# Patient Record
Sex: Male | Born: 1935
Health system: Southern US, Community
[De-identification: ages and names within clinical notes are randomized; demographics above are authoritative.]

## PROBLEM LIST (undated history)

## (undated) DIAGNOSIS — M545 Low back pain, unspecified: Secondary | ICD-10-CM

## (undated) DIAGNOSIS — C61 Malignant neoplasm of prostate: Secondary | ICD-10-CM

## (undated) DIAGNOSIS — C801 Malignant (primary) neoplasm, unspecified: Secondary | ICD-10-CM

## (undated) DIAGNOSIS — J8410 Pulmonary fibrosis, unspecified: Secondary | ICD-10-CM

## (undated) DIAGNOSIS — K219 Gastro-esophageal reflux disease without esophagitis: Secondary | ICD-10-CM

## (undated) DIAGNOSIS — K635 Polyp of colon: Secondary | ICD-10-CM

## (undated) DIAGNOSIS — N183 Chronic kidney disease, stage 3 unspecified: Secondary | ICD-10-CM

## (undated) DIAGNOSIS — J449 Chronic obstructive pulmonary disease, unspecified: Secondary | ICD-10-CM

## (undated) DIAGNOSIS — G8929 Other chronic pain: Secondary | ICD-10-CM

## (undated) DIAGNOSIS — Z8719 Personal history of other diseases of the digestive system: Secondary | ICD-10-CM

## (undated) DIAGNOSIS — I10 Essential (primary) hypertension: Secondary | ICD-10-CM

## (undated) DIAGNOSIS — E785 Hyperlipidemia, unspecified: Secondary | ICD-10-CM

## (undated) DIAGNOSIS — I2510 Atherosclerotic heart disease of native coronary artery without angina pectoris: Secondary | ICD-10-CM

## (undated) DIAGNOSIS — M1990 Unspecified osteoarthritis, unspecified site: Secondary | ICD-10-CM

## (undated) HISTORY — PX: CARDIAC CATHETERIZATION: SHX172

## (undated) HISTORY — PX: JOINT REPLACEMENT: SHX530

## (undated) HISTORY — PX: TONSILLECTOMY: SUR1361

## (undated) HISTORY — PX: MOHS SURGERY: SUR867

---

## 2000-02-11 ENCOUNTER — Encounter: Payer: Self-pay | Admitting: Orthopaedic Surgery

## 2000-02-11 ENCOUNTER — Encounter: Admission: RE | Admit: 2000-02-11 | Discharge: 2000-02-11 | Payer: Self-pay | Admitting: Orthopaedic Surgery

## 2000-05-28 ENCOUNTER — Ambulatory Visit (HOSPITAL_COMMUNITY): Admission: RE | Admit: 2000-05-28 | Discharge: 2000-05-28 | Payer: Self-pay | Admitting: Gastroenterology

## 2005-10-29 ENCOUNTER — Encounter: Admission: RE | Admit: 2005-10-29 | Discharge: 2005-10-29 | Payer: Self-pay | Admitting: Orthopaedic Surgery

## 2005-10-29 ENCOUNTER — Ambulatory Visit (HOSPITAL_COMMUNITY): Admission: RE | Admit: 2005-10-29 | Discharge: 2005-10-29 | Payer: Self-pay | Admitting: Gastroenterology

## 2005-12-21 ENCOUNTER — Encounter: Admission: RE | Admit: 2005-12-21 | Discharge: 2005-12-21 | Payer: Self-pay | Admitting: Dermatology

## 2011-02-19 DIAGNOSIS — M169 Osteoarthritis of hip, unspecified: Secondary | ICD-10-CM | POA: Diagnosis not present

## 2011-02-19 DIAGNOSIS — M25559 Pain in unspecified hip: Secondary | ICD-10-CM | POA: Diagnosis not present

## 2011-03-17 DIAGNOSIS — Z6827 Body mass index (BMI) 27.0-27.9, adult: Secondary | ICD-10-CM | POA: Diagnosis not present

## 2011-03-17 DIAGNOSIS — M1909 Primary osteoarthritis, other specified site: Secondary | ICD-10-CM | POA: Diagnosis not present

## 2011-03-31 DIAGNOSIS — K5732 Diverticulitis of large intestine without perforation or abscess without bleeding: Secondary | ICD-10-CM | POA: Diagnosis not present

## 2011-03-31 DIAGNOSIS — E875 Hyperkalemia: Secondary | ICD-10-CM | POA: Diagnosis not present

## 2011-03-31 DIAGNOSIS — I10 Essential (primary) hypertension: Secondary | ICD-10-CM | POA: Diagnosis not present

## 2011-03-31 DIAGNOSIS — Z7901 Long term (current) use of anticoagulants: Secondary | ICD-10-CM | POA: Diagnosis not present

## 2011-03-31 DIAGNOSIS — J449 Chronic obstructive pulmonary disease, unspecified: Secondary | ICD-10-CM | POA: Diagnosis not present

## 2011-03-31 DIAGNOSIS — E785 Hyperlipidemia, unspecified: Secondary | ICD-10-CM | POA: Diagnosis not present

## 2011-03-31 DIAGNOSIS — N402 Nodular prostate without lower urinary tract symptoms: Secondary | ICD-10-CM | POA: Diagnosis not present

## 2011-04-07 DIAGNOSIS — R319 Hematuria, unspecified: Secondary | ICD-10-CM | POA: Diagnosis not present

## 2011-04-07 DIAGNOSIS — J449 Chronic obstructive pulmonary disease, unspecified: Secondary | ICD-10-CM | POA: Diagnosis not present

## 2011-04-07 DIAGNOSIS — Z6826 Body mass index (BMI) 26.0-26.9, adult: Secondary | ICD-10-CM | POA: Diagnosis not present

## 2011-04-07 DIAGNOSIS — E785 Hyperlipidemia, unspecified: Secondary | ICD-10-CM | POA: Diagnosis not present

## 2011-04-07 DIAGNOSIS — I10 Essential (primary) hypertension: Secondary | ICD-10-CM | POA: Diagnosis not present

## 2011-04-07 DIAGNOSIS — M1909 Primary osteoarthritis, other specified site: Secondary | ICD-10-CM | POA: Diagnosis not present

## 2011-04-30 DIAGNOSIS — L259 Unspecified contact dermatitis, unspecified cause: Secondary | ICD-10-CM | POA: Diagnosis not present

## 2011-04-30 DIAGNOSIS — L821 Other seborrheic keratosis: Secondary | ICD-10-CM | POA: Diagnosis not present

## 2011-04-30 DIAGNOSIS — Z85828 Personal history of other malignant neoplasm of skin: Secondary | ICD-10-CM | POA: Diagnosis not present

## 2011-04-30 DIAGNOSIS — D485 Neoplasm of uncertain behavior of skin: Secondary | ICD-10-CM | POA: Diagnosis not present

## 2011-04-30 DIAGNOSIS — D044 Carcinoma in situ of skin of scalp and neck: Secondary | ICD-10-CM | POA: Diagnosis not present

## 2011-04-30 DIAGNOSIS — L570 Actinic keratosis: Secondary | ICD-10-CM | POA: Diagnosis not present

## 2011-04-30 DIAGNOSIS — D0470 Carcinoma in situ of skin of unspecified lower limb, including hip: Secondary | ICD-10-CM | POA: Diagnosis not present

## 2011-05-01 DIAGNOSIS — M1610 Unilateral primary osteoarthritis, unspecified hip: Secondary | ICD-10-CM | POA: Diagnosis not present

## 2011-05-01 DIAGNOSIS — I10 Essential (primary) hypertension: Secondary | ICD-10-CM | POA: Diagnosis present

## 2011-05-01 DIAGNOSIS — J449 Chronic obstructive pulmonary disease, unspecified: Secondary | ICD-10-CM | POA: Insufficient documentation

## 2011-05-01 DIAGNOSIS — M169 Osteoarthritis of hip, unspecified: Secondary | ICD-10-CM | POA: Diagnosis present

## 2011-05-01 DIAGNOSIS — N189 Chronic kidney disease, unspecified: Secondary | ICD-10-CM | POA: Diagnosis present

## 2011-05-01 DIAGNOSIS — C44300 Unspecified malignant neoplasm of skin of unspecified part of face: Secondary | ICD-10-CM | POA: Diagnosis not present

## 2011-05-12 ENCOUNTER — Encounter (HOSPITAL_COMMUNITY): Payer: Self-pay | Admitting: Pharmacy Technician

## 2011-05-13 ENCOUNTER — Other Ambulatory Visit (HOSPITAL_COMMUNITY): Payer: Self-pay

## 2011-05-22 NOTE — Pre-Procedure Instructions (Addendum)
20 Marc Gilbert  05/22/2011   Your procedure is scheduled on:  April 9 @ 0730  Report to [facility] at 0530 AM.  Call this number if you have problems the morning of surgery: [phone]   Remember:   Do not eat food:After Midnight.  May have clear liquids: up to 4 Hours before arrival.130 am  Clear liquids include soda, tea, black coffee, apple or grape juice, broth.  Take these medicines the morning of surgery with A SIP OF WATER tamulosin, tramadol  STOP aspirin, fish oil, multi vit, naproxen now   Do not wear jewelry, make-up or nail polish.  Do not wear lotions, powders, or perfumes. You may wear deodorant.  Do not shave 48 hours prior to surgery.  Do not bring valuables to the hospital.  Contacts, dentures or bridgework may not be worn into surgery.  Leave suitcase in the car. After surgery it may be brought to your room.  For patients admitted to the hospital, checkout time is 11:00 AM the day of discharge.   Patients discharged the day of surgery will not be allowed to drive home.  Name and phone number of your driver: [name] [phone]  Special Instructions: CHG Shower Use Special Wash: 1/2 bottle night before surgery and 1/2 bottle morning of surgery.   Please read over the following fact sheets that you were given: Pain Booklet, Coughing and Deep Breathing, Blood Transfusion Information, Total Joint Packet, MRSA Information and Surgical Site Infection Prevention

## 2011-05-25 ENCOUNTER — Encounter (HOSPITAL_COMMUNITY): Payer: Self-pay

## 2011-05-25 ENCOUNTER — Encounter (HOSPITAL_COMMUNITY)
Admission: RE | Admit: 2011-05-25 | Discharge: 2011-05-25 | Disposition: A | Discharge status: Home / Self Care | Payer: Medicare Other | Source: Ambulatory Visit | Attending: Orthopaedic Surgery | Admitting: Orthopaedic Surgery

## 2011-05-25 HISTORY — DX: Malignant (primary) neoplasm, unspecified: C80.1

## 2011-05-25 HISTORY — DX: Gastro-esophageal reflux disease without esophagitis: K21.9

## 2011-05-25 HISTORY — DX: Personal history of other diseases of the digestive system: Z87.19

## 2011-05-25 HISTORY — DX: Chronic obstructive pulmonary disease, unspecified: J44.9

## 2011-05-25 HISTORY — DX: Unspecified osteoarthritis, unspecified site: M19.90

## 2011-05-25 LAB — TYPE AND SCREEN
ABO/RH(D): A POS
Antibody Screen: NEGATIVE

## 2011-05-25 LAB — DIFFERENTIAL
Basophils Absolute: 0.1 10*3/uL (ref 0.0–0.1)
Basophils Relative: 1 % (ref 0–1)
Eosinophils Absolute: 0.1 10*3/uL (ref 0.0–0.7)
Eosinophils Relative: 2 % (ref 0–5)
Lymphocytes Relative: 29 % (ref 12–46)
Lymphs Abs: 1.4 10*3/uL (ref 0.7–4.0)
Monocytes Absolute: 0.7 10*3/uL (ref 0.1–1.0)
Monocytes Relative: 14 % — ABNORMAL HIGH (ref 3–12)
Neutro Abs: 2.6 10*3/uL (ref 1.7–7.7)
Neutrophils Relative %: 53 % (ref 43–77)

## 2011-05-25 LAB — URINALYSIS, ROUTINE W REFLEX MICROSCOPIC
Bilirubin Urine: NEGATIVE
Glucose, UA: NEGATIVE mg/dL
Hgb urine dipstick: NEGATIVE
Ketones, ur: NEGATIVE mg/dL
Leukocytes, UA: NEGATIVE
Nitrite: NEGATIVE
Protein, ur: NEGATIVE mg/dL
Specific Gravity, Urine: 1.021 (ref 1.005–1.030)
Urobilinogen, UA: 0.2 mg/dL (ref 0.0–1.0)
pH: 5.5 (ref 5.0–8.0)

## 2011-05-25 LAB — COMPREHENSIVE METABOLIC PANEL
ALT: 20 U/L (ref 0–53)
AST: 20 U/L (ref 0–37)
Albumin: 3.8 g/dL (ref 3.5–5.2)
Alkaline Phosphatase: 58 U/L (ref 39–117)
BUN: 23 mg/dL (ref 6–23)
CO2: 28 mEq/L (ref 19–32)
Calcium: 9.7 mg/dL (ref 8.4–10.5)
Chloride: 104 mEq/L (ref 96–112)
Creatinine, Ser: 1.26 mg/dL (ref 0.50–1.35)
GFR calc Af Amer: 61 mL/min — ABNORMAL LOW (ref >90)
GFR calc non Af Amer: 53 mL/min — ABNORMAL LOW (ref >90)
Glucose, Bld: 99 mg/dL (ref 70–99)
Potassium: 4.7 mEq/L (ref 3.5–5.1)
Sodium: 140 mEq/L (ref 135–145)
Total Bilirubin: 0.3 mg/dL (ref 0.3–1.2)
Total Protein: 7.4 g/dL (ref 6.0–8.3)

## 2011-05-25 LAB — CBC
HCT: 44.3 % (ref 39.0–52.0)
Hemoglobin: 15 g/dL (ref 13.0–17.0)
MCH: 30 pg (ref 26.0–34.0)
MCHC: 33.9 g/dL (ref 30.0–36.0)
MCV: 88.6 fL (ref 78.0–100.0)
Platelets: 147 10*3/uL — ABNORMAL LOW (ref 150–400)
RBC: 5 MIL/uL (ref 4.22–5.81)
RDW: 13.1 % (ref 11.5–15.5)
WBC: 4.9 10*3/uL (ref 4.0–10.5)

## 2011-05-25 LAB — APTT: aPTT: 29 seconds (ref 24–37)

## 2011-05-25 LAB — SURGICAL PCR SCREEN
MRSA, PCR: NEGATIVE
Staphylococcus aureus: NEGATIVE

## 2011-05-25 LAB — ABO/RH: ABO/RH(D): A POS

## 2011-05-25 LAB — PROTIME-INR
INR: 1.06 (ref 0.00–1.49)
Prothrombin Time: 14 seconds (ref 11.6–15.2)

## 2011-05-25 MED ORDER — CHLORHEXIDINE GLUCONATE 4 % EX LIQD: 60.0000 mL | Freq: Once | CUTANEOUS | Status: DC

## 2011-05-25 MED ORDER — ACETAMINOPHEN 10 MG/ML IV SOLN
1000.0000 mg | Freq: Once | INTRAVENOUS | Status: DC
  Filled 2011-05-25: qty 100

## 2011-05-25 MED ORDER — CHLORHEXIDINE GLUCONATE 4 % EX LIQD: 60.0000 mL | Freq: Every day | CUTANEOUS | Status: DC

## 2011-05-25 NOTE — H&P (Signed)
COMPLAINT:  Painful right hip.  HPI:  Marc Gilbert is a very pleasant 78 year old white male who is seen today for evaluation of his right hip.  He has been noted to have a deformed femoral head and considerable degenerative arthritis of the right hip in the past.  It has been progressive to the point now where he needs to use a cane for ambulation.  He is now starting to develop even some back pain secondary to his limp.  He is able to sit and is somewhat comfortable.  He does have difficulty with ambulation as well as sleeping and certainly difficulty with riding in a car.  He states his pain is constant, severe, and sharp as well as with a dull ache.  He has noticed weakness secondary to the pain.  He does have occasional problems with sleep.  Rest has been helpful.  He has been using tramadol 50 mg t.i.d. for his pain.  He is seen today for reevaluation.  MEDICATIONS:  Pravachol 40 mg daily, losartan 12.5 mg daily, tamsulosin 0.4 mg daily, and tramadol 50 mg t.i.d.  He does take an 81 mg aspirin daily as well as a multivitamin and fish oil 1,200 t.i.d.  ALLERGIES:  NKDA.  PAST MEDICAL HISTORY:   Reveals three Mohs procedures for carcinoma of the skin.  He had squamous as well as basal cell.  He had the face done in 2009 and the left ankle done in 2011 and 2012.    ROS:  Fourteen point review of systems is positive for the use of glasses as well as bilateral decreased hearing.  He has been given the diagnosis of COPD.  He states, though, that he does not have that much of a shortness of breath.  He has also had a history of hypertension for three to four years and is presently on medication, that of 12.5 mg losartan.  He does have history of hemorrhoids without hematochezia.  He does have easy bruising probably secondary to his aspirin.  Again he has had basal and squamous cell skin cancer in the past.  He has seen a nephrologist because of increase in his creatinine.  It is now borderline increased at 1.5.   This had been over the last 2-1/2 to 3 years.  He does have frequent urination during the day and also nocturia two to three times per evening.  FAMILY HISTORY:  Mother died age 58 from incarcerated hernia.  Father died age 35 from a  myriad of problems including that of diabetes mellitus, COPD, and renal failure.  He has no siblings.  SOCIAL HISTORY:  He is a 27 year old white married male who is retired from [employer].  He quit smoking cigarettes in 1990 and prior to that he was smoking less than a pack a day for 36 years.  He states that he does drink daily, one to two drinks a day of beer, wine, or whiskey.  EXAM:  Examination today reveals a 78 year old white male who is well developed, well nourished, alert, pleasant, and cooperative.  He is in moderate distress secondary to right hip and groin pain.  HT:  6'0"     WT:  190 pounds  BMI:  25.8   TEMP:  98.2 degrees     BP:  135/90     Heart rate:  78     R:  16  Head normocephalic.   Eyes PERRLA.  EOMs intact.   Ears, nose and throat were benign. Neck supple,  no carotid bruits are noted.   Chest had good expansion.   Lungs had good breath sounds bilaterally. Cardiac had regular rhythm and rate, normal S1-S2, no murmurs noted. Abdomen scaphoid soft, nontender, no masses palpable, normal bowel sounds present. Genital, rectal, and breast exam not indicated for the orthopedic procedure. CNS oriented x 2, cranial nerves 2-12 grossly intact. Musculoskeletal reveals very limited range of motion of the right hip, especially with that of internal and external rotation.  I am able to get him to about 100 degrees of flexion, but he externally rotates to get to that position.  He is neurovascularly intact distally.  Calf is supple and nontender.  He does have good dorsalis pedis pulse at 1+.  IMPRESSION:   1. Osteoarthritis end-stage, right hip. 2. History of hypertension. 3. History of COPD. 4. History of hemorrhoids. 5. History of increased  creatinine levels. 6. Nocturia. 7. History of basal and squamous cell skin carcinoma.  RECOMMENDATIONS:      1. At this time, I have reviewed chart and paperwork from his physicians in [place] and it is their consensus that he is able to proceed with total hip replacement on the right. 2. Therefore, our plan is to schedule for a right total hip arthroplasty.  The procedure risks and benefits have been fully explained to him and all questions answered.  I discussed the hospital course with him.  We also know that he will need to consider skilled nursing facility postop.  He is attuned to all these.  Oris Drone Santiago Bumpers, PA-C 05/25/2011 8:24 PM

## 2011-05-25 NOTE — Progress Notes (Addendum)
Dr dalstedt urologist here in [place], dr Ezzard Standing in [place], dr Meriel Flavors in [place]  [phone] notes on chart from dr Sandi Carne ekg cxr on chart

## 2011-05-26 ENCOUNTER — Encounter (HOSPITAL_COMMUNITY): Payer: Self-pay | Admitting: *Deleted

## 2011-05-26 ENCOUNTER — Inpatient Hospital Stay (HOSPITAL_COMMUNITY)
Admission: RE | Admit: 2011-05-26 | Discharge: 2011-05-29 | DRG: 470 | Disposition: A | Discharge status: Skilled Nursing Facility | Payer: Medicare Other | Source: Ambulatory Visit | Attending: Orthopaedic Surgery | Admitting: Orthopaedic Surgery

## 2011-05-26 ENCOUNTER — Ambulatory Visit (HOSPITAL_COMMUNITY): Payer: Medicare Other

## 2011-05-26 ENCOUNTER — Ambulatory Visit (HOSPITAL_COMMUNITY): Payer: Medicare Other | Admitting: *Deleted

## 2011-05-26 ENCOUNTER — Encounter (HOSPITAL_COMMUNITY)
Admission: RE | Disposition: A | Discharge status: Skilled Nursing Facility | Payer: Self-pay | Source: Ambulatory Visit | Attending: Orthopaedic Surgery

## 2011-05-26 DIAGNOSIS — D62 Acute posthemorrhagic anemia: Secondary | ICD-10-CM | POA: Diagnosis not present

## 2011-05-26 DIAGNOSIS — I10 Essential (primary) hypertension: Secondary | ICD-10-CM | POA: Diagnosis present

## 2011-05-26 DIAGNOSIS — K219 Gastro-esophageal reflux disease without esophagitis: Secondary | ICD-10-CM | POA: Diagnosis not present

## 2011-05-26 DIAGNOSIS — Z01812 Encounter for preprocedural laboratory examination: Secondary | ICD-10-CM | POA: Diagnosis not present

## 2011-05-26 DIAGNOSIS — M1610 Unilateral primary osteoarthritis, unspecified hip: Secondary | ICD-10-CM | POA: Diagnosis not present

## 2011-05-26 DIAGNOSIS — C44300 Unspecified malignant neoplasm of skin of unspecified part of face: Secondary | ICD-10-CM | POA: Diagnosis not present

## 2011-05-26 DIAGNOSIS — Z87891 Personal history of nicotine dependence: Secondary | ICD-10-CM

## 2011-05-26 DIAGNOSIS — K5903 Drug induced constipation: Secondary | ICD-10-CM | POA: Diagnosis not present

## 2011-05-26 DIAGNOSIS — Z79899 Other long term (current) drug therapy: Secondary | ICD-10-CM | POA: Diagnosis not present

## 2011-05-26 DIAGNOSIS — Z85828 Personal history of other malignant neoplasm of skin: Secondary | ICD-10-CM | POA: Diagnosis not present

## 2011-05-26 DIAGNOSIS — M169 Osteoarthritis of hip, unspecified: Secondary | ICD-10-CM | POA: Diagnosis present

## 2011-05-26 DIAGNOSIS — Z5189 Encounter for other specified aftercare: Secondary | ICD-10-CM | POA: Diagnosis not present

## 2011-05-26 DIAGNOSIS — J449 Chronic obstructive pulmonary disease, unspecified: Secondary | ICD-10-CM | POA: Diagnosis not present

## 2011-05-26 DIAGNOSIS — T40605A Adverse effect of unspecified narcotics, initial encounter: Secondary | ICD-10-CM | POA: Diagnosis not present

## 2011-05-26 DIAGNOSIS — K5909 Other constipation: Secondary | ICD-10-CM | POA: Diagnosis not present

## 2011-05-26 DIAGNOSIS — Z96649 Presence of unspecified artificial hip joint: Secondary | ICD-10-CM | POA: Diagnosis not present

## 2011-05-26 DIAGNOSIS — I129 Hypertensive chronic kidney disease with stage 1 through stage 4 chronic kidney disease, or unspecified chronic kidney disease: Secondary | ICD-10-CM | POA: Diagnosis present

## 2011-05-26 DIAGNOSIS — M1990 Unspecified osteoarthritis, unspecified site: Secondary | ICD-10-CM | POA: Diagnosis not present

## 2011-05-26 DIAGNOSIS — Z7982 Long term (current) use of aspirin: Secondary | ICD-10-CM

## 2011-05-26 DIAGNOSIS — Z471 Aftercare following joint replacement surgery: Secondary | ICD-10-CM | POA: Diagnosis not present

## 2011-05-26 DIAGNOSIS — E785 Hyperlipidemia, unspecified: Secondary | ICD-10-CM | POA: Diagnosis not present

## 2011-05-26 DIAGNOSIS — M25559 Pain in unspecified hip: Secondary | ICD-10-CM | POA: Diagnosis not present

## 2011-05-26 DIAGNOSIS — J4489 Other specified chronic obstructive pulmonary disease: Secondary | ICD-10-CM | POA: Diagnosis not present

## 2011-05-26 DIAGNOSIS — N189 Chronic kidney disease, unspecified: Secondary | ICD-10-CM | POA: Diagnosis present

## 2011-05-26 DIAGNOSIS — M129 Arthropathy, unspecified: Secondary | ICD-10-CM | POA: Diagnosis not present

## 2011-05-26 HISTORY — PX: TOTAL HIP ARTHROPLASTY: SHX124

## 2011-05-26 LAB — BASIC METABOLIC PANEL
BUN: 22 mg/dL (ref 6–23)
CO2: 23 mEq/L (ref 19–32)
Calcium: 8 mg/dL — ABNORMAL LOW (ref 8.4–10.5)
Chloride: 108 mEq/L (ref 96–112)
Creatinine, Ser: 1.1 mg/dL (ref 0.50–1.35)
GFR calc Af Amer: 72 mL/min — ABNORMAL LOW (ref >90)
GFR calc non Af Amer: 62 mL/min — ABNORMAL LOW (ref >90)
Glucose, Bld: 165 mg/dL — ABNORMAL HIGH (ref 70–99)
Potassium: 5 mEq/L (ref 3.5–5.1)
Sodium: 138 mEq/L (ref 135–145)

## 2011-05-26 LAB — CBC
HCT: 32.3 % — ABNORMAL LOW (ref 39.0–52.0)
Hemoglobin: 10.9 g/dL — ABNORMAL LOW (ref 13.0–17.0)
MCH: 29.8 pg (ref 26.0–34.0)
MCHC: 33.7 g/dL (ref 30.0–36.0)
MCV: 88.3 fL (ref 78.0–100.0)
Platelets: 131 10*3/uL — ABNORMAL LOW (ref 150–400)
RBC: 3.66 MIL/uL — ABNORMAL LOW (ref 4.22–5.81)
RDW: 12.9 % (ref 11.5–15.5)
WBC: 6.4 10*3/uL (ref 4.0–10.5)

## 2011-05-26 SURGERY — ARTHROPLASTY, HIP, TOTAL,POSTERIOR APPROACH
Anesthesia: General | Site: Hip | Laterality: Right | Wound class: Clean

## 2011-05-26 MED ORDER — CEFAZOLIN SODIUM 1-5 GM-% IV SOLN
INTRAVENOUS | Status: AC
  Filled 2011-05-26: qty 100

## 2011-05-26 MED ORDER — METHOCARBAMOL 500 MG PO TABS
500.0000 mg | ORAL_TABLET | Freq: Four times a day (QID) | ORAL | Status: DC | PRN
  Administered 2011-05-27: 500 mg via ORAL
  Filled 2011-05-26: qty 1

## 2011-05-26 MED ORDER — METHOCARBAMOL 100 MG/ML IJ SOLN
500.0000 mg | INTRAVENOUS | Status: AC
  Administered 2011-05-26: 500 mg via INTRAVENOUS
  Filled 2011-05-26: qty 5

## 2011-05-26 MED ORDER — SODIUM CHLORIDE 0.9 % IR SOLN
Status: DC | PRN
  Administered 2011-05-26: 1000 mL

## 2011-05-26 MED ORDER — CEFAZOLIN SODIUM 1-5 GM-% IV SOLN
INTRAVENOUS | Status: DC | PRN
  Administered 2011-05-26: 2 g via INTRAVENOUS

## 2011-05-26 MED ORDER — ONDANSETRON HCL 4 MG/2ML IJ SOLN: 4.0000 mg | Freq: Four times a day (QID) | INTRAMUSCULAR | Status: DC | PRN

## 2011-05-26 MED ORDER — KETOROLAC TROMETHAMINE 15 MG/ML IJ SOLN: 15.0000 mg | Freq: Four times a day (QID) | INTRAMUSCULAR | Status: DC

## 2011-05-26 MED ORDER — LOSARTAN POTASSIUM 50 MG PO TABS
50.0000 mg | ORAL_TABLET | Freq: Every day | ORAL | Status: DC
  Administered 2011-05-28: 50 mg via ORAL
  Filled 2011-05-26 (×4): qty 1

## 2011-05-26 MED ORDER — MENTHOL 3 MG MT LOZG: 1.0000 | LOZENGE | OROMUCOSAL | Status: DC | PRN

## 2011-05-26 MED ORDER — DOCUSATE SODIUM 100 MG PO CAPS
100.0000 mg | ORAL_CAPSULE | Freq: Two times a day (BID) | ORAL | Status: DC
  Administered 2011-05-26 – 2011-05-29 (×7): 100 mg via ORAL
  Filled 2011-05-26 (×7): qty 1

## 2011-05-26 MED ORDER — ONDANSETRON HCL 4 MG/2ML IJ SOLN
INTRAMUSCULAR | Status: DC | PRN
  Administered 2011-05-26 (×2): 2 mg via INTRAVENOUS

## 2011-05-26 MED ORDER — CEFAZOLIN SODIUM 1-5 GM-% IV SOLN
1.0000 g | Freq: Four times a day (QID) | INTRAVENOUS | Status: AC
  Administered 2011-05-26 – 2011-05-27 (×3): 1 g via INTRAVENOUS
  Filled 2011-05-26 (×3): qty 50

## 2011-05-26 MED ORDER — RIVAROXABAN 10 MG PO TABS
10.0000 mg | ORAL_TABLET | ORAL | Status: DC
  Administered 2011-05-26 – 2011-05-28 (×3): 10 mg via ORAL
  Filled 2011-05-26 (×4): qty 1

## 2011-05-26 MED ORDER — ACETAMINOPHEN 10 MG/ML IV SOLN
1000.0000 mg | Freq: Four times a day (QID) | INTRAVENOUS | Status: AC
  Administered 2011-05-26 – 2011-05-27 (×4): 1000 mg via INTRAVENOUS
  Filled 2011-05-26 (×4): qty 100

## 2011-05-26 MED ORDER — PROPOFOL 10 MG/ML IV EMUL
INTRAVENOUS | Status: DC | PRN
  Administered 2011-05-26: 150 mg via INTRAVENOUS
  Administered 2011-05-26: 25 mg via INTRAVENOUS

## 2011-05-26 MED ORDER — PROMETHAZINE HCL 25 MG/ML IJ SOLN: 6.2500 mg | INTRAMUSCULAR | Status: DC | PRN

## 2011-05-26 MED ORDER — MEPERIDINE HCL 25 MG/ML IJ SOLN: 6.2500 mg | INTRAMUSCULAR | Status: DC | PRN

## 2011-05-26 MED ORDER — TAMSULOSIN HCL 0.4 MG PO CAPS
0.4000 mg | ORAL_CAPSULE | Freq: Every day | ORAL | Status: DC
  Administered 2011-05-26: 0.4 mg via ORAL
  Filled 2011-05-26 (×2): qty 1

## 2011-05-26 MED ORDER — DEXAMETHASONE SODIUM PHOSPHATE 4 MG/ML IJ SOLN
INTRAMUSCULAR | Status: DC | PRN
  Administered 2011-05-26: 4 mg via INTRAVENOUS

## 2011-05-26 MED ORDER — FENTANYL CITRATE 0.05 MG/ML IJ SOLN
INTRAMUSCULAR | Status: DC | PRN
  Administered 2011-05-26 (×3): 50 ug via INTRAVENOUS
  Administered 2011-05-26 (×2): 100 ug via INTRAVENOUS

## 2011-05-26 MED ORDER — ONDANSETRON HCL 4 MG/2ML IJ SOLN
INTRAMUSCULAR | Status: DC | PRN
  Administered 2011-05-26: 4 mg via INTRAVENOUS

## 2011-05-26 MED ORDER — HETASTARCH-ELECTROLYTES 6 % IV SOLN
INTRAVENOUS | Status: DC | PRN
  Administered 2011-05-26: 09:00:00 via INTRAVENOUS

## 2011-05-26 MED ORDER — MIDAZOLAM HCL 5 MG/5ML IJ SOLN
INTRAMUSCULAR | Status: DC | PRN
  Administered 2011-05-26: 1 mg via INTRAVENOUS

## 2011-05-26 MED ORDER — PHENOL 1.4 % MT LIQD: 1.0000 | OROMUCOSAL | Status: DC | PRN

## 2011-05-26 MED ORDER — HYDROMORPHONE HCL PF 1 MG/ML IJ SOLN
0.2500 mg | INTRAMUSCULAR | Status: DC | PRN
  Administered 2011-05-26 (×3): 0.5 mg via INTRAVENOUS

## 2011-05-26 MED ORDER — LACTATED RINGERS IV SOLN
INTRAVENOUS | Status: DC | PRN
  Administered 2011-05-26 (×4): via INTRAVENOUS

## 2011-05-26 MED ORDER — ONDANSETRON HCL 4 MG PO TABS: 4.0000 mg | ORAL_TABLET | Freq: Four times a day (QID) | ORAL | Status: DC | PRN

## 2011-05-26 MED ORDER — OXYCODONE HCL 5 MG PO TABS
5.0000 mg | ORAL_TABLET | ORAL | Status: DC | PRN
  Administered 2011-05-26 – 2011-05-29 (×8): 10 mg via ORAL
  Filled 2011-05-26 (×8): qty 2

## 2011-05-26 MED ORDER — EPHEDRINE SULFATE 50 MG/ML IJ SOLN
INTRAMUSCULAR | Status: DC | PRN
  Administered 2011-05-26 (×2): 5 mg via INTRAVENOUS

## 2011-05-26 MED ORDER — GLYCOPYRROLATE 0.2 MG/ML IJ SOLN
INTRAMUSCULAR | Status: DC | PRN
  Administered 2011-05-26: 0.4 mg via INTRAVENOUS

## 2011-05-26 MED ORDER — BUPIVACAINE-EPINEPHRINE PF 0.25-1:200000 % IJ SOLN
INTRAMUSCULAR | Status: DC | PRN
  Administered 2011-05-26: 20 mL

## 2011-05-26 MED ORDER — NEOSTIGMINE METHYLSULFATE 1 MG/ML IJ SOLN
INTRAMUSCULAR | Status: DC | PRN
  Administered 2011-05-26: 3 mg via INTRAVENOUS

## 2011-05-26 MED ORDER — ROCURONIUM BROMIDE 100 MG/10ML IV SOLN
INTRAVENOUS | Status: DC | PRN
  Administered 2011-05-26: 10 mg via INTRAVENOUS
  Administered 2011-05-26: 50 mg via INTRAVENOUS
  Administered 2011-05-26: 10 mg via INTRAVENOUS

## 2011-05-26 MED ORDER — ACETAMINOPHEN 10 MG/ML IV SOLN
INTRAVENOUS | Status: DC | PRN
  Administered 2011-05-26: 1000 mg via INTRAVENOUS

## 2011-05-26 MED ORDER — HYDROCHLOROTHIAZIDE 12.5 MG PO CAPS
12.5000 mg | ORAL_CAPSULE | Freq: Every day | ORAL | Status: DC
  Administered 2011-05-28: 12.5 mg via ORAL
  Filled 2011-05-26 (×4): qty 1

## 2011-05-26 MED ORDER — DEXTROSE 5 % IV SOLN
500.0000 mg | Freq: Four times a day (QID) | INTRAVENOUS | Status: DC | PRN
  Filled 2011-05-26: qty 5

## 2011-05-26 MED ORDER — LOSARTAN POTASSIUM-HCTZ 100-25 MG PO TABS: 0.5000 | ORAL_TABLET | Freq: Every day | ORAL | Status: DC

## 2011-05-26 MED ORDER — HYDROMORPHONE HCL PF 1 MG/ML IJ SOLN
0.5000 mg | INTRAMUSCULAR | Status: DC | PRN
  Administered 2011-05-26: 1 mg via INTRAVENOUS
  Filled 2011-05-26: qty 1

## 2011-05-26 MED ORDER — SODIUM CHLORIDE 0.9 % IV SOLN: INTRAVENOUS | Status: DC

## 2011-05-26 MED ORDER — METOCLOPRAMIDE HCL 5 MG/ML IJ SOLN: 5.0000 mg | Freq: Three times a day (TID) | INTRAMUSCULAR | Status: DC | PRN

## 2011-05-26 MED ORDER — PHENYLEPHRINE HCL 10 MG/ML IJ SOLN
INTRAMUSCULAR | Status: DC | PRN
  Administered 2011-05-26: 20 ug via INTRAVENOUS
  Administered 2011-05-26: 40 ug via INTRAVENOUS

## 2011-05-26 MED ORDER — SODIUM CHLORIDE 0.9 % IV SOLN
75.0000 mL/h | INTRAVENOUS | Status: DC
  Administered 2011-05-26: 75 mL/h via INTRAVENOUS

## 2011-05-26 MED ORDER — ACETAMINOPHEN 10 MG/ML IV SOLN
INTRAVENOUS | Status: AC
  Filled 2011-05-26: qty 100

## 2011-05-26 MED ORDER — METOCLOPRAMIDE HCL 10 MG PO TABS: 5.0000 mg | ORAL_TABLET | Freq: Three times a day (TID) | ORAL | Status: DC | PRN

## 2011-05-26 SURGICAL SUPPLY — 56 items
BLADE SAW SAG 73X25 THK (BLADE) ×1
BLADE SAW SGTL 73X25 THK (BLADE) ×1 IMPLANT
BRUSH FEMORAL CANAL (MISCELLANEOUS) IMPLANT
CLOTH BEACON ORANGE TIMEOUT ST (SAFETY) ×2 IMPLANT
COVER BACK TABLE 24X17X13 BIG (DRAPES) ×2 IMPLANT
COVER SURGICAL LIGHT HANDLE (MISCELLANEOUS) ×2 IMPLANT
DRAPE INCISE IOBAN 66X45 STRL (DRAPES) ×4 IMPLANT
DRAPE ORTHO SPLIT 77X108 STRL (DRAPES) ×2
DRAPE SURG ORHT 6 SPLT 77X108 (DRAPES) ×2 IMPLANT
DRSG MEPILEX BORDER 4X12 (GAUZE/BANDAGES/DRESSINGS) ×2 IMPLANT
DRSG MEPILEX BORDER 4X8 (GAUZE/BANDAGES/DRESSINGS) ×2 IMPLANT
DURAPREP 26ML APPLICATOR (WOUND CARE) ×2 IMPLANT
ELECT BLADE 6.5 EXT (BLADE) IMPLANT
ELECT REM PT RETURN 9FT ADLT (ELECTROSURGICAL) ×2
ELECTRODE REM PT RTRN 9FT ADLT (ELECTROSURGICAL) ×1 IMPLANT
EVACUATOR 1/8 PVC DRAIN (DRAIN) ×2 IMPLANT
FACESHIELD LNG OPTICON STERILE (SAFETY) ×8 IMPLANT
GLOVE BIOGEL PI IND STRL 8 (GLOVE) ×1 IMPLANT
GLOVE BIOGEL PI IND STRL 8.5 (GLOVE) ×1 IMPLANT
GLOVE BIOGEL PI INDICATOR 8 (GLOVE) ×1
GLOVE BIOGEL PI INDICATOR 8.5 (GLOVE) ×1
GLOVE ECLIPSE 8.0 STRL XLNG CF (GLOVE) ×6 IMPLANT
GLOVE SURG ORTHO 8.5 STRL (GLOVE) ×6 IMPLANT
GOWN PREVENTION PLUS XLARGE (GOWN DISPOSABLE) ×4 IMPLANT
GOWN STRL NON-REIN LRG LVL3 (GOWN DISPOSABLE) ×4 IMPLANT
HANDPIECE INTERPULSE COAX TIP (DISPOSABLE)
IMMOBILIZER KNEE 20 (SOFTGOODS)
IMMOBILIZER KNEE 20 THIGH 36 (SOFTGOODS) IMPLANT
IMMOBILIZER KNEE 22 UNIV (SOFTGOODS) ×2 IMPLANT
IMMOBILIZER KNEE 24 THIGH 36 (MISCELLANEOUS) IMPLANT
IMMOBILIZER KNEE 24 UNIV (MISCELLANEOUS)
KIT BASIN OR (CUSTOM PROCEDURE TRAY) ×2 IMPLANT
KIT ROOM TURNOVER OR (KITS) ×2 IMPLANT
MANIFOLD NEPTUNE II (INSTRUMENTS) ×2 IMPLANT
NEEDLE 22X1 1/2 (OR ONLY) (NEEDLE) ×2 IMPLANT
NS IRRIG 1000ML POUR BTL (IV SOLUTION) ×2 IMPLANT
PACK TOTAL JOINT (CUSTOM PROCEDURE TRAY) ×2 IMPLANT
PAD ARMBOARD 7.5X6 YLW CONV (MISCELLANEOUS) ×4 IMPLANT
PRESSURIZER FEMORAL UNIV (MISCELLANEOUS) IMPLANT
SET HNDPC FAN SPRY TIP SCT (DISPOSABLE) IMPLANT
SPONGE LAP 18X18 X RAY DECT (DISPOSABLE) ×2 IMPLANT
STAPLER VISISTAT 35W (STAPLE) ×2 IMPLANT
SUCTION FRAZIER TIP 10 FR DISP (SUCTIONS) ×2 IMPLANT
SUT BONE WAX W31G (SUTURE) ×2 IMPLANT
SUT ETHIBOND NAB CT1 #1 30IN (SUTURE) ×6 IMPLANT
SUT MNCRL AB 3-0 PS2 18 (SUTURE) IMPLANT
SUT VIC AB 0 CT1 27 (SUTURE) ×3
SUT VIC AB 0 CT1 27XBRD ANBCTR (SUTURE) ×3 IMPLANT
SUT VIC AB 2-0 CT1 27 (SUTURE) ×1
SUT VIC AB 2-0 CT1 TAPERPNT 27 (SUTURE) ×1 IMPLANT
SYR CONTROL 10ML LL (SYRINGE) ×2 IMPLANT
TOWEL OR 17X24 6PK STRL BLUE (TOWEL DISPOSABLE) ×2 IMPLANT
TOWEL OR 17X26 10 PK STRL BLUE (TOWEL DISPOSABLE) ×2 IMPLANT
TOWER CARTRIDGE SMART MIX (DISPOSABLE) IMPLANT
TRAY FOLEY CATH 14FR (SET/KITS/TRAYS/PACK) ×2 IMPLANT
WATER STERILE IRR 1000ML POUR (IV SOLUTION) ×8 IMPLANT

## 2011-05-26 NOTE — Transfer of Care (Signed)
Immediate Anesthesia Transfer of Care Note  Patient: Marc Gilbert  Procedure(s) Performed: Procedure(s) (LRB): TOTAL HIP ARTHROPLASTY (Right)  Patient Location: PACU  Anesthesia Type: General  Level of Consciousness: awake, alert , oriented and patient cooperative  Airway & Oxygen Therapy: Patient Spontanous Breathing and Patient connected to face mask oxygen  Post-op Assessment: Report given to PACU RN, Post -op Vital signs reviewed and stable and Patient moving all extremities  Post vital signs: Reviewed and stable  Complications: No apparent anesthesia complications

## 2011-05-26 NOTE — Cardiovascular Report (Signed)
[name], [name] NO.:  [id number]  MEDICAL RECORD NO.:  [id number]  LOCATION:  [facility]                         FACILITY:  [facility]  PHYSICIAN:  Claude Manges. Emmerson Taddei, M.D.DATE OF BIRTH:  [birth year]  DATE OF PROCEDURE: DATE OF DISCHARGE:                           CARDIAC CATHETERIZATION   PREOPERATIVE DIAGNOSIS:  End-stage osteoarthritis, right hip.  POSTOPERATIVE DIAGNOSIS:  End-stage osteoarthritis, right hip.  PROCEDURE:  Right total hip replacement.  SURGEON:  Claude Manges. Cleophas Dunker, M.D.  ASSISTANT:  Arlys John D. Petrarca, P.A.-C.  ANESTHESIA:  General.  COMPLICATIONS:  None.  COMPONENTS:  DePuy AML 13.5 mm large stature femoral component, a 36 mm outer diameter hip ball with a +5 mm neck length, 58 mm outer diameter, 3 holes Gription acetabular component with marathon polyethylene liner +4 with 10-degree posterior lip.  Components were press-fit.  PROCEDURE:  [name] was met in the Holding Area, identified the right hip as the appropriate operative site.  The patient was then transported to room #1 and placed under general anesthesia without difficulty.  He was then placed in the lateral decubitus position with the right side up and secured to the operating room table with the Innomed hip system. Prior to positioning, the catheter was inserted.  The urine was clear.  The right hip was then prepped from the iliac crest to below the knee with Betadine scrub and then DuraPrep.  Sterile draping was performed.  A routine southern incision was utilized via sharp dissection carried down to subcutaneous tissue.  Gross bleeders were Bovie coagulated.  The superficial fascia, adipose tissue was incised to the level of the iliotibial band.  Self-retaining retractors were inserted.  The IT band was then incised.  Retractors were placed more deeply.  Any bleeding was controlled nicely with the Bovie.  With the hip internally rotated, the short external rotators  were identified.  Tendinous structures were tagged with 0 Ethibond sutures and then incised, the capsule was identified and incised on the femoral neck and head, there was part of 7 mL serosanguineous effusion.  The head was easily dislocated posteriorly, and then amputated about a fingerbreadth below the femoral head with an oscillating saw.  The head was then removed from the wound, it was misshapen, there was little if any articular cartilage remaining on the head. Retractors were then placed around the femoral neck.  A center hole was then made in the piriformis fossa.  A canal finder was then inserted, reaming was performed sequentially to 13 mm with 13.5 prosthesis. Rasping was performed sequentially to 13.5.  The osteotomy was about a fingerbreadth proximal to the lesser trochanter.  I had a very nice flush of osteotomy and a calcar reamer was used to obtain the final angle.  Retractors were then placed about the acetabulum.  I sharply excised the labrum.  I also removed circumferential synovitis.  Reaming was then performed sequentially to 58 mm.  I had nice bleeding bone circumferentially and no bony rim.  I trialed a 56 and trial 58 was still tight, but would not completely seat.  I reamed 58 as I was using the Gription acetabular cup.  Three-hole Gription cup was then impacted  what we thought was anatomic position, was nice and tight without any toggling.  The trial polyethylene bearing was then inserted followed by the 13.5 mm femoral component with a +5 neck length, 36 mm diameter hip ball.  The entire construct was then reduced and through a full range of motion, there was absolutely no toggling.  There was no instability despite even with the hip and over 90 degrees of flexion with abduction and internal rotation.  The hip was perfectly stable in extension.  The wound was irrigated with saline solution throughout the procedure. The trial components were removed.  I  again irrigated the acetabulum. The center of the apex hole eliminator was inserted followed by the +4, 10-degree posterior lip marathon polyethylene liner.  The 13.5 mm femoral component was then nicely impacted onto the calcar without problems.  We trialed the +5 neck length to 36 ball and again was perfectly stable.  The trial hip ball was removed.  The final 36 mm outer diameter ball was then impacted after cleaning the [facility] taper neck.  We irrigated the acetabulum, then reduced the head to a full range of motion.  We had perfect stability.  The wound was again irrigated with saline solution.  Capsule was closed anatomically with #1 Ethibond.  Short external rotators were closed with similar material.  The wound was again irrigated.  The iliotibial band closed with running 0 Vicryl subcu in several layers 0, 2-0 Vicryl, and then Monocryl in the subcuticular region.  Skin clips were applied.  Carefully placed the patient in the supine position, he was extubated, placed on the operating room stretcher and returned to the postanesthesia recovery room in satisfactory condition.     Claude Manges. Cleophas Dunker, M.D.     PWW/MEDQ  D:  05/26/2011  T:  05/26/2011  Job:  [id number]

## 2011-05-26 NOTE — Anesthesia Preprocedure Evaluation (Addendum)
Anesthesia Evaluation  Patient identified by MRN, date of birth, ID band Patient awake    Reviewed: Allergy & Precautions, H&P   Airway Mallampati: I  Neck ROM: Full    Dental  (+) Teeth Intact and Dental Advisory Given   Pulmonary COPD breath sounds clear to auscultation        Cardiovascular hypertension, Rhythm:Regular Rate:Normal     Neuro/Psych    GI/Hepatic   Endo/Other    Renal/GU      Musculoskeletal   Abdominal   Peds  Hematology   Anesthesia Other Findings   Reproductive/Obstetrics                          Anesthesia Physical Anesthesia Plan  ASA: II  Anesthesia Plan: General   Post-op Pain Management:    Induction: Intravenous  Airway Management Planned: Oral ETT  Additional Equipment:   Intra-op Plan:   Post-operative Plan: Extubation in OR  Informed Consent:   Dental advisory given  Plan Discussed with: CRNA and Surgeon  Anesthesia Plan Comments:        Anesthesia Quick Evaluation

## 2011-05-26 NOTE — Anesthesia Postprocedure Evaluation (Signed)
  Anesthesia Post-op Note  Patient: Marc Gilbert  Procedure(s) Performed: Procedure(s) (LRB): TOTAL HIP ARTHROPLASTY (Right)  Patient Location: PACU  Anesthesia Type: General  Level of Consciousness: awake  Airway and Oxygen Therapy: Patient Spontanous Breathing  Post-op Pain: mild  Post-op Assessment: Post-op Vital signs reviewed  Post-op Vital Signs: stable  Complications: No apparent anesthesia complications

## 2011-05-26 NOTE — Plan of Care (Signed)
Problem: Consults Goal: Diagnosis- Total Joint Replacement Primary Total Hip     

## 2011-05-26 NOTE — H&P (Signed)
There has been no change in health status since  the current H&P.I have examined the patient and discussed the surgery. No contraindications to the planned procedure exist.    There has been no change in health status since  the current H&P.I have examined the patient and discussed the surgery. No contraindications to the planned procedure exist.

## 2011-05-26 NOTE — Brief Op Note (Signed)
05/26/2011  9:50 AM  PATIENT:  Marc Gilbert  78 y.o. male  PRE-OPERATIVE DIAGNOSIS:  osteoarthritis right hip  POST-OPERATIVE DIAGNOSIS:  osteoarthritis right hip  PROCEDURE:  Procedure(s) (LRB): TOTAL HIP ARTHROPLASTY (Right)  SURGEON:  Surgeon(s) and Role:    * Valeria Batman, MD - Primary  PHYSICIAN ASSISTANT: [name], PA-   ANESTHESIA:   general  EBL:  Total I/O In: 3000 [I.V.:3000] Out: 1775 [Urine:275; Blood:1500]  BLOOD ADMINISTERED:none  DRAINS: none   LOCAL MEDICATIONS USED:  MARCAINE     SPECIMEN:  No Specimen  DISPOSITION OF SPECIMEN:  N/A  COUNTS:  YES  TOURNIQUET:  * No tourniquets in log * TION: .Other Dictation: Dictation Number [id number]  DICTA PLAN OF CARE: Admit to inpatient   PATIENT DISPOSITION:  PACU - hemodynamically stable.   Delay start of Pharmacological VTE agent (>24hrs) due to surgical blood loss or risk of bleeding: not applicable

## 2011-05-26 NOTE — Preoperative (Addendum)
Beta Blockers   Reason not to administer Beta Blockers:Not Applicable 

## 2011-05-27 LAB — BASIC METABOLIC PANEL
BUN: 17 mg/dL (ref 6–23)
CO2: 25 mEq/L (ref 19–32)
Calcium: 7.6 mg/dL — ABNORMAL LOW (ref 8.4–10.5)
Chloride: 107 mEq/L (ref 96–112)
Creatinine, Ser: 1.2 mg/dL (ref 0.50–1.35)
GFR calc Af Amer: 65 mL/min — ABNORMAL LOW (ref >90)
GFR calc non Af Amer: 56 mL/min — ABNORMAL LOW (ref >90)
Glucose, Bld: 102 mg/dL — ABNORMAL HIGH (ref 70–99)
Potassium: 3.9 mEq/L (ref 3.5–5.1)
Sodium: 139 mEq/L (ref 135–145)

## 2011-05-27 LAB — CBC
HCT: 27.5 % — ABNORMAL LOW (ref 39.0–52.0)
Hemoglobin: 9.3 g/dL — ABNORMAL LOW (ref 13.0–17.0)
MCH: 29.9 pg (ref 26.0–34.0)
MCHC: 33.8 g/dL (ref 30.0–36.0)
MCV: 88.4 fL (ref 78.0–100.0)
Platelets: 140 10*3/uL — ABNORMAL LOW (ref 150–400)
RBC: 3.11 MIL/uL — ABNORMAL LOW (ref 4.22–5.81)
RDW: 13.2 % (ref 11.5–15.5)
WBC: 6 10*3/uL (ref 4.0–10.5)

## 2011-05-27 LAB — URINE CULTURE
Colony Count: NO GROWTH
Culture  Setup Time: 201304090812
Culture: NO GROWTH

## 2011-05-27 MED ORDER — POLYETHYLENE GLYCOL 3350 17 G PO PACK: 17.0000 g | PACK | Freq: Every day | ORAL | Status: DC

## 2011-05-27 MED ORDER — ACETAMINOPHEN 10 MG/ML IV SOLN
1000.0000 mg | Freq: Four times a day (QID) | INTRAVENOUS | Status: DC
  Administered 2011-05-27: 1000 mg via INTRAVENOUS
  Filled 2011-05-27 (×4): qty 100

## 2011-05-27 MED ORDER — BISACODYL 10 MG RE SUPP: 10.0000 mg | Freq: Every day | RECTAL | Status: DC | PRN

## 2011-05-27 MED ORDER — POLYETHYLENE GLYCOL 3350 17 G PO PACK
17.0000 g | PACK | Freq: Every day | ORAL | Status: DC
  Administered 2011-05-27 – 2011-05-29 (×3): 17 g via ORAL
  Filled 2011-05-27 (×3): qty 1

## 2011-05-27 MED ORDER — TAMSULOSIN HCL 0.4 MG PO CAPS
0.4000 mg | ORAL_CAPSULE | Freq: Every day | ORAL | Status: DC
  Administered 2011-05-27 – 2011-05-28 (×2): 0.4 mg via ORAL
  Filled 2011-05-27 (×3): qty 1

## 2011-05-27 MED ORDER — FLEET ENEMA 7-19 GM/118ML RE ENEM: 1.0000 | ENEMA | Freq: Every day | RECTAL | Status: DC | PRN

## 2011-05-27 MED ORDER — ACETAMINOPHEN 500 MG PO TABS
1000.0000 mg | ORAL_TABLET | Freq: Four times a day (QID) | ORAL | Status: AC
  Administered 2011-05-28 (×2): 1000 mg via ORAL
  Filled 2011-05-27 (×2): qty 2

## 2011-05-27 NOTE — Progress Notes (Signed)
Patient ID: Marc Gilbert, male   DOB: 08-14-1930, 78 y.o.   MRN: [id number] PATIENT ID: Marc Gilbert        MRN:  [id number]          DOB/AGE: January 31, 1931 / 78 y.o.  [name], MD   [name], PA-C [address] [place], [place], [place]  [address]                             [phone]   PROGRESS NOTE  Subjective:  negative for Chest Pain  negative for Shortness of Breath  negative for Nausea/Vomiting   negative for Calf Pain  negative for Bowel Movement   Tolerating Diet: yes         Patient reports pain as mild.    Objective: Vital signs in last 24 hours:   Patient Vitals for the past 24 hrs:  BP Temp Pulse Resp SpO2  05/27/11 0601 119/60 mmHg 99.5 F (37.5 C) 92  18  99 %  05/26/11 2201 109/56 mmHg 98.1 F (36.7 C) 91  18  96 %  05/26/11 1510 105/62 mmHg 98.2 F (36.8 C) 98  16  98 %  05/26/11 1204 110/64 mmHg 98.3 F (36.8 C) 83  16  100 %  05/26/11 1135 118/58 mmHg 98 F (36.7 C) 91  15  100 %  05/26/11 1127 - - - 13  -  05/26/11 1122 106/51 mmHg - - - -  05/26/11 1121 - - 86  - 100 %  05/26/11 1115 - - 91  16  100 %  05/26/11 1107 113/57 mmHg - - - -  05/26/11 1100 - - 87  17  100 %  05/26/11 1052 132/60 mmHg - - - -  05/26/11 1041 - - 82  - 100 %  05/26/11 1037 131/56 mmHg - - - -  05/26/11 1036 - - - 16  -  05/26/11 1028 - - 76  17  99 %  05/26/11 1022 128/59 mmHg - - - -  05/26/11 1015 - - 83  22  100 %  05/26/11 1007 117/59 mmHg - - - -  05/26/11 1005 - 97.2 F (36.2 C) - - -      Intake/Output from previous day:   04/09 0701 - 04/10 0700 In: 4655 [P.O.:720; I.V.:3685] Out: 4575 [Urine:3075]   Intake/Output this shift:       Intake/Output      04/09 0701 - 04/10 0700 04/10 0701 - 04/11 0700   P.O. 720    I.V. 3685    IV Piggyback 250    Total Intake 4655    Urine 3075    Blood 1500    Total Output 4575    Net +80            LABORATORY DATA:  Basename 05/27/11 0500 05/26/11 1030 05/25/11 0935  WBC 6.0 6.4 4.9  HGB 9.3*  10.9* 15.0  HCT 27.5* 32.3* 44.3  PLT 140* 131* 147*    Basename 05/27/11 0500 05/26/11 1030 05/25/11 0935  NA 139 138 140  K 3.9 5.0 4.7  CL 107 108 104  CO2 25 23 28   BUN 17 22 23   CREATININE 1.20 1.10 1.26  GLUCOSE 102* 165* 99  CALCIUM 7.6* 8.0* 9.7   Lab Results  Component Value Date   INR 1.06 05/25/2011    Examination:  General appearance: alert, cooperative and no distress  Wound  Exam: clean, dry, intact   Drainage:  None: wound tissue dry  Motor Exam: EHL, FHL, Anterior Tibial and Posterior Tibial Intact  Sensory Exam: Superficial Peroneal, Deep Peroneal and Tibial normal  Vascular Exam: Normal  Assessment:    1 Day Post-Op  Procedure(s) (LRB): TOTAL HIP ARTHROPLASTY (Right)  ADDITIONAL DIAGNOSIS:  Principal Problem:  *COPD (chronic obstructive pulmonary disease) Active Problems:  Osteoarthritis of hip  Hypertension  CRF (chronic renal failure)  Cancer of skin, face  Acute Blood Loss Anemia   Plan: Physical Therapy as ordered Weight Bearing as Tolerated (WBAT)  DVT Prophylaxis:  Xarelto  DISCHARGE PLAN: Skilled Nursing Facility/Rehab  DISCHARGE NEEDS: Walker and 3-in-1 comode seat  Good night with minimal pain. Tolerating pain with po analgesics. Foley out.Using incentive respirometry. PT today.[facility] or IP rehab       [name] W 05/27/2011, 7:18 AM

## 2011-05-27 NOTE — Progress Notes (Signed)
Physical Therapy Evaluation Note  Past Medical History  Diagnosis Date  . COPD (chronic obstructive pulmonary disease)     told  had copd  . Chronic kidney disease     insuffiency  no change last 3 yrs  . GERD (gastroesophageal reflux disease)     occ  . H/O hiatal hernia   . Cancer     skin  squamous and basal  . Arthritis    . Past Surgical History  Procedure Date  . Tonsillectomy       05/27/11 0913  PT Visit Information  Last PT Received On 05/27/11  Precautions  Precautions Posterior Hip  Precaution Booklet Issued Yes (comment)  Precaution Comments pt and daughter with verbal understanding  Required Braces or Orthoses Yes  Knee Immobilizer (R LE when in bed)  Restrictions  RLE Weight Bearing WBAT  Home Living  Lives With Spouse  Type of Home House  Home Layout One level  Home Access Stairs to enter  Entrance Stairs-Rails Right (can reach both in front entrance)  Entrance Stairs-Number of Steps 3  Bathroom Shower/Tub Walk-in Pension scheme manager Yes  How Accessible Accessible via walker  Home Adaptive Equipment Built-in shower seat  Prior Function  Level of Independence Independent with basic ADLs;Independent with gait;Independent with transfers  Able to Take Stairs? Yes  Driving Yes  Vocation Retired  Comments Pt plans to go to rehab upon to d/c  Cognition  Arousal/Alertness Awake/alert  Overall Cognitive Status Appears within functional limits for tasks assessed  Orientation Level Oriented X4  Sensation  Light Touch Appears Intact  Bed Mobility  Bed Mobility Yes  Supine to Sit 3: Mod assist;HOB elevated (Comment degrees) (HOB 30 deg)  Supine to Sit Details (indicate cue type and reason) max directional verbal cues, modA at trunk and R LE management  Transfers  Transfers Yes  Sit to Stand 3: Mod assist;With upper extremity assist;From bed  Sit to Stand Details (indicate cue type and reason) max directional  verbal cues for hand placement, mod underarm assist on R to achieve full upright posture  Ambulation/Gait  Ambulation/Gait Yes  Ambulation/Gait Assistance 4: Min assist  Ambulation/Gait Assistance Details (indicate cue type and reason) v/c's for appropriate sequencing with RW  Ambulation Distance (Feet) 10 Feet  Assistive device Rolling walker  Gait Pattern Step-to pattern;Decreased step length - right;Decreased stance time - right;Antalgic  Gait velocity slow  Stairs No  Wheelchair Mobility  Wheelchair Mobility No  Posture/Postural Control  Posture/Postural Control No significant limitations  Balance  Balance Assessed No  RUE Assessment  RUE Assessment WFL  LUE Assessment  LUE Assessment WFL  RLE Assessment  RLE Assessment (pt tolerated approx 75 deg R hip AA flex)  LLE Assessment  LLE Assessment WFL  Cervical Assessment  Cervical Assessment [facility]  Thoracic Assessment  Thoracic Assessment WFL  Lumbar Assessment  Lumbar Assessment WFL  Exercises  Exercises Total Joint (provided hand out for HEP)  Total Joint Exercises  Ankle Circles/Pumps AROM;Both;10 reps;Supine  Quad Sets AROM;Right;10 reps;Supine  Gluteal Sets AROM;Both;10 reps;Supine  Heel Slides Right;AAROM;20 reps;Supine  PT - End of Session  Equipment Utilized During Treatment Gait belt  Activity Tolerance Patient tolerated treatment well  Patient left in chair;with call bell in reach;with family/visitor present  Nurse Communication Mobility status for transfers;Mobility status for ambulation  General  Behavior During Session [facility] for tasks performed  Cognition [facility] for tasks performed  PT Assessment  Clinical Impression Statement Pt s/p  R THA presenting with decreased R LE strength and R A/P ROM. Patient very motivated and doing very well. patient demonstrates tightness in R hip internal rotators. Pt aware and was provided with ther ex to assist in achieving R LE in neutral position.  PT Recommendation/Assessment  Patient will need skilled PT in the acute care venue  PT Problem List Decreased strength;Decreased range of motion;Decreased mobility;Decreased balance;Decreased activity tolerance  PT Therapy Diagnosis  Difficulty walking;Abnormality of gait;Generalized weakness;Acute pain  PT Plan  PT Frequency 7X/week  PT Treatment/Interventions DME instruction;Gait training;Stair training;Functional mobility training;Therapeutic activities;Therapeutic exercise  PT Recommendation  Follow Up Recommendations Skilled nursing facility;Supervision/Assistance - 24 hour. Per pt and daughter pt plans to go to [employer] Defer to next venue  Individuals Consulted  Consulted and Agree with Results and Recommendations Patient;Family member/caregiver  Family Member Consulted daughter  Acute Rehab PT Goals  PT Goal Formulation With patient/family  Time For Goal Achievement 7 days  Pt will go Supine/Side to Sit with modified independence;with HOB 0 degrees (while adhereing to post hip prec on R LE.)  PT Goal: Supine/Side to Sit - Progress Goal set today  Pt will go Sit to Supine/Side with modified independence (up to RW)  PT Goal: Sit to Supine/Side - Progress Goal set today  Pt will go Sit to Stand with modified independence  PT Goal: Sit to Stand - Progress Goal set today  Pt will Ambulate >150 feet;with modified independence;with rolling walker  PT Goal: Ambulate - Progress Goal set today  Pt will Go Up / Down Stairs 3-5 stairs;with min assist;with rail(s)  PT Goal: Up/Down Stairs - Progress Goal set today  Pt will Perform Home Exercise Program Independently  PT Goal: Perform Home Exercise Program - Progress Goal set today  Additional Goals  Additional Goal #1 Pt 100% compliant with R posterior hip precautions.  PT Goal: Additional Goal #1 - Progress Goal set today    Pain: 1/10 R hip at rest and 7-8/10 with R LE ROM/ther ex  Lewis Shock, PT, DPT Pager #: [phone] Office #:  [phone]

## 2011-05-27 NOTE — Progress Notes (Signed)
CARE MANAGEMENT NOTE 05/27/2011   Patient:  Marc Gilbert, Marc Gilbert   Account Number:  [id number]  Date Initiated:  05/27/2011  Documentation initiated by:  Vance Peper  Subjective/Objective Assessment:   78 yr old male s/p right total hip arthroplasty.     Action/Plan:   Patient preoperatively setup with [facility], but plans to go to SNF for shortterm rehab. Social worker aware.   Anticipated DC Date:  05/28/2011   Anticipated DC Plan:  SKILLED NURSING FACILITY      DC Planning Services  CM consult      PAC Choice  NA   Choice offered to / List presented to:     DME arranged  NA      DME agency  NA     HH arranged  NA      HH agency  NA   Status of service:  Completed, signed off  Discharge Disposition:  SKILLED NURSING FACILITY

## 2011-05-27 NOTE — Progress Notes (Signed)
Clinical Social Work Department BRIEF PSYCHOSOCIAL ASSESSMENT 05/27/2011  Patient:  Marc Gilbert, Marc Gilbert     Account Number:  [id number]     Admit date:  05/26/2011  Clinical Social Worker:  Peggyann Shoals  Date/Time:  05/27/2011 11:45 AM  Referred by:  Care Management  Date Referred:  05/27/2011 Referred for  SNF Placement   Other Referral:   Interview type:  Patient Other interview type:    PSYCHOSOCIAL DATA Living Status:  WIFE Admitted from facility:   Level of care:   Primary support name:  Marc Gilbert [phone] Primary support relationship to patient:  SPOUSE Degree of support available:   strong    CURRENT CONCERNS Current Concerns  Post-Acute Placement   Other Concerns:    SOCIAL WORK ASSESSMENT / PLAN Clinical Social Worker met with patient at the bedside to discuss plans and offer support.  Discussed PT recommendation for SNF placement for therapy.  Patient would like to receive therapy at [facility] upon discharge.  CSW left message with facility to find out about a bed offer for the patient.  CSW to facilitate patient discharge needs once patient medically ready for discharge.   Assessment/plan status:  Psychosocial Support/Ongoing Assessment of Needs Other assessment/ plan:   Discharge planning   Information/referral to community resources:   None at this time    PATIENT'S/FAMILY'S RESPONSE TO PLAN OF CARE: Patient alert and oriented x3.  Patient was pleasant and appreciative of Clinical Social Worker assistance.  Patient stated he had contacted [facility] and would like to receive therapy at their facility upon discharge.     Pt was assessed by MSW Intern, Marc Gilbert.   Dede Query, MSW, Theresia Majors [phone]

## 2011-05-27 NOTE — Progress Notes (Addendum)
Clinical Social Work Department CLINICAL SOCIAL WORK PLACEMENT NOTE 05/27/2011  Patient:  Marc Gilbert, Marc Gilbert  Account Number:  [id number] Admit date:  05/26/2011  Clinical Social Worker:  Peggyann Shoals  Date/time:  05/27/2011 11:45 AM  Clinical Social Work is seeking post-discharge placement for this patient at the following level of care:   SKILLED NURSING   (*CSW will update this form in Epic as items are completed)   05/27/2011  Patient/family provided with [facility] Department of Clinical Social Work's list of facilities offering this level of care within the geographic area requested by the patient (or if unable, by the patient's family).  05/27/2011  Patient/family informed of their freedom to choose among providers that offer the needed level of care, that participate in Medicare, Medicaid or managed care program needed by the patient, have an available bed and are willing to accept the patient.  05/27/2011  Patient/family informed of [facility]' ownership interest in [facility], as well as of the fact that they are under no obligation to receive care at this facility.  PASARR submitted to EDS on 05/27/2011 PASARR number received from EDS on 05/27/2011  FL2 transmitted to all facilities in geographic area requested by pt/family on  05/27/2011 FL2 transmitted to all facilities within larger geographic area on   Patient informed that his/her managed care company has contracts with or will negotiate with  certain facilities, including the following:     Patient/family informed of bed offers received:  05/27/2011- pt had arrangements with [facility] Patient chooses bed at [facility] Physician recommends and patient chooses bed at    Patient to be transferred to  on  [facility] 05/29/2011 Patient to be transferred to facility by ambulance  The following physician request were entered in Epic:   Additional Comments: Pt has made arrangements with [facility].  Dede Query, MSW, Theresia Majors [phone]  05/29/2011 Jacklynn Lewis, MSW, LCSWA  Clinical Social Work [phone]

## 2011-05-28 ENCOUNTER — Encounter (HOSPITAL_COMMUNITY): Payer: Self-pay | Admitting: Orthopaedic Surgery

## 2011-05-28 DIAGNOSIS — D62 Acute posthemorrhagic anemia: Secondary | ICD-10-CM | POA: Diagnosis not present

## 2011-05-28 DIAGNOSIS — K5903 Drug induced constipation: Secondary | ICD-10-CM | POA: Diagnosis not present

## 2011-05-28 LAB — BASIC METABOLIC PANEL
BUN: 16 mg/dL (ref 6–23)
CO2: 25 mEq/L (ref 19–32)
Calcium: 7.9 mg/dL — ABNORMAL LOW (ref 8.4–10.5)
Chloride: 103 mEq/L (ref 96–112)
Creatinine, Ser: 1.12 mg/dL (ref 0.50–1.35)
GFR calc Af Amer: 71 mL/min — ABNORMAL LOW (ref >90)
GFR calc non Af Amer: 61 mL/min — ABNORMAL LOW (ref >90)
Glucose, Bld: 118 mg/dL — ABNORMAL HIGH (ref 70–99)
Potassium: 3.8 mEq/L (ref 3.5–5.1)
Sodium: 136 mEq/L (ref 135–145)

## 2011-05-28 LAB — CBC
HCT: 26.5 % — ABNORMAL LOW (ref 39.0–52.0)
Hemoglobin: 8.9 g/dL — ABNORMAL LOW (ref 13.0–17.0)
MCH: 29.5 pg (ref 26.0–34.0)
MCHC: 33.6 g/dL (ref 30.0–36.0)
MCV: 87.7 fL (ref 78.0–100.0)
Platelets: 155 10*3/uL (ref 150–400)
RBC: 3.02 MIL/uL — ABNORMAL LOW (ref 4.22–5.81)
RDW: 13.3 % (ref 11.5–15.5)
WBC: 8 10*3/uL (ref 4.0–10.5)

## 2011-05-28 MED ORDER — METHOCARBAMOL 500 MG PO TABS: 500.0000 mg | ORAL_TABLET | Freq: Three times a day (TID) | ORAL | Status: AC | PRN

## 2011-05-28 MED ORDER — RIVAROXABAN 10 MG PO TABS: 10.0000 mg | ORAL_TABLET | ORAL | Status: DC

## 2011-05-28 MED ORDER — OXYCODONE HCL 5 MG PO TABS: 5.0000 mg | ORAL_TABLET | ORAL | Status: DC | PRN

## 2011-05-28 MED ORDER — METHOCARBAMOL 500 MG PO TABS: 500.0000 mg | ORAL_TABLET | Freq: Three times a day (TID) | ORAL | Status: DC | PRN

## 2011-05-28 MED ORDER — OXYCODONE HCL 5 MG PO TABS: 5.0000 mg | ORAL_TABLET | ORAL | Status: AC | PRN

## 2011-05-28 MED ORDER — POLYETHYLENE GLYCOL 3350 17 G PO PACK
17.0000 g | PACK | Freq: Once | ORAL | Status: AC
  Administered 2011-05-28: 17 g via ORAL

## 2011-05-28 MED ORDER — ACETAMINOPHEN 500 MG PO TABS
1000.0000 mg | ORAL_TABLET | Freq: Four times a day (QID) | ORAL | Status: DC | PRN
  Administered 2011-05-28 – 2011-05-29 (×2): 1000 mg via ORAL
  Filled 2011-05-28 (×3): qty 2

## 2011-05-28 MED ORDER — PSYLLIUM 95 % PO PACK
1.0000 | PACK | Freq: Every day | ORAL | Status: DC
  Administered 2011-05-28 – 2011-05-29 (×2): 1 via ORAL
  Filled 2011-05-28 (×2): qty 1

## 2011-05-28 NOTE — Discharge Summary (Signed)
PATIENT ID:      Marc Gilbert  MRN:     [id number] DOB/AGE:    Jul 05, 1930 / 78 y.o.     DISCHARGE SUMMARY  ADMISSION DATE:    05/26/2011 DISCHARGE DATE:   05/29/2011   ADMISSION DIAGNOSIS: osteoarthritis right hip    DISCHARGE DIAGNOSIS:  osteoarthritis right hip    ADDITIONAL DIAGNOSIS: Principal Problem:  *Osteoarthritis of hip Active Problems:  Hypertension  CRF (chronic renal failure)  Cancer of skin, face  Constipation due to pain medication  Postoperative anemia due to acute blood loss  Past Medical History  Diagnosis Date  . COPD (chronic obstructive pulmonary disease)     told  had copd  . Chronic kidney disease     insuffiency  no change last 3 yrs  . GERD (gastroesophageal reflux disease)     occ  . H/O hiatal hernia   . Cancer     skin  squamous and basal  . Arthritis     PROCEDURE: Procedure(s): RIGHT TOTAL HIP ARTHROPLASTY on 05/26/2011  CONSULTS:   NONE  HISTORY: Marc Gilbert is a very pleasant 78 year old white male who is seen today for evaluation of his right hip. He has been noted to have a deformed femoral head and considerable degenerative arthritis of the right hip in the past. It has been progressive to the point now where he needs to use a cane for ambulation. He is now starting to develop even some back pain secondary to his limp. He is able to sit and is somewhat comfortable. He does have difficulty with ambulation as well as sleeping and certainly difficulty with riding in a car. He states his pain is constant, severe, and sharp as well as with a dull ache. He has noticed weakness secondary to the pain. He does have occasional problems with sleep. Rest has been helpful. He has been using tramadol 50 mg t.i.d. for his pain.    HOSPITAL COURSE:  RAESHAUN SIMSON is a 78 y.o. admitted on 05/26/2011 and found to have a diagnosis of osteoarthritis right hip.  After appropriate laboratory studies were obtained  they were taken to the operating room on 05/26/2011 and  underwent Procedure(s): RIGHT TOTAL HIP ARTHROPLASTY.   They were given perioperative antibiotics:  Anti-infectives     Start     Dose/Rate Route Frequency Ordered Stop   05/26/11 1315   ceFAZolin (ANCEF) IVPB 1 g/50 mL premix        1 g 100 mL/hr over 30 Minutes Intravenous Every 6 hours 05/26/11 1209 05/27/11 0123        .  Tolerated the procedure well.  Placed with a foley intraoperatively.  Given Ofirmev at induction and for 48 hours.    POD #1, allowed out of bed to a chair.  PT for ambulation and exercise program.  Foley D/C'd in morning.  IV saline locked.  O2 discontionued.  POD #2, continued PT and ambulation walking about 250 feet.  Pain well controlled.  No BM since Tuesday 6am. Mild temp elevation during the night became afebrile.  POD #3, had BM last night.  Wants to go to SNF.  The remainder of the hospital course was dedicated to ambulation and strengthening.   The patient was discharged on 3 Days Post-Op in  Stable condition.  Blood products given:none  DIAGNOSTIC STUDIES: Recent vital signs:  Patient Vitals for the past 24 hrs:  BP Temp Pulse Resp SpO2  05/29/11 0620 117/62 mmHg 99.9 F (37.7 C)  92  18  95 %  08-Jun-2011 2158 127/59 mmHg 99.3 F (37.4 C) 92  18  96 %  06-08-2011 1414 123/56 mmHg 98.8 F (37.1 C) 97  16  98 %       Recent laboratory studies:  Basename 05/29/11 0605 June 08, 2011 0510 05/27/11 0500 05/26/11 1030 05/25/11 0935  WBC 8.3 8.0 6.0 6.4 4.9  HGB 9.1* 8.9* 9.3* 10.9* 15.0  HCT 26.9* 26.5* 27.5* 32.3* 44.3  PLT 190 155 140* 131* 147*    Basename 05/29/11 0605 06/08/11 0510 05/27/11 0500 05/26/11 1030 05/25/11 0935  NA 136 136 139 138 140  K 4.2 3.8 3.9 5.0 4.7  CL 102 103 107 108 104  CO2 27 25 25 23 28   BUN 16 16 17 22 23   CREATININE 1.20 1.12 1.20 1.10 1.26  GLUCOSE 112* 118* 102* 165* 99  CALCIUM 8.3* 7.9* 7.6* 8.0* 9.7   Lab Results  Component Value Date   INR 1.06 05/25/2011     Recent Radiographic Studies :  Dg Pelvis  Portable  05/26/2011  *RADIOLOGY REPORT*  Clinical Data: Post right hip arthroplasty.  PORTABLE PELVIS  Comparison: Previous examination of same date.  Findings: Right hip arthroplasty has been performed.  No dislocation or disruption of hardware is seen.  There is expected relationship between acetabular and femoral components.  Skin staples are seen.  There is some air seen at the operative site.  IMPRESSION: Post right hip arthroplasty.  Original Report Authenticated By: Crawford Givens, M.D.   Dg Hip Portable 1 View Right  05/26/2011  *RADIOLOGY REPORT*  Clinical Data: Postoperative.  Post right hip arthroplasty.  PORTABLE RIGHT HIP - 1 VIEW  Comparison: None.  Findings: Right hip arthroplasty has been performed.  There is expected relationship between acetabular and femoral components. No dislocation is seen.  No disruption of hardware is seen.  There is edema and some soft tissue air at the operative sites.  Skin staples are seen.  IMPRESSION: Postoperative appearance.  Post right hip arthroplasty.  Original Report Authenticated By: Crawford Givens, M.D.    DISCHARGE INSTRUCTIONS: Discharge Orders    Future Orders Please Complete By Expires   Diet general      Call MD / Call 911      Comments:   If you experience chest pain or shortness of breath, CALL 911 and be transported to the hospital emergency room.  If you develope a fever above 101 F, pus (white drainage) or increased drainage or redness at the wound, or calf pain, call your surgeon's office.   Constipation Prevention      Comments:   Drink plenty of fluids.  Prune juice may be helpful.  You may use a stool softener, such as Colace (over the counter) 100 mg twice a day.  Use MiraLax (over the counter) for constipation as needed.  Metamucil as per patient   Increase activity slowly as tolerated      Weight Bearing as taught in Physical Therapy      Comments:   Use a walker or crutches as instructed in Physical Therapy (PT) weight bearing as  tolerated.   Patient may shower      Comments:   You may shower without a dressing once there is no drainage.  Do not wash over the wound.  If drainage remains, cover wound with plastic wrap and then shower.   Driving restrictions      Comments:   No driving for 6 weeks   Lifting  restrictions      Comments:   No lifting for 6 weeks   Follow the hip precautions as taught in Physical Therapy      Change dressing      Comments:   You may change your dressing on Saturday, then change the dressing daily with sterile 4 x 4 inch gauze dressing and paper tape.  You may clean the incision with alcohol prior to redressing   TED hose      Comments:   Use stockings (TED hose) for 3 weeks on right leg(s).  You may remove them at night for sleeping.      DISCHARGE MEDICATIONS:   Medication List  As of 05/29/2011  8:18 AM   STOP taking these medications         aspirin EC 81 MG tablet      fish oil-omega-3 fatty acids 1000 MG capsule      mulitivitamin with minerals Tabs      naproxen sodium 220 MG tablet      traMADol 50 MG tablet         TAKE these medications         losartan-hydrochlorothiazide 100-25 MG per tablet   Commonly known as: HYZAAR   Take 0.5 tablets by mouth daily.      methocarbamol 500 MG tablet   Commonly known as: ROBAXIN   Take 1 tablet (500 mg total) by mouth every 8 (eight) hours as needed (spasms).      oxyCODONE 5 MG immediate release tablet   Commonly known as: Oxy IR/ROXICODONE   Take 1-2 tablets (5-10 mg total) by mouth every 4 (four) hours as needed.      pravastatin 40 MG tablet   Commonly known as: PRAVACHOL   Take 40 mg by mouth daily.      rivaroxaban 10 MG Tabs tablet   Commonly known as: XARELTO   Take 1 tablet (10 mg total) by mouth daily.      Tamsulosin HCl 0.4 MG Caps   Commonly known as: FLOMAX   Take 0.4 mg by mouth daily.            FOLLOW UP VISIT:   Follow-up Information    Follow up with [facility]. Schedule an  appointment as soon as possible for a visit on 06/10/2011.   Contact information:   [address] [place] [address] [phone]          DISPOSITION:  Skilled nursing facility    CONDITION:  Stable   Shaylin Blatt 05/29/2011, 8:18 AM

## 2011-05-28 NOTE — Progress Notes (Signed)
Physical Therapy Treatment Note   05/28/11 0958  PT Visit Information  Last PT Received On 05/28/11  Precautions  Precautions Posterior Hip  Precaution Comments pt able to recall precautions with use of sheet  Restrictions  RLE Weight Bearing WBAT  Bed Mobility  Bed Mobility (pt received sitting up in chair)  Transfers  Sit to Stand 4: Min assist (contact guard)  Sit to Stand Details (indicate cue type and reason) increased time  Ambulation/Gait  Ambulation/Gait Assistance 4: Min assist (contact guard)  Ambulation Distance (Feet) 150 Feet  Assistive device Rolling walker  Gait Pattern Step-to pattern;Decreased step length - left;Decreased stance time - left  Gait velocity improved from yesterday  Stairs No  Wheelchair Mobility  Wheelchair Mobility No  Total Joint Exercises  Ankle Circles/Pumps AROM;Both;10 reps;Seated  Gluteal Sets AROM;Both;10 reps;Seated  Long Arc Quad AROM;Right;10 reps;Seated (with 3 sec hold)  Marching in Standing AROM;Right;10 reps;Standing  Standing Hip Extension AROM;Right;10 reps;Standing  Hip ABduction/ADduction AROM;Right;10 reps;Standing  PT - End of Session  Equipment Utilized During Treatment Gait belt  Activity Tolerance Patient tolerated treatment well  Patient left in chair;with call bell in reach  Nurse Communication Mobility status for transfers;Mobility status for ambulation  General  Behavior During Session [facility] for tasks performed  Cognition [facility] for tasks performed  PT - Assessment/Plan  Comments on Treatment Session Patient progressing very well. Pt I PTA and is unable to return home with spouse due to inaccessible home and inability for spouse to provide assistance. Pt to con't to benefit from skilled PT upon d/c to [facility] for safe transition home.  PT Plan Discharge plan remains appropriate;Frequency remains appropriate  PT Frequency 7X/week  Follow Up Recommendations Skilled nursing  facility;Supervision/Assistance - 24 hour  Equipment Recommended Defer to next venue  Acute Rehab PT Goals  PT Goal: Sit to Stand - Progress Progressing toward goal  PT Goal: Ambulate - Progress Progressing toward goal  PT Goal: Perform Home Exercise Program - Progress Progressing toward goal  Additional Goals  PT Goal: Additional Goal #1 - Progress Progressing toward goal    Pain: pt did not rate R hip pain but reports pain to be "not to bad."  Lewis Shock, PT, DPT Pager #: [phone] Office #: [phone]

## 2011-05-28 NOTE — Progress Notes (Signed)
OT Note OT consult received and appreciated. Noted pt. With plans to D/C to [facility] at D/C and will defer further therapy to SNF. Signing off acutely. Thanks!  Cassandria Anger, OTR/L Pager: [phone] 05/28/2011 .

## 2011-05-28 NOTE — Progress Notes (Signed)
CSW met with pt and pt's daughter to address discharge plan. CSW informed pt that he does not qualify for CIR, therefore will need SNF. Pt is understanding and would like to pursue [facility] for rehab. CSW contacted [facility] and they will be able to accept pt tomorrow, 05/29/11. CSW will continue to follow to facilitate discharge to [facility].   Dede Query, MSW, Theresia Majors [phone]

## 2011-05-28 NOTE — Progress Notes (Signed)
Patient ID: Marc Gilbert, male   DOB: 04/16/1930, 78 y.o.   MRN: [id number] PATIENT ID: Marc Gilbert        MRN:  [id number]          DOB/AGE: Sep 19, 1930 / 78 y.o.  Marc Campbell, MD   Marc Code, PA-C [address] [place], [place], [place]  [address]                             [phone]   PROGRESS NOTE  Subjective:  negative for Chest Pain  negative for Shortness of Breath  negative for Nausea/Vomiting   negative for Calf Pain  negative for Bowel Movement   Tolerating Diet: yes         Patient reports pain as mild.    Doing well.  No major complaints except has not had a bowel movement.  Ambulating without difficulty.  Last pain med at 5:30am.  Objective: Vital signs in last 24 hours:   Patient Vitals for the past 24 hrs:  BP Temp Pulse Resp SpO2  05/28/11 1414 123/56 mmHg 98.8 F (37.1 C) 97  16  98 %  05/28/11 0534 141/63 mmHg 99.1 F (37.3 C) 95  18  95 %  05/28/11 0345 - 101.6 F (38.7 C) - - -  05/27/11 2334 - 100.4 F (38 C) - - -  05/27/11 2133 135/58 mmHg 101.6 F (38.7 C) 106  18  98 %  05/27/11 1432 109/66 mmHg 98.9 F (37.2 C) 91  18  97 %      Intake/Output from previous day:   04/10 0701 - 04/11 0700 In: 840 [P.O.:840] Out: 1600 [Urine:1600]   Intake/Output this shift:   04/11 0701 - 04/11 1900 In: 240 [P.O.:240] Out: 500 [Urine:500]   Intake/Output      04/10 0701 - 04/11 0700 04/11 0701 - 04/12 0700   P.O. 840 240   I.V.     IV Piggyback     Total Intake 840 240   Urine 1600 500   Blood     Total Output 1600 500   Net -760 -260        Urine Occurrence  1 x      LABORATORY DATA:  Basename 05/28/11 0510 05/27/11 0500 05/26/11 1030 05/25/11 0935  WBC 8.0 6.0 6.4 4.9  HGB 8.9* 9.3* 10.9* 15.0  HCT 26.5* 27.5* 32.3* 44.3  PLT 155 140* 131* 147*    Basename 05/28/11 0510 05/27/11 0500 05/26/11 1030 05/25/11 0935  NA 136 139 138 140  K 3.8 3.9 5.0 4.7  CL 103 107 108 104  CO2 25 25 23 28   BUN 16 17 22 23   CREATININE 1.12  1.20 1.10 1.26  GLUCOSE 118* 102* 165* 99  CALCIUM 7.9* 7.6* 8.0* 9.7   Lab Results  Component Value Date   INR 1.06 05/25/2011    Examination:  General appearance: alert, cooperative and mild distress Resp: clear to auscultation bilaterally Cardio: regular rate and rhythm GI: normal findings: bowel sounds normal  Wound Exam: clean, dry, intact   Drainage:  None: wound tissue dry  Motor Exam: EHL, FHL, Anterior Tibial and Posterior Tibial Intact  Sensory Exam: Superficial Peroneal, Deep Peroneal and Tibial normal  Vascular Exam: Right posterior tibial artery has 1+ (weak) pulse and Right dorsalis pedis artery has 1+ (weak) pulse  Assessment:    2 Days Post-Op  Procedure(s) (LRB): TOTAL HIP ARTHROPLASTY (Right)  ADDITIONAL DIAGNOSIS:  Principal Problem:  *Osteoarthritis of hip Active Problems:  Hypertension  CRF (chronic renal failure)  Cancer of skin, face  Constipation due to pain medication  Postoperative anemia due to acute blood loss  Acute Blood Loss Anemia   Plan: Physical Therapy as ordered Weight Bearing as Tolerated (WBAT)  DVT Prophylaxis:  Xarelto, Foot Pumps and TED hose  DISCHARGE PLAN: Skilled Nursing Facility/Rehab tomorrow ([facility])  DISCHARGE NEEDS: HHPT, Walker and 3-in-1 comode seat       Marc Gilbert 05/28/2011, 2:22 PM

## 2011-05-29 DIAGNOSIS — D62 Acute posthemorrhagic anemia: Secondary | ICD-10-CM | POA: Diagnosis not present

## 2011-05-29 DIAGNOSIS — M1990 Unspecified osteoarthritis, unspecified site: Secondary | ICD-10-CM | POA: Diagnosis not present

## 2011-05-29 DIAGNOSIS — Z96649 Presence of unspecified artificial hip joint: Secondary | ICD-10-CM | POA: Diagnosis not present

## 2011-05-29 DIAGNOSIS — E785 Hyperlipidemia, unspecified: Secondary | ICD-10-CM | POA: Diagnosis not present

## 2011-05-29 DIAGNOSIS — M129 Arthropathy, unspecified: Secondary | ICD-10-CM | POA: Diagnosis not present

## 2011-05-29 DIAGNOSIS — I150 Renovascular hypertension: Secondary | ICD-10-CM | POA: Diagnosis not present

## 2011-05-29 DIAGNOSIS — C44300 Unspecified malignant neoplasm of skin of unspecified part of face: Secondary | ICD-10-CM | POA: Diagnosis not present

## 2011-05-29 DIAGNOSIS — Z5189 Encounter for other specified aftercare: Secondary | ICD-10-CM | POA: Diagnosis not present

## 2011-05-29 DIAGNOSIS — J449 Chronic obstructive pulmonary disease, unspecified: Secondary | ICD-10-CM | POA: Diagnosis not present

## 2011-05-29 DIAGNOSIS — I10 Essential (primary) hypertension: Secondary | ICD-10-CM | POA: Diagnosis not present

## 2011-05-29 DIAGNOSIS — M25559 Pain in unspecified hip: Secondary | ICD-10-CM | POA: Diagnosis not present

## 2011-05-29 DIAGNOSIS — M1610 Unilateral primary osteoarthritis, unspecified hip: Secondary | ICD-10-CM | POA: Diagnosis not present

## 2011-05-29 DIAGNOSIS — N189 Chronic kidney disease, unspecified: Secondary | ICD-10-CM | POA: Diagnosis not present

## 2011-05-29 LAB — CBC
HCT: 26.9 % — ABNORMAL LOW (ref 39.0–52.0)
Hemoglobin: 9.1 g/dL — ABNORMAL LOW (ref 13.0–17.0)
MCH: 29.8 pg (ref 26.0–34.0)
MCHC: 33.8 g/dL (ref 30.0–36.0)
MCV: 88.2 fL (ref 78.0–100.0)
Platelets: 190 10*3/uL (ref 150–400)
RBC: 3.05 MIL/uL — ABNORMAL LOW (ref 4.22–5.81)
RDW: 13.3 % (ref 11.5–15.5)
WBC: 8.3 10*3/uL (ref 4.0–10.5)

## 2011-05-29 LAB — BASIC METABOLIC PANEL
BUN: 16 mg/dL (ref 6–23)
CO2: 27 mEq/L (ref 19–32)
Calcium: 8.3 mg/dL — ABNORMAL LOW (ref 8.4–10.5)
Chloride: 102 mEq/L (ref 96–112)
Creatinine, Ser: 1.2 mg/dL (ref 0.50–1.35)
GFR calc Af Amer: 65 mL/min — ABNORMAL LOW (ref >90)
GFR calc non Af Amer: 56 mL/min — ABNORMAL LOW (ref >90)
Glucose, Bld: 112 mg/dL — ABNORMAL HIGH (ref 70–99)
Potassium: 4.2 mEq/L (ref 3.5–5.1)
Sodium: 136 mEq/L (ref 135–145)

## 2011-05-29 NOTE — Progress Notes (Signed)
Physical Therapy Treatment Note   05/29/11 1128  PT Visit Information  Last PT Received On 05/29/11  Precautions  Precautions Posterior Hip  Precaution Comments able to recall 2/3 of precautions  Restrictions  RLE Weight Bearing WBAT  Bed Mobility  Bed Mobility (pt received sitting up in chair)  Transfers  Sit to Stand 5: Supervision  Ambulation/Gait  Ambulation/Gait Assistance 5: Supervision  Ambulation/Gait Assistance Details (indicate cue type and reason) pt able to tolerate reciprocal gait pattern this date with mild antalgia  Ambulation Distance (Feet) 200 Feet  Assistive device Rolling walker  Gait Pattern Step-through pattern (mild antalgia)  Gait velocity improved from yesterday, more fluid gait pattern  Stairs No  Total Joint Exercises  Heel Slides AROM;Right;10 reps;Seated (with resistance)  Long Arc Quad AROM;Left;10 reps;Seated  Marching in Standing AROM;Left;Standing (20)  Standing Hip Extension AROM;Right;Seated (20)  Hip ABduction/ADduction AROM;Right;20 reps;Standing  PT - End of Session  Activity Tolerance Patient tolerated treatment well  Patient left in bed;with call bell in reach;with family/visitor present  Nurse Communication Mobility status for transfers;Mobility status for ambulation (Rn made aware of pt desire for RN to check surgical incision)  General  Behavior During Session [facility] for tasks performed  Cognition [facility] for tasks performed  PT - Assessment/Plan  Comments on Treatment Session Patient con't to progress towards all goals. Patient with improved R LE strength and active R hip ROM.  PT Plan Frequency remains appropriate;Discharge plan remains appropriate  PT Frequency 7X/week  Follow Up Recommendations Skilled nursing facility;Supervision/Assistance - 24 hour  Equipment Recommended Defer to next venue  Acute Rehab PT Goals  PT Goal: Sit to Stand - Progress Progressing toward goal  PT Goal: Ambulate - Progress Progressing toward goal  PT  Goal: Perform Home Exercise Program - Progress Progressing toward goal  Additional Goals  Additional Goal #1 Pt 100% compliant with R post hip prec.  PT Goal: Additional Goal #1 - Progress Progressing toward goal    Pain: pt did not rate  Lewis Shock, PT, DPT Pager #: [phone] Office #: [phone]

## 2011-05-29 NOTE — Progress Notes (Signed)
Clinical Social Worker facilitated pt discharge needs including contacting facility, notifying family at bedside, and arranging ambulance transportation for pt to [facility]. No further social work needs identified at this time. Clinical Social Worker signing off.   Jacklynn Lewis, MSW, LCSWA  Clinical Social Work [phone]

## 2011-05-29 NOTE — Progress Notes (Signed)
Patient ID: Marc Gilbert, male   DOB: 03/26/1930, 78 y.o.   MRN: [id number] PATIENT ID: Marc Gilbert        MRN:  [id number]          DOB/AGE: 22-May-1930 / 78 y.o.  Marc Campbell, MD   [name], PA-C [address] [place], [place], [place]  [address]                             [phone]   PROGRESS NOTE  Subjective:  negative for Chest Pain  negative for Shortness of Breath  negative for Nausea/Vomiting   negative for Calf Pain  positive for Bowel Movement   Tolerating Diet: yes         Patient reports pain as mild.  Well Controlled.  Up in chair this AM bathing.   Objective: Vital signs in last 24 hours:   Patient Vitals for the past 24 hrs:  BP Temp Pulse Resp SpO2  05/29/11 0620 117/62 mmHg 99.9 F (37.7 C) 92  18  95 %  05/28/11 2158 127/59 mmHg 99.3 F (37.4 C) 92  18  96 %  05/28/11 1414 123/56 mmHg 98.8 F (37.1 C) 97  16  98 %      Intake/Output from previous day:   04/11 0701 - 04/12 0700 In: 240 [P.O.:240] Out: 1150 [Urine:1150]   Intake/Output this shift:       Intake/Output      04/11 0701 - 04/12 0700 04/12 0701 - 04/13 0700   P.O. 240    Total Intake 240    Urine 1150    Total Output 1150    Net -910         Urine Occurrence 1 x       LABORATORY DATA:  Basename 05/29/11 0605 05/28/11 0510 05/27/11 0500 05/26/11 1030 05/25/11 0935  WBC 8.3 8.0 6.0 6.4 4.9  HGB 9.1* 8.9* 9.3* 10.9* 15.0  HCT 26.9* 26.5* 27.5* 32.3* 44.3  PLT 190 155 140* 131* 147*    Basename 05/29/11 0605 05/28/11 0510 05/27/11 0500 05/26/11 1030 05/25/11 0935  NA 136 136 139 138 140  K 4.2 3.8 3.9 5.0 4.7  CL 102 103 107 108 104  CO2 27 25 25 23 28   BUN 16 16 17 22 23   CREATININE 1.20 1.12 1.20 1.10 1.26  GLUCOSE 112* 118* 102* 165* 99  CALCIUM 8.3* 7.9* 7.6* 8.0* 9.7   Lab Results  Component Value Date   INR 1.06 05/25/2011    Examination:  General appearance: alert, cooperative and mild distress Resp: clear to auscultation bilaterally Cardio:  regular rate and rhythm GI: normal findings: bowel sounds normal  Wound Exam: clean, dry, intact   Drainage:  None: wound tissue dry  Motor Exam: EHL, FHL, Anterior Tibial and Posterior Tibial Intact  Sensory Exam: Superficial Peroneal, Deep Peroneal and Tibial normal  Vascular Exam: Right dorsalis pedis artery has trace pulse  Assessment:    3 Days Post-Op  Procedure(s) (LRB): TOTAL HIP ARTHROPLASTY (Right)  ADDITIONAL DIAGNOSIS:  Principal Problem:  *Osteoarthritis of hip Active Problems:  Hypertension  CRF (chronic renal failure)  Cancer of skin, face  Constipation due to pain medication  Postoperative anemia due to acute blood loss  Acute Blood Loss Anemia   Plan: Physical Therapy as ordered Weight Bearing as Tolerated (WBAT)  DVT Prophylaxis:  Xarelto, Foot Pumps and TED hose  DISCHARGE PLAN: Skilled Nursing Facility/Rehab today  DISCHARGE NEEDS: HHPT, Walker and 3-in-1 comode seat  Plan for d/c to [facility] today. States pain is well controlled. F/U office in 2 weeks    Encourage incentive spirometry  [facility] 05/29/2011, 8:09 AM

## 2011-06-03 DIAGNOSIS — J449 Chronic obstructive pulmonary disease, unspecified: Secondary | ICD-10-CM | POA: Diagnosis not present

## 2011-06-03 DIAGNOSIS — M1610 Unilateral primary osteoarthritis, unspecified hip: Secondary | ICD-10-CM | POA: Diagnosis not present

## 2011-06-03 DIAGNOSIS — D62 Acute posthemorrhagic anemia: Secondary | ICD-10-CM | POA: Diagnosis not present

## 2011-06-03 DIAGNOSIS — I150 Renovascular hypertension: Secondary | ICD-10-CM | POA: Diagnosis not present

## 2011-06-10 DIAGNOSIS — M4120 Other idiopathic scoliosis, site unspecified: Secondary | ICD-10-CM | POA: Diagnosis not present

## 2011-06-10 DIAGNOSIS — L259 Unspecified contact dermatitis, unspecified cause: Secondary | ICD-10-CM | POA: Diagnosis not present

## 2011-06-10 DIAGNOSIS — D044 Carcinoma in situ of skin of scalp and neck: Secondary | ICD-10-CM | POA: Diagnosis not present

## 2011-06-10 DIAGNOSIS — D0470 Carcinoma in situ of skin of unspecified lower limb, including hip: Secondary | ICD-10-CM | POA: Diagnosis not present

## 2011-06-10 DIAGNOSIS — L570 Actinic keratosis: Secondary | ICD-10-CM | POA: Diagnosis not present

## 2011-06-10 DIAGNOSIS — M25559 Pain in unspecified hip: Secondary | ICD-10-CM | POA: Diagnosis not present

## 2011-06-15 DIAGNOSIS — I10 Essential (primary) hypertension: Secondary | ICD-10-CM | POA: Diagnosis not present

## 2011-06-15 DIAGNOSIS — E7800 Pure hypercholesterolemia, unspecified: Secondary | ICD-10-CM | POA: Diagnosis not present

## 2011-06-15 DIAGNOSIS — N182 Chronic kidney disease, stage 2 (mild): Secondary | ICD-10-CM | POA: Diagnosis not present

## 2011-06-15 DIAGNOSIS — N183 Chronic kidney disease, stage 3 unspecified: Secondary | ICD-10-CM | POA: Diagnosis not present

## 2011-06-16 DIAGNOSIS — M1610 Unilateral primary osteoarthritis, unspecified hip: Secondary | ICD-10-CM | POA: Diagnosis not present

## 2011-06-16 DIAGNOSIS — M6281 Muscle weakness (generalized): Secondary | ICD-10-CM | POA: Diagnosis not present

## 2011-06-16 DIAGNOSIS — M25559 Pain in unspecified hip: Secondary | ICD-10-CM | POA: Diagnosis not present

## 2011-06-18 DIAGNOSIS — M6281 Muscle weakness (generalized): Secondary | ICD-10-CM | POA: Diagnosis not present

## 2011-06-18 DIAGNOSIS — M1610 Unilateral primary osteoarthritis, unspecified hip: Secondary | ICD-10-CM | POA: Diagnosis not present

## 2011-06-18 DIAGNOSIS — M25559 Pain in unspecified hip: Secondary | ICD-10-CM | POA: Diagnosis not present

## 2011-06-23 DIAGNOSIS — M6281 Muscle weakness (generalized): Secondary | ICD-10-CM | POA: Diagnosis not present

## 2011-06-23 DIAGNOSIS — R269 Unspecified abnormalities of gait and mobility: Secondary | ICD-10-CM | POA: Diagnosis not present

## 2011-06-23 DIAGNOSIS — M1610 Unilateral primary osteoarthritis, unspecified hip: Secondary | ICD-10-CM | POA: Diagnosis not present

## 2011-06-23 DIAGNOSIS — M25559 Pain in unspecified hip: Secondary | ICD-10-CM | POA: Diagnosis not present

## 2011-06-24 DIAGNOSIS — E875 Hyperkalemia: Secondary | ICD-10-CM | POA: Diagnosis not present

## 2011-06-24 DIAGNOSIS — Z96649 Presence of unspecified artificial hip joint: Secondary | ICD-10-CM | POA: Diagnosis not present

## 2011-06-24 DIAGNOSIS — I120 Hypertensive chronic kidney disease with stage 5 chronic kidney disease or end stage renal disease: Secondary | ICD-10-CM | POA: Diagnosis not present

## 2011-06-24 DIAGNOSIS — N181 Chronic kidney disease, stage 1: Secondary | ICD-10-CM | POA: Diagnosis not present

## 2011-06-25 DIAGNOSIS — R269 Unspecified abnormalities of gait and mobility: Secondary | ICD-10-CM | POA: Diagnosis not present

## 2011-06-25 DIAGNOSIS — M6281 Muscle weakness (generalized): Secondary | ICD-10-CM | POA: Diagnosis not present

## 2011-06-25 DIAGNOSIS — M1610 Unilateral primary osteoarthritis, unspecified hip: Secondary | ICD-10-CM | POA: Diagnosis not present

## 2011-06-25 DIAGNOSIS — M25559 Pain in unspecified hip: Secondary | ICD-10-CM | POA: Diagnosis not present

## 2011-06-30 DIAGNOSIS — R269 Unspecified abnormalities of gait and mobility: Secondary | ICD-10-CM | POA: Diagnosis not present

## 2011-06-30 DIAGNOSIS — M25559 Pain in unspecified hip: Secondary | ICD-10-CM | POA: Diagnosis not present

## 2011-06-30 DIAGNOSIS — M6281 Muscle weakness (generalized): Secondary | ICD-10-CM | POA: Diagnosis not present

## 2011-06-30 DIAGNOSIS — M1610 Unilateral primary osteoarthritis, unspecified hip: Secondary | ICD-10-CM | POA: Diagnosis not present

## 2011-07-02 DIAGNOSIS — M1610 Unilateral primary osteoarthritis, unspecified hip: Secondary | ICD-10-CM | POA: Diagnosis not present

## 2011-07-02 DIAGNOSIS — M25559 Pain in unspecified hip: Secondary | ICD-10-CM | POA: Diagnosis not present

## 2011-07-02 DIAGNOSIS — M6281 Muscle weakness (generalized): Secondary | ICD-10-CM | POA: Diagnosis not present

## 2011-07-06 DIAGNOSIS — M779 Enthesopathy, unspecified: Secondary | ICD-10-CM | POA: Diagnosis not present

## 2011-07-06 DIAGNOSIS — M2170 Unequal limb length (acquired), unspecified site: Secondary | ICD-10-CM | POA: Diagnosis not present

## 2011-07-07 DIAGNOSIS — M1610 Unilateral primary osteoarthritis, unspecified hip: Secondary | ICD-10-CM | POA: Diagnosis not present

## 2011-07-07 DIAGNOSIS — M6281 Muscle weakness (generalized): Secondary | ICD-10-CM | POA: Diagnosis not present

## 2011-07-07 DIAGNOSIS — M25559 Pain in unspecified hip: Secondary | ICD-10-CM | POA: Diagnosis not present

## 2011-07-09 DIAGNOSIS — M545 Low back pain, unspecified: Secondary | ICD-10-CM | POA: Diagnosis not present

## 2011-07-09 DIAGNOSIS — R269 Unspecified abnormalities of gait and mobility: Secondary | ICD-10-CM | POA: Diagnosis not present

## 2011-07-09 DIAGNOSIS — M6281 Muscle weakness (generalized): Secondary | ICD-10-CM | POA: Diagnosis not present

## 2011-07-09 DIAGNOSIS — M67919 Unspecified disorder of synovium and tendon, unspecified shoulder: Secondary | ICD-10-CM | POA: Diagnosis not present

## 2011-07-14 DIAGNOSIS — R269 Unspecified abnormalities of gait and mobility: Secondary | ICD-10-CM | POA: Diagnosis not present

## 2011-07-14 DIAGNOSIS — M6281 Muscle weakness (generalized): Secondary | ICD-10-CM | POA: Diagnosis not present

## 2011-07-14 DIAGNOSIS — M1610 Unilateral primary osteoarthritis, unspecified hip: Secondary | ICD-10-CM | POA: Diagnosis not present

## 2011-07-14 DIAGNOSIS — M25559 Pain in unspecified hip: Secondary | ICD-10-CM | POA: Diagnosis not present

## 2011-07-16 DIAGNOSIS — M1610 Unilateral primary osteoarthritis, unspecified hip: Secondary | ICD-10-CM | POA: Diagnosis not present

## 2011-07-16 DIAGNOSIS — M6281 Muscle weakness (generalized): Secondary | ICD-10-CM | POA: Diagnosis not present

## 2011-07-16 DIAGNOSIS — M25559 Pain in unspecified hip: Secondary | ICD-10-CM | POA: Diagnosis not present

## 2011-07-16 DIAGNOSIS — R269 Unspecified abnormalities of gait and mobility: Secondary | ICD-10-CM | POA: Diagnosis not present

## 2011-07-21 DIAGNOSIS — M6281 Muscle weakness (generalized): Secondary | ICD-10-CM | POA: Diagnosis not present

## 2011-07-21 DIAGNOSIS — M1610 Unilateral primary osteoarthritis, unspecified hip: Secondary | ICD-10-CM | POA: Diagnosis not present

## 2011-07-21 DIAGNOSIS — R269 Unspecified abnormalities of gait and mobility: Secondary | ICD-10-CM | POA: Diagnosis not present

## 2011-07-21 DIAGNOSIS — M25559 Pain in unspecified hip: Secondary | ICD-10-CM | POA: Diagnosis not present

## 2011-07-23 DIAGNOSIS — M25559 Pain in unspecified hip: Secondary | ICD-10-CM | POA: Diagnosis not present

## 2011-07-23 DIAGNOSIS — M1610 Unilateral primary osteoarthritis, unspecified hip: Secondary | ICD-10-CM | POA: Diagnosis not present

## 2011-07-23 DIAGNOSIS — M6281 Muscle weakness (generalized): Secondary | ICD-10-CM | POA: Diagnosis not present

## 2011-07-23 DIAGNOSIS — R269 Unspecified abnormalities of gait and mobility: Secondary | ICD-10-CM | POA: Diagnosis not present

## 2011-07-30 DIAGNOSIS — M6281 Muscle weakness (generalized): Secondary | ICD-10-CM | POA: Diagnosis not present

## 2011-07-30 DIAGNOSIS — M25559 Pain in unspecified hip: Secondary | ICD-10-CM | POA: Diagnosis not present

## 2011-07-30 DIAGNOSIS — M1610 Unilateral primary osteoarthritis, unspecified hip: Secondary | ICD-10-CM | POA: Diagnosis not present

## 2011-07-30 DIAGNOSIS — R269 Unspecified abnormalities of gait and mobility: Secondary | ICD-10-CM | POA: Diagnosis not present

## 2011-08-06 DIAGNOSIS — M1610 Unilateral primary osteoarthritis, unspecified hip: Secondary | ICD-10-CM | POA: Diagnosis not present

## 2011-08-12 DIAGNOSIS — M2170 Unequal limb length (acquired), unspecified site: Secondary | ICD-10-CM | POA: Diagnosis not present

## 2011-08-12 DIAGNOSIS — M779 Enthesopathy, unspecified: Secondary | ICD-10-CM | POA: Diagnosis not present

## 2011-08-24 DIAGNOSIS — L570 Actinic keratosis: Secondary | ICD-10-CM | POA: Diagnosis not present

## 2011-08-24 DIAGNOSIS — L259 Unspecified contact dermatitis, unspecified cause: Secondary | ICD-10-CM | POA: Diagnosis not present

## 2011-08-24 DIAGNOSIS — Z85828 Personal history of other malignant neoplasm of skin: Secondary | ICD-10-CM | POA: Diagnosis not present

## 2011-08-24 DIAGNOSIS — D0460 Carcinoma in situ of skin of unspecified upper limb, including shoulder: Secondary | ICD-10-CM | POA: Diagnosis not present

## 2011-10-27 DIAGNOSIS — L259 Unspecified contact dermatitis, unspecified cause: Secondary | ICD-10-CM | POA: Diagnosis not present

## 2011-10-27 DIAGNOSIS — L821 Other seborrheic keratosis: Secondary | ICD-10-CM | POA: Diagnosis not present

## 2011-10-27 DIAGNOSIS — L570 Actinic keratosis: Secondary | ICD-10-CM | POA: Diagnosis not present

## 2011-10-27 DIAGNOSIS — Z85828 Personal history of other malignant neoplasm of skin: Secondary | ICD-10-CM | POA: Diagnosis not present

## 2011-10-30 DIAGNOSIS — Z23 Encounter for immunization: Secondary | ICD-10-CM | POA: Diagnosis not present

## 2011-12-17 DIAGNOSIS — E7800 Pure hypercholesterolemia, unspecified: Secondary | ICD-10-CM | POA: Diagnosis not present

## 2011-12-17 DIAGNOSIS — I10 Essential (primary) hypertension: Secondary | ICD-10-CM | POA: Diagnosis not present

## 2012-01-28 DIAGNOSIS — H43819 Vitreous degeneration, unspecified eye: Secondary | ICD-10-CM | POA: Diagnosis not present

## 2012-01-28 DIAGNOSIS — M2170 Unequal limb length (acquired), unspecified site: Secondary | ICD-10-CM | POA: Diagnosis not present

## 2012-01-28 DIAGNOSIS — H04129 Dry eye syndrome of unspecified lacrimal gland: Secondary | ICD-10-CM | POA: Diagnosis not present

## 2012-01-28 DIAGNOSIS — M779 Enthesopathy, unspecified: Secondary | ICD-10-CM | POA: Diagnosis not present

## 2012-01-28 DIAGNOSIS — H5210 Myopia, unspecified eye: Secondary | ICD-10-CM | POA: Diagnosis not present

## 2012-01-28 DIAGNOSIS — H2510 Age-related nuclear cataract, unspecified eye: Secondary | ICD-10-CM | POA: Diagnosis not present

## 2012-02-11 DIAGNOSIS — L570 Actinic keratosis: Secondary | ICD-10-CM | POA: Diagnosis not present

## 2012-02-11 DIAGNOSIS — Z85828 Personal history of other malignant neoplasm of skin: Secondary | ICD-10-CM | POA: Diagnosis not present

## 2012-04-15 DIAGNOSIS — N189 Chronic kidney disease, unspecified: Secondary | ICD-10-CM | POA: Diagnosis not present

## 2012-04-15 DIAGNOSIS — R3129 Other microscopic hematuria: Secondary | ICD-10-CM | POA: Diagnosis not present

## 2012-04-15 DIAGNOSIS — N400 Enlarged prostate without lower urinary tract symptoms: Secondary | ICD-10-CM | POA: Diagnosis not present

## 2012-04-15 DIAGNOSIS — N529 Male erectile dysfunction, unspecified: Secondary | ICD-10-CM | POA: Diagnosis not present

## 2012-04-18 DIAGNOSIS — L570 Actinic keratosis: Secondary | ICD-10-CM | POA: Diagnosis not present

## 2012-04-18 DIAGNOSIS — D485 Neoplasm of uncertain behavior of skin: Secondary | ICD-10-CM | POA: Diagnosis not present

## 2012-04-18 DIAGNOSIS — L820 Inflamed seborrheic keratosis: Secondary | ICD-10-CM | POA: Diagnosis not present

## 2012-04-18 DIAGNOSIS — Z85828 Personal history of other malignant neoplasm of skin: Secondary | ICD-10-CM | POA: Diagnosis not present

## 2012-05-09 DIAGNOSIS — Z111 Encounter for screening for respiratory tuberculosis: Secondary | ICD-10-CM | POA: Diagnosis not present

## 2012-05-11 DIAGNOSIS — I10 Essential (primary) hypertension: Secondary | ICD-10-CM | POA: Diagnosis not present

## 2012-06-20 DIAGNOSIS — G4700 Insomnia, unspecified: Secondary | ICD-10-CM | POA: Diagnosis not present

## 2012-06-20 DIAGNOSIS — E7800 Pure hypercholesterolemia, unspecified: Secondary | ICD-10-CM | POA: Diagnosis not present

## 2012-06-20 DIAGNOSIS — N183 Chronic kidney disease, stage 3 unspecified: Secondary | ICD-10-CM | POA: Diagnosis not present

## 2012-06-20 DIAGNOSIS — I10 Essential (primary) hypertension: Secondary | ICD-10-CM | POA: Diagnosis not present

## 2012-06-21 DIAGNOSIS — N183 Chronic kidney disease, stage 3 unspecified: Secondary | ICD-10-CM | POA: Diagnosis not present

## 2012-06-28 DIAGNOSIS — N400 Enlarged prostate without lower urinary tract symptoms: Secondary | ICD-10-CM | POA: Diagnosis not present

## 2012-06-28 DIAGNOSIS — M1990 Unspecified osteoarthritis, unspecified site: Secondary | ICD-10-CM | POA: Diagnosis not present

## 2012-06-28 DIAGNOSIS — N182 Chronic kidney disease, stage 2 (mild): Secondary | ICD-10-CM | POA: Diagnosis not present

## 2012-06-28 DIAGNOSIS — I10 Essential (primary) hypertension: Secondary | ICD-10-CM | POA: Diagnosis not present

## 2012-07-25 DIAGNOSIS — R946 Abnormal results of thyroid function studies: Secondary | ICD-10-CM | POA: Diagnosis not present

## 2012-09-13 DIAGNOSIS — E7800 Pure hypercholesterolemia, unspecified: Secondary | ICD-10-CM | POA: Diagnosis not present

## 2012-09-13 DIAGNOSIS — I129 Hypertensive chronic kidney disease with stage 1 through stage 4 chronic kidney disease, or unspecified chronic kidney disease: Secondary | ICD-10-CM | POA: Diagnosis not present

## 2012-09-13 DIAGNOSIS — N183 Chronic kidney disease, stage 3 unspecified: Secondary | ICD-10-CM | POA: Diagnosis not present

## 2012-09-13 DIAGNOSIS — M549 Dorsalgia, unspecified: Secondary | ICD-10-CM | POA: Diagnosis not present

## 2012-10-31 DIAGNOSIS — C44721 Squamous cell carcinoma of skin of unspecified lower limb, including hip: Secondary | ICD-10-CM | POA: Diagnosis not present

## 2012-10-31 DIAGNOSIS — L570 Actinic keratosis: Secondary | ICD-10-CM | POA: Diagnosis not present

## 2012-10-31 DIAGNOSIS — Z85828 Personal history of other malignant neoplasm of skin: Secondary | ICD-10-CM | POA: Diagnosis not present

## 2012-10-31 DIAGNOSIS — D485 Neoplasm of uncertain behavior of skin: Secondary | ICD-10-CM | POA: Diagnosis not present

## 2012-10-31 DIAGNOSIS — L821 Other seborrheic keratosis: Secondary | ICD-10-CM | POA: Diagnosis not present

## 2012-11-01 DIAGNOSIS — Z86010 Personal history of colonic polyps: Secondary | ICD-10-CM | POA: Diagnosis not present

## 2012-11-01 DIAGNOSIS — D126 Benign neoplasm of colon, unspecified: Secondary | ICD-10-CM | POA: Diagnosis not present

## 2012-11-25 DIAGNOSIS — R3129 Other microscopic hematuria: Secondary | ICD-10-CM | POA: Diagnosis not present

## 2012-11-25 DIAGNOSIS — R109 Unspecified abdominal pain: Secondary | ICD-10-CM | POA: Diagnosis not present

## 2012-11-28 DIAGNOSIS — Z23 Encounter for immunization: Secondary | ICD-10-CM | POA: Diagnosis not present

## 2012-12-08 DIAGNOSIS — L905 Scar conditions and fibrosis of skin: Secondary | ICD-10-CM | POA: Diagnosis not present

## 2012-12-08 DIAGNOSIS — C44721 Squamous cell carcinoma of skin of unspecified lower limb, including hip: Secondary | ICD-10-CM | POA: Diagnosis not present

## 2013-01-03 DIAGNOSIS — I129 Hypertensive chronic kidney disease with stage 1 through stage 4 chronic kidney disease, or unspecified chronic kidney disease: Secondary | ICD-10-CM | POA: Diagnosis not present

## 2013-01-03 DIAGNOSIS — N183 Chronic kidney disease, stage 3 unspecified: Secondary | ICD-10-CM | POA: Diagnosis not present

## 2013-01-03 DIAGNOSIS — E7800 Pure hypercholesterolemia, unspecified: Secondary | ICD-10-CM | POA: Diagnosis not present

## 2013-01-03 DIAGNOSIS — Z79899 Other long term (current) drug therapy: Secondary | ICD-10-CM | POA: Diagnosis not present

## 2013-01-05 DIAGNOSIS — Z79899 Other long term (current) drug therapy: Secondary | ICD-10-CM | POA: Diagnosis not present

## 2013-01-05 DIAGNOSIS — E7800 Pure hypercholesterolemia, unspecified: Secondary | ICD-10-CM | POA: Diagnosis not present

## 2013-03-07 DIAGNOSIS — Z23 Encounter for immunization: Secondary | ICD-10-CM | POA: Diagnosis not present

## 2013-03-07 DIAGNOSIS — J449 Chronic obstructive pulmonary disease, unspecified: Secondary | ICD-10-CM | POA: Diagnosis not present

## 2013-03-07 DIAGNOSIS — Z1331 Encounter for screening for depression: Secondary | ICD-10-CM | POA: Diagnosis not present

## 2013-03-07 DIAGNOSIS — Z0000 Encounter for general adult medical examination without abnormal findings: Secondary | ICD-10-CM | POA: Diagnosis not present

## 2013-05-29 DIAGNOSIS — Z7182 Exercise counseling: Secondary | ICD-10-CM | POA: Diagnosis not present

## 2013-05-29 DIAGNOSIS — Z713 Dietary counseling and surveillance: Secondary | ICD-10-CM | POA: Diagnosis not present

## 2013-05-29 DIAGNOSIS — Z6827 Body mass index (BMI) 27.0-27.9, adult: Secondary | ICD-10-CM | POA: Diagnosis not present

## 2013-05-29 DIAGNOSIS — H6120 Impacted cerumen, unspecified ear: Secondary | ICD-10-CM | POA: Diagnosis not present

## 2013-06-05 DIAGNOSIS — N400 Enlarged prostate without lower urinary tract symptoms: Secondary | ICD-10-CM | POA: Diagnosis not present

## 2013-06-05 DIAGNOSIS — N529 Male erectile dysfunction, unspecified: Secondary | ICD-10-CM | POA: Diagnosis not present

## 2013-06-12 DIAGNOSIS — D485 Neoplasm of uncertain behavior of skin: Secondary | ICD-10-CM | POA: Diagnosis not present

## 2013-06-12 DIAGNOSIS — L570 Actinic keratosis: Secondary | ICD-10-CM | POA: Diagnosis not present

## 2013-06-16 ENCOUNTER — Other Ambulatory Visit: Payer: Self-pay | Admitting: Geriatric Medicine

## 2013-06-16 ENCOUNTER — Ambulatory Visit
Admission: RE | Admit: 2013-06-16 | Discharge: 2013-06-16 | Disposition: A | Discharge status: Home / Self Care | Payer: Medicare Other | Source: Ambulatory Visit | Attending: Geriatric Medicine | Admitting: Geriatric Medicine

## 2013-06-16 DIAGNOSIS — R0989 Other specified symptoms and signs involving the circulatory and respiratory systems: Secondary | ICD-10-CM

## 2013-06-16 DIAGNOSIS — J069 Acute upper respiratory infection, unspecified: Secondary | ICD-10-CM | POA: Diagnosis not present

## 2013-06-16 DIAGNOSIS — R059 Cough, unspecified: Secondary | ICD-10-CM | POA: Diagnosis not present

## 2013-06-16 DIAGNOSIS — R05 Cough: Secondary | ICD-10-CM | POA: Diagnosis not present

## 2013-07-05 DIAGNOSIS — H905 Unspecified sensorineural hearing loss: Secondary | ICD-10-CM | POA: Diagnosis not present

## 2013-07-07 DIAGNOSIS — H2510 Age-related nuclear cataract, unspecified eye: Secondary | ICD-10-CM | POA: Diagnosis not present

## 2013-08-15 DIAGNOSIS — J209 Acute bronchitis, unspecified: Secondary | ICD-10-CM | POA: Diagnosis not present

## 2013-09-27 DIAGNOSIS — I129 Hypertensive chronic kidney disease with stage 1 through stage 4 chronic kidney disease, or unspecified chronic kidney disease: Secondary | ICD-10-CM | POA: Diagnosis not present

## 2013-09-27 DIAGNOSIS — E7800 Pure hypercholesterolemia, unspecified: Secondary | ICD-10-CM | POA: Diagnosis not present

## 2013-09-27 DIAGNOSIS — H6120 Impacted cerumen, unspecified ear: Secondary | ICD-10-CM | POA: Diagnosis not present

## 2013-09-27 DIAGNOSIS — G479 Sleep disorder, unspecified: Secondary | ICD-10-CM | POA: Diagnosis not present

## 2013-09-27 DIAGNOSIS — N183 Chronic kidney disease, stage 3 unspecified: Secondary | ICD-10-CM | POA: Diagnosis not present

## 2013-10-05 DIAGNOSIS — Z79899 Other long term (current) drug therapy: Secondary | ICD-10-CM | POA: Diagnosis not present

## 2013-10-05 DIAGNOSIS — E7800 Pure hypercholesterolemia, unspecified: Secondary | ICD-10-CM | POA: Diagnosis not present

## 2013-11-07 DIAGNOSIS — K0381 Cracked tooth: Secondary | ICD-10-CM | POA: Diagnosis not present

## 2013-11-07 DIAGNOSIS — Z0389 Encounter for observation for other suspected diseases and conditions ruled out: Secondary | ICD-10-CM | POA: Diagnosis not present

## 2013-11-07 DIAGNOSIS — S025XXA Fracture of tooth (traumatic), initial encounter for closed fracture: Secondary | ICD-10-CM | POA: Diagnosis not present

## 2013-11-09 DIAGNOSIS — L570 Actinic keratosis: Secondary | ICD-10-CM | POA: Diagnosis not present

## 2013-11-09 DIAGNOSIS — D485 Neoplasm of uncertain behavior of skin: Secondary | ICD-10-CM | POA: Diagnosis not present

## 2013-11-09 DIAGNOSIS — L821 Other seborrheic keratosis: Secondary | ICD-10-CM | POA: Diagnosis not present

## 2013-11-09 DIAGNOSIS — Z85828 Personal history of other malignant neoplasm of skin: Secondary | ICD-10-CM | POA: Diagnosis not present

## 2013-12-15 DIAGNOSIS — M79671 Pain in right foot: Secondary | ICD-10-CM | POA: Diagnosis not present

## 2013-12-15 DIAGNOSIS — B351 Tinea unguium: Secondary | ICD-10-CM | POA: Diagnosis not present

## 2013-12-15 DIAGNOSIS — M79674 Pain in right toe(s): Secondary | ICD-10-CM | POA: Diagnosis not present

## 2014-02-23 DIAGNOSIS — B351 Tinea unguium: Secondary | ICD-10-CM | POA: Diagnosis not present

## 2014-02-23 DIAGNOSIS — M79675 Pain in left toe(s): Secondary | ICD-10-CM | POA: Diagnosis not present

## 2014-04-18 DIAGNOSIS — Z77090 Contact with and (suspected) exposure to asbestos: Secondary | ICD-10-CM | POA: Diagnosis not present

## 2014-04-18 DIAGNOSIS — Z79899 Other long term (current) drug therapy: Secondary | ICD-10-CM | POA: Diagnosis not present

## 2014-04-18 DIAGNOSIS — N183 Chronic kidney disease, stage 3 (moderate): Secondary | ICD-10-CM | POA: Diagnosis not present

## 2014-04-18 DIAGNOSIS — L570 Actinic keratosis: Secondary | ICD-10-CM | POA: Diagnosis not present

## 2014-04-18 DIAGNOSIS — E780 Pure hypercholesterolemia: Secondary | ICD-10-CM | POA: Diagnosis not present

## 2014-04-18 DIAGNOSIS — Z0000 Encounter for general adult medical examination without abnormal findings: Secondary | ICD-10-CM | POA: Diagnosis not present

## 2014-04-18 DIAGNOSIS — Z1389 Encounter for screening for other disorder: Secondary | ICD-10-CM | POA: Diagnosis not present

## 2014-04-18 DIAGNOSIS — I129 Hypertensive chronic kidney disease with stage 1 through stage 4 chronic kidney disease, or unspecified chronic kidney disease: Secondary | ICD-10-CM | POA: Diagnosis not present

## 2014-04-18 DIAGNOSIS — L821 Other seborrheic keratosis: Secondary | ICD-10-CM | POA: Diagnosis not present

## 2014-04-18 DIAGNOSIS — L219 Seborrheic dermatitis, unspecified: Secondary | ICD-10-CM | POA: Diagnosis not present

## 2014-04-18 DIAGNOSIS — M545 Low back pain: Secondary | ICD-10-CM | POA: Diagnosis not present

## 2014-04-18 DIAGNOSIS — J449 Chronic obstructive pulmonary disease, unspecified: Secondary | ICD-10-CM | POA: Diagnosis not present

## 2014-04-25 DIAGNOSIS — M9904 Segmental and somatic dysfunction of sacral region: Secondary | ICD-10-CM | POA: Diagnosis not present

## 2014-04-25 DIAGNOSIS — M545 Low back pain: Secondary | ICD-10-CM | POA: Diagnosis not present

## 2014-04-25 DIAGNOSIS — M9903 Segmental and somatic dysfunction of lumbar region: Secondary | ICD-10-CM | POA: Diagnosis not present

## 2014-04-25 DIAGNOSIS — M9901 Segmental and somatic dysfunction of cervical region: Secondary | ICD-10-CM | POA: Diagnosis not present

## 2014-04-26 DIAGNOSIS — Z79899 Other long term (current) drug therapy: Secondary | ICD-10-CM | POA: Diagnosis not present

## 2014-04-27 DIAGNOSIS — B351 Tinea unguium: Secondary | ICD-10-CM | POA: Diagnosis not present

## 2014-04-27 DIAGNOSIS — M79674 Pain in right toe(s): Secondary | ICD-10-CM | POA: Diagnosis not present

## 2014-04-27 DIAGNOSIS — M79671 Pain in right foot: Secondary | ICD-10-CM | POA: Diagnosis not present

## 2014-04-30 DIAGNOSIS — M9903 Segmental and somatic dysfunction of lumbar region: Secondary | ICD-10-CM | POA: Diagnosis not present

## 2014-04-30 DIAGNOSIS — M9904 Segmental and somatic dysfunction of sacral region: Secondary | ICD-10-CM | POA: Diagnosis not present

## 2014-04-30 DIAGNOSIS — M545 Low back pain: Secondary | ICD-10-CM | POA: Diagnosis not present

## 2014-04-30 DIAGNOSIS — M9901 Segmental and somatic dysfunction of cervical region: Secondary | ICD-10-CM | POA: Diagnosis not present

## 2014-05-04 DIAGNOSIS — M9903 Segmental and somatic dysfunction of lumbar region: Secondary | ICD-10-CM | POA: Diagnosis not present

## 2014-05-04 DIAGNOSIS — M545 Low back pain: Secondary | ICD-10-CM | POA: Diagnosis not present

## 2014-05-04 DIAGNOSIS — M9904 Segmental and somatic dysfunction of sacral region: Secondary | ICD-10-CM | POA: Diagnosis not present

## 2014-05-04 DIAGNOSIS — M9901 Segmental and somatic dysfunction of cervical region: Secondary | ICD-10-CM | POA: Diagnosis not present

## 2014-05-07 DIAGNOSIS — M9903 Segmental and somatic dysfunction of lumbar region: Secondary | ICD-10-CM | POA: Diagnosis not present

## 2014-05-07 DIAGNOSIS — M545 Low back pain: Secondary | ICD-10-CM | POA: Diagnosis not present

## 2014-05-07 DIAGNOSIS — M9904 Segmental and somatic dysfunction of sacral region: Secondary | ICD-10-CM | POA: Diagnosis not present

## 2014-05-07 DIAGNOSIS — M9901 Segmental and somatic dysfunction of cervical region: Secondary | ICD-10-CM | POA: Diagnosis not present

## 2014-05-10 DIAGNOSIS — M9904 Segmental and somatic dysfunction of sacral region: Secondary | ICD-10-CM | POA: Diagnosis not present

## 2014-05-10 DIAGNOSIS — M9903 Segmental and somatic dysfunction of lumbar region: Secondary | ICD-10-CM | POA: Diagnosis not present

## 2014-05-10 DIAGNOSIS — M545 Low back pain: Secondary | ICD-10-CM | POA: Diagnosis not present

## 2014-05-10 DIAGNOSIS — M9901 Segmental and somatic dysfunction of cervical region: Secondary | ICD-10-CM | POA: Diagnosis not present

## 2014-05-14 DIAGNOSIS — M9904 Segmental and somatic dysfunction of sacral region: Secondary | ICD-10-CM | POA: Diagnosis not present

## 2014-05-14 DIAGNOSIS — M9901 Segmental and somatic dysfunction of cervical region: Secondary | ICD-10-CM | POA: Diagnosis not present

## 2014-05-14 DIAGNOSIS — M9903 Segmental and somatic dysfunction of lumbar region: Secondary | ICD-10-CM | POA: Diagnosis not present

## 2014-05-14 DIAGNOSIS — M545 Low back pain: Secondary | ICD-10-CM | POA: Diagnosis not present

## 2014-05-16 DIAGNOSIS — M545 Low back pain: Secondary | ICD-10-CM | POA: Diagnosis not present

## 2014-05-16 DIAGNOSIS — M9903 Segmental and somatic dysfunction of lumbar region: Secondary | ICD-10-CM | POA: Diagnosis not present

## 2014-05-16 DIAGNOSIS — M9904 Segmental and somatic dysfunction of sacral region: Secondary | ICD-10-CM | POA: Diagnosis not present

## 2014-05-16 DIAGNOSIS — M9901 Segmental and somatic dysfunction of cervical region: Secondary | ICD-10-CM | POA: Diagnosis not present

## 2014-05-21 DIAGNOSIS — M9903 Segmental and somatic dysfunction of lumbar region: Secondary | ICD-10-CM | POA: Diagnosis not present

## 2014-05-21 DIAGNOSIS — M9901 Segmental and somatic dysfunction of cervical region: Secondary | ICD-10-CM | POA: Diagnosis not present

## 2014-05-21 DIAGNOSIS — M545 Low back pain: Secondary | ICD-10-CM | POA: Diagnosis not present

## 2014-05-21 DIAGNOSIS — M9904 Segmental and somatic dysfunction of sacral region: Secondary | ICD-10-CM | POA: Diagnosis not present

## 2014-05-25 DIAGNOSIS — M9903 Segmental and somatic dysfunction of lumbar region: Secondary | ICD-10-CM | POA: Diagnosis not present

## 2014-05-25 DIAGNOSIS — M545 Low back pain: Secondary | ICD-10-CM | POA: Diagnosis not present

## 2014-05-25 DIAGNOSIS — M9901 Segmental and somatic dysfunction of cervical region: Secondary | ICD-10-CM | POA: Diagnosis not present

## 2014-05-25 DIAGNOSIS — M9904 Segmental and somatic dysfunction of sacral region: Secondary | ICD-10-CM | POA: Diagnosis not present

## 2014-05-29 DIAGNOSIS — M9903 Segmental and somatic dysfunction of lumbar region: Secondary | ICD-10-CM | POA: Diagnosis not present

## 2014-05-29 DIAGNOSIS — M545 Low back pain: Secondary | ICD-10-CM | POA: Diagnosis not present

## 2014-05-29 DIAGNOSIS — M9904 Segmental and somatic dysfunction of sacral region: Secondary | ICD-10-CM | POA: Diagnosis not present

## 2014-05-29 DIAGNOSIS — M9901 Segmental and somatic dysfunction of cervical region: Secondary | ICD-10-CM | POA: Diagnosis not present

## 2014-06-01 DIAGNOSIS — M9901 Segmental and somatic dysfunction of cervical region: Secondary | ICD-10-CM | POA: Diagnosis not present

## 2014-06-01 DIAGNOSIS — M9904 Segmental and somatic dysfunction of sacral region: Secondary | ICD-10-CM | POA: Diagnosis not present

## 2014-06-01 DIAGNOSIS — M545 Low back pain: Secondary | ICD-10-CM | POA: Diagnosis not present

## 2014-06-01 DIAGNOSIS — M9903 Segmental and somatic dysfunction of lumbar region: Secondary | ICD-10-CM | POA: Diagnosis not present

## 2014-06-04 DIAGNOSIS — M9901 Segmental and somatic dysfunction of cervical region: Secondary | ICD-10-CM | POA: Diagnosis not present

## 2014-06-04 DIAGNOSIS — M545 Low back pain: Secondary | ICD-10-CM | POA: Diagnosis not present

## 2014-06-04 DIAGNOSIS — M9903 Segmental and somatic dysfunction of lumbar region: Secondary | ICD-10-CM | POA: Diagnosis not present

## 2014-06-04 DIAGNOSIS — M9904 Segmental and somatic dysfunction of sacral region: Secondary | ICD-10-CM | POA: Diagnosis not present

## 2014-06-08 DIAGNOSIS — M9901 Segmental and somatic dysfunction of cervical region: Secondary | ICD-10-CM | POA: Diagnosis not present

## 2014-06-08 DIAGNOSIS — M9904 Segmental and somatic dysfunction of sacral region: Secondary | ICD-10-CM | POA: Diagnosis not present

## 2014-06-08 DIAGNOSIS — M545 Low back pain: Secondary | ICD-10-CM | POA: Diagnosis not present

## 2014-06-08 DIAGNOSIS — M9903 Segmental and somatic dysfunction of lumbar region: Secondary | ICD-10-CM | POA: Diagnosis not present

## 2014-06-12 DIAGNOSIS — M545 Low back pain: Secondary | ICD-10-CM | POA: Diagnosis not present

## 2014-06-12 DIAGNOSIS — M9901 Segmental and somatic dysfunction of cervical region: Secondary | ICD-10-CM | POA: Diagnosis not present

## 2014-06-12 DIAGNOSIS — M9904 Segmental and somatic dysfunction of sacral region: Secondary | ICD-10-CM | POA: Diagnosis not present

## 2014-06-12 DIAGNOSIS — M9903 Segmental and somatic dysfunction of lumbar region: Secondary | ICD-10-CM | POA: Diagnosis not present

## 2014-06-13 DIAGNOSIS — N400 Enlarged prostate without lower urinary tract symptoms: Secondary | ICD-10-CM | POA: Diagnosis not present

## 2014-06-13 DIAGNOSIS — N5201 Erectile dysfunction due to arterial insufficiency: Secondary | ICD-10-CM | POA: Diagnosis not present

## 2014-06-19 DIAGNOSIS — M545 Low back pain: Secondary | ICD-10-CM | POA: Diagnosis not present

## 2014-06-19 DIAGNOSIS — M9903 Segmental and somatic dysfunction of lumbar region: Secondary | ICD-10-CM | POA: Diagnosis not present

## 2014-06-19 DIAGNOSIS — M9901 Segmental and somatic dysfunction of cervical region: Secondary | ICD-10-CM | POA: Diagnosis not present

## 2014-06-19 DIAGNOSIS — M9904 Segmental and somatic dysfunction of sacral region: Secondary | ICD-10-CM | POA: Diagnosis not present

## 2014-06-26 DIAGNOSIS — M9904 Segmental and somatic dysfunction of sacral region: Secondary | ICD-10-CM | POA: Diagnosis not present

## 2014-06-26 DIAGNOSIS — M9901 Segmental and somatic dysfunction of cervical region: Secondary | ICD-10-CM | POA: Diagnosis not present

## 2014-06-26 DIAGNOSIS — M545 Low back pain: Secondary | ICD-10-CM | POA: Diagnosis not present

## 2014-06-26 DIAGNOSIS — M9903 Segmental and somatic dysfunction of lumbar region: Secondary | ICD-10-CM | POA: Diagnosis not present

## 2014-07-03 DIAGNOSIS — M9904 Segmental and somatic dysfunction of sacral region: Secondary | ICD-10-CM | POA: Diagnosis not present

## 2014-07-03 DIAGNOSIS — M9901 Segmental and somatic dysfunction of cervical region: Secondary | ICD-10-CM | POA: Diagnosis not present

## 2014-07-03 DIAGNOSIS — M545 Low back pain: Secondary | ICD-10-CM | POA: Diagnosis not present

## 2014-07-03 DIAGNOSIS — M9903 Segmental and somatic dysfunction of lumbar region: Secondary | ICD-10-CM | POA: Diagnosis not present

## 2014-07-17 DIAGNOSIS — H2513 Age-related nuclear cataract, bilateral: Secondary | ICD-10-CM | POA: Diagnosis not present

## 2014-07-17 DIAGNOSIS — H524 Presbyopia: Secondary | ICD-10-CM | POA: Diagnosis not present

## 2014-07-18 DIAGNOSIS — C61 Malignant neoplasm of prostate: Secondary | ICD-10-CM

## 2014-07-18 HISTORY — PX: PROSTATE BIOPSY: SHX241

## 2014-07-18 HISTORY — DX: Malignant neoplasm of prostate: C61

## 2014-07-20 DIAGNOSIS — B351 Tinea unguium: Secondary | ICD-10-CM | POA: Diagnosis not present

## 2014-07-20 DIAGNOSIS — M79674 Pain in right toe(s): Secondary | ICD-10-CM | POA: Diagnosis not present

## 2014-07-20 DIAGNOSIS — M79675 Pain in left toe(s): Secondary | ICD-10-CM | POA: Diagnosis not present

## 2014-07-23 DIAGNOSIS — R972 Elevated prostate specific antigen [PSA]: Secondary | ICD-10-CM | POA: Diagnosis not present

## 2014-07-23 DIAGNOSIS — C61 Malignant neoplasm of prostate: Secondary | ICD-10-CM | POA: Diagnosis not present

## 2014-07-31 DIAGNOSIS — M545 Low back pain: Secondary | ICD-10-CM | POA: Diagnosis not present

## 2014-07-31 DIAGNOSIS — M9903 Segmental and somatic dysfunction of lumbar region: Secondary | ICD-10-CM | POA: Diagnosis not present

## 2014-07-31 DIAGNOSIS — M9904 Segmental and somatic dysfunction of sacral region: Secondary | ICD-10-CM | POA: Diagnosis not present

## 2014-07-31 DIAGNOSIS — M9901 Segmental and somatic dysfunction of cervical region: Secondary | ICD-10-CM | POA: Diagnosis not present

## 2014-08-30 DIAGNOSIS — M9901 Segmental and somatic dysfunction of cervical region: Secondary | ICD-10-CM | POA: Diagnosis not present

## 2014-08-30 DIAGNOSIS — M9903 Segmental and somatic dysfunction of lumbar region: Secondary | ICD-10-CM | POA: Diagnosis not present

## 2014-08-30 DIAGNOSIS — M545 Low back pain: Secondary | ICD-10-CM | POA: Diagnosis not present

## 2014-08-30 DIAGNOSIS — M9904 Segmental and somatic dysfunction of sacral region: Secondary | ICD-10-CM | POA: Diagnosis not present

## 2014-09-03 DIAGNOSIS — M545 Low back pain: Secondary | ICD-10-CM | POA: Diagnosis not present

## 2014-09-03 DIAGNOSIS — M25551 Pain in right hip: Secondary | ICD-10-CM | POA: Diagnosis not present

## 2014-09-05 DIAGNOSIS — M25552 Pain in left hip: Secondary | ICD-10-CM | POA: Diagnosis not present

## 2014-09-05 DIAGNOSIS — M6281 Muscle weakness (generalized): Secondary | ICD-10-CM | POA: Diagnosis not present

## 2014-09-05 DIAGNOSIS — M545 Low back pain: Secondary | ICD-10-CM | POA: Diagnosis not present

## 2014-09-05 DIAGNOSIS — R2689 Other abnormalities of gait and mobility: Secondary | ICD-10-CM | POA: Diagnosis not present

## 2014-09-07 DIAGNOSIS — R2689 Other abnormalities of gait and mobility: Secondary | ICD-10-CM | POA: Diagnosis not present

## 2014-09-07 DIAGNOSIS — M25552 Pain in left hip: Secondary | ICD-10-CM | POA: Diagnosis not present

## 2014-09-07 DIAGNOSIS — M6281 Muscle weakness (generalized): Secondary | ICD-10-CM | POA: Diagnosis not present

## 2014-09-07 DIAGNOSIS — M545 Low back pain: Secondary | ICD-10-CM | POA: Diagnosis not present

## 2014-09-10 DIAGNOSIS — C61 Malignant neoplasm of prostate: Secondary | ICD-10-CM | POA: Diagnosis not present

## 2014-09-12 DIAGNOSIS — M6281 Muscle weakness (generalized): Secondary | ICD-10-CM | POA: Diagnosis not present

## 2014-09-12 DIAGNOSIS — M25552 Pain in left hip: Secondary | ICD-10-CM | POA: Diagnosis not present

## 2014-09-12 DIAGNOSIS — M545 Low back pain: Secondary | ICD-10-CM | POA: Diagnosis not present

## 2014-09-12 DIAGNOSIS — R2689 Other abnormalities of gait and mobility: Secondary | ICD-10-CM | POA: Diagnosis not present

## 2014-09-14 DIAGNOSIS — R2689 Other abnormalities of gait and mobility: Secondary | ICD-10-CM | POA: Diagnosis not present

## 2014-09-14 DIAGNOSIS — M25552 Pain in left hip: Secondary | ICD-10-CM | POA: Diagnosis not present

## 2014-09-14 DIAGNOSIS — M545 Low back pain: Secondary | ICD-10-CM | POA: Diagnosis not present

## 2014-09-14 DIAGNOSIS — M6281 Muscle weakness (generalized): Secondary | ICD-10-CM | POA: Diagnosis not present

## 2014-09-19 DIAGNOSIS — M6281 Muscle weakness (generalized): Secondary | ICD-10-CM | POA: Diagnosis not present

## 2014-09-20 DIAGNOSIS — C61 Malignant neoplasm of prostate: Secondary | ICD-10-CM | POA: Diagnosis not present

## 2014-09-21 DIAGNOSIS — M79675 Pain in left toe(s): Secondary | ICD-10-CM | POA: Diagnosis not present

## 2014-09-21 DIAGNOSIS — M79672 Pain in left foot: Secondary | ICD-10-CM | POA: Diagnosis not present

## 2014-09-21 DIAGNOSIS — M6281 Muscle weakness (generalized): Secondary | ICD-10-CM | POA: Diagnosis not present

## 2014-09-21 DIAGNOSIS — B351 Tinea unguium: Secondary | ICD-10-CM | POA: Diagnosis not present

## 2014-09-26 DIAGNOSIS — M9901 Segmental and somatic dysfunction of cervical region: Secondary | ICD-10-CM | POA: Diagnosis not present

## 2014-09-26 DIAGNOSIS — M9903 Segmental and somatic dysfunction of lumbar region: Secondary | ICD-10-CM | POA: Diagnosis not present

## 2014-09-26 DIAGNOSIS — M545 Low back pain: Secondary | ICD-10-CM | POA: Diagnosis not present

## 2014-09-26 DIAGNOSIS — M9904 Segmental and somatic dysfunction of sacral region: Secondary | ICD-10-CM | POA: Diagnosis not present

## 2014-09-26 DIAGNOSIS — M6281 Muscle weakness (generalized): Secondary | ICD-10-CM | POA: Diagnosis not present

## 2014-10-03 DIAGNOSIS — C61 Malignant neoplasm of prostate: Secondary | ICD-10-CM | POA: Diagnosis not present

## 2014-10-05 DIAGNOSIS — M6281 Muscle weakness (generalized): Secondary | ICD-10-CM | POA: Diagnosis not present

## 2014-10-19 DIAGNOSIS — M545 Low back pain: Secondary | ICD-10-CM | POA: Diagnosis not present

## 2014-10-19 DIAGNOSIS — M9904 Segmental and somatic dysfunction of sacral region: Secondary | ICD-10-CM | POA: Diagnosis not present

## 2014-10-19 DIAGNOSIS — M9901 Segmental and somatic dysfunction of cervical region: Secondary | ICD-10-CM | POA: Diagnosis not present

## 2014-10-19 DIAGNOSIS — M9903 Segmental and somatic dysfunction of lumbar region: Secondary | ICD-10-CM | POA: Diagnosis not present

## 2014-10-23 DIAGNOSIS — M9903 Segmental and somatic dysfunction of lumbar region: Secondary | ICD-10-CM | POA: Diagnosis not present

## 2014-10-23 DIAGNOSIS — M9901 Segmental and somatic dysfunction of cervical region: Secondary | ICD-10-CM | POA: Diagnosis not present

## 2014-10-23 DIAGNOSIS — M9904 Segmental and somatic dysfunction of sacral region: Secondary | ICD-10-CM | POA: Diagnosis not present

## 2014-10-23 DIAGNOSIS — M545 Low back pain: Secondary | ICD-10-CM | POA: Diagnosis not present

## 2014-10-24 DIAGNOSIS — N183 Chronic kidney disease, stage 3 (moderate): Secondary | ICD-10-CM | POA: Diagnosis not present

## 2014-10-24 DIAGNOSIS — C61 Malignant neoplasm of prostate: Secondary | ICD-10-CM | POA: Diagnosis not present

## 2014-10-24 DIAGNOSIS — Z79899 Other long term (current) drug therapy: Secondary | ICD-10-CM | POA: Diagnosis not present

## 2014-10-24 DIAGNOSIS — I129 Hypertensive chronic kidney disease with stage 1 through stage 4 chronic kidney disease, or unspecified chronic kidney disease: Secondary | ICD-10-CM | POA: Diagnosis not present

## 2014-10-24 DIAGNOSIS — M25552 Pain in left hip: Secondary | ICD-10-CM | POA: Diagnosis not present

## 2014-11-01 DIAGNOSIS — M545 Low back pain: Secondary | ICD-10-CM | POA: Diagnosis not present

## 2014-11-01 DIAGNOSIS — M9903 Segmental and somatic dysfunction of lumbar region: Secondary | ICD-10-CM | POA: Diagnosis not present

## 2014-11-01 DIAGNOSIS — M9904 Segmental and somatic dysfunction of sacral region: Secondary | ICD-10-CM | POA: Diagnosis not present

## 2014-11-01 DIAGNOSIS — M9901 Segmental and somatic dysfunction of cervical region: Secondary | ICD-10-CM | POA: Diagnosis not present

## 2014-11-20 DIAGNOSIS — M545 Low back pain: Secondary | ICD-10-CM | POA: Diagnosis not present

## 2014-11-20 DIAGNOSIS — M9904 Segmental and somatic dysfunction of sacral region: Secondary | ICD-10-CM | POA: Diagnosis not present

## 2014-11-20 DIAGNOSIS — M9903 Segmental and somatic dysfunction of lumbar region: Secondary | ICD-10-CM | POA: Diagnosis not present

## 2014-11-20 DIAGNOSIS — M9901 Segmental and somatic dysfunction of cervical region: Secondary | ICD-10-CM | POA: Diagnosis not present

## 2014-11-23 DIAGNOSIS — B351 Tinea unguium: Secondary | ICD-10-CM | POA: Diagnosis not present

## 2014-11-23 DIAGNOSIS — M79671 Pain in right foot: Secondary | ICD-10-CM | POA: Diagnosis not present

## 2014-11-23 DIAGNOSIS — M79674 Pain in right toe(s): Secondary | ICD-10-CM | POA: Diagnosis not present

## 2014-12-03 DIAGNOSIS — L821 Other seborrheic keratosis: Secondary | ICD-10-CM | POA: Diagnosis not present

## 2014-12-03 DIAGNOSIS — L570 Actinic keratosis: Secondary | ICD-10-CM | POA: Diagnosis not present

## 2014-12-03 DIAGNOSIS — D239 Other benign neoplasm of skin, unspecified: Secondary | ICD-10-CM | POA: Diagnosis not present

## 2014-12-03 DIAGNOSIS — D1801 Hemangioma of skin and subcutaneous tissue: Secondary | ICD-10-CM | POA: Diagnosis not present

## 2014-12-03 DIAGNOSIS — Z85828 Personal history of other malignant neoplasm of skin: Secondary | ICD-10-CM | POA: Diagnosis not present

## 2014-12-19 DIAGNOSIS — M9901 Segmental and somatic dysfunction of cervical region: Secondary | ICD-10-CM | POA: Diagnosis not present

## 2014-12-19 DIAGNOSIS — M9903 Segmental and somatic dysfunction of lumbar region: Secondary | ICD-10-CM | POA: Diagnosis not present

## 2014-12-19 DIAGNOSIS — M9904 Segmental and somatic dysfunction of sacral region: Secondary | ICD-10-CM | POA: Diagnosis not present

## 2014-12-19 DIAGNOSIS — M545 Low back pain: Secondary | ICD-10-CM | POA: Diagnosis not present

## 2014-12-21 DIAGNOSIS — K5730 Diverticulosis of large intestine without perforation or abscess without bleeding: Secondary | ICD-10-CM | POA: Diagnosis not present

## 2014-12-21 DIAGNOSIS — Z86010 Personal history of colonic polyps: Secondary | ICD-10-CM | POA: Diagnosis not present

## 2014-12-21 DIAGNOSIS — D122 Benign neoplasm of ascending colon: Secondary | ICD-10-CM | POA: Diagnosis not present

## 2014-12-21 DIAGNOSIS — D123 Benign neoplasm of transverse colon: Secondary | ICD-10-CM | POA: Diagnosis not present

## 2014-12-21 DIAGNOSIS — D126 Benign neoplasm of colon, unspecified: Secondary | ICD-10-CM | POA: Diagnosis not present

## 2014-12-21 DIAGNOSIS — Z09 Encounter for follow-up examination after completed treatment for conditions other than malignant neoplasm: Secondary | ICD-10-CM | POA: Diagnosis not present

## 2015-01-16 DIAGNOSIS — M9904 Segmental and somatic dysfunction of sacral region: Secondary | ICD-10-CM | POA: Diagnosis not present

## 2015-01-16 DIAGNOSIS — M9903 Segmental and somatic dysfunction of lumbar region: Secondary | ICD-10-CM | POA: Diagnosis not present

## 2015-01-16 DIAGNOSIS — M9901 Segmental and somatic dysfunction of cervical region: Secondary | ICD-10-CM | POA: Diagnosis not present

## 2015-01-16 DIAGNOSIS — M545 Low back pain: Secondary | ICD-10-CM | POA: Diagnosis not present

## 2015-01-25 DIAGNOSIS — M79675 Pain in left toe(s): Secondary | ICD-10-CM | POA: Diagnosis not present

## 2015-01-25 DIAGNOSIS — B351 Tinea unguium: Secondary | ICD-10-CM | POA: Diagnosis not present

## 2015-02-07 DIAGNOSIS — C61 Malignant neoplasm of prostate: Secondary | ICD-10-CM | POA: Diagnosis not present

## 2015-02-13 DIAGNOSIS — N139 Obstructive and reflux uropathy, unspecified: Secondary | ICD-10-CM | POA: Diagnosis not present

## 2015-02-13 DIAGNOSIS — C61 Malignant neoplasm of prostate: Secondary | ICD-10-CM | POA: Diagnosis not present

## 2015-02-13 DIAGNOSIS — N401 Enlarged prostate with lower urinary tract symptoms: Secondary | ICD-10-CM | POA: Diagnosis not present

## 2015-02-13 DIAGNOSIS — N5201 Erectile dysfunction due to arterial insufficiency: Secondary | ICD-10-CM | POA: Diagnosis not present

## 2015-02-20 DIAGNOSIS — M9904 Segmental and somatic dysfunction of sacral region: Secondary | ICD-10-CM | POA: Diagnosis not present

## 2015-02-20 DIAGNOSIS — M9901 Segmental and somatic dysfunction of cervical region: Secondary | ICD-10-CM | POA: Diagnosis not present

## 2015-02-20 DIAGNOSIS — M545 Low back pain: Secondary | ICD-10-CM | POA: Diagnosis not present

## 2015-02-20 DIAGNOSIS — M9903 Segmental and somatic dysfunction of lumbar region: Secondary | ICD-10-CM | POA: Diagnosis not present

## 2015-03-19 DIAGNOSIS — M9901 Segmental and somatic dysfunction of cervical region: Secondary | ICD-10-CM | POA: Diagnosis not present

## 2015-03-19 DIAGNOSIS — M9904 Segmental and somatic dysfunction of sacral region: Secondary | ICD-10-CM | POA: Diagnosis not present

## 2015-03-19 DIAGNOSIS — M9903 Segmental and somatic dysfunction of lumbar region: Secondary | ICD-10-CM | POA: Diagnosis not present

## 2015-03-19 DIAGNOSIS — M545 Low back pain: Secondary | ICD-10-CM | POA: Diagnosis not present

## 2015-03-29 DIAGNOSIS — B351 Tinea unguium: Secondary | ICD-10-CM | POA: Diagnosis not present

## 2015-03-29 DIAGNOSIS — M79674 Pain in right toe(s): Secondary | ICD-10-CM | POA: Diagnosis not present

## 2015-03-29 DIAGNOSIS — M79675 Pain in left toe(s): Secondary | ICD-10-CM | POA: Diagnosis not present

## 2015-04-10 DIAGNOSIS — M545 Low back pain: Secondary | ICD-10-CM | POA: Diagnosis not present

## 2015-04-10 DIAGNOSIS — M9903 Segmental and somatic dysfunction of lumbar region: Secondary | ICD-10-CM | POA: Diagnosis not present

## 2015-04-10 DIAGNOSIS — M9901 Segmental and somatic dysfunction of cervical region: Secondary | ICD-10-CM | POA: Diagnosis not present

## 2015-04-10 DIAGNOSIS — M9904 Segmental and somatic dysfunction of sacral region: Secondary | ICD-10-CM | POA: Diagnosis not present

## 2015-05-01 DIAGNOSIS — M9901 Segmental and somatic dysfunction of cervical region: Secondary | ICD-10-CM | POA: Diagnosis not present

## 2015-05-01 DIAGNOSIS — M545 Low back pain: Secondary | ICD-10-CM | POA: Diagnosis not present

## 2015-05-01 DIAGNOSIS — M9904 Segmental and somatic dysfunction of sacral region: Secondary | ICD-10-CM | POA: Diagnosis not present

## 2015-05-01 DIAGNOSIS — M9903 Segmental and somatic dysfunction of lumbar region: Secondary | ICD-10-CM | POA: Diagnosis not present

## 2015-05-29 DIAGNOSIS — M9901 Segmental and somatic dysfunction of cervical region: Secondary | ICD-10-CM | POA: Diagnosis not present

## 2015-05-29 DIAGNOSIS — M545 Low back pain: Secondary | ICD-10-CM | POA: Diagnosis not present

## 2015-05-29 DIAGNOSIS — M9903 Segmental and somatic dysfunction of lumbar region: Secondary | ICD-10-CM | POA: Diagnosis not present

## 2015-05-29 DIAGNOSIS — M9904 Segmental and somatic dysfunction of sacral region: Secondary | ICD-10-CM | POA: Diagnosis not present

## 2015-05-30 DIAGNOSIS — C61 Malignant neoplasm of prostate: Secondary | ICD-10-CM | POA: Diagnosis not present

## 2015-06-05 DIAGNOSIS — Z1389 Encounter for screening for other disorder: Secondary | ICD-10-CM | POA: Diagnosis not present

## 2015-06-05 DIAGNOSIS — N183 Chronic kidney disease, stage 3 (moderate): Secondary | ICD-10-CM | POA: Diagnosis not present

## 2015-06-05 DIAGNOSIS — J449 Chronic obstructive pulmonary disease, unspecified: Secondary | ICD-10-CM | POA: Diagnosis not present

## 2015-06-05 DIAGNOSIS — Z789 Other specified health status: Secondary | ICD-10-CM | POA: Diagnosis not present

## 2015-06-05 DIAGNOSIS — I129 Hypertensive chronic kidney disease with stage 1 through stage 4 chronic kidney disease, or unspecified chronic kidney disease: Secondary | ICD-10-CM | POA: Diagnosis not present

## 2015-06-05 DIAGNOSIS — Z79899 Other long term (current) drug therapy: Secondary | ICD-10-CM | POA: Diagnosis not present

## 2015-06-05 DIAGNOSIS — Z0000 Encounter for general adult medical examination without abnormal findings: Secondary | ICD-10-CM | POA: Diagnosis not present

## 2015-06-05 DIAGNOSIS — H2589 Other age-related cataract: Secondary | ICD-10-CM | POA: Diagnosis not present

## 2015-06-05 DIAGNOSIS — Z77090 Contact with and (suspected) exposure to asbestos: Secondary | ICD-10-CM | POA: Diagnosis not present

## 2015-06-05 DIAGNOSIS — E7800 Pure hypercholesterolemia, unspecified: Secondary | ICD-10-CM | POA: Diagnosis not present

## 2015-06-07 DIAGNOSIS — B351 Tinea unguium: Secondary | ICD-10-CM | POA: Diagnosis not present

## 2015-06-07 DIAGNOSIS — M79674 Pain in right toe(s): Secondary | ICD-10-CM | POA: Diagnosis not present

## 2015-06-07 DIAGNOSIS — M79675 Pain in left toe(s): Secondary | ICD-10-CM | POA: Diagnosis not present

## 2015-06-12 DIAGNOSIS — N139 Obstructive and reflux uropathy, unspecified: Secondary | ICD-10-CM | POA: Diagnosis not present

## 2015-06-12 DIAGNOSIS — C61 Malignant neoplasm of prostate: Secondary | ICD-10-CM | POA: Diagnosis not present

## 2015-06-12 DIAGNOSIS — N401 Enlarged prostate with lower urinary tract symptoms: Secondary | ICD-10-CM | POA: Diagnosis not present

## 2015-06-26 DIAGNOSIS — M9904 Segmental and somatic dysfunction of sacral region: Secondary | ICD-10-CM | POA: Diagnosis not present

## 2015-06-26 DIAGNOSIS — M9903 Segmental and somatic dysfunction of lumbar region: Secondary | ICD-10-CM | POA: Diagnosis not present

## 2015-06-26 DIAGNOSIS — M545 Low back pain: Secondary | ICD-10-CM | POA: Diagnosis not present

## 2015-06-26 DIAGNOSIS — M9901 Segmental and somatic dysfunction of cervical region: Secondary | ICD-10-CM | POA: Diagnosis not present

## 2015-07-03 DIAGNOSIS — Z79899 Other long term (current) drug therapy: Secondary | ICD-10-CM | POA: Diagnosis not present

## 2015-07-31 DIAGNOSIS — M545 Low back pain: Secondary | ICD-10-CM | POA: Diagnosis not present

## 2015-07-31 DIAGNOSIS — M9901 Segmental and somatic dysfunction of cervical region: Secondary | ICD-10-CM | POA: Diagnosis not present

## 2015-07-31 DIAGNOSIS — M9903 Segmental and somatic dysfunction of lumbar region: Secondary | ICD-10-CM | POA: Diagnosis not present

## 2015-07-31 DIAGNOSIS — M9904 Segmental and somatic dysfunction of sacral region: Secondary | ICD-10-CM | POA: Diagnosis not present

## 2015-08-02 ENCOUNTER — Other Ambulatory Visit: Payer: Self-pay | Admitting: Urology

## 2015-08-02 DIAGNOSIS — C61 Malignant neoplasm of prostate: Secondary | ICD-10-CM

## 2015-08-09 DIAGNOSIS — H25013 Cortical age-related cataract, bilateral: Secondary | ICD-10-CM | POA: Diagnosis not present

## 2015-08-09 DIAGNOSIS — H2513 Age-related nuclear cataract, bilateral: Secondary | ICD-10-CM | POA: Diagnosis not present

## 2015-08-09 DIAGNOSIS — Z0100 Encounter for examination of eyes and vision without abnormal findings: Secondary | ICD-10-CM | POA: Diagnosis not present

## 2015-08-09 DIAGNOSIS — H1859 Other hereditary corneal dystrophies: Secondary | ICD-10-CM | POA: Diagnosis not present

## 2015-08-16 DIAGNOSIS — B351 Tinea unguium: Secondary | ICD-10-CM | POA: Diagnosis not present

## 2015-08-16 DIAGNOSIS — M79674 Pain in right toe(s): Secondary | ICD-10-CM | POA: Diagnosis not present

## 2015-08-16 DIAGNOSIS — M79675 Pain in left toe(s): Secondary | ICD-10-CM | POA: Diagnosis not present

## 2015-08-26 DIAGNOSIS — M9901 Segmental and somatic dysfunction of cervical region: Secondary | ICD-10-CM | POA: Diagnosis not present

## 2015-08-26 DIAGNOSIS — M545 Low back pain: Secondary | ICD-10-CM | POA: Diagnosis not present

## 2015-08-26 DIAGNOSIS — M9904 Segmental and somatic dysfunction of sacral region: Secondary | ICD-10-CM | POA: Diagnosis not present

## 2015-08-26 DIAGNOSIS — M9903 Segmental and somatic dysfunction of lumbar region: Secondary | ICD-10-CM | POA: Diagnosis not present

## 2015-08-27 DIAGNOSIS — H21562 Pupillary abnormality, left eye: Secondary | ICD-10-CM | POA: Diagnosis not present

## 2015-08-27 DIAGNOSIS — H2512 Age-related nuclear cataract, left eye: Secondary | ICD-10-CM | POA: Diagnosis not present

## 2015-08-27 DIAGNOSIS — H25812 Combined forms of age-related cataract, left eye: Secondary | ICD-10-CM | POA: Diagnosis not present

## 2015-08-27 DIAGNOSIS — H25012 Cortical age-related cataract, left eye: Secondary | ICD-10-CM | POA: Diagnosis not present

## 2015-08-27 HISTORY — PX: CATARACT EXTRACTION W/ INTRAOCULAR LENS IMPLANT: SHX1309

## 2015-08-30 ENCOUNTER — Encounter (HOSPITAL_BASED_OUTPATIENT_CLINIC_OR_DEPARTMENT_OTHER): Payer: Self-pay | Admitting: *Deleted

## 2015-08-30 ENCOUNTER — Emergency Department (HOSPITAL_BASED_OUTPATIENT_CLINIC_OR_DEPARTMENT_OTHER): Payer: Medicare Other

## 2015-08-30 ENCOUNTER — Observation Stay (HOSPITAL_BASED_OUTPATIENT_CLINIC_OR_DEPARTMENT_OTHER)
Admission: EM | Admit: 2015-08-30 | Discharge: 2015-08-31 | Disposition: A | Discharge status: Home / Self Care | Payer: Medicare Other | Attending: Internal Medicine | Admitting: Internal Medicine

## 2015-08-30 DIAGNOSIS — Z7982 Long term (current) use of aspirin: Secondary | ICD-10-CM | POA: Insufficient documentation

## 2015-08-30 DIAGNOSIS — N183 Chronic kidney disease, stage 3 unspecified: Secondary | ICD-10-CM | POA: Diagnosis present

## 2015-08-30 DIAGNOSIS — R079 Chest pain, unspecified: Secondary | ICD-10-CM | POA: Diagnosis not present

## 2015-08-30 DIAGNOSIS — E785 Hyperlipidemia, unspecified: Secondary | ICD-10-CM | POA: Diagnosis not present

## 2015-08-30 DIAGNOSIS — J449 Chronic obstructive pulmonary disease, unspecified: Secondary | ICD-10-CM | POA: Insufficient documentation

## 2015-08-30 DIAGNOSIS — Z87891 Personal history of nicotine dependence: Secondary | ICD-10-CM | POA: Diagnosis not present

## 2015-08-30 DIAGNOSIS — I10 Essential (primary) hypertension: Secondary | ICD-10-CM | POA: Diagnosis present

## 2015-08-30 DIAGNOSIS — I129 Hypertensive chronic kidney disease with stage 1 through stage 4 chronic kidney disease, or unspecified chronic kidney disease: Secondary | ICD-10-CM | POA: Diagnosis not present

## 2015-08-30 DIAGNOSIS — R0602 Shortness of breath: Secondary | ICD-10-CM | POA: Insufficient documentation

## 2015-08-30 DIAGNOSIS — Z79899 Other long term (current) drug therapy: Secondary | ICD-10-CM | POA: Diagnosis not present

## 2015-08-30 DIAGNOSIS — R0789 Other chest pain: Principal | ICD-10-CM | POA: Insufficient documentation

## 2015-08-30 DIAGNOSIS — N1831 Chronic kidney disease, stage 3a: Secondary | ICD-10-CM | POA: Diagnosis present

## 2015-08-30 HISTORY — DX: Chronic kidney disease, stage 3 unspecified: N18.30

## 2015-08-30 HISTORY — DX: Other chronic pain: G89.29

## 2015-08-30 HISTORY — DX: Polyp of colon: K63.5

## 2015-08-30 HISTORY — DX: Malignant neoplasm of prostate: C61

## 2015-08-30 HISTORY — DX: Low back pain, unspecified: M54.50

## 2015-08-30 HISTORY — DX: Hyperlipidemia, unspecified: E78.5

## 2015-08-30 HISTORY — DX: Essential (primary) hypertension: I10

## 2015-08-30 HISTORY — DX: Low back pain: M54.5

## 2015-08-30 HISTORY — DX: Chronic kidney disease, stage 3 (moderate): N18.3

## 2015-08-30 LAB — COMPREHENSIVE METABOLIC PANEL
ALT: 20 U/L (ref 17–63)
AST: 20 U/L (ref 15–41)
Albumin: 4.3 g/dL (ref 3.5–5.0)
Alkaline Phosphatase: 44 U/L (ref 38–126)
Anion gap: 6 (ref 5–15)
BUN: 24 mg/dL — ABNORMAL HIGH (ref 6–20)
CO2: 27 mmol/L (ref 22–32)
Calcium: 9.3 mg/dL (ref 8.9–10.3)
Chloride: 105 mmol/L (ref 101–111)
Creatinine, Ser: 1.27 mg/dL — ABNORMAL HIGH (ref 0.61–1.24)
GFR calc Af Amer: 59 mL/min — ABNORMAL LOW (ref >60)
GFR calc non Af Amer: 50 mL/min — ABNORMAL LOW (ref >60)
Glucose, Bld: 113 mg/dL — ABNORMAL HIGH (ref 65–99)
Potassium: 4.4 mmol/L (ref 3.5–5.1)
Sodium: 138 mmol/L (ref 135–145)
Total Bilirubin: 0.8 mg/dL (ref 0.3–1.2)
Total Protein: 7.6 g/dL (ref 6.5–8.1)

## 2015-08-30 LAB — TROPONIN I
Troponin I: <0.03 ng/mL (ref <0.03)
Troponin I: <0.03 ng/mL (ref <0.03)

## 2015-08-30 LAB — D-DIMER, QUANTITATIVE: D-Dimer, Quant: 1.39 ug/mL-FEU — ABNORMAL HIGH (ref 0.00–0.50)

## 2015-08-30 LAB — CBC WITH DIFFERENTIAL/PLATELET
Basophils Absolute: 0 10*3/uL (ref 0.0–0.1)
Basophils Relative: 0 %
Eosinophils Absolute: 0.5 10*3/uL (ref 0.0–0.7)
Eosinophils Relative: 3 %
HCT: 45.4 % (ref 39.0–52.0)
Hemoglobin: 15.3 g/dL (ref 13.0–17.0)
Lymphocytes Relative: 11 %
Lymphs Abs: 1.4 10*3/uL (ref 0.7–4.0)
MCH: 31.2 pg (ref 26.0–34.0)
MCHC: 33.7 g/dL (ref 30.0–36.0)
MCV: 92.7 fL (ref 78.0–100.0)
Monocytes Absolute: 0.9 10*3/uL (ref 0.1–1.0)
Monocytes Relative: 7 %
Neutro Abs: 10.3 10*3/uL — ABNORMAL HIGH (ref 1.7–7.7)
Neutrophils Relative %: 79 %
Platelets: 209 10*3/uL (ref 150–400)
RBC: 4.9 MIL/uL (ref 4.22–5.81)
RDW: 12.8 % (ref 11.5–15.5)
WBC: 13.1 10*3/uL — ABNORMAL HIGH (ref 4.0–10.5)

## 2015-08-30 MED ORDER — ACETAMINOPHEN 500 MG PO TABS: 500.0000 mg | ORAL_TABLET | Freq: Four times a day (QID) | ORAL | Status: DC | PRN

## 2015-08-30 MED ORDER — LOSARTAN POTASSIUM 25 MG PO TABS
25.0000 mg | ORAL_TABLET | Freq: Every morning | ORAL | Status: DC
  Administered 2015-08-31: 25 mg via ORAL
  Filled 2015-08-30: qty 1

## 2015-08-30 MED ORDER — ONDANSETRON HCL 4 MG/2ML IJ SOLN: 4.0000 mg | Freq: Four times a day (QID) | INTRAMUSCULAR | Status: DC | PRN

## 2015-08-30 MED ORDER — PSYLLIUM 95 % PO PACK
1.0000 | PACK | Freq: Two times a day (BID) | ORAL | Status: DC
Start: 2015-08-30 — End: 2015-08-31
  Administered 2015-08-30 – 2015-08-31 (×2): 1 via ORAL
  Filled 2015-08-30 (×2): qty 1

## 2015-08-30 MED ORDER — ENOXAPARIN SODIUM 30 MG/0.3ML ~~LOC~~ SOLN
30.0000 mg | SUBCUTANEOUS | Status: DC
  Administered 2015-08-30: 30 mg via SUBCUTANEOUS
  Filled 2015-08-30: qty 0.3

## 2015-08-30 MED ORDER — IOPAMIDOL (ISOVUE-370) INJECTION 76%
100.0000 mL | Freq: Once | INTRAVENOUS | Status: AC | PRN
  Administered 2015-08-30: 100 mL via INTRAVENOUS

## 2015-08-30 MED ORDER — GATIFLOXACIN 0.5 % OP SOLN
1.0000 [drp] | Freq: Four times a day (QID) | OPHTHALMIC | Status: DC
  Administered 2015-08-30 – 2015-08-31 (×4): 1 [drp] via OPHTHALMIC

## 2015-08-30 MED ORDER — ASPIRIN EC 325 MG PO TBEC
325.0000 mg | DELAYED_RELEASE_TABLET | Freq: Every day | ORAL | Status: DC
  Administered 2015-08-31: 325 mg via ORAL
  Filled 2015-08-30: qty 1

## 2015-08-30 MED ORDER — PRAVASTATIN SODIUM 40 MG PO TABS
40.0000 mg | ORAL_TABLET | Freq: Every evening | ORAL | Status: DC
  Administered 2015-08-30: 40 mg via ORAL
  Filled 2015-08-30: qty 1

## 2015-08-30 MED ORDER — ACETAMINOPHEN 325 MG PO TABS: 650.0000 mg | ORAL_TABLET | ORAL | Status: DC | PRN

## 2015-08-30 MED ORDER — LOSARTAN POTASSIUM-HCTZ 100-25 MG PO TABS: 0.5000 | ORAL_TABLET | Freq: Every day | ORAL | Status: DC

## 2015-08-30 MED ORDER — TAMSULOSIN HCL 0.4 MG PO CAPS
0.4000 mg | ORAL_CAPSULE | Freq: Every day | ORAL | Status: DC
Start: 2015-08-30 — End: 2015-08-31
  Administered 2015-08-30 – 2015-08-31 (×2): 0.4 mg via ORAL
  Filled 2015-08-30 (×2): qty 1

## 2015-08-30 MED ORDER — PREDNISOLONE ACETATE 1 % OP SUSP
1.0000 [drp] | Freq: Four times a day (QID) | OPHTHALMIC | Status: DC
  Administered 2015-08-30 – 2015-08-31 (×4): 1 [drp] via OPHTHALMIC

## 2015-08-30 MED ORDER — MORPHINE SULFATE (PF) 2 MG/ML IV SOLN: 2.0000 mg | INTRAVENOUS | Status: DC | PRN

## 2015-08-30 MED ORDER — ASPIRIN 81 MG PO CHEW
162.0000 mg | CHEWABLE_TABLET | Freq: Once | ORAL | Status: AC
  Administered 2015-08-30: 162 mg via ORAL
  Filled 2015-08-30: qty 2

## 2015-08-30 NOTE — ED Notes (Signed)
Report given to RN for [phone].

## 2015-08-30 NOTE — ED Notes (Signed)
Attempt to call report x 2 to 3W. Staff unable to take report. Left name and number with Network engineer.

## 2015-08-30 NOTE — ED Notes (Signed)
Care turned over to [facility] transport in stable condition.

## 2015-08-30 NOTE — H&P (Signed)
History and Physical    Marc Gilbert [id number] DOB: 1930-06-24 DOA: 08/30/2015  PCP: Mathews Argyle, MD Consultants: Mar Daring - urology; Tonia Brooms - dermatology; Meta - GI; Prudencio Burly - ophthalmology Patient coming from: home, lives with wife  Chief Complaint: chest pain  HPI: Marc Gilbert is a 82 y.o. male with medical history significant of COPD, CKD, HTN, DM presenting with CP.  He reports that he awoke this Am about 0530 on left side, noticed substernal chest pain.  Rolled over with no relief.  Got up about 0630 and took medications, went to breakfast at 0730 with ongoing mild pain.  At 0830, pain was more severe, went to nurse at [facility] at 0900 and she suggested he come to ER.  Came to [facility].  Pain improved by 1200, had 2 additional ASA (plus 81 mg ASA this AM).  Pain 2-3/10 on arrival at ER, 1 on transfer.  Now with slight tightness in center of chest, heaviness.  ?SOB - he denies but has COPD and he thinks it is baseline but daughter and ER doctor were concerned about this.  Discomfort increases with deep inspiration.  No NTG, resolved with ASA and time.  CP does seem to get worse with exertion.    Last stress was treadmill in 1995, no h/o nuclear stress.  No h/o heart catheterization.   ED Course:  CP, positive D-dimer, negative CTPA, admitted for chest pain r/o  Review of Systems: As per HPI; otherwise 10 point review of systems reviewed and negative.   Ambulatory Status:  Ambulatory without assistance  Past Medical History  Diagnosis Date  . COPD (chronic obstructive pulmonary disease) (HCC)     no home O2  . GERD (gastroesophageal reflux disease)     occ  . H/O hiatal hernia   . Hypertension   . Dysplastic colon polyp age 6    carcinoma in situ  . Hyperlipidemia   . Arthritis     "right hand; back" (08/30/2015)  . Chronic lower back pain   . CKD (chronic kidney disease), stage III   . Cancer ([place])     skin  squamous and basal  . Prostate cancer ([place])  07/2014    active surveillance, [place] 6    Past Surgical History  Procedure Laterality Date  . Tonsillectomy    . Total hip arthroplasty  05/26/2011    Procedure: TOTAL HIP ARTHROPLASTY;  Surgeon: Garald Balding, MD;  Location: [facility];  Service: Orthopedics;  Laterality: Right;  . Cataract extraction w/ intraocular lens implant Left 08/27/15  . Mohs surgery      multiple SCC  . Joint replacement    . Prostate biopsy  07/2014    Social History   Social History  . Marital Status: Married    Spouse Name: N/A  . Number of Children: N/A  . Years of Education: N/A   Occupational History  . retired 1996    Social History Main Topics  . Smoking status: Former Smoker -- 0.50 packs/day for 25 years    Types: Cigarettes    Quit date: 05/24/1988  . Smokeless tobacco: Never Used  . Alcohol Use: 6.0 oz/week    5 Glasses of wine, 5 Cans of beer per week  . Drug Use: No  . Sexual Activity: Not on file   Other Topics Concern  . Not on file   Social History Narrative    No Known Allergies  Family History  Problem Relation Age of Onset  .  Diabetes Father   . Cancer Mother     breast    Prior to Admission medications   Medication Sig Start Date End Date Taking? Authorizing Provider  acetaminophen (TYLENOL) 500 MG tablet Take 500-1,000 mg by mouth every 6 (six) hours as needed for mild pain or moderate pain.   Yes Historical Provider, MD  aspirin 81 MG chewable tablet Chew 81 mg by mouth daily.    Yes Historical Provider, MD  gatifloxacin (ZYMAXID) 0.5 % SOLN Place 1 drop into the left eye See admin instructions. FOUR TIMES DAILY AS DIRECTED   Yes Historical Provider, MD  losartan (COZAAR) 25 MG tablet Take 25 mg by mouth every morning. 07/30/15  Yes Historical Provider, MD  Multiple Vitamins-Minerals (ONE-A-DAY MENS 50+ ADVANTAGE) TABS Take 1 tablet by mouth daily with breakfast.   Yes Historical Provider, MD  Omega-3 Fatty Acids (FISH OIL) 600 MG CAPS Take 1,800 mg by mouth  every morning.    Yes Historical Provider, MD  pravastatin (PRAVACHOL) 40 MG tablet Take 40 mg by mouth every evening.    Yes Historical Provider, MD  prednisoLONE acetate (PRED FORTE) 1 % ophthalmic suspension Place 1 drop into the left eye 4 (four) times daily. AFTER SURGERY AS DIRECTED   Yes Historical Provider, MD  psyllium (METAMUCIL) 58.6 % packet Take 1 packet by mouth 2 (two) times daily.   Yes Historical Provider, MD  Tamsulosin HCl (FLOMAX) 0.4 MG CAPS Take 0.4 mg by mouth daily.   Yes Historical Provider, MD  losartan-hydrochlorothiazide (HYZAAR) 100-25 MG per tablet Take 0.5 tablets by mouth daily. Reported on 08/30/2015    Historical Provider, MD    Physical Exam: Filed Vitals:   08/30/15 1130 08/30/15 1230 08/30/15 1330 08/30/15 1523  BP: 136/78 142/83 133/82 151/83  Pulse: 76 82 76 87  Temp:    97.5 F (36.4 C)  TempSrc:    Oral  Resp: 18 25 19    Height:    5' 11.5" (1.816 m)  Weight:    90.992 kg (200 lb 9.6 oz)  SpO2: 98% 98% 97% 99%     General: Appears calm and comfortable and is NAD Eyes:  EOMI, normal lids, iris ENT:  grossly normal hearing, lips & tongue, mmm Neck:  no LAD, masses or thyromegaly Cardiovascular:  RRR, no m/r/g. No LE edema.  Respiratory: CTA bilaterally, no w/r/r. Normal respiratory effort.  Mild LLL crackles which are likely atelectasis Abdomen:  soft, ntnd, NABS Skin:  no rash or induration seen on limited exam Musculoskeletal:  grossly normal tone BUE/BLE, good ROM, no bony abnormality Psychiatric:  grossly normal mood and affect, speech fluent and appropriate, AOx3 Neurologic:  CN 2-12 grossly intact, moves all extremities in coordinated fashion, sensation intact  Labs on Admission: I have personally reviewed following labs and imaging studies  CBC:  Recent Labs Lab 08/30/15 0930  WBC 13.1*  NEUTROABS 10.3*  HGB 15.3  HCT 45.4  MCV 92.7  PLT [id number]   Basic Metabolic Panel:  Recent Labs Lab 08/30/15 0930  NA 138  K 4.4  CL  105  CO2 27  GLUCOSE 113*  BUN 24*  CREATININE 1.27*  CALCIUM 9.3   GFR: Estimated Creatinine Clearance: 47.7 mL/min (by C-G formula based on Cr of 1.27). Liver Function Tests:  Recent Labs Lab 08/30/15 0930  AST 20  ALT 20  ALKPHOS 44  BILITOT 0.8  PROT 7.6  ALBUMIN 4.3   No results for input(s): LIPASE, AMYLASE in the last 168 hours.  No results for input(s): AMMONIA in the last 168 hours. Coagulation Profile: No results for input(s): INR, PROTIME in the last 168 hours. Cardiac Enzymes:  Recent Labs Lab 08/30/15 0930  TROPONINI <0.03   BNP (last 3 results) No results for input(s): PROBNP in the last 8760 hours. HbA1C: No results for input(s): HGBA1C in the last 72 hours. CBG: No results for input(s): GLUCAP in the last 168 hours. Lipid Profile: No results for input(s): CHOL, HDL, LDLCALC, TRIG, CHOLHDL, LDLDIRECT in the last 72 hours. Thyroid Function Tests: No results for input(s): TSH, T4TOTAL, FREET4, T3FREE, THYROIDAB in the last 72 hours. Anemia Panel: No results for input(s): VITAMINB12, FOLATE, FERRITIN, TIBC, IRON, RETICCTPCT in the last 72 hours. Urine analysis:    Component Value Date/Time   COLORURINE YELLOW 05/25/2011 0935   APPEARANCEUR CLEAR 05/25/2011 0935   LABSPEC 1.021 05/25/2011 0935   PHURINE 5.5 05/25/2011 0935   GLUCOSEU NEGATIVE 05/25/2011 0935   HGBUR NEGATIVE 05/25/2011 0935   BILIRUBINUR NEGATIVE 05/25/2011 0935   KETONESUR NEGATIVE 05/25/2011 0935   PROTEINUR NEGATIVE 05/25/2011 0935   UROBILINOGEN 0.2 05/25/2011 0935   NITRITE NEGATIVE 05/25/2011 0935   LEUKOCYTESUR NEGATIVE 05/25/2011 0935    Creatinine Clearance: Estimated Creatinine Clearance: 47.7 mL/min (by C-G formula based on Cr of 1.27).  Sepsis Labs: @LABRCNTIP (procalcitonin:4,lacticidven:4) )No results found for this or any previous visit (from the past 240 hour(s)).   Radiological Exams on Admission: Dg Chest 2 View  08/30/2015  CLINICAL DATA:  Chest pain  since this morning. EXAM: CHEST  2 VIEW COMPARISON:  06/16/2013 FINDINGS: Linear scarring in the left base. Right lung is clear. Heart is normal size. No effusions or acute bony abnormality. IMPRESSION: Left basilar scarring.  No active disease. Electronically Signed   By: Rolm Baptise M.D.   On: 08/30/2015 10:10   Ct Angio Chest Pe W/cm &/or Wo Cm  08/30/2015  CLINICAL DATA:  Chest pain, elevated D-dimer EXAM: CT ANGIOGRAPHY CHEST WITH CONTRAST TECHNIQUE: Multidetector CT imaging of the chest was performed using the standard protocol during bolus administration of intravenous contrast. Multiplanar CT image reconstructions and MIPs were obtained to evaluate the vascular anatomy. CONTRAST:  100 cc Isovue COMPARISON:  None. FINDINGS: Cardiovascular: No aortic aneurysm. Mild atherosclerotic calcifications of thoracic aorta. The study is of excellent technical quality. No pulmonary embolus is noted. Atherosclerotic calcifications of coronary arteries. Mediastinum/Nodes: No mediastinal hematoma or adenopathy. No hilar adenopathy. Lungs/Pleura: Images of the lung parenchyma shows no acute infiltrate or pleural effusion. No pulmonary edema. Mild atelectasis noted bilateral lower lobe posteriorly. Upper Abdomen: The visualized upper abdomen shows no adrenal gland mass. Visualized liver, distal pancreas and spleen is unremarkable. Musculoskeletal: No destructive bony lesions are noted. Sagittal images of the spine shows degenerative changes thoracic spine. Sagittal view of the sternum is unremarkable. Review of the MIP images confirms the above findings. IMPRESSION: 1. No pulmonary embolus is noted. 2. No infiltrate or pulmonary edema. 3. No mediastinal hematoma or adenopathy. 4. Degenerative changes thoracic spine. 5. Atherosclerotic calcifications of thoracic aorta and coronary arteries. Electronically Signed   By: Lahoma Crocker M.D.   On: 08/30/2015 11:53    EKG: Independently reviewed.  NSR, nonspecific ST changes  with no evidence of acute ischemia  Assessment/Plan Principal Problem:   Chest pain Active Problems:   Hypertension   COPD (chronic obstructive pulmonary disease) (HCC)   CKD (chronic kidney disease) stage 3, GFR 30-59 ml/min   Hyperlipidemia   Chest pain -Patient with nonspecific symptoms/1-2/3 typical symptoms  suggestive of atypical chest pain. CXR unremarkable.  Initial cardiac enzymes negative.  EKG negative or with nonspecific ST changes not indicative of acute ischemia.   -Heart score is 5 -Will admit to telemetry on observation status -Initial CE at 0930, repeat was recently drawn and is pending; if negative, he is essentially already ruled out but will obtain 3rd set as well -Risk factor stratification with FLP and HgbA1c -MPS in AM -Plan for discharge to home tomorrow after stress testing if tests are negative for ischemia -Cardiology consultation in AM (attempted to call but there really is no reason why the patient needs to be seen tonight with negative EKG and CE so will not re-page)  HTN -Slightly above usual goal but with patient's age, SBP goal is < 160.   -If CAD, will need tighter control -For now, continue home medications without further intervention  CKD -Creatinine actually better than usual 1.3 -Will follow  HLD -Check FLP -Continue Pravachol  COPD -Mild, but likely associated with SOB today -No evidence of exacerbation -Will follow   DVT prophylaxis: Lovenox  Code Status: Full - confirmed with patient/family Family Communication: Wife and daughter Margaretha Glassing, MD, GYN in this community) present at the bedside and in agreement with plan Disposition Plan: Home once clinically improved Consults called: Cardiology (in AM) Admission status: Observation   Karmen Bongo MD [facility]  If 7PM-7AM, please contact night-coverage [email or web address] Password [facility]  08/30/2015, 5:32 PM

## 2015-08-30 NOTE — ED Notes (Signed)
States he awoke this am with chest pain at 0530. Mid chest pain, described as tight. Hurts worse with deep breaths.

## 2015-08-30 NOTE — ED Notes (Signed)
[facility] here to transfer pt to [facility].

## 2015-08-30 NOTE — ED Notes (Signed)
Report given to [facility]  EMT-P with [facility].

## 2015-08-30 NOTE — ED Provider Notes (Signed)
CSN: [id number]     Arrival date & time 08/30/15  [id number] History   First MD Initiated Contact with Patient 08/30/15 0933     No chief complaint on file.    (Consider location/radiation/quality/duration/timing/severity/associated sxs/prior Treatment) HPI   The very pleasant 82 year old male with history of COPD and chronic kidney disease presenting today with chest pain. Patient had chest pain that awoke him from sleep at 5:30 this morning. He felt was a heaviness in the center of his chest. Did not radiate anywhere. Patient took a baby aspirin and his morning meds. He then began to feel worse. He had a occasional shortness of breath. He said of the chest heaviness got worse as he was walking to the nurse's office.  Patient has no history of cardiac issues. Does have hypertension hyperlipidemia.  Past Medical History  Diagnosis Date  . COPD (chronic obstructive pulmonary disease) ([place])     told  had copd  . Chronic kidney disease     insuffiency  no change last 3 yrs  . GERD (gastroesophageal reflux disease)     occ  . H/O hiatal hernia   . Cancer ([place])     skin  squamous and basal  . Arthritis    Past Surgical History  Procedure Laterality Date  . Tonsillectomy    . Total hip arthroplasty  05/26/2011    Procedure: TOTAL HIP ARTHROPLASTY;  Surgeon: Garald Balding, MD;  Location: [place];  Service: Orthopedics;  Laterality: Right;   No family history on file. Social History  Substance Use Topics  . Smoking status: Former Smoker -- 0.50 packs/day for 25 years    Quit date: 05/24/1988  . Smokeless tobacco: None  . Alcohol Use: 6.0 oz/week    5 Glasses of wine, 5 Cans of beer per week    Review of Systems  Constitutional: Negative for fever and activity change.  HENT: Negative for congestion.   Eyes: Negative for pain and visual disturbance.  Respiratory: Positive for shortness of breath. Negative for cough.   Cardiovascular: Positive for chest pain.  Gastrointestinal:  Negative for nausea, vomiting, abdominal pain and diarrhea.  Genitourinary: Negative for dysuria and frequency.  Musculoskeletal: Negative for back pain and gait problem.  Skin: Negative for rash.  Allergic/Immunologic: Negative for immunocompromised state.  Neurological: Negative for dizziness and light-headedness.  Psychiatric/Behavioral: Negative for behavioral problems and agitation.  All other systems reviewed and are negative.     Allergies  Review of patient's allergies indicates no known allergies.  Home Medications   Prior to Admission medications   Medication Sig Start Date End Date Taking? Authorizing Provider  aspirin 81 MG chewable tablet Chew by mouth daily.   Yes Historical Provider, MD  losartan-hydrochlorothiazide (HYZAAR) 100-25 MG per tablet Take 0.5 tablets by mouth daily.   Yes Historical Provider, MD  Pediatric Multiple Vitamins (MULTIVITAMIN) LIQD Take 5 mLs by mouth daily.   Yes Historical Provider, MD  pravastatin (PRAVACHOL) 40 MG tablet Take 40 mg by mouth daily.   Yes Historical Provider, MD  Tamsulosin HCl (FLOMAX) 0.4 MG CAPS Take 0.4 mg by mouth daily.   Yes Historical Provider, MD   There were no vitals taken for this visit. Physical Exam  Constitutional: He is oriented to person, place, and time. He appears well-nourished.  HENT:  Head: Normocephalic.  Mouth/Throat: Oropharynx is clear and moist.  Eyes: Conjunctivae are normal.  Neck: No tracheal deviation present.  Cardiovascular: Normal rate.   Pulmonary/Chest: Effort normal. No stridor.  No respiratory distress.  Abdominal: Soft. There is no tenderness. There is no guarding.  Musculoskeletal: Normal range of motion. He exhibits no edema.  Neurological: He is oriented to person, place, and time. No cranial nerve deficit.  Skin: Skin is warm and dry. No rash noted. He is not diaphoretic.  Psychiatric: He has a normal mood and affect. His behavior is normal.  Nursing note and vitals  reviewed.   ED Course  Procedures (including critical care time) Labs Review Labs Reviewed  COMPREHENSIVE METABOLIC PANEL  CBC WITH DIFFERENTIAL/PLATELET  TROPONIN I  D-DIMER, QUANTITATIVE (NOT AT [facility])    Imaging Review No results found. I have personally reviewed and evaluated these images and lab results as part of my medical decision-making.   EKG Interpretation   Date/Time:  Friday August 30 2015 09:30:55 EDT Ventricular Rate:  84 PR Interval:    QRS Duration: 134 QT Interval:  389 QTC Calculation: 460 R Axis:   -42 Text Interpretation:  Sinus rhythm Nonspecific IVCD with LAD Baseline  wander in lead(s) I II aVR aVL Non-specific intra-ventricular conduction  delay Confirmed by Gerald Leitz ([id number]) on 08/30/2015 9:47:03 AM      MDM   Final diagnoses:  None  Very pleasant elderly 82 year old male presenting with chest pain. Patient hasbeen cardiac history. He has had hypertension hyperlipidemia. Story very concerning for cardiac etiology. Patient also said shortness of breath and pain with deep breaths so could consider pulmonary embolism. We'll get d-dimer and troponin x-ray labs.  12:40 PM PE study negative. Given patient's intermittent chest pain, and mostly his age, HTN and HLD, he will need admission for chest pain rule out.  Disuccsed with his daughter, an Materials engineer.     Latarra Eagleton Julio Alm, MD 08/30/15 1241

## 2015-08-31 ENCOUNTER — Observation Stay (HOSPITAL_BASED_OUTPATIENT_CLINIC_OR_DEPARTMENT_OTHER): Payer: Medicare Other

## 2015-08-31 ENCOUNTER — Observation Stay (HOSPITAL_COMMUNITY): Payer: Medicare Other

## 2015-08-31 DIAGNOSIS — R079 Chest pain, unspecified: Secondary | ICD-10-CM | POA: Insufficient documentation

## 2015-08-31 DIAGNOSIS — Z7982 Long term (current) use of aspirin: Secondary | ICD-10-CM | POA: Diagnosis not present

## 2015-08-31 DIAGNOSIS — R0789 Other chest pain: Secondary | ICD-10-CM | POA: Diagnosis not present

## 2015-08-31 DIAGNOSIS — J449 Chronic obstructive pulmonary disease, unspecified: Secondary | ICD-10-CM | POA: Diagnosis not present

## 2015-08-31 DIAGNOSIS — N183 Chronic kidney disease, stage 3 (moderate): Secondary | ICD-10-CM | POA: Diagnosis not present

## 2015-08-31 DIAGNOSIS — E785 Hyperlipidemia, unspecified: Secondary | ICD-10-CM | POA: Diagnosis not present

## 2015-08-31 DIAGNOSIS — I129 Hypertensive chronic kidney disease with stage 1 through stage 4 chronic kidney disease, or unspecified chronic kidney disease: Secondary | ICD-10-CM | POA: Diagnosis not present

## 2015-08-31 DIAGNOSIS — Z87891 Personal history of nicotine dependence: Secondary | ICD-10-CM | POA: Diagnosis not present

## 2015-08-31 DIAGNOSIS — Z79899 Other long term (current) drug therapy: Secondary | ICD-10-CM | POA: Diagnosis not present

## 2015-08-31 DIAGNOSIS — R0602 Shortness of breath: Secondary | ICD-10-CM | POA: Diagnosis not present

## 2015-08-31 LAB — ECHOCARDIOGRAM COMPLETE
Height: 71.5 in
Weight: 3201.6 oz

## 2015-08-31 LAB — NM MYOCAR MULTI W/SPECT W/WALL MOTION / EF
Estimated workload: 1 METS
Exercise duration (min): 7 min
Exercise duration (sec): 16 s
Peak HR: 99 {beats}/min
Rest HR: 74 {beats}/min

## 2015-08-31 LAB — TROPONIN I
Troponin I: <0.03 ng/mL (ref <0.03)
Troponin I: <0.03 ng/mL (ref <0.03)

## 2015-08-31 MED ORDER — TECHNETIUM TC 99M TETROFOSMIN IV KIT
30.0000 | PACK | Freq: Once | INTRAVENOUS | Status: AC | PRN
  Administered 2015-08-31: 30 via INTRAVENOUS

## 2015-08-31 MED ORDER — REGADENOSON 0.4 MG/5ML IV SOLN
INTRAVENOUS | Status: AC
  Filled 2015-08-31: qty 5

## 2015-08-31 MED ORDER — REGADENOSON 0.4 MG/5ML IV SOLN
0.4000 mg | Freq: Once | INTRAVENOUS | Status: AC
  Administered 2015-08-31: 0.4 mg via INTRAVENOUS

## 2015-08-31 MED ORDER — TECHNETIUM TC 99M TETROFOSMIN IV KIT
10.0000 | PACK | Freq: Once | INTRAVENOUS | Status: AC | PRN
  Administered 2015-08-31: 10 via INTRAVENOUS

## 2015-08-31 NOTE — Discharge Instructions (Signed)
Heart-Healthy Eating Plan °Many factors influence your heart health, including eating and exercise habits. Heart (coronary) risk increases with abnormal blood fat (lipid) levels. Heart-healthy meal planning includes limiting unhealthy fats, increasing healthy fats, and making other small dietary changes. This includes maintaining a healthy body weight to help keep lipid levels within a normal range. °WHAT IS MY PLAN?  °Your health care provider recommends that you: °· Get no more than _________% of the total calories in your daily diet from fat. °· Limit your intake of saturated fat to less than _________% of your total calories each day. °· Limit the amount of cholesterol in your diet to less than _________ mg per day. °WHAT TYPES OF FAT SHOULD I CHOOSE? °· Choose healthy fats more often. Choose monounsaturated and polyunsaturated fats, such as olive oil and canola oil, flaxseeds, walnuts, almonds, and seeds. °· Eat more omega-3 fats. Good choices include salmon, mackerel, sardines, tuna, flaxseed oil, and ground flaxseeds. Aim to eat fish at least two times each week. °· Limit saturated fats. Saturated fats are primarily found in animal products, such as meats, butter, and cream. Plant sources of saturated fats include palm oil, palm kernel oil, and coconut oil. °· Avoid foods with partially hydrogenated oils in them. These contain trans fats. Examples of foods that contain trans fats are stick margarine, some tub margarines, cookies, crackers, and other baked goods. °WHAT GENERAL GUIDELINES DO I NEED TO FOLLOW? °· Check food labels carefully to identify foods with trans fats or high amounts of saturated fat. °· Fill one half of your plate with vegetables and green salads. Eat 4-5 servings of vegetables per day. A serving of vegetables equals 1 cup of raw leafy vegetables, ½ cup of raw or cooked cut-up vegetables, or ½ cup of vegetable juice. °· Fill one fourth of your plate with whole grains. Look for the word  "whole" as the first word in the ingredient list. °· Fill one fourth of your plate with lean protein foods. °· Eat 4-5 servings of fruit per day. A serving of fruit equals one medium whole fruit, ¼ cup of dried fruit, ½ cup of fresh, frozen, or canned fruit, or ½ cup of 100% fruit juice. °· Eat more foods that contain soluble fiber. Examples of foods that contain this type of fiber are apples, broccoli, carrots, beans, peas, and barley. Aim to get 20-30 g of fiber per day. °· Eat more home-cooked food and less restaurant, buffet, and fast food. °· Limit or avoid alcohol. °· Limit foods that are high in starch and sugar. °· Avoid fried foods. °· Cook foods by using methods other than frying. Baking, boiling, grilling, and broiling are all great options. Other fat-reducing suggestions include: °¨ Removing the skin from poultry. °¨ Removing all visible fats from meats. °¨ Skimming the fat off of stews, soups, and gravies before serving them. °¨ Steaming vegetables in water or broth. °· Lose weight if you are overweight. Losing just 5-10% of your initial body weight can help your overall health and prevent diseases such as diabetes and heart disease. °· Increase your consumption of nuts, legumes, and seeds to 4-5 servings per week. One serving of dried beans or legumes equals ½ cup after being cooked, one serving of nuts equals 1½ ounces, and one serving of seeds equals ½ ounce or 1 tablespoon. °· You may need to monitor your salt (sodium) intake, especially if you have high blood pressure. Talk with your health care provider or dietitian to get   more information about reducing sodium. °WHAT FOODS CAN I EAT? °Grains °Breads, including French, white, pita, wheat, raisin, rye, oatmeal, and Italian. Tortillas that are neither fried nor made with lard or trans fat. Low-fat rolls, including hotdog and hamburger buns and English muffins. Biscuits. Muffins. Waffles. Pancakes. Light popcorn. Whole-grain cereals. Flatbread. Melba  toast. Pretzels. Breadsticks. Rusks. Low-fat snacks and crackers, including oyster, saltine, matzo, graham, animal, and rye. Rice and pasta, including brown rice and those that are made with whole wheat. °Vegetables °All vegetables. °Fruits °All fruits, but limit coconut. °Meats and Other Protein Sources °Lean, well-trimmed beef, veal, pork, and lamb. Chicken and turkey without skin. All fish and shellfish. Wild duck, rabbit, pheasant, and venison. Egg whites or low-cholesterol egg substitutes. Dried beans, peas, lentils, and tofu. Seeds and most nuts. °Dairy °Low-fat or nonfat cheeses, including ricotta, string, and mozzarella. Skim or 1% milk that is liquid, powdered, or evaporated. Buttermilk that is made with low-fat milk. Nonfat or low-fat yogurt. °Beverages °Mineral water. Diet carbonated beverages. °Sweets and Desserts °Sherbets and fruit ices. Honey, jam, marmalade, jelly, and syrups. Meringues and gelatins. Pure sugar candy, such as hard candy, jelly beans, gumdrops, mints, marshmallows, and small amounts of dark chocolate. Angel food cake. °Eat all sweets and desserts in moderation. °Fats and Oils °Nonhydrogenated (trans-free) margarines. Vegetable oils, including soybean, sesame, sunflower, olive, peanut, safflower, corn, canola, and cottonseed. Salad dressings or mayonnaise that are made with a vegetable oil. Limit added fats and oils that you use for cooking, baking, salads, and as spreads. °Other °Cocoa powder. Coffee and tea. All seasonings and condiments. °The items listed above may not be a complete list of recommended foods or beverages. Contact your dietitian for more options. °WHAT FOODS ARE NOT RECOMMENDED? °Grains °Breads that are made with saturated or trans fats, oils, or whole milk. Croissants. Butter rolls. Cheese breads. Sweet rolls. Donuts. Buttered popcorn. Chow mein noodles. High-fat crackers, such as cheese or butter crackers. °Meats and Other Protein Sources °Fatty meats, such as  hotdogs, short ribs, sausage, spareribs, bacon, ribeye roast or steak, and mutton. High-fat deli meats, such as salami and bologna. Caviar. Domestic duck and goose. Organ meats, such as kidney, liver, sweetbreads, brains, gizzard, chitterlings, and heart. °Dairy °Cream, sour cream, cream cheese, and creamed cottage cheese. Whole milk cheeses, including blue (bleu), Monterey Jack, Brie, Colby, American, Havarti, Swiss, cheddar, Camembert, and [place].  Whole or 2% milk that is liquid, evaporated, or condensed. Whole buttermilk. Cream sauce or high-fat cheese sauce. Yogurt that is made from whole milk. °Beverages °Regular sodas and drinks with added sugar. °Sweets and Desserts °Frosting. Pudding. Cookies. Cakes other than angel food cake. Candy that has milk chocolate or white chocolate, hydrogenated fat, butter, coconut, or unknown ingredients. Buttered syrups. Full-fat ice cream or ice cream drinks. °Fats and Oils °Gravy that has suet, meat fat, or shortening. Cocoa butter, hydrogenated oils, palm oil, coconut oil, palm kernel oil. These can often be found in baked products, candy, fried foods, nondairy creamers, and whipped toppings. Solid fats and shortenings, including bacon fat, salt pork, lard, and butter. Nondairy cream substitutes, such as coffee creamers and sour cream substitutes. Salad dressings that are made of unknown oils, cheese, or sour cream. °The items listed above may not be a complete list of foods and beverages to avoid. Contact your dietitian for more information. °  °This information is not intended to replace advice given to you by your health care provider. Make sure you discuss any questions you have with your health   care provider. °  °Document Released: 11/12/2007 Document Revised: 02/23/2014 Document Reviewed: 07/27/2013 °Elsevier Interactive Patient Education ©2016 [employer] Inc. ° °

## 2015-08-31 NOTE — Progress Notes (Signed)
Patient presented for Lexiscan. Tolerated procedure well. Result to follow.  

## 2015-08-31 NOTE — Discharge Summary (Signed)
Physician Discharge Summary  Marc Gilbert [id number] DOB: 22-Jul-1930 DOA: 08/30/2015  PCP: Mathews Argyle, MD  Admit date: 08/30/2015 Discharge date: 08/31/2015  Time spent: 45 minutes  Recommendations for Outpatient Follow-up:  1. PCP Dr.Stoneking in 1week   Discharge Diagnoses:  Principal Problem:   Chest pain Active Problems:   Hypertension   COPD (chronic obstructive pulmonary disease) ([facility])   CKD (chronic kidney disease) stage 3, GFR 30-59 ml/min   Hyperlipidemia   Pain in the chest   Discharge Condition: stable  Diet recommendation: heart healthy  Filed Weights   08/30/15 1523 08/31/15 0410  Weight: 90.992 kg (200 lb 9.6 oz) 90.765 kg (200 lb 1.6 oz)    History of present illness:  Marc Gilbert is a 82 y.o. male with medical history significant of COPD, CKD, HTN, DM presenting with CP. He reports that he awoke this Am about 0530 on left side, noticed substernal chest pain. Rolled over with no relief. Got up about 0630 and took medications, went to breakfast at 0730 with ongoing mild pain. At 0830, pain was more severe, went to nurse at [facility] at 0900 and she suggested he come to ER. Came to [facility] Pain improved by 1200, had 2 additional ASA (plus 81 mg ASA this AM). Pain 2-3/10 on arrival at ER, 1 on transfer. Now with slight tightness in center of chest, heaviness. ?SOB - he denies but has COPD and he thinks it is baseline but daughter and ER doctor were concerned about this. Discomfort increases with deep inspiration. No NTG, resolved with ASA   Hospital Course:  Chest pain -Patient admitted with chest pain with some typical and some atypical symptoms -CXR unremarkable. EKG negative or with nonspecific ST changes not indicative of acute ischemia.  -ruled out for ACS, troponin was negative, CTA chest was negative for PE. -Discussed with Dr.Tilley cardiology and Stress test was completed today which showed No evidence of reversible ischemia, but  there was question of mild Infero-lateral hypokinesis. I called and discussed this with Dr.TIlley who recommended an ECHO to better evaluate wall motion. This echo was just completed, it showed EF around 45-50% and normal wall motion without any regional wall motion abnormalities and hence he was discharged home in a stable condition  HTN -stable, continued on home regimen  CKD -Creatinine actually better than usual 1.3  HLD -Continue Pravachol  COPD -Mild, but likely associated with SOB today  Procedures:  Myoview: no reversible ischemia  ECHO: Study Conclusions - Left ventricle: The cavity size was normal. There was mild  concentric hypertrophy. Systolic function was mildly reduced. The  estimated ejection fraction was in the range of 45% to 50%. Wall  motion was normal; there were no regional wall motion  abnormalities. Doppler parameters are consistent with abnormal  left ventricular relaxation (grade 1 diastolic dysfunction). - Aortic root: The aortic root was trivially dilated. - Right ventricle: The cavity size was mildly dilated. Wall  thickness was normal.  - Right atrium: The atrium was mildly dilated.  Consultations:  D/w [facility] multiple times  Discharge Exam: Filed Vitals:   08/31/15 0924 08/31/15 1136  BP: 141/63 135/79  Pulse: 94 78  Temp:  98.4 F (36.9 C)  Resp:  18    General: AAOx3 Cardiovascular: S1S2/RRR Respiratory: CTAB  Discharge Instructions   Discharge Instructions    Diet - low sodium heart healthy    Complete by:  As directed      Diet - low sodium heart healthy  Complete by:  As directed      Increase activity slowly    Complete by:  As directed      Increase activity slowly    Complete by:  As directed           Current Discharge Medication List    CONTINUE these medications which have NOT CHANGED   Details  acetaminophen (TYLENOL) 500 MG tablet Take 500-1,000 mg by mouth every 6 (six) hours as  needed for mild pain or moderate pain.    aspirin 81 MG chewable tablet Chew 81 mg by mouth daily.     gatifloxacin (ZYMAXID) 0.5 % SOLN Place 1 drop into the left eye See admin instructions. FOUR TIMES DAILY AS DIRECTED    losartan (COZAAR) 25 MG tablet Take 25 mg by mouth every morning.    Multiple Vitamins-Minerals (ONE-A-DAY MENS 50+ ADVANTAGE) TABS Take 1 tablet by mouth daily with breakfast.    Omega-3 Fatty Acids (FISH OIL) 600 MG CAPS Take 1,800 mg by mouth every morning.     pravastatin (PRAVACHOL) 40 MG tablet Take 40 mg by mouth every evening.     prednisoLONE acetate (PRED FORTE) 1 % ophthalmic suspension Place 1 drop into the left eye 4 (four) times daily. AFTER SURGERY AS DIRECTED    psyllium (METAMUCIL) 58.6 % packet Take 1 packet by mouth 2 (two) times daily.    Tamsulosin HCl (FLOMAX) 0.4 MG CAPS Take 0.4 mg by mouth daily.    losartan-hydrochlorothiazide (HYZAAR) 100-25 MG per tablet Take 0.5 tablets by mouth daily. Reported on 08/30/2015       No Known Allergies Follow-up Information    Follow up with Mathews Argyle, MD. Schedule an appointment as soon as possible for a visit in 1 week.   Specialty:  Internal Medicine   Contact information:   [address] [place] [address] [phone]        The results of significant diagnostics from this hospitalization (including imaging, microbiology, ancillary and laboratory) are listed below for reference.    Significant Diagnostic Studies: Dg Chest 2 View  08/30/2015  CLINICAL DATA:  Chest pain since this morning. EXAM: CHEST  2 VIEW COMPARISON:  06/16/2013 FINDINGS: Linear scarring in the left base. Right lung is clear. Heart is normal size. No effusions or acute bony abnormality. IMPRESSION: Left basilar scarring.  No active disease. Electronically Signed   By: Rolm Baptise M.D.   On: 08/30/2015 10:10   Ct Angio Chest Pe W/cm &/or Wo Cm  08/30/2015  CLINICAL DATA:  Chest pain, elevated  D-dimer EXAM: CT ANGIOGRAPHY CHEST WITH CONTRAST TECHNIQUE: Multidetector CT imaging of the chest was performed using the standard protocol during bolus administration of intravenous contrast. Multiplanar CT image reconstructions and MIPs were obtained to evaluate the vascular anatomy. CONTRAST:  100 cc Isovue COMPARISON:  None. FINDINGS: Cardiovascular: No aortic aneurysm. Mild atherosclerotic calcifications of thoracic aorta. The study is of excellent technical quality. No pulmonary embolus is noted. Atherosclerotic calcifications of coronary arteries. Mediastinum/Nodes: No mediastinal hematoma or adenopathy. No hilar adenopathy. Lungs/Pleura: Images of the lung parenchyma shows no acute infiltrate or pleural effusion. No pulmonary edema. Mild atelectasis noted bilateral lower lobe posteriorly. Upper Abdomen: The visualized upper abdomen shows no adrenal gland mass. Visualized liver, distal pancreas and spleen is unremarkable. Musculoskeletal: No destructive bony lesions are noted. Sagittal images of the spine shows degenerative changes thoracic spine. Sagittal view of the sternum is unremarkable. Review of the MIP images confirms the  above findings. IMPRESSION: 1. No pulmonary embolus is noted. 2. No infiltrate or pulmonary edema. 3. No mediastinal hematoma or adenopathy. 4. Degenerative changes thoracic spine. 5. Atherosclerotic calcifications of thoracic aorta and coronary arteries. Electronically Signed   By: Lahoma Crocker M.D.   On: 08/30/2015 11:53   Nm Myocar Multi W/spect W/wall Motion / Ef  08/31/2015  CLINICAL DATA:  Chest pain. EXAM: MYOCARDIAL IMAGING WITH SPECT (REST AND PHARMACOLOGIC-STRESS) GATED LEFT VENTRICULAR WALL MOTION STUDY LEFT VENTRICULAR EJECTION FRACTION TECHNIQUE: Standard myocardial SPECT imaging was performed after resting intravenous injection of 10 mCi Tc-73m tetrofosmin. Subsequently, intravenous infusion of Lexiscan was performed under the supervision of the Cardiology staff. At  peak effect of the drug, 30 mCi Tc-85m tetrofosmin was injected intravenously and standard myocardial SPECT imaging was performed. Quantitative gated imaging was also performed to evaluate left ventricular wall motion, and estimate left ventricular ejection fraction. COMPARISON:  None. FINDINGS: Perfusion: There is a medium to large size area of mild decreased activity defect involving the inferior apex and inferior wall. No reversibility identified. Wall Motion: Decreased wall motion involving the mid and distal segments of the inferior wall. There is moderate hypokinesis involving the proximal mid and distal segments of the lateral wall. Left Ventricular Ejection Fraction: 47 % End diastolic volume 79 ml End systolic volume 42 ml IMPRESSION: 1. No pharmacologically induced. Myocardial reversibility identified. 2. Moderate lateral wall hypokinesis and mild inferior wall hypokinesis. 3. Left ventricular ejection fraction 47% 4. Non invasive risk stratification*: Low *2012 Appropriate Use Criteria for Coronary Revascularization Focused Update: J Am Coll Cardiol. [id number]. http://content.airportbarriers.com.aspx?articleid=[email or web address] Electronically Signed   By: Kerby Moors M.D.   On: 08/31/2015 11:46    Microbiology: No results found for this or any previous visit (from the past 240 hour(s)).   Labs: Basic Metabolic Panel:  Recent Labs Lab 08/30/15 0930  NA 138  K 4.4  CL 105  CO2 27  GLUCOSE 113*  BUN 24*  CREATININE 1.27*  CALCIUM 9.3   Liver Function Tests:  Recent Labs Lab 08/30/15 0930  AST 20  ALT 20  ALKPHOS 44  BILITOT 0.8  PROT 7.6  ALBUMIN 4.3   No results for input(s): LIPASE, AMYLASE in the last 168 hours. No results for input(s): AMMONIA in the last 168 hours. CBC:  Recent Labs Lab 08/30/15 0930  WBC 13.1*  NEUTROABS 10.3*  HGB 15.3  HCT 45.4  MCV 92.7  PLT 209   Cardiac Enzymes:  Recent Labs Lab 08/30/15 0930 08/30/15 1621 08/30/15 2241  08/31/15 0425  TROPONINI <0.03 <0.03 <0.03 <0.03   BNP: BNP (last 3 results) No results for input(s): BNP in the last 8760 hours.  ProBNP (last 3 results) No results for input(s): PROBNP in the last 8760 hours.  CBG: No results for input(s): GLUCAP in the last 168 hours.     SignedDomenic Polite MD.  [facility] 08/31/2015, 4:30 PM

## 2015-08-31 NOTE — Progress Notes (Signed)
Echocardiogram 2D Echocardiogram has been performed.  Marc Gilbert 08/31/2015, 3:41 PM

## 2015-08-31 NOTE — Progress Notes (Signed)
Myoview showed   1. No pharmacologically induced. Myocardial reversibility identified.  2. Moderate lateral wall hypokinesis and mild inferior wall hypokinesis.  3. Left ventricular ejection fraction 47%  4. Non invasive risk stratification*: Low   Low risk test with moderate hypokinesis. Pending echo to assess further.

## 2015-09-04 DIAGNOSIS — R071 Chest pain on breathing: Secondary | ICD-10-CM | POA: Diagnosis not present

## 2015-09-04 DIAGNOSIS — I700 Atherosclerosis of aorta: Secondary | ICD-10-CM | POA: Diagnosis not present

## 2015-09-04 DIAGNOSIS — I129 Hypertensive chronic kidney disease with stage 1 through stage 4 chronic kidney disease, or unspecified chronic kidney disease: Secondary | ICD-10-CM | POA: Diagnosis not present

## 2015-09-04 DIAGNOSIS — N183 Chronic kidney disease, stage 3 (moderate): Secondary | ICD-10-CM | POA: Diagnosis not present

## 2015-09-17 DIAGNOSIS — H25811 Combined forms of age-related cataract, right eye: Secondary | ICD-10-CM | POA: Diagnosis not present

## 2015-09-17 DIAGNOSIS — H2511 Age-related nuclear cataract, right eye: Secondary | ICD-10-CM | POA: Diagnosis not present

## 2015-09-17 DIAGNOSIS — H25011 Cortical age-related cataract, right eye: Secondary | ICD-10-CM | POA: Diagnosis not present

## 2015-09-17 DIAGNOSIS — H21561 Pupillary abnormality, right eye: Secondary | ICD-10-CM | POA: Diagnosis not present

## 2015-09-26 DIAGNOSIS — C61 Malignant neoplasm of prostate: Secondary | ICD-10-CM | POA: Diagnosis not present

## 2015-10-02 DIAGNOSIS — M545 Low back pain: Secondary | ICD-10-CM | POA: Diagnosis not present

## 2015-10-02 DIAGNOSIS — M9904 Segmental and somatic dysfunction of sacral region: Secondary | ICD-10-CM | POA: Diagnosis not present

## 2015-10-02 DIAGNOSIS — M9903 Segmental and somatic dysfunction of lumbar region: Secondary | ICD-10-CM | POA: Diagnosis not present

## 2015-10-02 DIAGNOSIS — M9901 Segmental and somatic dysfunction of cervical region: Secondary | ICD-10-CM | POA: Diagnosis not present

## 2015-10-03 ENCOUNTER — Ambulatory Visit (HOSPITAL_COMMUNITY)
Admission: RE | Admit: 2015-10-03 | Discharge: 2015-10-03 | Disposition: A | Discharge status: Home / Self Care | Payer: Medicare Other | Source: Ambulatory Visit | Attending: Urology | Admitting: Urology

## 2015-10-03 DIAGNOSIS — R938 Abnormal findings on diagnostic imaging of other specified body structures: Secondary | ICD-10-CM | POA: Insufficient documentation

## 2015-10-03 DIAGNOSIS — C61 Malignant neoplasm of prostate: Secondary | ICD-10-CM | POA: Insufficient documentation

## 2015-10-03 MED ORDER — GADOBENATE DIMEGLUMINE 529 MG/ML IV SOLN
20.0000 mL | Freq: Once | INTRAVENOUS | Status: AC | PRN
  Administered 2015-10-03: 18 mL via INTRAVENOUS

## 2015-10-09 DIAGNOSIS — D075 Carcinoma in situ of prostate: Secondary | ICD-10-CM | POA: Diagnosis not present

## 2015-10-09 DIAGNOSIS — R972 Elevated prostate specific antigen [PSA]: Secondary | ICD-10-CM | POA: Diagnosis not present

## 2015-10-09 DIAGNOSIS — C61 Malignant neoplasm of prostate: Secondary | ICD-10-CM | POA: Diagnosis not present

## 2015-10-30 DIAGNOSIS — M9904 Segmental and somatic dysfunction of sacral region: Secondary | ICD-10-CM | POA: Diagnosis not present

## 2015-10-30 DIAGNOSIS — M545 Low back pain: Secondary | ICD-10-CM | POA: Diagnosis not present

## 2015-10-30 DIAGNOSIS — M9901 Segmental and somatic dysfunction of cervical region: Secondary | ICD-10-CM | POA: Diagnosis not present

## 2015-10-30 DIAGNOSIS — M9903 Segmental and somatic dysfunction of lumbar region: Secondary | ICD-10-CM | POA: Diagnosis not present

## 2015-11-22 DIAGNOSIS — M79674 Pain in right toe(s): Secondary | ICD-10-CM | POA: Diagnosis not present

## 2015-11-22 DIAGNOSIS — B351 Tinea unguium: Secondary | ICD-10-CM | POA: Diagnosis not present

## 2015-11-22 DIAGNOSIS — M79675 Pain in left toe(s): Secondary | ICD-10-CM | POA: Diagnosis not present

## 2015-11-29 ENCOUNTER — Encounter (HOSPITAL_COMMUNITY): Payer: Self-pay

## 2015-11-29 ENCOUNTER — Observation Stay (HOSPITAL_COMMUNITY)
Admission: EM | Admit: 2015-11-29 | Discharge: 2015-11-30 | Disposition: A | Discharge status: Home / Self Care | Payer: Medicare Other | Attending: Internal Medicine | Admitting: Internal Medicine

## 2015-11-29 ENCOUNTER — Emergency Department (HOSPITAL_COMMUNITY): Payer: Medicare Other

## 2015-11-29 DIAGNOSIS — Z7982 Long term (current) use of aspirin: Secondary | ICD-10-CM | POA: Insufficient documentation

## 2015-11-29 DIAGNOSIS — Z96641 Presence of right artificial hip joint: Secondary | ICD-10-CM | POA: Diagnosis not present

## 2015-11-29 DIAGNOSIS — Z79899 Other long term (current) drug therapy: Secondary | ICD-10-CM | POA: Diagnosis not present

## 2015-11-29 DIAGNOSIS — E785 Hyperlipidemia, unspecified: Secondary | ICD-10-CM | POA: Insufficient documentation

## 2015-11-29 DIAGNOSIS — R079 Chest pain, unspecified: Secondary | ICD-10-CM | POA: Diagnosis present

## 2015-11-29 DIAGNOSIS — N183 Chronic kidney disease, stage 3 unspecified: Secondary | ICD-10-CM | POA: Diagnosis present

## 2015-11-29 DIAGNOSIS — Z8546 Personal history of malignant neoplasm of prostate: Secondary | ICD-10-CM | POA: Insufficient documentation

## 2015-11-29 DIAGNOSIS — J449 Chronic obstructive pulmonary disease, unspecified: Secondary | ICD-10-CM | POA: Insufficient documentation

## 2015-11-29 DIAGNOSIS — I129 Hypertensive chronic kidney disease with stage 1 through stage 4 chronic kidney disease, or unspecified chronic kidney disease: Secondary | ICD-10-CM | POA: Diagnosis not present

## 2015-11-29 DIAGNOSIS — I10 Essential (primary) hypertension: Secondary | ICD-10-CM | POA: Diagnosis present

## 2015-11-29 DIAGNOSIS — Z87891 Personal history of nicotine dependence: Secondary | ICD-10-CM | POA: Diagnosis not present

## 2015-11-29 DIAGNOSIS — N1831 Chronic kidney disease, stage 3a: Secondary | ICD-10-CM | POA: Diagnosis present

## 2015-11-29 DIAGNOSIS — R0789 Other chest pain: Principal | ICD-10-CM | POA: Insufficient documentation

## 2015-11-29 DIAGNOSIS — R0602 Shortness of breath: Secondary | ICD-10-CM | POA: Diagnosis not present

## 2015-11-29 LAB — CBC
HCT: 43.6 % (ref 39.0–52.0)
Hemoglobin: 14.8 g/dL (ref 13.0–17.0)
MCH: 31.3 pg (ref 26.0–34.0)
MCHC: 33.9 g/dL (ref 30.0–36.0)
MCV: 92.2 fL (ref 78.0–100.0)
Platelets: 214 10*3/uL (ref 150–400)
RBC: 4.73 MIL/uL (ref 4.22–5.81)
RDW: 13.1 % (ref 11.5–15.5)
WBC: 10.6 10*3/uL — ABNORMAL HIGH (ref 4.0–10.5)

## 2015-11-29 LAB — BASIC METABOLIC PANEL
Anion gap: 11 (ref 5–15)
BUN: 21 mg/dL — ABNORMAL HIGH (ref 6–20)
CO2: 21 mmol/L — ABNORMAL LOW (ref 22–32)
Calcium: 9 mg/dL (ref 8.9–10.3)
Chloride: 104 mmol/L (ref 101–111)
Creatinine, Ser: 1.36 mg/dL — ABNORMAL HIGH (ref 0.61–1.24)
GFR calc Af Amer: 54 mL/min — ABNORMAL LOW (ref >60)
GFR calc non Af Amer: 46 mL/min — ABNORMAL LOW (ref >60)
Glucose, Bld: 151 mg/dL — ABNORMAL HIGH (ref 65–99)
Potassium: 5 mmol/L (ref 3.5–5.1)
Sodium: 136 mmol/L (ref 135–145)

## 2015-11-29 LAB — CREATININE, SERUM
Creatinine, Ser: 1.34 mg/dL — ABNORMAL HIGH (ref 0.61–1.24)
GFR calc Af Amer: 55 mL/min — ABNORMAL LOW (ref >60)
GFR calc non Af Amer: 47 mL/min — ABNORMAL LOW (ref >60)

## 2015-11-29 LAB — TROPONIN I
Troponin I: <0.03 ng/mL (ref <0.03)
Troponin I: <0.03 ng/mL (ref <0.03)
Troponin I: <0.03 ng/mL (ref <0.03)

## 2015-11-29 LAB — I-STAT TROPONIN, ED
Troponin i, poc: 0 ng/mL (ref 0.00–0.08)
Troponin i, poc: 0 ng/mL (ref 0.00–0.08)

## 2015-11-29 LAB — SEDIMENTATION RATE: Sed Rate: 10 mm/hr (ref 0–16)

## 2015-11-29 MED ORDER — LOSARTAN POTASSIUM 25 MG PO TABS: 25.0000 mg | ORAL_TABLET | Freq: Every morning | ORAL | Status: DC

## 2015-11-29 MED ORDER — ZOLPIDEM TARTRATE 5 MG PO TABS
5.0000 mg | ORAL_TABLET | Freq: Every evening | ORAL | Status: DC | PRN
  Administered 2015-11-29: 5 mg via ORAL
  Filled 2015-11-29: qty 1

## 2015-11-29 MED ORDER — ONE-A-DAY MENS 50+ ADVANTAGE PO TABS: 1.0000 | ORAL_TABLET | Freq: Every day | ORAL | Status: DC

## 2015-11-29 MED ORDER — ACETAMINOPHEN 325 MG PO TABS: 650.0000 mg | ORAL_TABLET | ORAL | Status: DC | PRN

## 2015-11-29 MED ORDER — HEPARIN SODIUM (PORCINE) 5000 UNIT/ML IJ SOLN
5000.0000 [IU] | Freq: Three times a day (TID) | INTRAMUSCULAR | Status: DC
  Administered 2015-11-29 – 2015-11-30 (×3): 5000 [IU] via SUBCUTANEOUS
  Filled 2015-11-29 (×3): qty 1

## 2015-11-29 MED ORDER — OMEGA-3-ACID ETHYL ESTERS 1 G PO CAPS
1.0000 g | ORAL_CAPSULE | Freq: Two times a day (BID) | ORAL | Status: DC
  Administered 2015-11-29 – 2015-11-30 (×2): 1 g via ORAL
  Filled 2015-11-29 (×2): qty 1

## 2015-11-29 MED ORDER — LOSARTAN POTASSIUM 25 MG PO TABS
25.0000 mg | ORAL_TABLET | Freq: Every morning | ORAL | Status: DC
  Administered 2015-11-30: 25 mg via ORAL
  Filled 2015-11-29: qty 1

## 2015-11-29 MED ORDER — ASPIRIN 81 MG PO CHEW
81.0000 mg | CHEWABLE_TABLET | Freq: Every day | ORAL | Status: DC
  Administered 2015-11-30: 81 mg via ORAL
  Filled 2015-11-29: qty 1

## 2015-11-29 MED ORDER — PRAVASTATIN SODIUM 40 MG PO TABS
40.0000 mg | ORAL_TABLET | Freq: Every evening | ORAL | Status: DC
  Administered 2015-11-29: 40 mg via ORAL
  Filled 2015-11-29: qty 1

## 2015-11-29 MED ORDER — ONDANSETRON HCL 4 MG/2ML IJ SOLN: 4.0000 mg | Freq: Four times a day (QID) | INTRAMUSCULAR | Status: DC | PRN

## 2015-11-29 MED ORDER — TAMSULOSIN HCL 0.4 MG PO CAPS
0.4000 mg | ORAL_CAPSULE | Freq: Every day | ORAL | Status: DC
  Administered 2015-11-29 – 2015-11-30 (×2): 0.4 mg via ORAL
  Filled 2015-11-29 (×2): qty 1

## 2015-11-29 MED ORDER — GI COCKTAIL ~~LOC~~
30.0000 mL | Freq: Four times a day (QID) | ORAL | Status: DC | PRN
  Administered 2015-11-29: 30 mL via ORAL
  Filled 2015-11-29: qty 30

## 2015-11-29 MED ORDER — PREDNISONE 10 MG PO TABS
50.0000 mg | ORAL_TABLET | Freq: Every day | ORAL | Status: DC
  Administered 2015-11-29: 50 mg via ORAL
  Filled 2015-11-29: qty 2

## 2015-11-29 MED ORDER — PSYLLIUM 95 % PO PACK
1.0000 | PACK | Freq: Three times a day (TID) | ORAL | Status: DC
  Administered 2015-11-29 – 2015-11-30 (×3): 1 via ORAL
  Filled 2015-11-29 (×3): qty 1

## 2015-11-29 MED ORDER — ADULT MULTIVITAMIN W/MINERALS CH
1.0000 | ORAL_TABLET | Freq: Every day | ORAL | Status: DC
  Administered 2015-11-30: 1 via ORAL
  Filled 2015-11-29: qty 1

## 2015-11-29 MED ORDER — ACETAMINOPHEN 500 MG PO TABS: 500.0000 mg | ORAL_TABLET | Freq: Four times a day (QID) | ORAL | Status: DC | PRN

## 2015-11-29 MED ORDER — FISH OIL 600 MG PO CAPS: 1800.0000 mg | ORAL_CAPSULE | Freq: Every morning | ORAL | Status: DC

## 2015-11-29 NOTE — ED Notes (Signed)
Pt returned to room from xray.

## 2015-11-29 NOTE — H&P (Signed)
History and Physical    DONATELLO KLEVE PJA:[id number] DOB: 1930/05/09 DOA: 11/29/2015  PCP: Mathews Argyle, MD Patient coming from: home [facility])  Chief Complaint: chest pain  HPI: DARREON LUTES is a very pleasant 82 y.o. male with medical history significant  for hypertension, hyperlipidemia, GERD, COPD, chronic kidney disease stage III, chronic back pain since to the emergency department with chest pain. Initial evaluation negative for cardiology etiology but patient being admitted for observation to rule out  Information is obtained from the patient he reports yesterday evening around 8 PM he developed some chest pain located mid sternum nonradiating. Described the pain as sharp waxed and waned from a 8 out of 10 to a 2 out of 10. He states he took Tylenol with no relief. After that he took aspirin with some relief. Lying on either side and movement makes it worse pain improved when lying on the back. Taken a deep breath made worse. He denies headache dizziness syncope or near-syncope. He denies lower extremity edema orthopnea diaphoresis nausea vomiting. He has COPD but is not on oxygen at home and has a mild cough chronically. He reports 2 months ago he had similar chest pain had a stress test at that time that was low risk. In addition CTA chest was negative for PE. He exercises frequently on the treadmill and can walk up 2 flights of stairs. While in the emergency department he ambulated to the bathroom oxygen saturation level dropped from 97-92% and his chest pain worsened.   He took aspirin around 7:30 this morning.  EMS was called he had a blood pressure 160/110 was given a nitroglycerin with some relief of pain and a blood pressure down to 97/68 which point a given 100 mL of normal saline.   ED Course: Emergency department she's afebrile hemodynamically stable and not hypoxic  Review of Systems: As per HPI otherwise 10 point review of systems negative.   Ambulatory Status:  Ambulates independently with no recent falls  Past Medical History:  Diagnosis Date  . Arthritis    "right hand; back" (08/30/2015)  . Cancer (HCC)    skin  squamous and basal  . Chronic lower back pain   . CKD (chronic kidney disease), stage III   . COPD (chronic obstructive pulmonary disease) (HCC)    no home O2  . Dysplastic colon polyp age 5   carcinoma in situ  . GERD (gastroesophageal reflux disease)    occ  . H/O hiatal hernia   . Hyperlipidemia   . Hypertension   . Prostate cancer ([facility]) 07/2014   active surveillance, Glisson 6    Past Surgical History:  Procedure Laterality Date  . CATARACT EXTRACTION W/ INTRAOCULAR LENS IMPLANT Left 08/27/15  . JOINT REPLACEMENT    . MOHS SURGERY     multiple SCC  . PROSTATE BIOPSY  07/2014  . TONSILLECTOMY    . TOTAL HIP ARTHROPLASTY  05/26/2011   Procedure: TOTAL HIP ARTHROPLASTY;  Surgeon: Garald Balding, MD;  Location: [facility];  Service: Orthopedics;  Laterality: Right;    Social History   Social History  . Marital status: Married    Spouse name: N/A  . Number of children: N/A  . Years of education: N/A   Occupational History  . retired 1996    Social History Main Topics  . Smoking status: Former Smoker    Packs/day: 0.50    Years: 25.00    Types: Cigarettes    Quit date: 05/24/1988  .  Smokeless tobacco: Never Used  . Alcohol use 6.0 oz/week    5 Glasses of wine, 5 Cans of beer per week  . Drug use: No  . Sexual activity: Not on file   Other Topics Concern  . Not on file   Social History Narrative  . No narrative on file    No Known Allergies  Family History  Problem Relation Age of Onset  . Cancer Mother     breast  . Diabetes Father     Prior to Admission medications   Medication Sig Start Date End Date Taking? Authorizing Provider  acetaminophen (TYLENOL) 500 MG tablet Take 500-1,000 mg by mouth every 6 (six) hours as needed for mild pain or moderate pain.   Yes Historical Provider, MD  aspirin 81  MG chewable tablet Chew 81 mg by mouth daily.    Yes Historical Provider, MD  losartan (COZAAR) 25 MG tablet Take 25 mg by mouth every morning. 07/30/15  Yes Historical Provider, MD  Multiple Vitamins-Minerals (ONE-A-DAY MENS 50+ ADVANTAGE) TABS Take 1 tablet by mouth daily with breakfast.   Yes Historical Provider, MD  Omega-3 Fatty Acids (FISH OIL) 600 MG CAPS Take 1,800 mg by mouth every morning.    Yes Historical Provider, MD  pravastatin (PRAVACHOL) 40 MG tablet Take 40 mg by mouth every evening.    Yes Historical Provider, MD  psyllium (METAMUCIL) 58.6 % packet Take 1 packet by mouth 3 (three) times daily.    Yes Historical Provider, MD  Tamsulosin HCl (FLOMAX) 0.4 MG CAPS Take 0.4 mg by mouth daily.   Yes Historical Provider, MD    Physical Exam: Vitals:   11/29/15 1114 11/29/15 1230 11/29/15 1330 11/29/15 1420  BP: 135/73 116/72 145/76 (!) 131/58  Pulse: 93 86 96 88  Resp: _0 Temp:    98.2 F (36.8 C)  TempSrc:    Oral  SpO2: 97% 95% 93% 96%     General:  Appears calm and comfortable, no acute distress sitting in the chair Eyes:  PERRL, EOMI, normal lids, iris ENT:  grossly normal hearing, lips & tongue, mucous membranes of his mouth are moist and pink Neck:  no LAD, masses or thyromegaly Cardiovascular:  RRR, no m/r/g. No LE edema. Pedal pulses present and palpable, chest nontender to palpation Respiratory:  CTA bilaterally, no w/r/r. Normal respiratory effort. Abdomen:  soft, ntnd, positive bowel sounds throughout no guarding or rebounding Skin:  no rash or induration seen on limited exam Musculoskeletal:  grossly normal tone BUE/BLE, good ROM, no bony abnormality, joints without swelling/erythema Psychiatric:  grossly normal mood and affect, speech fluent and appropriate, AOx3 Neurologic:  CN 2-12 grossly intact, moves all extremities in coordinated fashion, sensation intact  Labs on Admission: I have personally reviewed following labs and imaging  studies  CBC:  Recent Labs Lab 11/29/15 0900  WBC 10.6*  HGB 14.8  HCT 43.6  MCV 92.2  PLT 470   Basic Metabolic Panel:  Recent Labs Lab 11/29/15 0900 11/29/15 1348  NA 136  --   K 5.0  --   CL 104  --   CO2 21*  --   GLUCOSE 151*  --   BUN 21*  --   CREATININE 1.36* 1.34*  CALCIUM 9.0  --    GFR: CrCl cannot be calculated (Unknown ideal weight.). Liver Function Tests: No results for input(s): AST, ALT, ALKPHOS, BILITOT, PROT, ALBUMIN in the last 168 hours. No results for input(s): LIPASE, AMYLASE in  the last 168 hours. No results for input(s): AMMONIA in the last 168 hours. Coagulation Profile: No results for input(s): INR, PROTIME in the last 168 hours. Cardiac Enzymes:  Recent Labs Lab 11/29/15 1348  TROPONINI <0.03   BNP (last 3 results) No results for input(s): PROBNP in the last 8760 hours. HbA1C: No results for input(s): HGBA1C in the last 72 hours. CBG: No results for input(s): GLUCAP in the last 168 hours. Lipid Profile: No results for input(s): CHOL, HDL, LDLCALC, TRIG, CHOLHDL, LDLDIRECT in the last 72 hours. Thyroid Function Tests: No results for input(s): TSH, T4TOTAL, FREET4, T3FREE, THYROIDAB in the last 72 hours. Anemia Panel: No results for input(s): VITAMINB12, FOLATE, FERRITIN, TIBC, IRON, RETICCTPCT in the last 72 hours. Urine analysis:    Component Value Date/Time   COLORURINE YELLOW 05/25/2011 0935   APPEARANCEUR CLEAR 05/25/2011 0935   LABSPEC 1.021 05/25/2011 0935   PHURINE 5.5 05/25/2011 0935   GLUCOSEU NEGATIVE 05/25/2011 0935   HGBUR NEGATIVE 05/25/2011 0935   BILIRUBINUR NEGATIVE 05/25/2011 0935   KETONESUR NEGATIVE 05/25/2011 0935   PROTEINUR NEGATIVE 05/25/2011 0935   UROBILINOGEN 0.2 05/25/2011 0935   NITRITE NEGATIVE 05/25/2011 0935   LEUKOCYTESUR NEGATIVE 05/25/2011 0935    Creatinine Clearance: CrCl cannot be calculated (Unknown ideal weight.).  Sepsis Labs: _0 (procalcitonin:4,lacticidven:4) )No  results found for this or any previous visit (from the past 240 hour(s)).   Radiological Exams on Admission: Dg Chest 2 View  Result Date: 11/29/2015 CLINICAL DATA:  Chest pain and shortness breath starting last night. EXAM: CHEST  2 VIEW COMPARISON:  08/30/2015 FINDINGS: Atherosclerotic calcification of the aortic arch. Stable scarring along the left hemidiaphragm. Aortic tortuosity. Thoracic spondylosis. Degenerative findings of the right glenohumeral joint. No pleural effusion identified. IMPRESSION: 1. Stable scarring in the lingula and left lower lobe. 2. Thoracic spondylosis. 3. Atherosclerotic calcification of the aortic arch. Electronically Signed   By: Van Clines M.D.   On: 11/29/2015 09:55    EKG: Independently reviewed. Sinus rhythm Incomplete right bundle branch block No significant change since last tracing  Assessment/Plan Principal Problem:   Chest pain Active Problems:   Hypertension   COPD (chronic obstructive pulmonary disease) (HCC)   CKD (chronic kidney disease) stage 3, GFR 30-59 ml/min   Hyperlipidemia    #1. Chest pain. Atypical. Heart score 4. Chest x-ray unremarkable. Initial troponin negative. EKG without acute changes. Pain improved after aspirin. Had a workup in July unremarkable. Included a stress test that was low risk. A not reproducible so not likely musculoskeletal. CT a and July no PE. Pain is positional and somewhat pleuritic. ESR is 10 however pain improved after aspirin -Admit to telemetry -Cycle troponin -Serial EKG -Continue aspirin -continue statin -GI cocktail -Prednisone -Likely discharge in the a.m.  #2. Hypertension. Controlled in the emergency department. Home medications include losartan -Continue home meds -Monitor  #3. Chronic kidney disease stage III. Yesterday and 1.36. This appears close to baseline -Hold nephrotoxins -Very gentle IV fluids -Monitor urine output -Recheck in the morning  #4. COPD. He is not home  oxygen. Does not require inhalers or nebulizers. Appears stable at baseline. Chest x-ray as noted above    DVT prophylaxis: heparin Code Status: full  Family Communication: none present  Disposition Plan: home hopefully tomorrow Consults called: none  Admission status: obs    Dyanne Carrel M MD [facility]  If 7PM-7AM, please contact night-coverage [email or web address] Password [email or web address]  11/29/2015, 3:48 PM

## 2015-11-29 NOTE — ED Triage Notes (Signed)
Per EMS - pt went to gym yesterday as part of daily routine. Pt went to restroom in evening and had substernal cp, nonradiating w/o n/v. Laying on side and taking deep breath worsened CP. 324mg  aspirin taken around 0730 today. Given 1 nitro, relieved some pain. BP 160/110 to 97/60 after nitro, given 100cc and BP 120/80. Hx COPD, stage 3 kidney disease, htn, hyperlipidemia. Pt had hx same cp in august, stayed overnight and no cause of pain determined.

## 2015-11-29 NOTE — ED Notes (Signed)
Patient undressed, in gown, on monitor, continuous pulse oximetry and blood pressure cuff 

## 2015-11-29 NOTE — ED Notes (Addendum)
Pt reports increasing substernal CP when ambulating to restroom. This RN ambulated pt in hallway, SpO2 92-94% while ambulating. Resident aware. No new orders at this time.

## 2015-11-29 NOTE — ED Provider Notes (Signed)
[facility] DEPT Provider Note   CSN: [id number] Arrival date & time: 11/29/15  0850     History   Chief Complaint Chief Complaint  Patient presents with  . Chest Pain    HPI Marc Gilbert is a 82 year old man with history of CKD3, COPD, GERD, HTN, HLD.  HPI   He presents with chest pain. Located in mid sternum. Did not radiate. Felt warm. He was also short of breath. Started last night at [facility]. Was sitting down. Took some Tylenol with no relief. He took aspirin with some relief. Worsened by lying on side. Improved with lying on back. Worse with breathing in. [place]. BP was noted to be 749S systolic. Pain has diminished but currently feels dull and pressure like. He received nitro. No recent sickness or sick contacts.   He has a cough at baseline. Non productive. No orthopnea or PND.  2 months ago, had similar chest pain. He had a stress test at the time that was low risk. CTA chest negative for PE.  He has dyspnea with a lot of exertion. He is able to walk a mile on a treadmill and walk up two flights of stairs. Does not get chest pains with exertion.   Past Medical History:  Diagnosis Date  . Arthritis    "right hand; back" (08/30/2015)  . Cancer (HCC)    skin  squamous and basal  . Chronic lower back pain   . CKD (chronic kidney disease), stage III   . COPD (chronic obstructive pulmonary disease) (HCC)    no home O2  . Dysplastic colon polyp age 31   carcinoma in situ  . GERD (gastroesophageal reflux disease)    occ  . H/O hiatal hernia   . Hyperlipidemia   . Hypertension   . Prostate cancer ([facility]) 07/2014   active surveillance, Glisson 6    Patient Active Problem List   Diagnosis Date Noted  . Pain in the chest   . Chest pain 08/30/2015  . CKD (chronic kidney disease) stage 3, GFR 30-59 ml/min 08/30/2015  . Hyperlipidemia 08/30/2015  . Constipation due to pain medication 05/28/2011  . Postoperative anemia due to acute blood loss  05/28/2011  . Osteoarthritis of hip 05/01/2011  . Hypertension 05/01/2011  . COPD (chronic obstructive pulmonary disease) ([facility]) 05/01/2011  . Cancer of skin, face 05/01/2011    Past Surgical History:  Procedure Laterality Date  . CATARACT EXTRACTION W/ INTRAOCULAR LENS IMPLANT Left 08/27/15  . JOINT REPLACEMENT    . MOHS SURGERY     multiple SCC  . PROSTATE BIOPSY  07/2014  . TONSILLECTOMY    . TOTAL HIP ARTHROPLASTY  05/26/2011   Procedure: TOTAL HIP ARTHROPLASTY;  Surgeon: Garald Balding, MD;  Location: [facility];  Service: Orthopedics;  Laterality: Right;       Home Medications    Prior to Admission medications   Medication Sig Start Date End Date Taking? Authorizing Provider  acetaminophen (TYLENOL) 500 MG tablet Take 500-1,000 mg by mouth every 6 (six) hours as needed for mild pain or moderate pain.   Yes Historical Provider, MD  aspirin 81 MG chewable tablet Chew 81 mg by mouth daily.    Yes Historical Provider, MD  losartan (COZAAR) 25 MG tablet Take 25 mg by mouth every morning. 07/30/15  Yes Historical Provider, MD  Multiple Vitamins-Minerals (ONE-A-DAY MENS 50+ ADVANTAGE) TABS Take 1 tablet by mouth daily with breakfast.   Yes Historical Provider, MD  Omega-3 Fatty Acids (FISH OIL) 600 MG CAPS Take 1,800 mg by mouth every morning.    Yes Historical Provider, MD  pravastatin (PRAVACHOL) 40 MG tablet Take 40 mg by mouth every evening.    Yes Historical Provider, MD  psyllium (METAMUCIL) 58.6 % packet Take 1 packet by mouth 3 (three) times daily.    Yes Historical Provider, MD  Tamsulosin HCl (FLOMAX) 0.4 MG CAPS Take 0.4 mg by mouth daily.   Yes Historical Provider, MD    Family History Family History  Problem Relation Age of Onset  . Cancer Mother     breast  . Diabetes Father     Social History Social History  Substance Use Topics  . Smoking status: Former Smoker    Packs/day: 0.50    Years: 25.00    Types: Cigarettes    Quit date: 05/24/1988  . Smokeless  tobacco: Never Used  . Alcohol use 6.0 oz/week    5 Glasses of wine, 5 Cans of beer per week     Allergies   Review of patient's allergies indicates no known allergies.   Review of Systems Review of Systems Constitutional: no fevers/chills Eyes: no vision changes Ears, nose, mouth, throat, and face: +cough Respiratory: +shortness of breath Cardiovascular: +chest pain Gastrointestinal: no nausea/vomiting, no abdominal pain, no constipation, no diarrhea Genitourinary: no dysuria, no hematuria Integument: no rash Hematologic/lymphatic: no bleeding/bruising, no edema Musculoskeletal: no arthralgias, no myalgias Neurological: no paresthesias, no weakness   Physical Exam Updated Vital Signs BP 135/73   Pulse 93   Temp 99 F (37.2 C) (Oral)   Resp 26   SpO2 97%   Physical Exam General Apperance: NAD Head: Normocephalic, atraumatic Eyes: PERRL, EOMI, anicteric sclera Ears: Normal external ear canal Nose: Nares normal, septum midline, mucosa normal Throat: Lips, mucosa and tongue normal  Neck: Supple, trachea midline Back: No tenderness or bony abnormality  Lungs: Clear to auscultation bilaterally. No wheezes, rhonchi or rales. Breathing comfortably Chest Wall: Nontender, no deformity Heart: Regular rate and rhythm, no murmur/rub/gallop Abdomen: Soft, nontender, nondistended, no rebound/guarding Extremities: Normal, atraumatic, warm and well perfused, no edema Pulses: 2+ throughout Skin: No rashes or lesions Neurologic: Alert and oriented x 3. CNII-XII intact. Normal strength and sensation   ED Treatments / Results  Labs (all labs ordered are listed, but only abnormal results are displayed) Labs Reviewed  BASIC METABOLIC PANEL - Abnormal; Notable for the following:       Result Value   CO2 21 (*)    Glucose, Bld 151 (*)    BUN 21 (*)    Creatinine, Ser 1.36 (*)    GFR calc non Af Amer 46 (*)    GFR calc Af Amer 54 (*)    All other components within normal  limits  CBC - Abnormal; Notable for the following:    WBC 10.6 (*)    All other components within normal limits  SEDIMENTATION RATE  I-STAT TROPOININ, ED  I-STAT TROPOININ, ED    EKG  EKG Interpretation  Date/Time:  Friday November 29 2015 08:56:35 EDT Ventricular Rate:  86 PR Interval:    QRS Duration: 126 QT Interval:  373 QTC Calculation: 447 R Axis:   -28 Text Interpretation:  Sinus rhythm Incomplete right bundle branch block No significant change since last tracing Confirmed by KNOTT MD, Quillian Quince ([id number]) on 11/29/2015 9:41:29 AM Also confirmed by Laneta Simmers MD, DANIEL [phone]), editor Rolla Plate, Joelene Millin [phone])  on 11/29/2015 11:16:22 AM       Radiology  Dg Chest 2 View  Result Date: 11/29/2015 CLINICAL DATA:  Chest pain and shortness breath starting last night. EXAM: CHEST  2 VIEW COMPARISON:  08/30/2015 FINDINGS: Atherosclerotic calcification of the aortic arch. Stable scarring along the left hemidiaphragm. Aortic tortuosity. Thoracic spondylosis. Degenerative findings of the right glenohumeral joint. No pleural effusion identified. IMPRESSION: 1. Stable scarring in the lingula and left lower lobe. 2. Thoracic spondylosis. 3. Atherosclerotic calcification of the aortic arch. Electronically Signed   By: Van Clines M.D.   On: 11/29/2015 09:55   Procedures   Medications Ordered in ED Medications - No data to display   Initial Impression / Assessment and Plan / ED Course  I have reviewed the triage vital signs and the nursing notes.  Pertinent labs & imaging results that were available during my care of the patient were reviewed by me and considered in my medical decision making (see chart for details).  Clinical Course   9:37am evaluated pt 11:15am reevaluated pt. He has some discomfort with walking and deep breathing still.   Final Clinical Impressions(s) / ED Diagnoses   Final diagnoses:  None  Chest pain is not reproducible on palpation making MSK etiology  unlikely. No PE on recent CTA chest. Recent stress test with low probability. Initial troponin negative and EKG without acute ischemic changes. Repeat troponin negative. BMP with cr at around baseline of 1.2 to 1.3. CO2 21 and no anion gap. WBC 10.6 and afebrile. CXR without acute cardiopulmonary process. His chest pain is positional and pleuritic in nature. ESR 10, though. No hemodynamic compromise. HEART score 4. Given his ongoing substernal chest pain with exertion, will have him admitted for observation.   New Prescriptions New Prescriptions   No medications on file     Milagros Loll, MD 12/04/15 1556    Leo Grosser, MD 12/04/15 1757

## 2015-11-30 DIAGNOSIS — N183 Chronic kidney disease, stage 3 (moderate): Secondary | ICD-10-CM

## 2015-11-30 DIAGNOSIS — Z7982 Long term (current) use of aspirin: Secondary | ICD-10-CM | POA: Diagnosis not present

## 2015-11-30 DIAGNOSIS — J438 Other emphysema: Secondary | ICD-10-CM | POA: Diagnosis not present

## 2015-11-30 DIAGNOSIS — Z79899 Other long term (current) drug therapy: Secondary | ICD-10-CM | POA: Diagnosis not present

## 2015-11-30 DIAGNOSIS — J449 Chronic obstructive pulmonary disease, unspecified: Secondary | ICD-10-CM | POA: Diagnosis not present

## 2015-11-30 DIAGNOSIS — I129 Hypertensive chronic kidney disease with stage 1 through stage 4 chronic kidney disease, or unspecified chronic kidney disease: Secondary | ICD-10-CM | POA: Diagnosis not present

## 2015-11-30 DIAGNOSIS — R072 Precordial pain: Secondary | ICD-10-CM

## 2015-11-30 DIAGNOSIS — Z8546 Personal history of malignant neoplasm of prostate: Secondary | ICD-10-CM | POA: Diagnosis not present

## 2015-11-30 DIAGNOSIS — E784 Other hyperlipidemia: Secondary | ICD-10-CM | POA: Diagnosis not present

## 2015-11-30 DIAGNOSIS — E785 Hyperlipidemia, unspecified: Secondary | ICD-10-CM | POA: Diagnosis not present

## 2015-11-30 DIAGNOSIS — I10 Essential (primary) hypertension: Secondary | ICD-10-CM

## 2015-11-30 DIAGNOSIS — Z96641 Presence of right artificial hip joint: Secondary | ICD-10-CM | POA: Diagnosis not present

## 2015-11-30 DIAGNOSIS — R0789 Other chest pain: Secondary | ICD-10-CM | POA: Diagnosis not present

## 2015-11-30 DIAGNOSIS — Z87891 Personal history of nicotine dependence: Secondary | ICD-10-CM | POA: Diagnosis not present

## 2015-11-30 LAB — BASIC METABOLIC PANEL
Anion gap: 7 (ref 5–15)
BUN: 23 mg/dL — ABNORMAL HIGH (ref 6–20)
CO2: 24 mmol/L (ref 22–32)
Calcium: 9 mg/dL (ref 8.9–10.3)
Chloride: 107 mmol/L (ref 101–111)
Creatinine, Ser: 1.18 mg/dL (ref 0.61–1.24)
GFR calc Af Amer: >60 mL/min (ref >60)
GFR calc non Af Amer: 55 mL/min — ABNORMAL LOW (ref >60)
Glucose, Bld: 140 mg/dL — ABNORMAL HIGH (ref 65–99)
Potassium: 4.4 mmol/L (ref 3.5–5.1)
Sodium: 138 mmol/L (ref 135–145)

## 2015-11-30 NOTE — Discharge Summary (Signed)
Physician Discharge Summary  Marc Gilbert [id number] DOB: May 24, 1930 DOA: 11/29/2015  PCP: Mathews Argyle, MD  Admit date: 11/29/2015 Discharge date: 11/30/2015  Admitted From: Home/ILF--[place] Disposition:  [place]  Recommendations for Outpatient Follow-up:  1. Follow up with PCP in 1-2 weeks   Home Health:no Equipment/Devices:none  Discharge Condition:Stable CODE STATUS:FULL Diet recommendation: Heart Healthy   Brief/Interim Summary:  82 y.o. male with medical history significant  for hypertension, hyperlipidemia, GERD, COPD, chronic kidney disease stage III, chronic back pain since to the emergency department with chest pain. Initial evaluation negative for cardiology etiology but patient being admitted for observation to rule out  Information is obtained from the patient he reports yesterday evening around 8 PM he developed some chest pain located mid sternum nonradiating. Described the pain as sharp waxed and waned from a 8 out of 10 to a 2 out of 10. He states he took Tylenol with no relief. After that he took aspirin with some relief. Lying on either side and movement makes it worse pain improved when lying on the back. Taken a deep breath made worse. He denies headache dizziness syncope or near-syncope. He denies lower extremity edema orthopnea diaphoresis nausea vomiting. He has COPD but is not on oxygen at home and has a mild cough chronically. He reports 2 months ago he had similar chest pain had a stress test at that time that was low risk. In addition CTA chest was negative for PE. He exercises frequently on the treadmill and can walk up 2 flights of stairs.  Pt stayed 24 hours with neg troponins x3 and no telemetry events.  He remained clinically stable without further CP.  He was discharged to followup with PCP  Discharge Diagnoses:  Atypical chest pain -likely musculoskeletal -pt is able to tolerate walking on treadmill, water exercises, and cardio  exercises 3-4 times per week without difficulty, CP, nor loss or endurance -troponins neg x 3 -08/30/15 CTA chest neg for PE -08/31/15 myoview--low risk, EF 47% -08/31/15 Echo EF 45-50%, no WMA -pt not interested in cardiac catheterization or invasive procedures -continue ASA, statin  CKD 3  -baseline creatinine 1.1-1.3  HTN -controlled on losartan  COPD -stable on RA -no desaturation with ambulation   Discharge Instructions  Discharge Instructions    Diet - low sodium heart healthy    Complete by:  As directed    Increase activity slowly    Complete by:  As directed        Medication List    TAKE these medications   acetaminophen 500 MG tablet Commonly known as:  TYLENOL Take 500-1,000 mg by mouth every 6 (six) hours as needed for mild pain or moderate pain.   aspirin 81 MG chewable tablet Chew 81 mg by mouth daily.   Fish Oil 600 MG Caps Take 1,800 mg by mouth every morning.   losartan 25 MG tablet Commonly known as:  COZAAR Take 25 mg by mouth every morning.   ONE-A-DAY MENS 50+ ADVANTAGE Tabs Take 1 tablet by mouth daily with breakfast.   pravastatin 40 MG tablet Commonly known as:  PRAVACHOL Take 40 mg by mouth every evening.   psyllium 58.6 % packet Commonly known as:  METAMUCIL Take 1 packet by mouth 3 (three) times daily.   tamsulosin 0.4 MG Caps capsule Commonly known as:  FLOMAX Take 0.4 mg by mouth daily.       No Known Allergies  Consultations:  none   Procedures/Studies: Dg Chest 2 View  Result  Date: 11/29/2015 CLINICAL DATA:  Chest pain and shortness breath starting last night. EXAM: CHEST  2 VIEW COMPARISON:  08/30/2015 FINDINGS: Atherosclerotic calcification of the aortic arch. Stable scarring along the left hemidiaphragm. Aortic tortuosity. Thoracic spondylosis. Degenerative findings of the right glenohumeral joint. No pleural effusion identified. IMPRESSION: 1. Stable scarring in the lingula and left lower lobe. 2. Thoracic  spondylosis. 3. Atherosclerotic calcification of the aortic arch. Electronically Signed   By: Van Clines M.D.   On: 11/29/2015 09:55        Discharge Exam: Vitals:   11/30/15 0010 11/30/15 0436  BP: (!) 110/54 (!) 142/78  Pulse: 76 89  Resp: 20 20  Temp: 98.2 F (36.8 C) 98.2 F (36.8 C)   Vitals:   11/29/15 1420 11/29/15 2021 11/30/15 0010 11/30/15 0436  BP: (!) 131/58 132/63 (!) 110/54 (!) 142/78  Pulse: 88 81 76 89  Resp: 20 20 20 20   Temp: 98.2 F (36.8 C) 99.2 F (37.3 C) 98.2 F (36.8 C) 98.2 F (36.8 C)  TempSrc: Oral Oral Oral Oral  SpO2: 96% 93% 93% 96%  Weight:    88.7 kg (195 lb 9.6 oz)    General: Pt is alert, awake, not in acute distress Cardiovascular: RRR, S1/S2 +, no rubs, no gallops Respiratory: CTA bilaterally, no wheezing, no rhonchi Abdominal: Soft, NT, ND, bowel sounds + Extremities: no edema, no cyanosis   The results of significant diagnostics from this hospitalization (including imaging, microbiology, ancillary and laboratory) are listed below for reference.    Significant Diagnostic Studies: Dg Chest 2 View  Result Date: 11/29/2015 CLINICAL DATA:  Chest pain and shortness breath starting last night. EXAM: CHEST  2 VIEW COMPARISON:  08/30/2015 FINDINGS: Atherosclerotic calcification of the aortic arch. Stable scarring along the left hemidiaphragm. Aortic tortuosity. Thoracic spondylosis. Degenerative findings of the right glenohumeral joint. No pleural effusion identified. IMPRESSION: 1. Stable scarring in the lingula and left lower lobe. 2. Thoracic spondylosis. 3. Atherosclerotic calcification of the aortic arch. Electronically Signed   By: Van Clines M.D.   On: 11/29/2015 09:55     Microbiology: No results found for this or any previous visit (from the past 240 hour(s)).   Labs: Basic Metabolic Panel:  Recent Labs Lab 11/29/15 0900 11/29/15 1348 11/30/15 0403  NA 136  --  138  K 5.0  --  4.4  CL 104  --  107    CO2 21*  --  24  GLUCOSE 151*  --  140*  BUN 21*  --  23*  CREATININE 1.36* 1.34* 1.18  CALCIUM 9.0  --  9.0   Liver Function Tests: No results for input(s): AST, ALT, ALKPHOS, BILITOT, PROT, ALBUMIN in the last 168 hours. No results for input(s): LIPASE, AMYLASE in the last 168 hours. No results for input(s): AMMONIA in the last 168 hours. CBC:  Recent Labs Lab 11/29/15 0900  WBC 10.6*  HGB 14.8  HCT 43.6  MCV 92.2  PLT 214   Cardiac Enzymes:  Recent Labs Lab 11/29/15 1348 11/29/15 1617 11/29/15 1918  TROPONINI <0.03 <0.03 <0.03   BNP: Invalid input(s): POCBNP CBG: No results for input(s): GLUCAP in the last 168 hours.  Time coordinating discharge:  Greater than 30 minutes  Signed:  Casson Catena, DO [facility] Pager: [phone] 11/30/2015, 8:49 AM

## 2015-11-30 NOTE — Progress Notes (Signed)
Marc Gilbert to be D/C'd Home per MD order. Discussed with the patient and all questions fully answered.    VVS, Skin clean, dry and intact without evidence of skin break down, no evidence of skin tears noted.  IV catheter discontinued intact. Site without signs and symptoms of complications. Dressing and pressure applied.  An After Visit Summary was printed and given to the patient.  Patient escorted via [place], and D/C home via private auto.  [name]  11/30/2015 2:49 PM

## 2015-12-02 DIAGNOSIS — M9903 Segmental and somatic dysfunction of lumbar region: Secondary | ICD-10-CM | POA: Diagnosis not present

## 2015-12-02 DIAGNOSIS — M9901 Segmental and somatic dysfunction of cervical region: Secondary | ICD-10-CM | POA: Diagnosis not present

## 2015-12-02 DIAGNOSIS — M9904 Segmental and somatic dysfunction of sacral region: Secondary | ICD-10-CM | POA: Diagnosis not present

## 2015-12-02 DIAGNOSIS — M545 Low back pain: Secondary | ICD-10-CM | POA: Diagnosis not present

## 2015-12-04 DIAGNOSIS — R0789 Other chest pain: Secondary | ICD-10-CM | POA: Diagnosis not present

## 2015-12-04 DIAGNOSIS — I129 Hypertensive chronic kidney disease with stage 1 through stage 4 chronic kidney disease, or unspecified chronic kidney disease: Secondary | ICD-10-CM | POA: Diagnosis not present

## 2015-12-04 DIAGNOSIS — N183 Chronic kidney disease, stage 3 (moderate): Secondary | ICD-10-CM | POA: Diagnosis not present

## 2015-12-19 DIAGNOSIS — L821 Other seborrheic keratosis: Secondary | ICD-10-CM | POA: Diagnosis not present

## 2015-12-19 DIAGNOSIS — Z85828 Personal history of other malignant neoplasm of skin: Secondary | ICD-10-CM | POA: Diagnosis not present

## 2015-12-19 DIAGNOSIS — Z23 Encounter for immunization: Secondary | ICD-10-CM | POA: Diagnosis not present

## 2015-12-19 DIAGNOSIS — Z86018 Personal history of other benign neoplasm: Secondary | ICD-10-CM | POA: Diagnosis not present

## 2015-12-19 DIAGNOSIS — L570 Actinic keratosis: Secondary | ICD-10-CM | POA: Diagnosis not present

## 2015-12-30 DIAGNOSIS — M545 Low back pain: Secondary | ICD-10-CM | POA: Diagnosis not present

## 2015-12-30 DIAGNOSIS — M9904 Segmental and somatic dysfunction of sacral region: Secondary | ICD-10-CM | POA: Diagnosis not present

## 2015-12-30 DIAGNOSIS — M9901 Segmental and somatic dysfunction of cervical region: Secondary | ICD-10-CM | POA: Diagnosis not present

## 2015-12-30 DIAGNOSIS — M9903 Segmental and somatic dysfunction of lumbar region: Secondary | ICD-10-CM | POA: Diagnosis not present

## 2016-01-27 DIAGNOSIS — M9904 Segmental and somatic dysfunction of sacral region: Secondary | ICD-10-CM | POA: Diagnosis not present

## 2016-01-27 DIAGNOSIS — M9903 Segmental and somatic dysfunction of lumbar region: Secondary | ICD-10-CM | POA: Diagnosis not present

## 2016-01-27 DIAGNOSIS — M9901 Segmental and somatic dysfunction of cervical region: Secondary | ICD-10-CM | POA: Diagnosis not present

## 2016-01-27 DIAGNOSIS — M545 Low back pain: Secondary | ICD-10-CM | POA: Diagnosis not present

## 2016-02-28 DIAGNOSIS — M9901 Segmental and somatic dysfunction of cervical region: Secondary | ICD-10-CM | POA: Diagnosis not present

## 2016-02-28 DIAGNOSIS — M9904 Segmental and somatic dysfunction of sacral region: Secondary | ICD-10-CM | POA: Diagnosis not present

## 2016-02-28 DIAGNOSIS — M9903 Segmental and somatic dysfunction of lumbar region: Secondary | ICD-10-CM | POA: Diagnosis not present

## 2016-02-28 DIAGNOSIS — M545 Low back pain: Secondary | ICD-10-CM | POA: Diagnosis not present

## 2016-03-13 DIAGNOSIS — M79675 Pain in left toe(s): Secondary | ICD-10-CM | POA: Diagnosis not present

## 2016-03-13 DIAGNOSIS — M79674 Pain in right toe(s): Secondary | ICD-10-CM | POA: Diagnosis not present

## 2016-03-13 DIAGNOSIS — B351 Tinea unguium: Secondary | ICD-10-CM | POA: Diagnosis not present

## 2016-03-23 DIAGNOSIS — R05 Cough: Secondary | ICD-10-CM | POA: Diagnosis not present

## 2016-03-23 DIAGNOSIS — I129 Hypertensive chronic kidney disease with stage 1 through stage 4 chronic kidney disease, or unspecified chronic kidney disease: Secondary | ICD-10-CM | POA: Diagnosis not present

## 2016-03-23 DIAGNOSIS — N183 Chronic kidney disease, stage 3 (moderate): Secondary | ICD-10-CM | POA: Diagnosis not present

## 2016-03-31 DIAGNOSIS — M9904 Segmental and somatic dysfunction of sacral region: Secondary | ICD-10-CM | POA: Diagnosis not present

## 2016-03-31 DIAGNOSIS — M545 Low back pain: Secondary | ICD-10-CM | POA: Diagnosis not present

## 2016-03-31 DIAGNOSIS — M9901 Segmental and somatic dysfunction of cervical region: Secondary | ICD-10-CM | POA: Diagnosis not present

## 2016-03-31 DIAGNOSIS — M9903 Segmental and somatic dysfunction of lumbar region: Secondary | ICD-10-CM | POA: Diagnosis not present

## 2016-04-08 DIAGNOSIS — R351 Nocturia: Secondary | ICD-10-CM | POA: Diagnosis not present

## 2016-04-08 DIAGNOSIS — N401 Enlarged prostate with lower urinary tract symptoms: Secondary | ICD-10-CM | POA: Diagnosis not present

## 2016-04-08 DIAGNOSIS — N5201 Erectile dysfunction due to arterial insufficiency: Secondary | ICD-10-CM | POA: Diagnosis not present

## 2016-04-08 DIAGNOSIS — C61 Malignant neoplasm of prostate: Secondary | ICD-10-CM | POA: Diagnosis not present

## 2016-04-28 DIAGNOSIS — M9901 Segmental and somatic dysfunction of cervical region: Secondary | ICD-10-CM | POA: Diagnosis not present

## 2016-04-28 DIAGNOSIS — M9904 Segmental and somatic dysfunction of sacral region: Secondary | ICD-10-CM | POA: Diagnosis not present

## 2016-04-28 DIAGNOSIS — M9903 Segmental and somatic dysfunction of lumbar region: Secondary | ICD-10-CM | POA: Diagnosis not present

## 2016-04-28 DIAGNOSIS — M545 Low back pain: Secondary | ICD-10-CM | POA: Diagnosis not present

## 2016-05-15 DIAGNOSIS — B351 Tinea unguium: Secondary | ICD-10-CM | POA: Diagnosis not present

## 2016-05-15 DIAGNOSIS — M79674 Pain in right toe(s): Secondary | ICD-10-CM | POA: Diagnosis not present

## 2016-05-15 DIAGNOSIS — M79675 Pain in left toe(s): Secondary | ICD-10-CM | POA: Diagnosis not present

## 2016-06-08 DIAGNOSIS — Z79899 Other long term (current) drug therapy: Secondary | ICD-10-CM | POA: Diagnosis not present

## 2016-06-08 DIAGNOSIS — Z1389 Encounter for screening for other disorder: Secondary | ICD-10-CM | POA: Diagnosis not present

## 2016-06-08 DIAGNOSIS — N183 Chronic kidney disease, stage 3 (moderate): Secondary | ICD-10-CM | POA: Diagnosis not present

## 2016-06-08 DIAGNOSIS — Z77090 Contact with and (suspected) exposure to asbestos: Secondary | ICD-10-CM | POA: Diagnosis not present

## 2016-06-08 DIAGNOSIS — J449 Chronic obstructive pulmonary disease, unspecified: Secondary | ICD-10-CM | POA: Diagnosis not present

## 2016-06-08 DIAGNOSIS — I700 Atherosclerosis of aorta: Secondary | ICD-10-CM | POA: Diagnosis not present

## 2016-06-08 DIAGNOSIS — Z0000 Encounter for general adult medical examination without abnormal findings: Secondary | ICD-10-CM | POA: Diagnosis not present

## 2016-06-08 DIAGNOSIS — J301 Allergic rhinitis due to pollen: Secondary | ICD-10-CM | POA: Diagnosis not present

## 2016-06-08 DIAGNOSIS — Z23 Encounter for immunization: Secondary | ICD-10-CM | POA: Diagnosis not present

## 2016-06-08 DIAGNOSIS — I129 Hypertensive chronic kidney disease with stage 1 through stage 4 chronic kidney disease, or unspecified chronic kidney disease: Secondary | ICD-10-CM | POA: Diagnosis not present

## 2016-06-08 DIAGNOSIS — E7800 Pure hypercholesterolemia, unspecified: Secondary | ICD-10-CM | POA: Diagnosis not present

## 2016-06-09 DIAGNOSIS — M9904 Segmental and somatic dysfunction of sacral region: Secondary | ICD-10-CM | POA: Diagnosis not present

## 2016-06-09 DIAGNOSIS — M9901 Segmental and somatic dysfunction of cervical region: Secondary | ICD-10-CM | POA: Diagnosis not present

## 2016-06-09 DIAGNOSIS — M9903 Segmental and somatic dysfunction of lumbar region: Secondary | ICD-10-CM | POA: Diagnosis not present

## 2016-06-09 DIAGNOSIS — M545 Low back pain: Secondary | ICD-10-CM | POA: Diagnosis not present

## 2016-07-08 DIAGNOSIS — M545 Low back pain: Secondary | ICD-10-CM | POA: Diagnosis not present

## 2016-07-08 DIAGNOSIS — M9901 Segmental and somatic dysfunction of cervical region: Secondary | ICD-10-CM | POA: Diagnosis not present

## 2016-07-08 DIAGNOSIS — M9903 Segmental and somatic dysfunction of lumbar region: Secondary | ICD-10-CM | POA: Diagnosis not present

## 2016-07-08 DIAGNOSIS — M9904 Segmental and somatic dysfunction of sacral region: Secondary | ICD-10-CM | POA: Diagnosis not present

## 2016-07-16 DIAGNOSIS — Z86018 Personal history of other benign neoplasm: Secondary | ICD-10-CM | POA: Diagnosis not present

## 2016-07-16 DIAGNOSIS — L821 Other seborrheic keratosis: Secondary | ICD-10-CM | POA: Diagnosis not present

## 2016-07-16 DIAGNOSIS — L570 Actinic keratosis: Secondary | ICD-10-CM | POA: Diagnosis not present

## 2016-07-16 DIAGNOSIS — Z85828 Personal history of other malignant neoplasm of skin: Secondary | ICD-10-CM | POA: Diagnosis not present

## 2016-07-16 DIAGNOSIS — M10071 Idiopathic gout, right ankle and foot: Secondary | ICD-10-CM | POA: Diagnosis not present

## 2016-07-17 DIAGNOSIS — M79674 Pain in right toe(s): Secondary | ICD-10-CM | POA: Diagnosis not present

## 2016-07-17 DIAGNOSIS — B351 Tinea unguium: Secondary | ICD-10-CM | POA: Diagnosis not present

## 2016-07-17 DIAGNOSIS — M79675 Pain in left toe(s): Secondary | ICD-10-CM | POA: Diagnosis not present

## 2016-08-06 DIAGNOSIS — M9903 Segmental and somatic dysfunction of lumbar region: Secondary | ICD-10-CM | POA: Diagnosis not present

## 2016-08-06 DIAGNOSIS — M545 Low back pain: Secondary | ICD-10-CM | POA: Diagnosis not present

## 2016-08-06 DIAGNOSIS — M9904 Segmental and somatic dysfunction of sacral region: Secondary | ICD-10-CM | POA: Diagnosis not present

## 2016-08-06 DIAGNOSIS — M9901 Segmental and somatic dysfunction of cervical region: Secondary | ICD-10-CM | POA: Diagnosis not present

## 2016-08-10 ENCOUNTER — Emergency Department (HOSPITAL_COMMUNITY): Payer: Medicare Other

## 2016-08-10 ENCOUNTER — Inpatient Hospital Stay (HOSPITAL_COMMUNITY)
Admission: EM | Admit: 2016-08-10 | Discharge: 2016-08-19 | DRG: 233 | Disposition: A | Discharge status: Home Health | Payer: Medicare Other | Attending: Surgery | Admitting: Surgery

## 2016-08-10 ENCOUNTER — Encounter (HOSPITAL_COMMUNITY): Payer: Self-pay

## 2016-08-10 DIAGNOSIS — R079 Chest pain, unspecified: Secondary | ICD-10-CM | POA: Diagnosis present

## 2016-08-10 DIAGNOSIS — Z7982 Long term (current) use of aspirin: Secondary | ICD-10-CM

## 2016-08-10 DIAGNOSIS — D62 Acute posthemorrhagic anemia: Secondary | ICD-10-CM | POA: Diagnosis not present

## 2016-08-10 DIAGNOSIS — D696 Thrombocytopenia, unspecified: Secondary | ICD-10-CM | POA: Diagnosis present

## 2016-08-10 DIAGNOSIS — N1831 Chronic kidney disease, stage 3a: Secondary | ICD-10-CM | POA: Diagnosis present

## 2016-08-10 DIAGNOSIS — N400 Enlarged prostate without lower urinary tract symptoms: Secondary | ICD-10-CM | POA: Diagnosis present

## 2016-08-10 DIAGNOSIS — G8929 Other chronic pain: Secondary | ICD-10-CM | POA: Diagnosis not present

## 2016-08-10 DIAGNOSIS — M1610 Unilateral primary osteoarthritis, unspecified hip: Secondary | ICD-10-CM | POA: Diagnosis not present

## 2016-08-10 DIAGNOSIS — I200 Unstable angina: Secondary | ICD-10-CM

## 2016-08-10 DIAGNOSIS — M109 Gout, unspecified: Secondary | ICD-10-CM | POA: Diagnosis present

## 2016-08-10 DIAGNOSIS — I10 Essential (primary) hypertension: Secondary | ICD-10-CM | POA: Diagnosis present

## 2016-08-10 DIAGNOSIS — Z961 Presence of intraocular lens: Secondary | ICD-10-CM | POA: Diagnosis present

## 2016-08-10 DIAGNOSIS — Z85828 Personal history of other malignant neoplasm of skin: Secondary | ICD-10-CM | POA: Diagnosis not present

## 2016-08-10 DIAGNOSIS — Z9842 Cataract extraction status, left eye: Secondary | ICD-10-CM

## 2016-08-10 DIAGNOSIS — I130 Hypertensive heart and chronic kidney disease with heart failure and stage 1 through stage 4 chronic kidney disease, or unspecified chronic kidney disease: Secondary | ICD-10-CM | POA: Diagnosis present

## 2016-08-10 DIAGNOSIS — I080 Rheumatic disorders of both mitral and aortic valves: Secondary | ICD-10-CM | POA: Diagnosis not present

## 2016-08-10 DIAGNOSIS — I25118 Atherosclerotic heart disease of native coronary artery with other forms of angina pectoris: Secondary | ICD-10-CM

## 2016-08-10 DIAGNOSIS — M545 Low back pain: Secondary | ICD-10-CM | POA: Diagnosis not present

## 2016-08-10 DIAGNOSIS — Z96641 Presence of right artificial hip joint: Secondary | ICD-10-CM | POA: Diagnosis present

## 2016-08-10 DIAGNOSIS — E784 Other hyperlipidemia: Secondary | ICD-10-CM | POA: Diagnosis not present

## 2016-08-10 DIAGNOSIS — K219 Gastro-esophageal reflux disease without esophagitis: Secondary | ICD-10-CM | POA: Diagnosis present

## 2016-08-10 DIAGNOSIS — Z01810 Encounter for preprocedural cardiovascular examination: Secondary | ICD-10-CM | POA: Diagnosis not present

## 2016-08-10 DIAGNOSIS — Z951 Presence of aortocoronary bypass graft: Secondary | ICD-10-CM

## 2016-08-10 DIAGNOSIS — Z87891 Personal history of nicotine dependence: Secondary | ICD-10-CM

## 2016-08-10 DIAGNOSIS — I255 Ischemic cardiomyopathy: Secondary | ICD-10-CM | POA: Diagnosis present

## 2016-08-10 DIAGNOSIS — J9811 Atelectasis: Secondary | ICD-10-CM | POA: Diagnosis not present

## 2016-08-10 DIAGNOSIS — J449 Chronic obstructive pulmonary disease, unspecified: Secondary | ICD-10-CM | POA: Diagnosis present

## 2016-08-10 DIAGNOSIS — I5031 Acute diastolic (congestive) heart failure: Secondary | ICD-10-CM | POA: Diagnosis not present

## 2016-08-10 DIAGNOSIS — J90 Pleural effusion, not elsewhere classified: Secondary | ICD-10-CM | POA: Diagnosis not present

## 2016-08-10 DIAGNOSIS — Z79899 Other long term (current) drug therapy: Secondary | ICD-10-CM | POA: Diagnosis not present

## 2016-08-10 DIAGNOSIS — N183 Chronic kidney disease, stage 3 unspecified: Secondary | ICD-10-CM | POA: Diagnosis present

## 2016-08-10 DIAGNOSIS — I214 Non-ST elevation (NSTEMI) myocardial infarction: Principal | ICD-10-CM | POA: Diagnosis present

## 2016-08-10 DIAGNOSIS — C61 Malignant neoplasm of prostate: Secondary | ICD-10-CM | POA: Diagnosis present

## 2016-08-10 DIAGNOSIS — I25110 Atherosclerotic heart disease of native coronary artery with unstable angina pectoris: Secondary | ICD-10-CM | POA: Diagnosis present

## 2016-08-10 DIAGNOSIS — R072 Precordial pain: Secondary | ICD-10-CM | POA: Diagnosis not present

## 2016-08-10 DIAGNOSIS — E785 Hyperlipidemia, unspecified: Secondary | ICD-10-CM | POA: Diagnosis not present

## 2016-08-10 DIAGNOSIS — Z4682 Encounter for fitting and adjustment of non-vascular catheter: Secondary | ICD-10-CM | POA: Diagnosis not present

## 2016-08-10 DIAGNOSIS — I129 Hypertensive chronic kidney disease with stage 1 through stage 4 chronic kidney disease, or unspecified chronic kidney disease: Secondary | ICD-10-CM | POA: Diagnosis not present

## 2016-08-10 LAB — BASIC METABOLIC PANEL
Anion gap: 8 (ref 5–15)
BUN: 22 mg/dL — ABNORMAL HIGH (ref 6–20)
CO2: 23 mmol/L (ref 22–32)
Calcium: 8.7 mg/dL — ABNORMAL LOW (ref 8.9–10.3)
Chloride: 105 mmol/L (ref 101–111)
Creatinine, Ser: 1.32 mg/dL — ABNORMAL HIGH (ref 0.61–1.24)
GFR calc Af Amer: 55 mL/min — ABNORMAL LOW (ref >60)
GFR calc non Af Amer: 48 mL/min — ABNORMAL LOW (ref >60)
Glucose, Bld: 121 mg/dL — ABNORMAL HIGH (ref 65–99)
Potassium: 4.4 mmol/L (ref 3.5–5.1)
Sodium: 136 mmol/L (ref 135–145)

## 2016-08-10 LAB — LIPID PANEL
Cholesterol: 157 mg/dL (ref 0–200)
HDL: 56 mg/dL (ref >40)
LDL Cholesterol: 87 mg/dL (ref 0–99)
Total CHOL/HDL Ratio: 2.8 RATIO
Triglycerides: 72 mg/dL (ref <150)
VLDL: 14 mg/dL (ref 0–40)

## 2016-08-10 LAB — I-STAT TROPONIN, ED
Troponin i, poc: 0.03 ng/mL (ref 0.00–0.08)
Troponin i, poc: 0.15 ng/mL (ref 0.00–0.08)

## 2016-08-10 LAB — CBC
HCT: 40.6 % (ref 39.0–52.0)
Hemoglobin: 13.6 g/dL (ref 13.0–17.0)
MCH: 31 pg (ref 26.0–34.0)
MCHC: 33.5 g/dL (ref 30.0–36.0)
MCV: 92.5 fL (ref 78.0–100.0)
Platelets: 180 10*3/uL (ref 150–400)
RBC: 4.39 MIL/uL (ref 4.22–5.81)
RDW: 12.7 % (ref 11.5–15.5)
WBC: 5.3 10*3/uL (ref 4.0–10.5)

## 2016-08-10 LAB — HEPARIN LEVEL (UNFRACTIONATED): Heparin Unfractionated: 0.43 IU/mL (ref 0.30–0.70)

## 2016-08-10 LAB — MRSA PCR SCREENING: MRSA by PCR: NEGATIVE

## 2016-08-10 LAB — MAGNESIUM: Magnesium: 1.9 mg/dL (ref 1.7–2.4)

## 2016-08-10 MED ORDER — ACETAMINOPHEN 325 MG PO TABS: 650.0000 mg | ORAL_TABLET | ORAL | Status: DC | PRN

## 2016-08-10 MED ORDER — HEPARIN (PORCINE) IN NACL 100-0.45 UNIT/ML-% IJ SOLN
1150.0000 [IU]/h | INTRAMUSCULAR | Status: DC
  Administered 2016-08-10: 1150 [IU]/h via INTRAVENOUS
  Filled 2016-08-10 (×2): qty 250

## 2016-08-10 MED ORDER — TAMSULOSIN HCL 0.4 MG PO CAPS
0.8000 mg | ORAL_CAPSULE | Freq: Every day | ORAL | Status: DC
  Administered 2016-08-10 – 2016-08-13 (×4): 0.8 mg via ORAL
  Filled 2016-08-10 (×4): qty 2

## 2016-08-10 MED ORDER — ASPIRIN 81 MG PO CHEW
81.0000 mg | CHEWABLE_TABLET | ORAL | Status: AC
  Administered 2016-08-11: 81 mg via ORAL
  Filled 2016-08-10: qty 1

## 2016-08-10 MED ORDER — SODIUM CHLORIDE 0.9% FLUSH: 3.0000 mL | Freq: Two times a day (BID) | INTRAVENOUS | Status: DC

## 2016-08-10 MED ORDER — ATORVASTATIN CALCIUM 80 MG PO TABS
80.0000 mg | ORAL_TABLET | Freq: Every day | ORAL | Status: DC
  Administered 2016-08-11 – 2016-08-13 (×3): 80 mg via ORAL
  Filled 2016-08-10 (×3): qty 1

## 2016-08-10 MED ORDER — ASPIRIN 81 MG PO CHEW: 81.0000 mg | CHEWABLE_TABLET | Freq: Every day | ORAL | Status: DC

## 2016-08-10 MED ORDER — METOPROLOL TARTRATE 12.5 MG HALF TABLET
12.5000 mg | ORAL_TABLET | Freq: Two times a day (BID) | ORAL | Status: DC
  Administered 2016-08-10 – 2016-08-13 (×8): 12.5 mg via ORAL
  Filled 2016-08-10 (×8): qty 1

## 2016-08-10 MED ORDER — ONDANSETRON HCL 4 MG/2ML IJ SOLN: 4.0000 mg | Freq: Four times a day (QID) | INTRAMUSCULAR | Status: DC | PRN

## 2016-08-10 MED ORDER — NITROGLYCERIN IN D5W 200-5 MCG/ML-% IV SOLN
0.0000 ug/min | INTRAVENOUS | Status: DC
  Administered 2016-08-10: 5 ug/min via INTRAVENOUS
  Administered 2016-08-12: 10 ug/min via INTRAVENOUS
  Filled 2016-08-10: qty 250

## 2016-08-10 MED ORDER — SODIUM CHLORIDE 0.9 % WEIGHT BASED INFUSION
3.0000 mL/kg/h | INTRAVENOUS | Status: DC
  Administered 2016-08-11: 3 mL/kg/h via INTRAVENOUS

## 2016-08-10 MED ORDER — PSYLLIUM 95 % PO PACK
1.0000 | PACK | Freq: Two times a day (BID) | ORAL | Status: DC
  Administered 2016-08-10 – 2016-08-13 (×6): 1 via ORAL
  Filled 2016-08-10 (×6): qty 1

## 2016-08-10 MED ORDER — ALPRAZOLAM 0.25 MG PO TABS
0.2500 mg | ORAL_TABLET | Freq: Every evening | ORAL | Status: DC | PRN
  Administered 2016-08-10 – 2016-08-12 (×3): 0.25 mg via ORAL
  Filled 2016-08-10 (×3): qty 1

## 2016-08-10 MED ORDER — NITROGLYCERIN 0.4 MG SL SUBL: 0.4000 mg | SUBLINGUAL_TABLET | SUBLINGUAL | Status: DC | PRN

## 2016-08-10 MED ORDER — MORPHINE SULFATE (PF) 4 MG/ML IV SOLN: 4.0000 mg | Freq: Once | INTRAVENOUS | Status: DC

## 2016-08-10 MED ORDER — SODIUM CHLORIDE 0.9 % WEIGHT BASED INFUSION
1.0000 mL/kg/h | INTRAVENOUS | Status: DC
  Administered 2016-08-11: 1 mL/kg/h via INTRAVENOUS

## 2016-08-10 MED ORDER — ASPIRIN EC 81 MG PO TBEC: 81.0000 mg | DELAYED_RELEASE_TABLET | Freq: Every day | ORAL | Status: DC

## 2016-08-10 MED ORDER — SODIUM CHLORIDE 0.9 % IV SOLN: 250.0000 mL | INTRAVENOUS | Status: DC | PRN

## 2016-08-10 MED ORDER — ATORVASTATIN CALCIUM 80 MG PO TABS
80.0000 mg | ORAL_TABLET | ORAL | Status: AC
  Administered 2016-08-10: 80 mg via ORAL
  Filled 2016-08-10: qty 1

## 2016-08-10 MED ORDER — SODIUM CHLORIDE 0.9% FLUSH: 3.0000 mL | INTRAVENOUS | Status: DC | PRN

## 2016-08-10 MED ORDER — HEPARIN BOLUS VIA INFUSION
4000.0000 [IU] | Freq: Once | INTRAVENOUS | Status: AC
  Administered 2016-08-10: 4000 [IU] via INTRAVENOUS
  Filled 2016-08-10: qty 4000

## 2016-08-10 NOTE — Progress Notes (Signed)
Marc Gilbert is scheduled for LHC without LV gram tomorrow at 1500 with Dr. Tamala Julian. NPO and IV hydration starting at MN tonight.   Ledora Bottcher, PA-C 08/10/2016, 12:31 PM [phone] [facility]

## 2016-08-10 NOTE — Progress Notes (Signed)
ANTICOAGULATION CONSULT NOTE - Initial Consult  Pharmacy Consult for heparin Indication: chest pain/ACS  No Known Allergies  Patient Measurements: Height: 5\' 11"  (180.3 cm) Weight: 202 lb 12.8 oz (92 kg) IBW/kg (Calculated) : 75.3 Heparin Dosing Weight: 89.4 kg  Vital Signs: Temp: 97.9 F (36.6 C) (06/25 2137) Temp Source: Oral (06/25 2137) BP: 94/52 (06/25 2137) Pulse Rate: 67 (06/25 2137)  Labs:  Recent Labs  08/10/16 0841 08/10/16 2100  HGB 13.6  --   HCT 40.6  --   PLT 180  --   HEPARINUNFRC  --  0.43  CREATININE 1.32*  --     Estimated Creatinine Clearance: 48.3 mL/min (A) (by C-G formula based on SCr of 1.32 mg/dL (H)).   Medical History: Past Medical History:  Diagnosis Date  . Arthritis    "right hand; back" (08/30/2015)  . Cancer (HCC)    skin  squamous and basal  . Chronic lower back pain   . CKD (chronic kidney disease), stage III   . COPD (chronic obstructive pulmonary disease) (HCC)    no home O2  . Dysplastic colon polyp age 27   carcinoma in situ  . GERD (gastroesophageal reflux disease)    occ  . H/O hiatal hernia   . Hyperlipidemia   . Hypertension   . Prostate cancer ([place]) 07/2014   active surveillance, Glisson 6     Assessment: 83 yo male admitted with chest pain Received asa in EMS Starting heparin gtt Planning to take for cardiac cath tomorrow  H/h and plts wnl No anticoagulation prior to admission  2200 heparin level therapeutic: 0.43 - no bleeding or infusion related issues documented  Goal of Therapy:  Heparin level 0.3-0.7 units/ml Monitor platelets by anticoagulation protocol: Yes   Plan:  -Continue heparin gtt at 1150 units/hr  -Daily HL, CBC -Level this evening   Marc Gilbert Marc Gilbert 08/10/2016,10:26 PM

## 2016-08-10 NOTE — Progress Notes (Signed)
ANTICOAGULATION CONSULT NOTE - Initial Consult  Pharmacy Consult for heparin Indication: chest pain/ACS  No Known Allergies  Patient Measurements: Height: 5\' 11"  (180.3 cm) Weight: 197 lb (89.4 kg) IBW/kg (Calculated) : 75.3 Heparin Dosing Weight: 89.4 kg  Vital Signs: Temp: 97.9 F (36.6 C) (06/25 0920) Temp Source: Oral (06/25 0920) BP: 119/74 (06/25 1130) Pulse Rate: 77 (06/25 1215)  Labs:  Recent Labs  08/10/16 0841  HGB 13.6  HCT 40.6  PLT 180  CREATININE 1.32*    Estimated Creatinine Clearance: 44.4 mL/min (A) (by C-G formula based on SCr of 1.32 mg/dL (H)).   Medical History: Past Medical History:  Diagnosis Date  . Arthritis    "right hand; back" (08/30/2015)  . Cancer (HCC)    skin  squamous and basal  . Chronic lower back pain   . CKD (chronic kidney disease), stage III   . COPD (chronic obstructive pulmonary disease) (HCC)    no home O2  . Dysplastic colon polyp age 60   carcinoma in situ  . GERD (gastroesophageal reflux disease)    occ  . H/O hiatal hernia   . Hyperlipidemia   . Hypertension   . Prostate cancer ([place]) 07/2014   active surveillance, [place]isson 6     Assessment: 83 yo male admitted with chest pain Received asa in EMS Starting heparin gtt Planning to take for cardiac cath tomorrow  H/h and plts wnl No anticoagulation prior to admission   Goal of Therapy:  Heparin level 0.3-0.7 units/ml Monitor platelets by anticoagulation protocol: Yes   Plan:  -Heparin bolus 4000 units/hr then 1150 units/hr  -Daily HL, CBC -Level this evening   Harvel Quale 08/10/2016,12:37 PM

## 2016-08-10 NOTE — ED Provider Notes (Signed)
[facility] DEPT Provider Note   CSN: [id number] Arrival date & time: 08/10/16  7829     History   Chief Complaint Chief Complaint  Patient presents with  . Chest Pain    HPI Marc Gilbert is a 83 y.o. male.  HPI  2-4PM yesterday doing some work and had pain develop in chest center of chest, dull pain, pressure. Upper arm and under bicep had ache.  Sat down in recliner and it improved, then stood up to do some work again and it worsened.  Pain in chest and shortness of breath with aching in arm.    6:45 this AM pain came back again, was more intense 8/10, with shortness of breath and restlessness.  Nitroglycerin reduced the pain and is .  Took 4 baby aspirin, tylenol, blood pressure medication.  Frequency increasing, and intensity increasing. Is supposed to be going to [place] today, but does not think symptoms are anxiety.  Past Medical History:  Diagnosis Date  . Arthritis    "right hand; back" (08/30/2015)  . Cancer (HCC)    skin  squamous and basal  . Chronic lower back pain   . CKD (chronic kidney disease), stage III   . COPD (chronic obstructive pulmonary disease) (HCC)    no home O2  . Dysplastic colon polyp age 18   carcinoma in situ  . GERD (gastroesophageal reflux disease)    occ  . H/O hiatal hernia   . Hyperlipidemia   . Hypertension   . Prostate cancer ([place]) 07/2014   active surveillance, [facility] 6    Patient Active Problem List   Diagnosis Date Noted  . Pain in the chest   . Chest pain 08/30/2015  . CKD (chronic kidney disease) stage 3, GFR 30-59 ml/min 08/30/2015  . Hyperlipidemia 08/30/2015  . Constipation due to pain medication 05/28/2011  . Postoperative anemia due to acute blood loss 05/28/2011  . Osteoarthritis of hip 05/01/2011  . Hypertension 05/01/2011  . COPD (chronic obstructive pulmonary disease) ([place]) 05/01/2011  . Cancer of skin, face 05/01/2011    Past Surgical History:  Procedure Laterality Date  . CATARACT EXTRACTION W/  INTRAOCULAR LENS IMPLANT Left 08/27/15  . JOINT REPLACEMENT    . MOHS SURGERY     multiple SCC  . PROSTATE BIOPSY  07/2014  . TONSILLECTOMY    . TOTAL HIP ARTHROPLASTY  05/26/2011   Procedure: TOTAL HIP ARTHROPLASTY;  Surgeon: Garald Balding, MD;  Location: [facility];  Service: Orthopedics;  Laterality: Right;       Home Medications    Prior to Admission medications   Medication Sig Start Date End Date Taking? Authorizing Provider  acetaminophen (TYLENOL) 500 MG tablet Take 500-1,000 mg by mouth every 6 (six) hours as needed for mild pain or moderate pain.   Yes [provider]  aspirin 81 MG chewable tablet Chew 81 mg by mouth daily.    Yes [provider]  losartan (COZAAR) 25 MG tablet Take 25 mg by mouth every morning. 07/30/15  Yes [provider]  Multiple Vitamins-Minerals (ONE-A-DAY MENS 50+ ADVANTAGE) TABS Take 1 tablet by mouth daily with breakfast.   Yes [provider]  Omega-3 Fatty Acids (FISH OIL) 600 MG CAPS Take 1,800 mg by mouth every morning.    Yes [provider]  pravastatin (PRAVACHOL) 40 MG tablet Take 40 mg by mouth every evening.    Yes [provider]  psyllium (METAMUCIL) 58.6 % packet Take 1 packet by mouth 2 (  two) times daily.    Yes [provider]  Tamsulosin HCl (FLOMAX) 0.4 MG CAPS Take 0.8 mg by mouth daily.    Yes [provider]    Family History Family History  Problem Relation Age of Onset  . Cancer Mother        breast  . Diabetes Father     Social History Social History  Substance Use Topics  . Smoking status: Former Smoker    Packs/day: 0.50    Years: 25.00    Types: Cigarettes    Quit date: 05/24/1988  . Smokeless tobacco: Never Used  . Alcohol use 6.0 oz/week    5 Glasses of wine, 5 Cans of beer per week     Allergies   Patient has no known allergies.   Review of Systems Review of Systems  Constitutional: Negative for fever.  HENT: Negative for sore  throat.   Eyes: Negative for visual disturbance.  Respiratory: Positive for cough (chronic COPD) and shortness of breath.   Cardiovascular: Positive for chest pain. Negative for leg swelling.  Gastrointestinal: Negative for abdominal pain, nausea and vomiting.  Genitourinary: Negative for difficulty urinating.  Musculoskeletal: Positive for back pain. Negative for neck stiffness.  Skin: Negative for rash.  Neurological: Negative for syncope, light-headedness and headaches.     Physical Exam Updated Vital Signs BP 106/75   Pulse 75   Temp 97.9 F (36.6 C) (Oral)   Resp 19   Ht 5\' 11"  (1.803 m)   Wt 89.4 kg (197 lb)   SpO2 93%   BMI 27.48 kg/m   Physical Exam  Constitutional: He is oriented to person, place, and time. He appears well-developed and well-nourished. No distress.  HENT:  Head: Normocephalic and atraumatic.  Eyes: Conjunctivae and EOM are normal.  Neck: Normal range of motion.  Cardiovascular: Normal rate, regular rhythm, normal heart sounds and intact distal pulses.  Exam reveals no gallop and no friction rub.   No murmur heard. Pulmonary/Chest: Effort normal and breath sounds normal. No respiratory distress. He has no wheezes. He has no rales.  Abdominal: Soft. He exhibits no distension. There is no tenderness. There is no guarding.  Musculoskeletal: He exhibits no edema.  Neurological: He is alert and oriented to person, place, and time.  Skin: Skin is warm and dry. He is not diaphoretic.  Nursing note and vitals reviewed.    ED Treatments / Results  Labs (all labs ordered are listed, but only abnormal results are displayed) Labs Reviewed  BASIC METABOLIC PANEL - Abnormal; Notable for the following:       Result Value   Glucose, Bld 121 (*)    BUN 22 (*)    Creatinine, Ser 1.32 (*)    Calcium 8.7 (*)    GFR calc non Af Amer 48 (*)    GFR calc Af Amer 55 (*)    All other components within normal limits  CBC  I-STAT TROPOININ, ED    EKG  EKG  Interpretation  Date/Time:  Monday August 10 2016 08:17:51 EDT Ventricular Rate:  75 PR Interval:    QRS Duration: 129 QT Interval:  380 QTC Calculation: 425 R Axis:   -47 Text Interpretation:  Sinus rhythm Nonspecific IVCD with LAD Consider anterior infarct No significant change since last tracing Confirmed by Gareth Morgan [id number]) on 08/10/2016 9:00:40 AM       Radiology Dg Chest 2 View  Result Date: 08/10/2016 CLINICAL DATA:  83 year old with episodes of chest pain yesterday  and again this morning. Current history of hypertension, stage 3 chronic kidney disease, COPD. Former smoker who quit in 1990. EXAM: CHEST  2 VIEW COMPARISON:  11/29/2015, 08/30/2015, 06/16/2013 and CTA chest 08/30/2015. FINDINGS: Cardiac silhouette normal in size, unchanged. Thoracic aorta atherosclerotic, unchanged. Hilar and mediastinal contours otherwise unremarkable. Scarring involving the left lower lobe and lingula, unchanged. Lungs otherwise clear. Bronchovascular markings normal. Pulmonary vascularity normal. No pleural effusions. Degenerative changes at the thoracolumbar junction. IMPRESSION: No acute cardiopulmonary disease. Stable scarring involving the left lower lobe and lingula. Electronically Signed   By: Evangeline Dakin M.D.   On: 08/10/2016 09:00    Procedures Procedures (including critical care time)  Medications Ordered in ED Medications - No data to display   Initial Impression / Assessment and Plan / ED Course  I have reviewed the triage vital signs and the nursing notes.  Pertinent labs & imaging results that were available during my care of the patient were reviewed by me and considered in my medical decision making (see chart for details).     83 year old male with a history of COPD, hypertension, hyperlipidemia, prostate cancer interactive surveillance, CK D presents with concern for chest pain.  Have low suspicion for pulmonary embolus, aortic dissection by history, exam and  imaging. Chest x-ray without acute findings. Initial troponin negative. EKG without acute changes.  Symptoms described by patient are consistent with typical angina, with escalating frequency and intensity and chest pain with minimal exertion. Will consult cardiology for concern of possible unstable angina. Patient is currently chest pain-free after receiving nitroglycerin with EMS. He received 325mg  of aspirin at home. Heparin initiated by Cardiology team.  Final Clinical Impressions(s) / ED Diagnoses   Final diagnoses:  Chest pain with high risk for cardiac etiology    New Prescriptions New Prescriptions   No medications on file     Gareth Morgan, MD 08/10/16 2151

## 2016-08-10 NOTE — ED Notes (Signed)
After patient walk to bathroom, and returned back to room he started having chest pain and shortness of breath, Rolene Arbour was informed.

## 2016-08-10 NOTE — ED Triage Notes (Signed)
Pt from home by [facility] for chest pain that started with excursion yesterday that has been intermittent with excursion since. Pt is currently chest pain free and received 1 nitro and 324mg  ASA with EMS

## 2016-08-10 NOTE — H&P (Signed)
History & Physical    Patient ID: Marc Gilbert MRN: [id number], DOB/AGE: February 22, 1930   Admit date: 08/10/2016   Primary Physician: Lajean Manes, MD Primary Cardiologist: new - Dr. Percival Spanish  Patient Profile    Marc Gilbert is a 83 yo male with a PMH significant for HTN, HLD, GERD, COPD, CKD stage III, and chronic back pain. He reported to the [facility] with new onset chest pain.  Past Medical History    Past Medical History:  Diagnosis Date  . Arthritis    "right hand; back" (08/30/2015)  . Cancer (HCC)    skin  squamous and basal  . Chronic lower back pain   . CKD (chronic kidney disease), stage III   . COPD (chronic obstructive pulmonary disease) (HCC)    no home O2  . Dysplastic colon polyp age 53   carcinoma in situ  . GERD (gastroesophageal reflux disease)    occ  . H/O hiatal hernia   . Hyperlipidemia   . Hypertension   . Prostate cancer ([place]) 07/2014   active surveillance, [place] 6    Past Surgical History:  Procedure Laterality Date  . CATARACT EXTRACTION W/ INTRAOCULAR LENS IMPLANT Left 08/27/15  . JOINT REPLACEMENT    . MOHS SURGERY     multiple SCC  . PROSTATE BIOPSY  07/2014  . TONSILLECTOMY    . TOTAL HIP ARTHROPLASTY  05/26/2011   Procedure: TOTAL HIP ARTHROPLASTY;  Surgeon: Garald Balding, MD;  Location: [facility];  Service: Orthopedics;  Laterality: Right;     Allergies  No Known Allergies  History of Present Illness    [name] was seen in the ED for chest pain on 11/30/15 and 08/31/15. On 08/31/15, he underwent a myoview stress test that was a low risk study. He was discharged without further intervention. On 11/30/15, he again presented with chest pain and was ruled out with negative troponin and negative EKG.   On my interview, [name] describes 3 episodes of chest pain on exertion yesterday. He was packing for an international trip when he began to have centrally located substernal chest pain that radiated down his left arm. The pain was rated as a  8/10 and described as a pressure like someone was sitting on his chest. This chest pain was associated with shortness of breath and diaphoresis. He sat down to rest, which relieved the pain. After about 10 min, he returned to packing and the chest pain returned, again relieved with rest. Last evening, the chest pain recurred with minimal activity and was relieved with rest. A fourth bout of this exertional chest pain this morning that was worse with worsening SOB  prompted the family to call EMS. Upon arrival, he received SL nitro x 1 and 324 mg ASA. His chest pain is now completely relieved.  He is active at [facility] and completes exercise classes regularly. His last exercise class was on Sat 08/08/16 and he had no exertional chest pain at that time. He denies N/V, fever, chills, dizziness, lightheadedness, and feelings of syncope. He states these episodes were different than her 2 prior chest pain episodes that prompted a visit to the ED. He states these episodes are worse, associated with SOB and diaphoresis, and pain radiated down his left arm.  Of note, his O2 sats have been dropping in the upper 80s on room air. CTA in 08/2015 was negative for PE after a positive D-dimer. CTA also showed atherosclerotic disease in his coronary arteries. He denies recent  travel and recent illness.  Home Medications    Prior to Admission medications   Medication Sig Start Date End Date Taking? Authorizing Provider  acetaminophen (TYLENOL) 500 MG tablet Take 500-1,000 mg by mouth every 6 (six) hours as needed for mild pain or moderate pain.    [provider]  aspirin 81 MG chewable tablet Chew 81 mg by mouth daily.     [provider]  losartan (COZAAR) 25 MG tablet Take 25 mg by mouth every morning. 07/30/15   [provider]  Multiple Vitamins-Minerals (ONE-A-DAY MENS 50+ ADVANTAGE) TABS Take 1 tablet by mouth daily with breakfast.    [provider]  Omega-3 Fatty Acids (FISH OIL)  600 MG CAPS Take 1,800 mg by mouth every morning.     [provider]  pravastatin (PRAVACHOL) 40 MG tablet Take 40 mg by mouth every evening.     [provider]  psyllium (METAMUCIL) 58.6 % packet Take 1 packet by mouth 3 (three) times daily.     [provider]  Tamsulosin HCl (FLOMAX) 0.4 MG CAPS Take 0.4 mg by mouth daily.    [provider]    Family History    Family History  Problem Relation Age of Onset  . Cancer Mother        breast  . Diabetes Father     Social History    Social History   Social History  . Marital status: Married    Spouse name: N/A  . Number of children: N/A  . Years of education: N/A   Occupational History  . retired 1996    Social History Main Topics  . Smoking status: Former Smoker    Packs/day: 0.50    Years: 25.00    Types: Cigarettes    Quit date: 05/24/1988  . Smokeless tobacco: Never Used  . Alcohol use 6.0 oz/week    5 Glasses of wine, 5 Cans of beer per week  . Drug use: No  . Sexual activity: Not on file   Other Topics Concern  . Not on file   Social History Narrative  . No narrative on file     Review of Systems    General:  No chills, fever, night sweats or weight changes.  Cardiovascular:  No chest pain, dyspnea on exertion, edema, orthopnea, palpitations, paroxysmal nocturnal dyspnea. Dermatological: No rash, lesions/masses Respiratory: No cough, dyspnea Urologic: No hematuria, dysuria Abdominal:   No nausea, vomiting, diarrhea, bright red blood per rectum, melena, or hematemesis Neurologic:  No visual changes, changes in mental status. All other systems reviewed and are otherwise negative except as noted above.  Physical Exam    Blood pressure 105/71, pulse 74, temperature 97.9 F (36.6 C), temperature source Oral, resp. rate 14, height 5\' 11"  (1.803 m), weight 197 lb (89.4 kg), SpO2 92 %.  General: Pleasant, NAD Psych: Normal affect. Neuro: Alert and oriented X 3. Moves all  extremities spontaneously. HEENT: Normal  Neck: Supple without bruits or JVD. Lungs:  Resp regular and unlabored, CTA. Heart: RRR no s3, s4, or murmurs. Abdomen: Soft, non-tender, non-distended, BS + x 4.  Extremities: No clubbing, cyanosis or edema. DP/PT/Radials 2+ and equal bilaterally.  Labs    Troponin [facility] Test)  Recent Labs  08/10/16 0839  TROPIPOC 0.03   No results for input(s): CKTOTAL, CKMB, TROPONINI in the last 72 hours. Lab Results  Component Value Date   WBC 5.3 08/10/2016   HGB 13.6 08/10/2016   HCT  40.6 08/10/2016   MCV 92.5 08/10/2016   PLT 180 08/10/2016    Recent Labs Lab 08/10/16 0841  NA 136  K 4.4  CL 105  CO2 23  BUN 22*  CREATININE 1.32*  CALCIUM 8.7*  GLUCOSE 121*   No results found for: CHOL, HDL, LDLCALC, TRIG Lab Results  Component Value Date   DDIMER 1.39 (H) 08/30/2015     Radiology Studies    Dg Chest 2 View  Result Date: 08/10/2016 CLINICAL DATA:  83 year old with episodes of chest pain yesterday and again this morning. Current history of hypertension, stage 3 chronic kidney disease, COPD. Former smoker who quit in 1990. EXAM: CHEST  2 VIEW COMPARISON:  11/29/2015, 08/30/2015, 06/16/2013 and CTA chest 08/30/2015. FINDINGS: Cardiac silhouette normal in size, unchanged. Thoracic aorta atherosclerotic, unchanged. Hilar and mediastinal contours otherwise unremarkable. Scarring involving the left lower lobe and lingula, unchanged. Lungs otherwise clear. Bronchovascular markings normal. Pulmonary vascularity normal. No pleural effusions. Degenerative changes at the thoracolumbar junction. IMPRESSION: No acute cardiopulmonary disease. Stable scarring involving the left lower lobe and lingula. Electronically Signed   By: Evangeline Dakin M.D.   On: 08/10/2016 09:00    ECG & Cardiac Imaging    EKG 08/10/16: sinus rhythm, poor R wave progression, can't rule out anterior infarct  Echocardiogram 08/31/15: Study Conclusions - Left  ventricle: The cavity size was normal. There was mild   concentric hypertrophy. Systolic function was mildly reduced. The   estimated ejection fraction was in the range of 45% to 50%. Wall   motion was normal; there were no regional wall motion   abnormalities. Doppler parameters are consistent with abnormal   left ventricular relaxation (grade 1 diastolic dysfunction). - Aortic root: The aortic root was trivially dilated. - Right ventricle: The cavity size was mildly dilated. Wall   thickness was normal. - Right atrium: The atrium was mildly dilated.   Myoview 08/31/15: 1. No pharmacologically induced. Myocardial reversibility identified. 2. Moderate lateral wall hypokinesis and mild inferior wall hypokinesis. 3. Left ventricular ejection fraction 47% 4. Non invasive risk stratification*: Low Low risk test with moderate hypokinesis. Pending echo to assess further.    Assessment & Plan    1. Chest pain - troponin x 1 negative (0.03) - EKG with sinus rhythm and poor R wave progression, can't rule out anterior infarct - this is not a new finding compared to old EKGs The patient describes typical anginal pains in the setting of an initial negative troponin and unchanged EKG. CTA last year with atherosclerotic disease in coronary arteries. Risk factors for CAD include HTN, HLD, and former smoker. This is his third visit to [facility] for chest pain in less than one year. Will proceed with further ischemic workup. Plan for heart catheterization tomorrow.   2. CKD stage III - sCr 1.32, baseline appears to be 1.18-1.36 - start gentle hydration as he has been NPO   3. HTN - home losartan on hold for kidney function in the setting of contrast   4. HLD - continue pravastatin   5. COPD - O2 sats dropping into the upper 80s, may be due to sensor positioning. Will continue to monitor - he does not require home O2 or inhalers   6. Mild systolic dysfunction, grade 1 diastolic dysfunction -  EF 45-50% - patient is not overloaded on exam and has no clinical signs of worsening CHF   Signed, Ledora Bottcher, PA-C 08/10/2016, 9:47 AM   History and all data above reviewed.  Patient examined.  I agree with the findings as above.  The patient presents for evaluation of chest pain.  His pain is mid sternal and has started the last couple of days with exertion and getting worse over the last two days.  He is reports radiation to the left elbow.  It was 8/10.  This was slightly different than previous.  The patient exam reveals COR:RRR  ,  Lungs: Clear  ,  Abd: Positive bowel sounds, no rebound no guarding, Ext No edema  .  All available labs, radiology testing, previous records reviewed. Agree with documented assessment and plan. Chest pain:  This is consistent with unstable angina.  Cardiac cath is indicated.  The patient understands that risks included but are not limited to stroke (1 in 1000), death (1 in 40), kidney failure [usually temporary] (1 in 500), bleeding (1 in 200), allergic reaction [possibly serious] (1 in 200).  The patient understands and agrees to proceed.   Needs to be hydrated overnight with limited dye.  I will hold AM cozaar.  No LV gram.  Repeat echo.  CKD:  Hydrate and limit dye.  Dyslipidemia:  Check lipids and change to lipitor.    Jeneen Rinks Zaydon Kinser  11:49 AM  08/10/2016

## 2016-08-11 ENCOUNTER — Inpatient Hospital Stay (HOSPITAL_COMMUNITY): Payer: Medicare Other

## 2016-08-11 ENCOUNTER — Encounter (HOSPITAL_COMMUNITY)
Admission: EM | Disposition: A | Discharge status: Home Health | Payer: Self-pay | Source: Home / Self Care | Attending: Surgery

## 2016-08-11 ENCOUNTER — Encounter (HOSPITAL_COMMUNITY): Payer: Self-pay | Admitting: Interventional Cardiology

## 2016-08-11 DIAGNOSIS — I25110 Atherosclerotic heart disease of native coronary artery with unstable angina pectoris: Secondary | ICD-10-CM

## 2016-08-11 DIAGNOSIS — I10 Essential (primary) hypertension: Secondary | ICD-10-CM

## 2016-08-11 DIAGNOSIS — E784 Other hyperlipidemia: Secondary | ICD-10-CM

## 2016-08-11 DIAGNOSIS — I200 Unstable angina: Secondary | ICD-10-CM

## 2016-08-11 DIAGNOSIS — R072 Precordial pain: Secondary | ICD-10-CM

## 2016-08-11 HISTORY — PX: LEFT HEART CATH AND CORONARY ANGIOGRAPHY: CATH118249

## 2016-08-11 HISTORY — PX: ULTRASOUND GUIDANCE FOR VASCULAR ACCESS: SHX6516

## 2016-08-11 LAB — HEMOGLOBIN A1C
Hgb A1c MFr Bld: 5.7 % — ABNORMAL HIGH (ref 4.8–5.6)
Mean Plasma Glucose: 117 mg/dL

## 2016-08-11 LAB — ECHOCARDIOGRAM COMPLETE
Ao-asc: 34 cm
E decel time: 299 msec
E/e' ratio: 6.78
FS: 31 % (ref 28–44)
Height: 71 in
IVS/LV PW RATIO, ED: 1.39
LA ID, A-P, ES: 35 mm
LA diam end sys: 35 mm
LA diam index: 1.62 cm/m2
LA vol A4C: 32.2 ml
LA vol index: 18.1 mL/m2
LA vol: 39.2 mL
LV E/e' medial: 6.78
LV E/e'average: 6.78
LV PW d: 7.96 mm — AB (ref 0.6–1.1)
LV e' LATERAL: 5.09 cm/s
LVOT area: 2.84 cm2
LVOT diameter: 19 mm
Lateral S' vel: 5.33 cm/s
MV Annulus VTI: 18.4 cm
MV Dec: 299
MV M vel: 40.9
MV pk A vel: 68.7 m/s
MV pk E vel: 34.5 m/s
Mean grad: 1 mmHg
RV sys press: 32 mmHg
Reg peak vel: 270 cm/s
TAPSE: 12.4 mm
TDI e' lateral: 5.09
TDI e' medial: 5.15
TR max vel: 270 cm/s
Weight: 3257.6 oz

## 2016-08-11 LAB — CBC
HCT: 39.3 % (ref 39.0–52.0)
Hemoglobin: 12.9 g/dL — ABNORMAL LOW (ref 13.0–17.0)
MCH: 30.6 pg (ref 26.0–34.0)
MCHC: 32.8 g/dL (ref 30.0–36.0)
MCV: 93.1 fL (ref 78.0–100.0)
Platelets: 172 10*3/uL (ref 150–400)
RBC: 4.22 MIL/uL (ref 4.22–5.81)
RDW: 12.9 % (ref 11.5–15.5)
WBC: 6 10*3/uL (ref 4.0–10.5)

## 2016-08-11 LAB — BASIC METABOLIC PANEL
Anion gap: 7 (ref 5–15)
BUN: 21 mg/dL — ABNORMAL HIGH (ref 6–20)
CO2: 23 mmol/L (ref 22–32)
Calcium: 8.5 mg/dL — ABNORMAL LOW (ref 8.9–10.3)
Chloride: 108 mmol/L (ref 101–111)
Creatinine, Ser: 1.2 mg/dL (ref 0.61–1.24)
GFR calc Af Amer: >60 mL/min (ref >60)
GFR calc non Af Amer: 54 mL/min — ABNORMAL LOW (ref >60)
Glucose, Bld: 111 mg/dL — ABNORMAL HIGH (ref 65–99)
Potassium: 4.2 mmol/L (ref 3.5–5.1)
Sodium: 138 mmol/L (ref 135–145)

## 2016-08-11 LAB — HEPARIN LEVEL (UNFRACTIONATED): Heparin Unfractionated: 0.47 IU/mL (ref 0.30–0.70)

## 2016-08-11 LAB — TROPONIN I
Troponin I: 0.07 ng/mL (ref <0.03)
Troponin I: 0.06 ng/mL (ref <0.03)
Troponin I: 0.04 ng/mL (ref <0.03)

## 2016-08-11 LAB — PROTIME-INR
INR: 1.04
Prothrombin Time: 13.6 seconds (ref 11.4–15.2)

## 2016-08-11 SURGERY — LEFT HEART CATH AND CORONARY ANGIOGRAPHY
Anesthesia: LOCAL

## 2016-08-11 MED ORDER — FENTANYL CITRATE (PF) 100 MCG/2ML IJ SOLN
INTRAMUSCULAR | Status: DC | PRN
  Administered 2016-08-11: 50 ug via INTRAVENOUS

## 2016-08-11 MED ORDER — HEPARIN SODIUM (PORCINE) 1000 UNIT/ML IJ SOLN
INTRAMUSCULAR | Status: DC | PRN
  Administered 2016-08-11: 5000 [IU] via INTRAVENOUS

## 2016-08-11 MED ORDER — HEPARIN (PORCINE) IN NACL 100-0.45 UNIT/ML-% IJ SOLN
1100.0000 [IU]/h | INTRAMUSCULAR | Status: DC
  Administered 2016-08-11: 1250 [IU]/h via INTRAVENOUS
  Administered 2016-08-12: 1350 [IU]/h via INTRAVENOUS
  Administered 2016-08-13: 1250 [IU]/h via INTRAVENOUS
  Filled 2016-08-11 (×2): qty 250

## 2016-08-11 MED ORDER — MIDAZOLAM HCL 2 MG/2ML IJ SOLN
INTRAMUSCULAR | Status: AC
  Filled 2016-08-11: qty 2

## 2016-08-11 MED ORDER — SODIUM CHLORIDE 0.9% FLUSH: 3.0000 mL | INTRAVENOUS | Status: DC | PRN

## 2016-08-11 MED ORDER — ASPIRIN 81 MG PO CHEW
81.0000 mg | CHEWABLE_TABLET | Freq: Every day | ORAL | Status: DC
  Administered 2016-08-12 – 2016-08-13 (×2): 81 mg via ORAL
  Filled 2016-08-11 (×2): qty 1

## 2016-08-11 MED ORDER — HEPARIN (PORCINE) IN NACL 2-0.9 UNIT/ML-% IJ SOLN
INTRAMUSCULAR | Status: AC | PRN
  Administered 2016-08-11: 1000 mL

## 2016-08-11 MED ORDER — SODIUM CHLORIDE 0.9 % IV SOLN
250.0000 mL | INTRAVENOUS | Status: DC | PRN
  Administered 2016-08-14: 16:00:00 via INTRAVENOUS

## 2016-08-11 MED ORDER — ACETAMINOPHEN 325 MG PO TABS: 650.0000 mg | ORAL_TABLET | ORAL | Status: DC | PRN

## 2016-08-11 MED ORDER — MIDAZOLAM HCL 2 MG/2ML IJ SOLN
INTRAMUSCULAR | Status: DC | PRN
  Administered 2016-08-11: 1 mg via INTRAVENOUS

## 2016-08-11 MED ORDER — HEPARIN SODIUM (PORCINE) 1000 UNIT/ML IJ SOLN
INTRAMUSCULAR | Status: AC
  Filled 2016-08-11: qty 1

## 2016-08-11 MED ORDER — SODIUM CHLORIDE 0.9 % IV SOLN
INTRAVENOUS | Status: AC
  Administered 2016-08-11: 11:00:00 via INTRAVENOUS

## 2016-08-11 MED ORDER — HEPARIN (PORCINE) IN NACL 2-0.9 UNIT/ML-% IJ SOLN
INTRAMUSCULAR | Status: DC | PRN
  Administered 2016-08-11: 10 mL via INTRA_ARTERIAL

## 2016-08-11 MED ORDER — ASPIRIN 81 MG PO CHEW: 81.0000 mg | CHEWABLE_TABLET | Freq: Every day | ORAL | Status: DC

## 2016-08-11 MED ORDER — IOPAMIDOL (ISOVUE-370) INJECTION 76%
INTRAVENOUS | Status: DC | PRN
  Administered 2016-08-11: 100 mL via INTRAVENOUS

## 2016-08-11 MED ORDER — FENTANYL CITRATE (PF) 100 MCG/2ML IJ SOLN
INTRAMUSCULAR | Status: AC
  Filled 2016-08-11: qty 2

## 2016-08-11 MED ORDER — OXYCODONE-ACETAMINOPHEN 5-325 MG PO TABS: 1.0000 | ORAL_TABLET | ORAL | Status: DC | PRN

## 2016-08-11 MED ORDER — VERAPAMIL HCL 2.5 MG/ML IV SOLN
INTRAVENOUS | Status: AC
  Filled 2016-08-11: qty 2

## 2016-08-11 MED ORDER — PERFLUTREN LIPID MICROSPHERE
INTRAVENOUS | Status: AC
Start: 2016-08-11 — End: 2016-08-11
  Administered 2016-08-11: 14:00:00
  Filled 2016-08-11: qty 10

## 2016-08-11 MED ORDER — ONDANSETRON HCL 4 MG/2ML IJ SOLN: 4.0000 mg | Freq: Four times a day (QID) | INTRAMUSCULAR | Status: DC | PRN

## 2016-08-11 MED ORDER — HEPARIN (PORCINE) IN NACL 2-0.9 UNIT/ML-% IJ SOLN
INTRAMUSCULAR | Status: AC
  Filled 2016-08-11: qty 1000

## 2016-08-11 MED ORDER — LIDOCAINE HCL (PF) 1 % IJ SOLN
INTRAMUSCULAR | Status: DC | PRN
  Administered 2016-08-11: 1 mL

## 2016-08-11 MED ORDER — LIDOCAINE HCL 1 % IJ SOLN
INTRAMUSCULAR | Status: AC
  Filled 2016-08-11: qty 20

## 2016-08-11 MED ORDER — SODIUM CHLORIDE 0.9% FLUSH
3.0000 mL | Freq: Two times a day (BID) | INTRAVENOUS | Status: DC
  Administered 2016-08-11 – 2016-08-13 (×5): 3 mL via INTRAVENOUS

## 2016-08-11 SURGICAL SUPPLY — 11 items
CATH EXPO 5F FL3.5 (CATHETERS) ×3 IMPLANT
CATH EXPO 5FR FR4 (CATHETERS) ×3 IMPLANT
DEVICE RAD COMP TR BAND LRG (VASCULAR PRODUCTS) ×3 IMPLANT
GLIDESHEATH SLEND A-KIT 6F 22G (SHEATH) ×3 IMPLANT
GUIDEWIRE INQWIRE 1.5J.035X260 (WIRE) ×2 IMPLANT
INQWIRE 1.5J .035X260CM (WIRE) ×3
KIT HEART LEFT (KITS) ×3 IMPLANT
PACK CARDIAC CATHETERIZATION (CUSTOM PROCEDURE TRAY) ×3 IMPLANT
SLEEVE REPOSITIONING LENGTH 30 (MISCELLANEOUS) ×3 IMPLANT
TRANSDUCER W/STOPCOCK (MISCELLANEOUS) ×3 IMPLANT
TUBING CIL FLEX 10 FLL-RA (TUBING) ×3 IMPLANT

## 2016-08-11 NOTE — Plan of Care (Signed)
Problem: Pain Managment: Goal: General experience of comfort will improve Outcome: Progressing Pt reports relief from chest pain; NTG gtt infusing.  Cardiac cath in am.

## 2016-08-11 NOTE — Progress Notes (Signed)
Progress Note  Patient Name: Marc Gilbert Date of Encounter: 08/11/2016  Primary Cardiologist:   (New) Dr. Percival Spanish  Subjective   No further chest pain.  No SOB.   Inpatient Medications    Scheduled Meds: . [START ON 08/12/2016] aspirin  81 mg Oral Daily  . atorvastatin  80 mg Oral q1800  . metoprolol tartrate  12.5 mg Oral BID  .  morphine injection  4 mg Intravenous Once  . psyllium  1 packet Oral BID  . sodium chloride flush  3 mL Intravenous Q12H  . tamsulosin  0.8 mg Oral Daily   Continuous Infusions: . sodium chloride 75 mL/hr at 08/11/16 1059  . sodium chloride    . heparin    . nitroGLYCERIN 5 mcg/min (08/10/16 1226)   PRN Meds: sodium chloride, acetaminophen, acetaminophen, ALPRAZolam, ondansetron (ZOFRAN) IV, ondansetron (ZOFRAN) IV, oxyCODONE-acetaminophen, sodium chloride flush   Vital Signs    Vitals:   08/11/16 1145 08/11/16 1200 08/11/16 1614 08/11/16 1653  BP: 118/75 123/79 129/64   Pulse: 73 73    Resp:      Temp:    97.4 F (36.3 C)  TempSrc:    Oral  SpO2: 97% 97%    Weight:      Height:        Intake/Output Summary (Last 24 hours) at 08/11/16 1708 Last data filed at 08/11/16 1500  Gross per 24 hour  Intake          2281.98 ml  Output             1825 ml  Net           456.98 ml   Filed Weights   08/10/16 0821 08/10/16 1342 08/11/16 0500  Weight: 197 lb (89.4 kg) 202 lb 12.8 oz (92 kg) 203 lb 9.6 oz (92.4 kg)    Telemetry    NSR - Personally Reviewed  ECG    NA - Personally Reviewed  Physical Exam   GEN: No acute distress.   Neck: No  JVD Cardiac: RRR, no murmurs, rubs, or gallops.  Respiratory: Clear  to auscultation bilaterally. GI: Soft, nontender, non-distended  MS: No  edema; No deformity. Neuro:  Nonfocal  Psych: Normal affect   Labs    Chemistry Recent Labs Lab 08/10/16 0841 08/11/16 0516  NA 136 138  K 4.4 4.2  CL 105 108  CO2 23 23  GLUCOSE 121* 111*  BUN 22* 21*  CREATININE 1.32* 1.20  CALCIUM  8.7* 8.5*  GFRNONAA 48* 54*  GFRAA 55* >60  ANIONGAP 8 7     Hematology Recent Labs Lab 08/10/16 0841 08/11/16 0516  WBC 5.3 6.0  RBC 4.39 4.22  HGB 13.6 12.9*  HCT 40.6 39.3  MCV 92.5 93.1  MCH 31.0 30.6  MCHC 33.5 32.8  RDW 12.7 12.9  PLT 180 172    Cardiac Enzymes Recent Labs Lab 08/11/16 0516 08/11/16 1033  TROPONINI 0.07* 0.06*    Recent Labs Lab 08/10/16 0839 08/10/16 1312  TROPIPOC 0.03 0.15*     BNPNo results for input(s): BNP, PROBNP in the last 168 hours.   DDimer No results for input(s): DDIMER in the last 168 hours.   Radiology    Dg Chest 2 View  Result Date: 08/10/2016 CLINICAL DATA:  83 year old with episodes of chest pain yesterday and again this morning. Current history of hypertension, stage 3 chronic kidney disease, COPD. Former smoker who quit in 1990. EXAM: CHEST  2 VIEW COMPARISON:  11/29/2015, 08/30/2015, 06/16/2013 and CTA chest 08/30/2015. FINDINGS: Cardiac silhouette normal in size, unchanged. Thoracic aorta atherosclerotic, unchanged. Hilar and mediastinal contours otherwise unremarkable. Scarring involving the left lower lobe and lingula, unchanged. Lungs otherwise clear. Bronchovascular markings normal. Pulmonary vascularity normal. No pleural effusions. Degenerative changes at the thoracolumbar junction. IMPRESSION: No acute cardiopulmonary disease. Stable scarring involving the left lower lobe and lingula. Electronically Signed   By: Evangeline Dakin M.D.   On: 08/10/2016 09:00    Cardiac Studies    CATH  08/11/16  Conclusion    Critical obstruction in the proximal to mid LAD, 95%, within a heavily calcified region.  Angiographically significant calcified distal left main, 60%.  Greater than 90% small first obtuse marginal  30% mid circumflex  Widely patent, dominant, RCA.  Normal left ventricular systolic function with moderate mid to distal anterior wall hypokinesis. EF 45-50%. Mildly elevated LVEDP consistent with acute  diastolic heart failure.      Patient Profile     83 y.o. male with a PMH significant for HTN, HLD, GERD, COPD, CKD stage III, and chronic back pain. He reported to the [facility] with new onset chest pain.  Assessment & Plan    CAD:   NSTEMI.  Results as above.  Plan for surgical consult.  Continue current meds.    CKD:   Stage II - III.  Follow up creat in the AM.   RISK REDUCTION:  Switched to high dose statin.     Signed, Minus Breeding, MD  08/11/2016, 5:08 PM

## 2016-08-11 NOTE — Progress Notes (Signed)
ANTICOAGULATION CONSULT NOTE  Pharmacy Consult for heparin Indication: chest pain/ACS  No Known Allergies  Patient Measurements: Height: 5\' 11"  (180.3 cm) Weight: 203 lb 9.6 oz (92.4 kg) IBW/kg (Calculated) : 75.3 Heparin Dosing Weight: 89.4 kg  Vital Signs: Temp: 98.6 F (37 C) (06/26 0745) Temp Source: Oral (06/26 0745) BP: 139/75 (06/26 1012) Pulse Rate: 78 (06/26 1016)  Labs:  Recent Labs  08/10/16 0841 08/10/16 2100 08/11/16 0516  HGB 13.6  --  12.9*  HCT 40.6  --  39.3  PLT 180  --  172  LABPROT  --   --  13.6  INR  --   --  1.04  HEPARINUNFRC  --  0.43 0.47  CREATININE 1.32*  --  1.20  TROPONINI  --   --  0.07*    Estimated Creatinine Clearance: 53.2 mL/min (by C-G formula based on SCr of 1.2 mg/dL).    Assessment: 83 yo male admitted with chest pain now s/p cath with plans for CABG consult to resume heparin 8 hours post sheath removal  Resume heparin at 1800 pm  Goal of Therapy:  Heparin level 0.3-0.7 units/ml Monitor platelets by anticoagulation protocol: Yes   Plan:  Heparin at 1250 units / hr starting at 1800 pm Daily HL, CBC  Thank you Anette Guarneri, PharmD [phone]  08/11/2016,10:33 AM

## 2016-08-11 NOTE — Interval H&P Note (Signed)
Cath Lab Visit (complete for each Cath Lab visit)  Clinical Evaluation Leading to the Procedure:   ACS: Yes.    Non-ACS:    Anginal Classification: CCS III  Anti-ischemic medical therapy: Minimal Therapy (1 class of medications)  Non-Invasive Test Results: No non-invasive testing performed  Prior CABG: No previous CABG      History and Physical Interval Note:  08/11/2016 9:12 AM  Ziyad L Feltus  has presented today for surgery, with the diagnosis of cp  The various methods of treatment have been discussed with the patient and family. After consideration of risks, benefits and other options for treatment, the patient has consented to  Procedure(s): Left Heart Cath and Coronary Angiography (N/A) as a surgical intervention .  The patient's history has been reviewed, patient examined, no change in status, stable for surgery.  I have reviewed the patient's chart and labs.  Questions were answered to the patient's satisfaction.     Belva Crome III

## 2016-08-11 NOTE — Progress Notes (Signed)
  Echocardiogram 2D Echocardiogram has been performed.  Marc Gilbert 08/11/2016, 2:53 PM

## 2016-08-12 ENCOUNTER — Other Ambulatory Visit: Payer: Self-pay | Admitting: *Deleted

## 2016-08-12 ENCOUNTER — Encounter (HOSPITAL_COMMUNITY): Payer: Self-pay | Admitting: Cardiology

## 2016-08-12 ENCOUNTER — Inpatient Hospital Stay (HOSPITAL_COMMUNITY): Payer: Medicare Other

## 2016-08-12 DIAGNOSIS — I214 Non-ST elevation (NSTEMI) myocardial infarction: Secondary | ICD-10-CM

## 2016-08-12 DIAGNOSIS — I25118 Atherosclerotic heart disease of native coronary artery with other forms of angina pectoris: Secondary | ICD-10-CM

## 2016-08-12 DIAGNOSIS — I25110 Atherosclerotic heart disease of native coronary artery with unstable angina pectoris: Secondary | ICD-10-CM

## 2016-08-12 LAB — URINALYSIS, ROUTINE W REFLEX MICROSCOPIC
Bilirubin Urine: NEGATIVE
Glucose, UA: NEGATIVE mg/dL
Hgb urine dipstick: NEGATIVE
Ketones, ur: NEGATIVE mg/dL
Leukocytes, UA: NEGATIVE
Nitrite: NEGATIVE
Protein, ur: NEGATIVE mg/dL
Specific Gravity, Urine: 1.009 (ref 1.005–1.030)
pH: 6 (ref 5.0–8.0)

## 2016-08-12 LAB — PULMONARY FUNCTION TEST
FEF 25-75 Post: 2.3 L/sec
FEF 25-75 Pre: 1.95 L/sec
FEF2575-%Change-Post: 18 %
FEF2575-%Pred-Post: 120 %
FEF2575-%Pred-Pre: 101 %
FEV1-%Change-Post: 5 %
FEV1-%Pred-Post: 97 %
FEV1-%Pred-Pre: 92 %
FEV1-Post: 2.82 L
FEV1-Pre: 2.68 L
FEV1FVC-%Change-Post: 8 %
FEV1FVC-%Pred-Pre: 102 %
FEV6-%Change-Post: 0 %
FEV6-%Pred-Post: 93 %
FEV6-%Pred-Pre: 93 %
FEV6-Post: 3.57 L
FEV6-Pre: 3.56 L
FEV6FVC-%Change-Post: 3 %
FEV6FVC-%Pred-Post: 107 %
FEV6FVC-%Pred-Pre: 104 %
FVC-%Change-Post: -2 %
FVC-%Pred-Post: 87 %
FVC-%Pred-Pre: 89 %
FVC-Post: 3.58 L
FVC-Pre: 3.68 L
Post FEV1/FVC ratio: 79 %
Post FEV6/FVC ratio: 100 %
Pre FEV1/FVC ratio: 73 %
Pre FEV6/FVC Ratio: 97 %

## 2016-08-12 LAB — CBC
HCT: 40.5 % (ref 39.0–52.0)
Hemoglobin: 13.3 g/dL (ref 13.0–17.0)
MCH: 30.7 pg (ref 26.0–34.0)
MCHC: 32.8 g/dL (ref 30.0–36.0)
MCV: 93.5 fL (ref 78.0–100.0)
Platelets: 171 10*3/uL (ref 150–400)
RBC: 4.33 MIL/uL (ref 4.22–5.81)
RDW: 12.7 % (ref 11.5–15.5)
WBC: 7.1 10*3/uL (ref 4.0–10.5)

## 2016-08-12 LAB — HEPARIN LEVEL (UNFRACTIONATED): Heparin Unfractionated: 0.3 IU/mL (ref 0.30–0.70)

## 2016-08-12 MED ORDER — TRAMADOL HCL 50 MG PO TABS: 50.0000 mg | ORAL_TABLET | Freq: Four times a day (QID) | ORAL | Status: DC | PRN

## 2016-08-12 MED ORDER — PREDNISONE 5 MG (21) PO TBPK
5.0000 mg | ORAL_TABLET | ORAL | Status: AC
  Administered 2016-08-12: 5 mg via ORAL

## 2016-08-12 MED ORDER — ALBUTEROL SULFATE (2.5 MG/3ML) 0.083% IN NEBU
2.5000 mg | INHALATION_SOLUTION | Freq: Once | RESPIRATORY_TRACT | Status: AC
  Administered 2016-08-12: 2.5 mg via RESPIRATORY_TRACT

## 2016-08-12 MED ORDER — PREDNISONE 5 MG (21) PO TBPK
10.0000 mg | ORAL_TABLET | Freq: Every morning | ORAL | Status: AC
  Administered 2016-08-12: 10 mg via ORAL
  Filled 2016-08-12: qty 21

## 2016-08-12 MED ORDER — COLCHICINE 0.6 MG PO TABS
0.6000 mg | ORAL_TABLET | Freq: Once | ORAL | Status: AC
  Administered 2016-08-12: 0.6 mg via ORAL
  Filled 2016-08-12: qty 1

## 2016-08-12 MED ORDER — PREDNISONE 5 MG (21) PO TBPK
5.0000 mg | ORAL_TABLET | Freq: Three times a day (TID) | ORAL | Status: AC
  Administered 2016-08-13 (×3): 5 mg via ORAL

## 2016-08-12 MED ORDER — COLCHICINE 0.6 MG PO TABS
1.2000 mg | ORAL_TABLET | Freq: Once | ORAL | Status: AC
  Administered 2016-08-12: 1.2 mg via ORAL
  Filled 2016-08-12: qty 2

## 2016-08-12 MED ORDER — MAGNESIUM HYDROXIDE 400 MG/5ML PO SUSP
30.0000 mL | Freq: Every day | ORAL | Status: DC | PRN
  Administered 2016-08-12: 30 mL via ORAL
  Filled 2016-08-12: qty 30

## 2016-08-12 MED ORDER — COLCHICINE 0.6 MG PO TABS: 0.6000 mg | ORAL_TABLET | Freq: Two times a day (BID) | ORAL | Status: DC

## 2016-08-12 MED ORDER — PREDNISONE 5 MG (21) PO TBPK
10.0000 mg | ORAL_TABLET | Freq: Every evening | ORAL | Status: AC
  Administered 2016-08-12: 10 mg via ORAL

## 2016-08-12 MED ORDER — PREDNISONE 5 MG (21) PO TBPK
5.0000 mg | ORAL_TABLET | Freq: Four times a day (QID) | ORAL | Status: DC
  Administered 2016-08-14: 5 mg via ORAL
  Filled 2016-08-12: qty 21

## 2016-08-12 MED ORDER — PREDNISONE 5 MG (21) PO TBPK
10.0000 mg | ORAL_TABLET | Freq: Every evening | ORAL | Status: AC
  Administered 2016-08-13: 10 mg via ORAL

## 2016-08-12 MED ORDER — COLCHICINE 0.6 MG PO TABS
0.6000 mg | ORAL_TABLET | Freq: Two times a day (BID) | ORAL | Status: DC
  Administered 2016-08-12 – 2016-08-13 (×3): 0.6 mg via ORAL
  Filled 2016-08-12 (×3): qty 1

## 2016-08-12 NOTE — Plan of Care (Signed)
Problem: Safety: Goal: Ability to remain free from injury will improve Outcome: Completed/Met Date Met: 08/12/16 Pt compliant with use of call light to request assistance  Problem: Health Behavior/Discharge Planning: Goal: Ability to manage health-related needs will improve Outcome: Progressing Verbal and printed education provided to pt and family in preparation for CABG

## 2016-08-12 NOTE — Progress Notes (Signed)
ANTICOAGULATION CONSULT NOTE  Pharmacy Consult for heparin Indication: chest pain/ACS  No Known Allergies  Patient Measurements: Height: 5\' 11"  (180.3 cm) Weight: 202 lb 3.2 oz (91.7 kg) IBW/kg (Calculated) : 75.3 Heparin Dosing Weight: 89.4 kg  Vital Signs: Temp: 98.6 F (37 C) (06/27 0815) Temp Source: Oral (06/27 0815) BP: 148/79 (06/27 0815) Pulse Rate: 83 (06/27 0815)  Labs:  Recent Labs  08/10/16 0841 08/10/16 2100 08/11/16 0516 08/11/16 1033 08/11/16 1543 08/12/16 0520 08/12/16 1011  HGB 13.6  --  12.9*  --   --  13.3  --   HCT 40.6  --  39.3  --   --  40.5  --   PLT 180  --  172  --   --  171  --   LABPROT  --   --  13.6  --   --   --   --   INR  --   --  1.04  --   --   --   --   HEPARINUNFRC  --  0.43 0.47  --   --   --  0.30  CREATININE 1.32*  --  1.20  --   --   --   --   TROPONINI  --   --  0.07* 0.06* 0.04*  --   --     Estimated Creatinine Clearance: 53.1 mL/min (by C-G formula based on SCr of 1.2 mg/dL).    Assessment: 83 yo male admitted with chest pain now s/p cath awaiting possible CABG Heparin level = 0.3  Goal of Therapy:  Heparin level 0.3-0.7 units/ml Monitor platelets by anticoagulation protocol: Yes   Plan:  Heparin to 1350 units / hr Daily HL, CBC  Thank you Anette Guarneri, PharmD [phone]  08/12/2016,11:11 AM

## 2016-08-12 NOTE — Progress Notes (Addendum)
Progress Note  Patient Name: Marc Gilbert Date of Encounter: 08/12/2016  Primary Cardiologist:   (New) Dr. Percival Spanish  Subjective   No further chest pain.  No SOB.   He did have chest pain last night and has a gout flair in his right great toe.  Inpatient Medications    Scheduled Meds: . aspirin  81 mg Oral Daily  . atorvastatin  80 mg Oral q1800  . metoprolol tartrate  12.5 mg Oral BID  .  morphine injection  4 mg Intravenous Once  . psyllium  1 packet Oral BID  . sodium chloride flush  3 mL Intravenous Q12H  . tamsulosin  0.8 mg Oral Daily   Continuous Infusions: . sodium chloride    . heparin 1,250 Units/hr (08/11/16 1833)  . nitroGLYCERIN 10 mcg/min (08/12/16 0454)   PRN Meds: sodium chloride, acetaminophen, acetaminophen, ALPRAZolam, ondansetron (ZOFRAN) IV, ondansetron (ZOFRAN) IV, oxyCODONE-acetaminophen, sodium chloride flush   Vital Signs    Vitals:   08/12/16 0400 08/12/16 0452 08/12/16 0509 08/12/16 0815  BP:   139/82 (!) 148/79  Pulse:    83  Resp:      Temp:  97.9 F (36.6 C)  98.6 F (37 C)  TempSrc:  Oral  Oral  SpO2:   99% 96%  Weight: 202 lb 3.2 oz (91.7 kg)     Height:        Intake/Output Summary (Last 24 hours) at 08/12/16 0827 Last data filed at 08/12/16 0300  Gross per 24 hour  Intake           2043.3 ml  Output             1850 ml  Net            193.3 ml   Filed Weights   08/10/16 1342 08/11/16 0500 08/12/16 0400  Weight: 202 lb 12.8 oz (92 kg) 203 lb 9.6 oz (92.4 kg) 202 lb 3.2 oz (91.7 kg)    Telemetry    NSR - Personally Reviewed  ECG    NA - Personally Reviewed  Physical Exam   GEN: No  acute distress.   Neck: No  JVD Cardiac:  RRR, no murmurs, rubs, or gallops.  Respiratory: Clear   to auscultation bilaterally. GI: Soft, nontender, non-distended, normal bowel sounds  MS:  No edema; No deformity.  Right wrist OK without bruising or bleeding Neuro:   Nonfocal  Psych: Oriented and appropriate    Labs      Chemistry  Recent Labs Lab 08/10/16 0841 08/11/16 0516  NA 136 138  K 4.4 4.2  CL 105 108  CO2 23 23  GLUCOSE 121* 111*  BUN 22* 21*  CREATININE 1.32* 1.20  CALCIUM 8.7* 8.5*  GFRNONAA 48* 54*  GFRAA 55* >60  ANIONGAP 8 7     Hematology  Recent Labs Lab 08/10/16 0841 08/11/16 0516 08/12/16 0520  WBC 5.3 6.0 7.1  RBC 4.39 4.22 4.33  HGB 13.6 12.9* 13.3  HCT 40.6 39.3 40.5  MCV 92.5 93.1 93.5  MCH 31.0 30.6 30.7  MCHC 33.5 32.8 32.8  RDW 12.7 12.9 12.7  PLT 180 172 171    Cardiac Enzymes  Recent Labs Lab 08/11/16 0516 08/11/16 1033 08/11/16 1543  TROPONINI 0.07* 0.06* 0.04*     Recent Labs Lab 08/10/16 0839 08/10/16 1312  TROPIPOC 0.03 0.15*     BNPNo results for input(s): BNP, PROBNP in the last 168 hours.   DDimer No results for input(s):  DDIMER in the last 168 hours.   Radiology    Dg Chest 2 View  Result Date: 08/10/2016 CLINICAL DATA:  83 year old with episodes of chest pain yesterday and again this morning. Current history of hypertension, stage 3 chronic kidney disease, COPD. Former smoker who quit in 1990. EXAM: CHEST  2 VIEW COMPARISON:  11/29/2015, 08/30/2015, 06/16/2013 and CTA chest 08/30/2015. FINDINGS: Cardiac silhouette normal in size, unchanged. Thoracic aorta atherosclerotic, unchanged. Hilar and mediastinal contours otherwise unremarkable. Scarring involving the left lower lobe and lingula, unchanged. Lungs otherwise clear. Bronchovascular markings normal. Pulmonary vascularity normal. No pleural effusions. Degenerative changes at the thoracolumbar junction. IMPRESSION: No acute cardiopulmonary disease. Stable scarring involving the left lower lobe and lingula. Electronically Signed   By: Evangeline Dakin M.D.   On: 08/10/2016 09:00    Cardiac Studies    CATH  08/11/16  Conclusion    Critical obstruction in the proximal to mid LAD, 95%, within a heavily calcified region.  Angiographically significant calcified distal left  main, 60%.  Greater than 90% small first obtuse marginal  30% mid circumflex  Widely patent, dominant, RCA.  Normal left ventricular systolic function with moderate mid to distal anterior wall hypokinesis. EF 45-50%. Mildly elevated LVEDP consistent with acute diastolic heart failure.      Patient Profile     83 y.o. male with a PMH significant for HTN, HLD, GERD, COPD, CKD stage III, and chronic back pain. He reported to the [facility] with new onset chest pain.  Assessment & Plan    CAD:   NSTEMI.  Results as above.  Plan for surgical consult.  Continue current meds.    CKD:   Stage II - III.  Follow up creat in the AM.   RISK REDUCTION:  Switched to high dose statin.     Signed, Minus Breeding, MD  08/12/2016, 8:27 AM

## 2016-08-12 NOTE — Consult Note (Signed)
[facility] CM Primary Care Navigator  08/12/2016  [facility] 1930-03-12 [id number]   Met with patient and daughter [facility]) at the bedside to identify possible discharge needs. Patientreports having chest pains that led to this admission. He states that him and his wife currently resides at [facility]. [facility] end[facility].   Patient verbalized using [place] [facility] [place] order serviceto obtain medications without any problem.   Patient reports managinghis ownmedications at home with use of "pill box" system filled weekly.  He reports being able to drive prior to admission, however, his wife [facility]) drives as well and can provide transportation to his doctors'appointments after discharge if needed.  Patient's wife will serve as his primary caregiver at home. His daughters and son will be able to assist with his care when needed.  Anticipated discharge plan is home- back to [facility] (after surgery) per patient.  He voiced understanding to call primary care provider's officewhen he returns home,for a post discharge follow-up appointment within a week or sooner if needs arise.Patient letter (with PCP's contact number) was provided as areminder.  Patient communicated no health managementneeds or concerns at this time. [facility] care management information provided for future needs that he may have.   For questions, please contact:  [facility], BSN, RN- [facility] Primary Care Navigator  Telephone: [phone] [place]

## 2016-08-12 NOTE — Progress Notes (Signed)
CARDIAC REHAB PHASE I   PRE:  Rate/Rhythm: 80 SR    BP: sitting 107/62    SaO2: 98 RA  MODE:  Ambulation: 350 ft   POST:  Rate/Rhythm: 103 ST    BP: sitting 120/68     SaO2:   Pt eager to get out of bed. Walked without CP or c/o. To recliner. Discussed sternal precautions, mobility, IS, and d/c planning with pt and wife. Voiced understanding and asked appropriate questions. Gave book, careguide, and video to watch.  [place], ACSM 08/12/2016 2:41 PM

## 2016-08-12 NOTE — Significant Event (Signed)
Patient c/o severe gout pain. Started on colchicine, will continue if needed.

## 2016-08-12 NOTE — Consult Note (Signed)
[address]       [place] [address]             [phone]      Cardiothoracic Surgery Consultation  Reason for Consult: Significant left main and high grade LAD stenosis with NSTEMI Referring Physician: Dr. Elenor Quinones is an 83 y.o. male.  HPI:   The patient is an active 83 year old gentleman with a history of hypertension, hyperlipidemia, remote smoke and mild COPD, and stage III CKD who presented with new onset substernal chest pain radiating down left arm. He had some prior chest pain in July and October 2017 and was seen in the ER and had a low risk myoview study. He had remained active at [facility] doing aerobic exercise and weight lifting. He was suppose to leave on a vacation to the [place] for a river boat cruise with his wife but had 3 episodes of this chest pain associated with shortness of breath and diaphoresis. These episodes occurred with minimal activity. His initial troponin was negative and peaked at 0.06. ECG showed a possible old anterior MI with no acute changes. Cath yesterday showed a 60% calcified distal LM with 95% proximal to mid LAD stenosis within a heavily calcified region. The LCX had a small first OM with 90% stenosis and a larger second OM. The RCA was dominant, large and widely patent. LVEF was 45-50% with mid to distal anterior hypokinesis and mildly elevated LVEDP of 18. An echo showed an LVEF of 55-60% with normal LV systolic function. He has remained free of further symptoms on heparin and NTG drips.  Past Medical History:  Diagnosis Date  . Arthritis    "right hand; back" (08/30/2015)  . Cancer (HCC)    skin  squamous and basal  . Chronic lower back pain   . CKD (chronic kidney disease), stage III   . COPD (chronic obstructive pulmonary disease) (HCC)    no home O2  . Dysplastic colon polyp age 80   carcinoma in situ  . GERD (gastroesophageal reflux disease)    occ  . H/O hiatal hernia   . Hyperlipidemia   .  Hypertension   . Prostate cancer ([facility]) 07/2014   active surveillance, [facility] 6    Past Surgical History:  Procedure Laterality Date  . CATARACT EXTRACTION W/ INTRAOCULAR LENS IMPLANT Left 08/27/15  . JOINT REPLACEMENT    . LEFT HEART CATH AND CORONARY ANGIOGRAPHY N/A 08/11/2016   Procedure: Left Heart Cath and Coronary Angiography;  Surgeon: Belva Crome, MD;  Location: [facility];  Service: Cardiovascular;  Laterality: N/A;  . MOHS SURGERY     multiple SCC  . PROSTATE BIOPSY  07/2014  . TONSILLECTOMY    . TOTAL HIP ARTHROPLASTY  05/26/2011   Procedure: TOTAL HIP ARTHROPLASTY;  Surgeon: Garald Balding, MD;  Location: [facility];  Service: Orthopedics;  Laterality: Right;  . ULTRASOUND GUIDANCE FOR VASCULAR ACCESS  08/11/2016   Procedure: Ultrasound Guidance For Vascular Access;  Surgeon: Belva Crome, MD;  Location: [facility];  Service: Cardiovascular;;    Family History  Problem Relation Age of Onset  . Cancer Mother        breast  . Diabetes Father     Social History:  reports that he quit smoking about 28 years ago. His smoking use included Cigarettes. He has a 12.50 pack-year smoking history. He has never used smokeless tobacco. He reports that  he drinks about 6.0 oz of alcohol per week . He reports that he does not use drugs.   Lives with his wife at [facility]. She is scheduled to have a shoulder replacement next month.  His daughter is an Software engineer in [place]   Allergies: No Known Allergies  Medications:  I have reviewed the patient's current medications. Prior to Admission:  Prescriptions Prior to Admission  Medication Sig Dispense Refill Last Dose  . acetaminophen (TYLENOL) 500 MG tablet Take 500-1,000 mg by mouth every 6 (six) hours as needed for mild pain or moderate pain.   08/10/2016 at Unknown time  . aspirin 81 MG chewable tablet Chew 81 mg by mouth daily.    08/10/2016 at Unknown time  . losartan (COZAAR) 25 MG tablet Take 25  mg by mouth every morning.   08/10/2016 at Unknown time  . Multiple Vitamins-Minerals (ONE-A-DAY MENS 50+ ADVANTAGE) TABS Take 1 tablet by mouth daily with breakfast.   08/10/2016 at Unknown time  . Omega-3 Fatty Acids (FISH OIL) 600 MG CAPS Take 1,800 mg by mouth every morning.    08/10/2016 at Unknown time  . pravastatin (PRAVACHOL) 40 MG tablet Take 40 mg by mouth every evening.    08/09/2016 at Unknown time  . psyllium (METAMUCIL) 58.6 % packet Take 1 packet by mouth 2 (two) times daily.    08/09/2016 at Unknown time  . Tamsulosin HCl (FLOMAX) 0.4 MG CAPS Take 0.8 mg by mouth daily.    08/09/2016 at Unknown time   Scheduled: . aspirin  81 mg Oral Daily  . atorvastatin  80 mg Oral q1800  . colchicine  0.6 mg Oral Once  . metoprolol tartrate  12.5 mg Oral BID  .  morphine injection  4 mg Intravenous Once  . psyllium  1 packet Oral BID  . sodium chloride flush  3 mL Intravenous Q12H  . tamsulosin  0.8 mg Oral Daily   Continuous: . sodium chloride    . heparin 1,250 Units/hr (08/11/16 1833)  . nitroGLYCERIN 10 mcg/min (08/12/16 0454)   MIW:OEHOZY chloride, acetaminophen, acetaminophen, ALPRAZolam, ondansetron (ZOFRAN) IV, ondansetron (ZOFRAN) IV, oxyCODONE-acetaminophen, sodium chloride flush Anti-infectives    None      Results for orders placed or performed during the hospital encounter of 08/10/16 (from the past 48 hour(s))  I-stat troponin, ED     Status: None   Collection Time: 08/10/16  8:39 AM  Result Value Ref Range   Troponin i, poc 0.03 0.00 - 0.08 ng/mL   Comment 3            Comment: Due to the release kinetics of cTnI, a negative result within the first hours of the onset of symptoms does not rule out myocardial infarction with certainty. If myocardial infarction is still suspected, repeat the test at appropriate intervals.   Basic metabolic panel     Status: Abnormal   Collection Time: 08/10/16  8:41 AM  Result Value Ref Range   Sodium 136 135 - 145 mmol/L    Potassium 4.4 3.5 - 5.1 mmol/L   Chloride 105 101 - 111 mmol/L   CO2 23 22 - 32 mmol/L   Glucose, Bld 121 (H) 65 - 99 mg/dL   BUN 22 (H) 6 - 20 mg/dL   Creatinine, Ser 1.32 (H) 0.61 - 1.24 mg/dL   Calcium 8.7 (L) 8.9 - 10.3 mg/dL   GFR calc non Af Amer 48 (L) >60 mL/min   GFR calc Af Amer 55 (L) >60  mL/min    Comment: (NOTE) The eGFR has been calculated using the CKD EPI equation. This calculation has not been validated in all clinical situations. eGFR's persistently <60 mL/min signify possible Chronic Kidney Disease.    Anion gap 8 5 - 15  CBC     Status: None   Collection Time: 08/10/16  8:41 AM  Result Value Ref Range   WBC 5.3 4.0 - 10.5 K/uL   RBC 4.39 4.22 - 5.81 MIL/uL   Hemoglobin 13.6 13.0 - 17.0 g/dL   HCT 40.6 39.0 - 52.0 %   MCV 92.5 78.0 - 100.0 fL   MCH 31.0 26.0 - 34.0 pg   MCHC 33.5 30.0 - 36.0 g/dL   RDW 12.7 11.5 - 15.5 %   Platelets 180 150 - 400 K/uL  Lipid panel     Status: None   Collection Time: 08/10/16  1:02 PM  Result Value Ref Range   Cholesterol 157 0 - 200 mg/dL   Triglycerides 72 <150 mg/dL   HDL 56 >40 mg/dL   Total CHOL/HDL Ratio 2.8 RATIO   VLDL 14 0 - 40 mg/dL   LDL Cholesterol 87 0 - 99 mg/dL    Comment:        Total Cholesterol/HDL:CHD Risk Coronary Heart Disease Risk Table                     Men   Women  1/2 Average Risk   3.4   3.3  Average Risk       5.0   4.4  2 X Average Risk   9.6   7.1  3 X Average Risk  23.4   11.0        Use the calculated Patient Ratio above and the CHD Risk Table to determine the patient's CHD Risk.        ATP III CLASSIFICATION (LDL):  <100     mg/dL   Optimal  100-129  mg/dL   Near or Above                    Optimal  130-159  mg/dL   Borderline  160-189  mg/dL   High  >190     mg/dL   Very High   I-stat troponin, ED     Status: Abnormal   Collection Time: 08/10/16  1:12 PM  Result Value Ref Range   Troponin i, poc 0.15 (HH) 0.00 - 0.08 ng/mL   Comment NOTIFIED PHYSICIAN    Comment 3             Comment: Due to the release kinetics of cTnI, a negative result within the first hours of the onset of symptoms does not rule out myocardial infarction with certainty. If myocardial infarction is still suspected, repeat the test at appropriate intervals.   Hemoglobin A1c     Status: Abnormal   Collection Time: 08/10/16  2:35 PM  Result Value Ref Range   Hgb A1c MFr Bld 5.7 (H) 4.8 - 5.6 %    Comment: (NOTE)         Pre-diabetes: 5.7 - 6.4         Diabetes: >6.4         Glycemic control for adults with diabetes: <7.0    Mean Plasma Glucose 117 mg/dL    Comment: (NOTE) Performed At: [facility] [address] [place], [place] [id number] Lindon Romp MD NK:[id number]   Magnesium     Status:  None   Collection Time: 08/10/16  2:35 PM  Result Value Ref Range   Magnesium 1.9 1.7 - 2.4 mg/dL  MRSA PCR Screening     Status: None   Collection Time: 08/10/16  2:37 PM  Result Value Ref Range   MRSA by PCR NEGATIVE NEGATIVE    Comment:        The GeneXpert MRSA Assay (FDA approved for NASAL specimens only), is one component of a comprehensive MRSA colonization surveillance program. It is not intended to diagnose MRSA infection nor to guide or monitor treatment for MRSA infections.   Heparin level (unfractionated)     Status: None   Collection Time: 08/10/16  9:00 PM  Result Value Ref Range   Heparin Unfractionated 0.43 0.30 - 0.70 IU/mL    Comment:        IF HEPARIN RESULTS ARE BELOW EXPECTED VALUES, AND PATIENT DOSAGE HAS BEEN CONFIRMED, SUGGEST FOLLOW UP TESTING OF ANTITHROMBIN III LEVELS.   Basic metabolic panel     Status: Abnormal   Collection Time: 08/11/16  5:16 AM  Result Value Ref Range   Sodium 138 135 - 145 mmol/L   Potassium 4.2 3.5 - 5.1 mmol/L   Chloride 108 101 - 111 mmol/L   CO2 23 22 - 32 mmol/L   Glucose, Bld 111 (H) 65 - 99 mg/dL   BUN 21 (H) 6 - 20 mg/dL   Creatinine, Ser 1.20 0.61 - 1.24 mg/dL   Calcium 8.5 (L) 8.9 - 10.3 mg/dL    GFR calc non Af Amer 54 (L) >60 mL/min   GFR calc Af Amer >60 >60 mL/min    Comment: (NOTE) The eGFR has been calculated using the CKD EPI equation. This calculation has not been validated in all clinical situations. eGFR's persistently <60 mL/min signify possible Chronic Kidney Disease.    Anion gap 7 5 - 15  CBC     Status: Abnormal   Collection Time: 08/11/16  5:16 AM  Result Value Ref Range   WBC 6.0 4.0 - 10.5 K/uL   RBC 4.22 4.22 - 5.81 MIL/uL   Hemoglobin 12.9 (L) 13.0 - 17.0 g/dL   HCT 39.3 39.0 - 52.0 %   MCV 93.1 78.0 - 100.0 fL   MCH 30.6 26.0 - 34.0 pg   MCHC 32.8 30.0 - 36.0 g/dL   RDW 12.9 11.5 - 15.5 %   Platelets 172 150 - 400 K/uL  Heparin level (unfractionated)     Status: None   Collection Time: 08/11/16  5:16 AM  Result Value Ref Range   Heparin Unfractionated 0.47 0.30 - 0.70 IU/mL    Comment:        IF HEPARIN RESULTS ARE BELOW EXPECTED VALUES, AND PATIENT DOSAGE HAS BEEN CONFIRMED, SUGGEST FOLLOW UP TESTING OF ANTITHROMBIN III LEVELS.   Protime-INR     Status: None   Collection Time: 08/11/16  5:16 AM  Result Value Ref Range   Prothrombin Time 13.6 11.4 - 15.2 seconds   INR 1.04   Troponin I (q 6hr x 3)     Status: Abnormal   Collection Time: 08/11/16  5:16 AM  Result Value Ref Range   Troponin I 0.07 (HH) <0.03 ng/mL    Comment: CRITICAL RESULT CALLED TO, READ BACK BY AND VERIFIED WITH: DUVALL D,RN 08/11/16 0624 WAYK   Troponin I (q 6hr x 3)     Status: Abnormal   Collection Time: 08/11/16 10:33 AM  Result Value Ref Range   Troponin I 0.06 (  HH) <0.03 ng/mL    Comment: CRITICAL VALUE NOTED.  VALUE IS CONSISTENT WITH PREVIOUSLY REPORTED AND CALLED VALUE.  Troponin I (q 6hr x 3)     Status: Abnormal   Collection Time: 08/11/16  3:43 PM  Result Value Ref Range   Troponin I 0.04 (HH) <0.03 ng/mL    Comment: CRITICAL VALUE NOTED.  VALUE IS CONSISTENT WITH PREVIOUSLY REPORTED AND CALLED VALUE.    Dg Chest 2 View  Result Date:  08/10/2016 CLINICAL DATA:  83 year old with episodes of chest pain yesterday and again this morning. Current history of hypertension, stage 3 chronic kidney disease, COPD. Former smoker who quit in 1990. EXAM: CHEST  2 VIEW COMPARISON:  11/29/2015, 08/30/2015, 06/16/2013 and CTA chest 08/30/2015. FINDINGS: Cardiac silhouette normal in size, unchanged. Thoracic aorta atherosclerotic, unchanged. Hilar and mediastinal contours otherwise unremarkable. Scarring involving the left lower lobe and lingula, unchanged. Lungs otherwise clear. Bronchovascular markings normal. Pulmonary vascularity normal. No pleural effusions. Degenerative changes at the thoracolumbar junction. IMPRESSION: No acute cardiopulmonary disease. Stable scarring involving the left lower lobe and lingula. Electronically Signed   By: Evangeline Dakin M.D.   On: 08/10/2016 09:00    Review of Systems  Constitutional: Positive for diaphoresis. Negative for chills, fever, malaise/fatigue and weight loss.  HENT: Negative.   Eyes: Negative.   Respiratory: Positive for shortness of breath. Negative for cough and wheezing.   Cardiovascular: Positive for chest pain. Negative for palpitations, orthopnea, leg swelling and PND.  Gastrointestinal: Negative.   Genitourinary: Negative.   Musculoskeletal: Positive for back pain.  Skin: Negative.   Neurological: Negative.   Endo/Heme/Allergies: Negative.   Psychiatric/Behavioral: Negative.    Blood pressure (!) 144/95, pulse 75, temperature 97.9 F (36.6 C), temperature source Oral, resp. rate 20, height 5' 11"  (1.803 m), weight 91.7 kg (202 lb 3.2 oz), SpO2 95 %. Physical Exam  Constitutional: He is oriented to person, place, and time. He appears well-developed and well-nourished. No distress.  HENT:  Head: Normocephalic and atraumatic.  Mouth/Throat: Oropharynx is clear and moist.  Eyes: Conjunctivae and EOM are normal. Pupils are equal, round, and reactive to light.  Neck: Normal range of  motion. Neck supple. No JVD present. No thyromegaly present.  Cardiovascular: Normal rate, regular rhythm, normal heart sounds and intact distal pulses.  Exam reveals no gallop and no friction rub.   No murmur heard. Respiratory: Effort normal and breath sounds normal. No respiratory distress.  GI: Soft. Bowel sounds are normal. He exhibits no distension and no mass. There is no tenderness.  Musculoskeletal: Normal range of motion. He exhibits no edema.  Lymphadenopathy:    He has no cervical adenopathy.  Neurological: He is alert and oriented to person, place, and time. He has normal strength. No cranial nerve deficit or sensory deficit.  Skin: Skin is warm and dry.  Psychiatric: He has a normal mood and affect.   HARLIE RAGLE  Cardiac catheterization  Order# [id number]  Reading physician: Belva Crome, MD Ordering physician: Belva Crome, MD Study date: 08/11/16  Physicians   Panel Physicians Referring Physician Case Authorizing Physician  Belva Crome, MD (Primary)    Procedures   Left Heart Cath and Coronary Angiography  Conclusion    Critical obstruction in the proximal to mid LAD, 95%, within a heavily calcified region.  Angiographically significant calcified distal left main, 60%.  Greater than 90% small first obtuse marginal  30% mid circumflex  Widely patent, dominant, RCA.  Normal left ventricular systolic function  with moderate mid to distal anterior wall hypokinesis. EF 45-50%. Mildly elevated LVEDP consistent with acute diastolic heart failure.  RECOMMENDATIONS:   Given angiographically significant left main, need to discuss treatment options, bypass surgery versus culprit lesion PCI on the LAD lesion (utilizing rotational or orbital atherectomy). Significant other comorbidities may impact treatment decision.   Surgical consultation has been requested.  Indications   Unstable angina (HCC) [I20.0 (ICD-10-CM)]  Coronary artery disease involving native  coronary artery of native heart with unstable angina pectoris ([facility]) [I25.110 (ICD-10-CM)]  Procedural Details/Technique   Technical Details The right radial area was sterilely prepped and draped. Intravenous sedation with Versed and fentanyl was administered. 1% Xylocaine was infiltrated to achieve local analgesia. Using real-time vascular ultrasound, a double wall stick with an angiocath was utilized to obtain intra-arterial access. The modified Seldinger technique was used to place a 58F " Slender" sheath in the right radial artery. Weight based heparin was administered. Coronary angiography was done using 5 F catheters. Right coronary angiography was performed with a JR4. Left ventricular hemodymic recordings were performed using the JR 4 catheter. Left coronary angiography was performed with a JL 3.5 cm.  Hemostasis was achieved using a pneumatic band.  During this procedure the patient is administered a total of Versed 1 mg and Fentanyl 50 mg to achieve and maintain moderate conscious sedation. The patient's heart rate, blood pressure, and oxygen saturation are monitored continuously during the procedure. The period of conscious sedation is 44 minutes, of which I was present face-to-face 100% of this time.   Estimated blood loss <50 mL.  During this procedure the patient was administered the following to achieve and maintain moderate conscious sedation: Versed 1 mg, Fentanyl 50 mcg, while the patient's heart rate, blood pressure, and oxygen saturation were continuously monitored. The period of conscious sedation was 44 minutes, of which I was present face-to-face 100% of this time.    Coronary Findings   Dominance: Co-dominant  Left Main  LM lesion, 60% stenosed.  Left Anterior Descending  Mid LAD lesion, 95% stenosed. The lesion is eccentric. The lesion is calcified.  First Diagonal Branch  Vessel is small in size.  Left Circumflex  Prox Cx to Dist Cx lesion, 30% stenosed.  First Obtuse  Marginal Branch  Vessel is small in size.  Ost 1st Mrg lesion, 90% stenosed.  Wall Motion   Resting               Left Heart   Left Ventricle The left ventricular size is normal. There is mild left ventricular systolic dysfunction. The left ventricular ejection fraction is 45-50% by visual estimate.    Coronary Diagrams   Diagnostic Diagram       Implants     No implant documentation for this case.  PACS Images   Show images for Cardiac catheterization   Link to Procedure Log   Procedure Log    Hemo Data    Most Recent Value  AO Systolic Pressure 470 mmHg  AO Diastolic Pressure 62 mmHg  AO Mean 84 mmHg  LV Systolic Pressure 962 mmHg  LV Diastolic Pressure 11 mmHg  LV EDP 18 mmHg  Arterial Occlusion Pressure Extended Systolic Pressure 836 mmHg  Arterial Occlusion Pressure Extended Diastolic Pressure 63 mmHg  Arterial Occlusion Pressure Extended Mean Pressure 89 mmHg  Left Ventricular Apex Extended Systolic Pressure 629 mmHg  Left Ventricular Apex Extended Diastolic Pressure 9 mmHg  Left Ventricular Apex Extended EDP Pressure 20 mmHg    Mohamedamin  L [place]  ECHO COMPLETE W IMAGE ENHANCING AGENT  Order# [id number]  Reading physician: Pixie Casino, MD Ordering physician: Ledora Bottcher, PA Study date: 08/11/16  Study Result   Result status: Final result                              *[facility]*                         [address], [place] [address]                            [phone]  ------------------------------------------------------------------- Transthoracic Echocardiography  Patient:    Taha, Dimond MR #:       [id number] Study Date: 08/11/2016 Gender:     M Age:        28 Height:     180.3 cm Weight:     92.1 kg BSA:        2.17 m^2 Pt. Status: Room:       [id number]   ADMITTING    Minus Breeding, MD  ATTENDING    Minus Breeding, MD  PERFORMING    [facility], Inpatient  ORDERING     [facility], Tami Lin  REFERRING    [facility], Tami Lin  SONOGRAPHER  Dance, Tiffany  cc:  ------------------------------------------------------------------- LV EF: 55% -   60%  ------------------------------------------------------------------- Indications:      Chest pain 786.51.  ------------------------------------------------------------------- History:   PMH:  Chronic Kidney Disease.  Chronic obstructive pulmonary disease.  Risk factors:  Hypertension. Dyslipidemia.  ------------------------------------------------------------------- Study Conclusions  - Left ventricle: The cavity size was normal. Wall thickness was   normal. Systolic function was normal. The estimated ejection   fraction was in the range of 55% to 60%. Incoordinate septal   motion. Doppler parameters are consistent with abnormal left   ventricular relaxation (grade 1 diastolic dysfunction). The E/e&'   ratio is <8, suggesting normal LV filling pressure. - Aortic valve: Sclerosis without stenosis. There was no   regurgitation. - Aorta: Mildly dilated aortic root. Aortic root dimension: 39 mm   (ED). - Mitral valve: Mildly thickened leaflets . There was trivial   regurgitation. - Left atrium: The atrium was normal in size. - Right ventricle: The cavity size was mildly dilated. Moderate   systolic dysfunction. Lateral annulus peak S velocity: 5.33 cm/s. - Pulmonary arteries: PA peak pressure: 32 mm Hg (S). - Inferior vena cava: The vessel was normal in size. The   respirophasic diameter changes were in the normal range (>= 50%),   consistent with normal central venous pressure.  Impressions:  - Compared to a prior study in 08/2015, the LVEF has improved to   55-60%. There is RV is mildly dilated with moderate systolic   dysfunction.  ------------------------------------------------------------------- Study data:  Comparison was made to the study of 08/31/2015.   Study status:  Routine.  Procedure:  The patient reported no pain pre or post test. Transthoracic echocardiography. Image quality was adequate.  Study completion:  There were no complications. Transthoracic echocardiography.  M-mode, complete 2D, spectral Doppler, and color Doppler.  Birthdate:  Patient birthdate: 1930-11-26.  Age:  Patient is 83 yr old.  Sex:  Gender: male. BMI: 28.3 kg/m^2.  Blood pressure:     135/79  Patient status: Inpatient.  Study date:  Study date: 08/11/2016. Study time: 01:25 PM.  Location:  Bedside.  -------------------------------------------------------------------  ------------------------------------------------------------------- Left ventricle:  The cavity size was normal. Wall thickness was normal. Systolic function was normal. The estimated ejection fraction was in the range of 55% to 60%. Incoordinate septal motion. Doppler parameters are consistent with abnormal left ventricular relaxation (grade 1 diastolic dysfunction). The E/e&' ratio is <8, suggesting normal LV filling pressure.  ------------------------------------------------------------------- Aortic valve:   Structurally normal valve. Trileaflet. Cusp separation was normal. Sclerosis without stenosis.  Doppler: Transvalvular velocity was within the normal range. There was no stenosis. There was no regurgitation.  ------------------------------------------------------------------- Aorta:  Mildly dilated aortic root.  ------------------------------------------------------------------- Mitral valve:   Mildly thickened leaflets .  Doppler:  There was trivial regurgitation.    Mean gradient (D): 1 mm Hg.  ------------------------------------------------------------------- Left atrium:  The atrium was normal in size.  ------------------------------------------------------------------- Right ventricle:  The cavity size was mildly dilated. Moderate systolic  dysfunction.  ------------------------------------------------------------------- Pulmonic valve:    The valve appears to be grossly normal. Doppler:  There was trivial regurgitation.  ------------------------------------------------------------------- Tricuspid valve:   Doppler:  There was mild regurgitation.  ------------------------------------------------------------------- Pulmonary artery:   The main pulmonary artery was normal-sized.   ------------------------------------------------------------------- Pericardium:  There was no pericardial effusion.  ------------------------------------------------------------------- Systemic veins: Inferior vena cava: The vessel was normal in size. The respirophasic diameter changes were in the normal range (>= 50%), consistent with normal central venous pressure.  ------------------------------------------------------------------- Post procedure conclusions Ascending Aorta:  - Mildly dilated aortic root.  ------------------------------------------------------------------- Measurements   Left ventricle                          Value        Reference  LV ID, ED, PLAX chordal         (H)     54.7  mm     43 - 52  LV ID, ES, PLAX chordal                 37.6  mm     23 - 38  LV fx shortening, PLAX chordal          31    %      >=29  LV PW thickness, ED                     7.96  mm     ----------  IVS/LV PW ratio, ED             (H)     1.39         <=1.3  LV e&', lateral                          5.09  cm/s   ----------  LV E/e&', lateral                        6.78         ----------  LV e&', medial                           5.15  cm/s   ----------  LV  E/e&', medial                         6.7          ----------  LV e&', average                          5.12  cm/s   ----------  LV E/e&', average                        6.74         ----------    Ventricular septum                      Value        Reference  IVS thickness, ED                        11.1  mm     ----------    LVOT                                    Value        Reference  LVOT ID, S                              19    mm     ----------  LVOT area                               2.84  cm^2   ----------    Aorta                                   Value        Reference  Aortic root ID, ED                      39    mm     ----------  Ascending aorta ID, A-P, S              34    mm     ----------    Left atrium                             Value        Reference  LA ID, A-P, ES                          35    mm     ----------  LA ID/bsa, A-P                          1.62  cm/m^2 <=2.2  LA volume, S                            39.2  ml     ----------  LA volume/bsa, S  18.1  ml/m^2 ----------  LA volume, ES, 1-p A4C                  32.2  ml     ----------  LA volume/bsa, ES, 1-p A4C              14.9  ml/m^2 ----------  LA volume, ES, 1-p A2C                  40.2  ml     ----------  LA volume/bsa, ES, 1-p A2C              18.6  ml/m^2 ----------    Mitral valve                            Value        Reference  Mitral E-wave peak velocity             34.5  cm/s   ----------  Mitral A-wave peak velocity             68.7  cm/s   ----------  Mitral mean velocity, D                 40.9  cm/s   ----------  Mitral deceleration time        (H)     299   ms     150 - 230  Mitral mean gradient, D                 1     mm Hg  ----------  Mitral E/A ratio, peak                  0.5          ----------  Mitral annulus VTI, D                   18.4  cm     ----------    Pulmonary arteries                      Value        Reference  PA pressure, S, DP              (H)     32    mm Hg  <=30    Tricuspid valve                         Value        Reference  Tricuspid regurg peak velocity          270   cm/s   ----------  Tricuspid peak RV-RA gradient           29    mm Hg  ----------    Right atrium                            Value         Reference  RA ID, S-I, ES, A4C             (H)     56    mm     34 - 49  RA area, ES, A4C  15    cm^2   8.3 - 19.5  RA volume, ES, A/L                      32.5  ml     ----------  RA volume/bsa, ES, A/L                  15    ml/m^2 ----------    Systemic veins                          Value        Reference  Estimated CVP                           3     mm Hg  ----------    Right ventricle                         Value        Reference  RV ID, minor axis, ED, A4C base         31.6  mm     ----------  RV ID, minor axis, ED, A4C mid          20.6  mm     ----------  RV ID, major axis, ED, A4C      (L)     54.5  mm     55 - 91  TAPSE                                   12.4  mm     ----------  RV pressure, S, DP              (H)     32    mm Hg  <=30  RV s&', lateral, S                       5.33  cm/s   ----------  Legend: (L)  and  (H)  mark values outside specified reference range.  ------------------------------------------------------------------- Prepared and Electronically Authenticated by  Lyman Bishop MD 2018-06-26T15:18:16    Assessment/Plan:  I have personally reviewed and interpreted his echo and cath images. This active 83 year old gentleman has 60% calcified distal LM and 95% proximal to mid LAD stenosis that is calcified with preserved LV function and presents with stuttering angina and a NSTEMI with minimally elevated troponin. I agree that CABG is the best treatment for this patient given the location and calcification, anatomy of his stenoses.  I discussed the operative procedure with the patient and his daughter who is a physician by phone including alternatives, benefits and risks; including but not limited to bleeding, blood transfusion, infection, stroke, myocardial infarction, graft failure, heart block requiring a permanent pacemaker, organ dysfunction, and death.  [name] understands and agrees to proceed.  We will schedule surgery  for Friday afternoon this week. Continue heparin and NTG until then.  I spent 60 minutes performing this consultation and > 50% of this time was spent face to face counseling and coordinating the care of this patient's coronary artery disease.  Gaye Pollack 08/11/2016

## 2016-08-13 ENCOUNTER — Inpatient Hospital Stay (HOSPITAL_COMMUNITY): Payer: Medicare Other

## 2016-08-13 DIAGNOSIS — Z01810 Encounter for preprocedural cardiovascular examination: Secondary | ICD-10-CM

## 2016-08-13 LAB — TYPE AND SCREEN
ABO/RH(D): A POS
Antibody Screen: NEGATIVE

## 2016-08-13 LAB — CBC
HCT: 38.7 % — ABNORMAL LOW (ref 39.0–52.0)
Hemoglobin: 12.6 g/dL — ABNORMAL LOW (ref 13.0–17.0)
MCH: 30.5 pg (ref 26.0–34.0)
MCHC: 32.6 g/dL (ref 30.0–36.0)
MCV: 93.7 fL (ref 78.0–100.0)
Platelets: 191 10*3/uL (ref 150–400)
RBC: 4.13 MIL/uL — ABNORMAL LOW (ref 4.22–5.81)
RDW: 12.7 % (ref 11.5–15.5)
WBC: 6.1 10*3/uL (ref 4.0–10.5)

## 2016-08-13 LAB — SURGICAL PCR SCREEN
MRSA, PCR: NEGATIVE
Staphylococcus aureus: NEGATIVE

## 2016-08-13 LAB — HEPARIN LEVEL (UNFRACTIONATED): Heparin Unfractionated: 0.74 IU/mL — ABNORMAL HIGH (ref 0.30–0.70)

## 2016-08-13 MED ORDER — CHLORHEXIDINE GLUCONATE 0.12 % MT SOLN
15.0000 mL | Freq: Once | OROMUCOSAL | Status: AC
  Administered 2016-08-14: 15 mL via OROMUCOSAL
  Filled 2016-08-13: qty 15

## 2016-08-13 MED ORDER — TRANEXAMIC ACID (OHS) BOLUS VIA INFUSION
15.0000 mg/kg | INTRAVENOUS | Status: AC
  Administered 2016-08-14: 1378.5 mg via INTRAVENOUS
  Filled 2016-08-13: qty 1379

## 2016-08-13 MED ORDER — SODIUM CHLORIDE 0.9 % IV SOLN
INTRAVENOUS | Status: DC
  Filled 2016-08-13: qty 30

## 2016-08-13 MED ORDER — BISACODYL 5 MG PO TBEC
5.0000 mg | DELAYED_RELEASE_TABLET | Freq: Once | ORAL | Status: AC
  Administered 2016-08-13: 5 mg via ORAL
  Filled 2016-08-13: qty 1

## 2016-08-13 MED ORDER — NITROGLYCERIN IN D5W 200-5 MCG/ML-% IV SOLN
2.0000 ug/min | INTRAVENOUS | Status: DC
Start: 2016-08-14 — End: 2016-08-14
  Filled 2016-08-13: qty 250

## 2016-08-13 MED ORDER — DEXTROSE 5 % IV SOLN
1.5000 g | INTRAVENOUS | Status: AC
  Administered 2016-08-14: 1.5 g via INTRAVENOUS
  Administered 2016-08-14: .75 g via INTRAVENOUS
  Filled 2016-08-13: qty 1.5

## 2016-08-13 MED ORDER — METOPROLOL TARTRATE 12.5 MG HALF TABLET
12.5000 mg | ORAL_TABLET | Freq: Once | ORAL | Status: AC
  Administered 2016-08-14: 12.5 mg via ORAL
  Filled 2016-08-13: qty 1

## 2016-08-13 MED ORDER — SODIUM CHLORIDE 0.9 % IV SOLN
INTRAVENOUS | Status: AC
  Administered 2016-08-14: 1 [IU]/h via INTRAVENOUS
  Filled 2016-08-13: qty 1

## 2016-08-13 MED ORDER — VANCOMYCIN HCL 10 G IV SOLR
1500.0000 mg | INTRAVENOUS | Status: AC
  Administered 2016-08-14: 1500 mg via INTRAVENOUS
  Filled 2016-08-13: qty 1500

## 2016-08-13 MED ORDER — TEMAZEPAM 15 MG PO CAPS
15.0000 mg | ORAL_CAPSULE | Freq: Once | ORAL | Status: AC | PRN
  Administered 2016-08-13: 15 mg via ORAL
  Filled 2016-08-13: qty 1

## 2016-08-13 MED ORDER — POTASSIUM CHLORIDE 2 MEQ/ML IV SOLN
80.0000 meq | INTRAVENOUS | Status: DC
  Filled 2016-08-13: qty 40

## 2016-08-13 MED ORDER — DOPAMINE-DEXTROSE 3.2-5 MG/ML-% IV SOLN
0.0000 ug/kg/min | INTRAVENOUS | Status: DC
  Filled 2016-08-13: qty 250

## 2016-08-13 MED ORDER — CHLORHEXIDINE GLUCONATE CLOTH 2 % EX PADS
6.0000 | MEDICATED_PAD | Freq: Once | CUTANEOUS | Status: AC
  Administered 2016-08-14: 6 via TOPICAL

## 2016-08-13 MED ORDER — MAGNESIUM SULFATE 50 % IJ SOLN
40.0000 meq | INTRAMUSCULAR | Status: DC
  Filled 2016-08-13: qty 10

## 2016-08-13 MED ORDER — DEXTROSE 5 % IV SOLN
750.0000 mg | INTRAVENOUS | Status: DC
  Filled 2016-08-13: qty 750

## 2016-08-13 MED ORDER — DEXMEDETOMIDINE HCL IN NACL 400 MCG/100ML IV SOLN
0.1000 ug/kg/h | INTRAVENOUS | Status: AC
Start: 2016-08-14 — End: 2016-08-14
  Administered 2016-08-14: 0.7 ug/kg/h via INTRAVENOUS
  Filled 2016-08-13: qty 100

## 2016-08-13 MED ORDER — EPINEPHRINE PF 1 MG/ML IJ SOLN
0.0000 ug/min | INTRAVENOUS | Status: DC
  Filled 2016-08-13: qty 4

## 2016-08-13 MED ORDER — DIAZEPAM 5 MG PO TABS
5.0000 mg | ORAL_TABLET | Freq: Once | ORAL | Status: AC
  Administered 2016-08-14: 5 mg via ORAL
  Filled 2016-08-13: qty 1

## 2016-08-13 MED ORDER — PLASMA-LYTE 148 IV SOLN
INTRAVENOUS | Status: AC
  Administered 2016-08-14: 500 mL
  Filled 2016-08-13: qty 2.5

## 2016-08-13 MED ORDER — PHENYLEPHRINE HCL 10 MG/ML IJ SOLN
30.0000 ug/min | INTRAMUSCULAR | Status: DC
  Filled 2016-08-13: qty 2

## 2016-08-13 MED ORDER — TRANEXAMIC ACID 1000 MG/10ML IV SOLN
1.5000 mg/kg/h | INTRAVENOUS | Status: AC
  Administered 2016-08-14: 1.5 mg/kg/h via INTRAVENOUS
  Filled 2016-08-13: qty 25

## 2016-08-13 MED ORDER — TRANEXAMIC ACID (OHS) PUMP PRIME SOLUTION
2.0000 mg/kg | INTRAVENOUS | Status: DC
  Filled 2016-08-13: qty 1.84

## 2016-08-13 NOTE — Progress Notes (Signed)
[place] for heparin Indication: chest pain/ACS  Allergies  Allergen Reactions  . No Known Allergies     Patient Measurements: Height: 5\' 11"  (180.3 cm) Weight: 202 lb 11.2 oz (91.9 kg) IBW/kg (Calculated) : 75.3 Heparin Dosing Weight: 89.4 kg  Vital Signs: Temp: 97.5 F (36.4 C) (06/28 0851) Temp Source: Oral (06/28 0851) BP: 102/60 (06/28 0851) Pulse Rate: 66 (06/28 0851)  Labs:  Recent Labs  08/11/16 0516 08/11/16 1033 08/11/16 1543 08/12/16 0520 08/12/16 1011 08/13/16 0437  HGB 12.9*  --   --  13.3  --  12.6*  HCT 39.3  --   --  40.5  --  38.7*  PLT 172  --   --  171  --  191  LABPROT 13.6  --   --   --   --   --   INR 1.04  --   --   --   --   --   HEPARINUNFRC 0.47  --   --   --  0.30 0.74*  CREATININE 1.20  --   --   --   --   --   TROPONINI 0.07* 0.06* 0.04*  --   --   --     Estimated Creatinine Clearance: 53.1 mL/min (by C-G formula based on SCr of 1.2 mg/dL).    Assessment: 83 yo male admitted with chest pain now s/p cath awaiting possible CABG Heparin level = 0.74  Goal of Therapy:  Heparin level 0.3-0.7 units/ml Monitor platelets by anticoagulation protocol: Yes   Plan:  Heparin to 1250 units / hr Daily HL, CBC  Thank you Anette Guarneri, PharmD [phone]  08/13/2016,9:23 AM

## 2016-08-13 NOTE — Progress Notes (Signed)
2 Days Post-Op Procedure(s) (LRB): Left Heart Cath and Coronary Angiography (N/A) Ultrasound Guidance For Vascular Access Subjective:  Had some brief chest pain this am when NTG stopped and went to [facility] to have BM.   Objective: Vital signs in last 24 hours: Temp:  [97.5 F (36.4 C)-98.5 F (36.9 C)] 98 F (36.7 C) (06/28 1700) Pulse Rate:  [63-84] 65 (06/28 1700) Cardiac Rhythm: Normal sinus rhythm;Heart block (06/28 0705) Resp:  [18-20] 20 (06/28 1700) BP: (102-135)/(60-72) 135/61 (06/28 1700) SpO2:  [93 %-98 %] 95 % (06/28 1700) Weight:  [91.9 kg (202 lb 11.2 oz)] 91.9 kg (202 lb 11.2 oz) (06/28 1624)  Hemodynamic parameters for last 24 hours:    Intake/Output from previous day: 06/27 0701 - 06/28 0700 In: 930.9 [P.O.:480; I.V.:450.9] Out: 1150 [Urine:1150] Intake/Output this shift: No intake/output data recorded.    Lab Results:  Recent Labs  08/12/16 0520 08/13/16 0437  WBC 7.1 6.1  HGB 13.3 12.6*  HCT 40.5 38.7*  PLT 171 191   BMET:  Recent Labs  08/11/16 0516  NA 138  K 4.2  CL 108  CO2 23  GLUCOSE 111*  BUN 21*  CREATININE 1.20  CALCIUM 8.5*    PT/INR:  Recent Labs  08/11/16 0516  LABPROT 13.6  INR 1.04   ABG No results found for: PHART, HCO3, TCO2, ACIDBASEDEF, O2SAT CBG (last 3)  No results for input(s): GLUCAP in the last 72 hours.  Assessment/Plan:  Multivessel CAD s/p NSTEMI with unstable angina. Plan CABG tomorrow. Pt has no further questions.  LOS: 3 days    Gaye Pollack 08/13/2016

## 2016-08-13 NOTE — Progress Notes (Signed)
Progress Note  Patient Name: Marc Gilbert Date of Encounter: 08/13/2016  Primary Cardiologist:   (New) Dr. Percival Spanish  Subjective   He did have some chest pain this morning while trying to have a BM and his NTG has been interrupted.   Gouty toe pain is improved.    Inpatient Medications    Scheduled Meds: . aspirin  81 mg Oral Daily  . atorvastatin  80 mg Oral q1800  . colchicine  0.6 mg Oral BID  . metoprolol tartrate  12.5 mg Oral BID  .  morphine injection  4 mg Intravenous Once  . predniSONE  10 mg Oral Nightly  . predniSONE  5 mg Oral 3 x daily with food  . [START ON 08/14/2016] predniSONE  5 mg Oral 4X daily taper  . psyllium  1 packet Oral BID  . sodium chloride flush  3 mL Intravenous Q12H  . tamsulosin  0.8 mg Oral Daily   Continuous Infusions: . sodium chloride    . heparin 1,350 Units/hr (08/12/16 1446)  . nitroGLYCERIN 10 mcg/min (08/12/16 0454)   PRN Meds: sodium chloride, acetaminophen, acetaminophen, ALPRAZolam, magnesium hydroxide, ondansetron (ZOFRAN) IV, ondansetron (ZOFRAN) IV, oxyCODONE-acetaminophen, sodium chloride flush, traMADol   Vital Signs    Vitals:   08/12/16 2001 08/12/16 2330 08/13/16 0613 08/13/16 0851  BP: 118/72 115/66 106/61 102/60  Pulse: 84 74 63 66  Resp: 18 20 18 20   Temp: 98.2 F (36.8 C) 98.5 F (36.9 C) 98.4 F (36.9 C) 97.5 F (36.4 C)  TempSrc: Oral Oral Oral Oral  SpO2: 93% 96% 98% 97%  Weight:   202 lb 11.2 oz (91.9 kg)   Height:        Intake/Output Summary (Last 24 hours) at 08/13/16 1014 Last data filed at 08/13/16 0700  Gross per 24 hour  Intake            690.9 ml  Output             1150 ml  Net           -459.1 ml   Filed Weights   08/11/16 0500 08/12/16 0400 08/13/16 0613  Weight: 203 lb 9.6 oz (92.4 kg) 202 lb 3.2 oz (91.7 kg) 202 lb 11.2 oz (91.9 kg)    Telemetry    NSR - Personally Reviewed  ECG    NA - Personally Reviewed  Physical Exam   GEN: No  acute distress.   Neck: No   JVD Cardiac:  RRR, no murmurs, rubs, or gallops.  Respiratory: Clear   to auscultation bilaterally. GI: Soft, nontender, non-distended, normal bowel sounds  MS:   edema; No deformity. Neuro:   Nonfocal  Psych: Oriented and appropriate    Labs    Chemistry  Recent Labs Lab 08/10/16 0841 08/11/16 0516  NA 136 138  K 4.4 4.2  CL 105 108  CO2 23 23  GLUCOSE 121* 111*  BUN 22* 21*  CREATININE 1.32* 1.20  CALCIUM 8.7* 8.5*  GFRNONAA 48* 54*  GFRAA 55* >60  ANIONGAP 8 7     Hematology  Recent Labs Lab 08/11/16 0516 08/12/16 0520 08/13/16 0437  WBC 6.0 7.1 6.1  RBC 4.22 4.33 4.13*  HGB 12.9* 13.3 12.6*  HCT 39.3 40.5 38.7*  MCV 93.1 93.5 93.7  MCH 30.6 30.7 30.5  MCHC 32.8 32.8 32.6  RDW 12.9 12.7 12.7  PLT 172 171 191    Cardiac Enzymes  Recent Labs Lab 08/11/16 0516 08/11/16 1033 08/11/16  1543  TROPONINI 0.07* 0.06* 0.04*     Recent Labs Lab 08/10/16 0839 08/10/16 1312  TROPIPOC 0.03 0.15*     BNPNo results for input(s): BNP, PROBNP in the last 168 hours.   DDimer No results for input(s): DDIMER in the last 168 hours.   Radiology    No results found.  Cardiac Studies    CATH  08/11/16  Conclusion    Critical obstruction in the proximal to mid LAD, 95%, within a heavily calcified region.  Angiographically significant calcified distal left main, 60%.  Greater than 90% small first obtuse marginal  30% mid circumflex  Widely patent, dominant, RCA.  Normal left ventricular systolic function with moderate mid to distal anterior wall hypokinesis. EF 45-50%. Mildly elevated LVEDP consistent with acute diastolic heart failure.      Patient Profile     83 y.o. male with a PMH significant for HTN, HLD, GERD, COPD, CKD stage III, and chronic back pain. He reported to the [facility] with new onset chest pain.  Found to have LM and severe LAD disease.  For CABG.   Assessment & Plan    CAD:   Chest pain this AM.  I have suggested that he not  ambulate out of the room today.  Continue current therapy.  CABG tomorrow  CKD:   Creat was OK yesterday.   I will repeat for the AM.   Ischemic cardiomyopathy:    Mildly reduced EF.   We will titrate meds post op.    Gout:   He received a dose pack of steroid and is doing better.     Signed, Minus Breeding, MD  08/13/2016, 10:14 AM

## 2016-08-13 NOTE — Progress Notes (Signed)
Pre-op Cardiac Surgery  Carotid Findings:  1-39% ICA plaquing.  Vertebral artery flow is antegrade.  Upper Extremity Right Left  Brachial Pressures 109T 111T  Radial Waveforms T   Ulnar Waveforms T   Palmar Arch (Allen's Test) WNL Doppler signal remains normal with radial compression and obliterates with radial compression.   Findings:      Lower  Extremity Right Left  Dorsalis Pedis    Anterior Tibial T T  Posterior Tibial T T  Ankle/Brachial Indices      Findings:  WNL

## 2016-08-14 ENCOUNTER — Inpatient Hospital Stay (HOSPITAL_COMMUNITY): Payer: Medicare Other

## 2016-08-14 ENCOUNTER — Inpatient Hospital Stay (HOSPITAL_COMMUNITY): Payer: Medicare Other | Admitting: Anesthesiology

## 2016-08-14 ENCOUNTER — Inpatient Hospital Stay (HOSPITAL_COMMUNITY)
Admission: EM | Disposition: A | Discharge status: Home Health | Payer: Self-pay | Source: Home / Self Care | Attending: Surgery

## 2016-08-14 ENCOUNTER — Other Ambulatory Visit: Payer: Self-pay

## 2016-08-14 DIAGNOSIS — Z951 Presence of aortocoronary bypass graft: Secondary | ICD-10-CM

## 2016-08-14 DIAGNOSIS — I25110 Atherosclerotic heart disease of native coronary artery with unstable angina pectoris: Secondary | ICD-10-CM

## 2016-08-14 HISTORY — PX: CORONARY ARTERY BYPASS GRAFT: SHX141

## 2016-08-14 HISTORY — PX: TEE WITHOUT CARDIOVERSION: SHX5443

## 2016-08-14 LAB — BASIC METABOLIC PANEL
Anion gap: 9 (ref 5–15)
BUN: 23 mg/dL — ABNORMAL HIGH (ref 6–20)
CO2: 23 mmol/L (ref 22–32)
Calcium: 8.9 mg/dL (ref 8.9–10.3)
Chloride: 105 mmol/L (ref 101–111)
Creatinine, Ser: 1.1 mg/dL (ref 0.61–1.24)
GFR calc Af Amer: >60 mL/min (ref >60)
GFR calc non Af Amer: 60 mL/min — ABNORMAL LOW (ref >60)
Glucose, Bld: 130 mg/dL — ABNORMAL HIGH (ref 65–99)
Potassium: 4.6 mmol/L (ref 3.5–5.1)
Sodium: 137 mmol/L (ref 135–145)

## 2016-08-14 LAB — POCT I-STAT 3, ART BLOOD GAS (G3+)
Acid-base deficit: 2 mmol/L (ref 0.0–2.0)
Acid-base deficit: 5 mmol/L — ABNORMAL HIGH (ref 0.0–2.0)
Acid-base deficit: 6 mmol/L — ABNORMAL HIGH (ref 0.0–2.0)
Bicarbonate: 18.8 mmol/L — ABNORMAL LOW (ref 20.0–28.0)
Bicarbonate: 19.9 mmol/L — ABNORMAL LOW (ref 20.0–28.0)
Bicarbonate: 22.9 mmol/L (ref 20.0–28.0)
O2 Saturation: 96 %
O2 Saturation: 96 %
O2 Saturation: 99 %
Patient temperature: 35.8
Patient temperature: 37.2
Patient temperature: 37.2
TCO2: 20 mmol/L (ref 0–100)
TCO2: 21 mmol/L (ref 0–100)
TCO2: 24 mmol/L (ref 0–100)
pCO2 arterial: 34.8 mmHg (ref 32.0–48.0)
pCO2 arterial: 37.1 mmHg (ref 32.0–48.0)
pCO2 arterial: 37.6 mmHg (ref 32.0–48.0)
pH, Arterial: 7.338 — ABNORMAL LOW (ref 7.350–7.450)
pH, Arterial: 7.341 — ABNORMAL LOW (ref 7.350–7.450)
pH, Arterial: 7.387 (ref 7.350–7.450)
pO2, Arterial: 155 mmHg — ABNORMAL HIGH (ref 83.0–108.0)
pO2, Arterial: 87 mmHg (ref 83.0–108.0)
pO2, Arterial: 87 mmHg (ref 83.0–108.0)

## 2016-08-14 LAB — GLUCOSE, CAPILLARY
Glucose-Capillary: 103 mg/dL — ABNORMAL HIGH (ref 65–99)
Glucose-Capillary: 104 mg/dL — ABNORMAL HIGH (ref 65–99)
Glucose-Capillary: 146 mg/dL — ABNORMAL HIGH (ref 65–99)
Glucose-Capillary: 150 mg/dL — ABNORMAL HIGH (ref 65–99)

## 2016-08-14 LAB — HEPARIN LEVEL (UNFRACTIONATED): Heparin Unfractionated: 0.86 IU/mL — ABNORMAL HIGH (ref 0.30–0.70)

## 2016-08-14 LAB — CBC
HCT: 39.6 % (ref 39.0–52.0)
HCT: 33.9 % — ABNORMAL LOW (ref 39.0–52.0)
HCT: 35.2 % — ABNORMAL LOW (ref 39.0–52.0)
Hemoglobin: 13 g/dL (ref 13.0–17.0)
Hemoglobin: 10.9 g/dL — ABNORMAL LOW (ref 13.0–17.0)
Hemoglobin: 11.3 g/dL — ABNORMAL LOW (ref 13.0–17.0)
MCH: 30.8 pg (ref 26.0–34.0)
MCH: 30.3 pg (ref 26.0–34.0)
MCH: 30.5 pg (ref 26.0–34.0)
MCHC: 32.8 g/dL (ref 30.0–36.0)
MCHC: 32.2 g/dL (ref 30.0–36.0)
MCHC: 32.1 g/dL (ref 30.0–36.0)
MCV: 93.8 fL (ref 78.0–100.0)
MCV: 94.2 fL (ref 78.0–100.0)
MCV: 94.9 fL (ref 78.0–100.0)
Platelets: 215 10*3/uL (ref 150–400)
Platelets: 120 10*3/uL — ABNORMAL LOW (ref 150–400)
Platelets: 137 10*3/uL — ABNORMAL LOW (ref 150–400)
RBC: 4.22 MIL/uL (ref 4.22–5.81)
RBC: 3.6 MIL/uL — ABNORMAL LOW (ref 4.22–5.81)
RBC: 3.71 MIL/uL — ABNORMAL LOW (ref 4.22–5.81)
RDW: 12.9 % (ref 11.5–15.5)
RDW: 12.9 % (ref 11.5–15.5)
RDW: 13.1 % (ref 11.5–15.5)
WBC: 6.5 10*3/uL (ref 4.0–10.5)
WBC: 9 10*3/uL (ref 4.0–10.5)
WBC: 10.3 10*3/uL (ref 4.0–10.5)

## 2016-08-14 LAB — POCT I-STAT, CHEM 8
BUN: 19 mg/dL (ref 6–20)
Calcium, Ion: 1.13 mmol/L — ABNORMAL LOW (ref 1.15–1.40)
Chloride: 106 mmol/L (ref 101–111)
Creatinine, Ser: 1.2 mg/dL (ref 0.61–1.24)
Glucose, Bld: 132 mg/dL — ABNORMAL HIGH (ref 65–99)
HCT: 33 % — ABNORMAL LOW (ref 39.0–52.0)
Hemoglobin: 11.2 g/dL — ABNORMAL LOW (ref 13.0–17.0)
Potassium: 4.6 mmol/L (ref 3.5–5.1)
Sodium: 139 mmol/L (ref 135–145)
TCO2: 20 mmol/L (ref 0–100)

## 2016-08-14 LAB — CREATININE, SERUM
Creatinine, Ser: 1.31 mg/dL — ABNORMAL HIGH (ref 0.61–1.24)
GFR calc Af Amer: 56 mL/min — ABNORMAL LOW (ref >60)
GFR calc non Af Amer: 48 mL/min — ABNORMAL LOW (ref >60)

## 2016-08-14 LAB — HEMOGLOBIN AND HEMATOCRIT, BLOOD
HCT: 29.1 % — ABNORMAL LOW (ref 39.0–52.0)
Hemoglobin: 9.7 g/dL — ABNORMAL LOW (ref 13.0–17.0)

## 2016-08-14 LAB — POCT I-STAT 4, (NA,K, GLUC, HGB,HCT)
Glucose, Bld: 119 mg/dL — ABNORMAL HIGH (ref 65–99)
HCT: 29 % — ABNORMAL LOW (ref 39.0–52.0)
Hemoglobin: 9.9 g/dL — ABNORMAL LOW (ref 13.0–17.0)
Potassium: 4.7 mmol/L (ref 3.5–5.1)
Sodium: 139 mmol/L (ref 135–145)

## 2016-08-14 LAB — MAGNESIUM: Magnesium: 2.6 mg/dL — ABNORMAL HIGH (ref 1.7–2.4)

## 2016-08-14 LAB — APTT: aPTT: 31 seconds (ref 24–36)

## 2016-08-14 LAB — PROTIME-INR
INR: 1.39
Prothrombin Time: 17.2 seconds — ABNORMAL HIGH (ref 11.4–15.2)

## 2016-08-14 LAB — PLATELET COUNT: Platelets: 155 10*3/uL (ref 150–400)

## 2016-08-14 SURGERY — CORONARY ARTERY BYPASS GRAFTING (CABG)
Anesthesia: General | Site: Chest

## 2016-08-14 MED ORDER — SODIUM CHLORIDE 0.9% FLUSH: 3.0000 mL | INTRAVENOUS | Status: DC | PRN

## 2016-08-14 MED ORDER — LIDOCAINE 2% (20 MG/ML) 5 ML SYRINGE
INTRAMUSCULAR | Status: DC | PRN
  Administered 2016-08-14: 25 mg via INTRAVENOUS

## 2016-08-14 MED ORDER — LACTATED RINGERS IV SOLN: INTRAVENOUS | Status: DC

## 2016-08-14 MED ORDER — POTASSIUM CHLORIDE 10 MEQ/50ML IV SOLN: 10.0000 meq | INTRAVENOUS | Status: AC

## 2016-08-14 MED ORDER — INSULIN ASPART 100 UNIT/ML ~~LOC~~ SOLN
0.0000 [IU] | SUBCUTANEOUS | Status: DC
Start: 2016-08-14 — End: 2016-08-15
  Administered 2016-08-14 – 2016-08-15 (×5): 2 [IU] via SUBCUTANEOUS

## 2016-08-14 MED ORDER — BISACODYL 5 MG PO TBEC
10.0000 mg | DELAYED_RELEASE_TABLET | Freq: Every day | ORAL | Status: DC
  Administered 2016-08-15 – 2016-08-17 (×3): 10 mg via ORAL
  Filled 2016-08-14 (×3): qty 2

## 2016-08-14 MED ORDER — BISACODYL 10 MG RE SUPP: 10.0000 mg | Freq: Every day | RECTAL | Status: DC

## 2016-08-14 MED ORDER — FENTANYL CITRATE (PF) 250 MCG/5ML IJ SOLN
INTRAMUSCULAR | Status: AC
  Filled 2016-08-14: qty 10

## 2016-08-14 MED ORDER — LACTATED RINGERS IV SOLN
INTRAVENOUS | Status: DC
  Administered 2016-08-15: 19:00:00 via INTRAVENOUS

## 2016-08-14 MED ORDER — ONDANSETRON HCL 4 MG/2ML IJ SOLN: 4.0000 mg | Freq: Four times a day (QID) | INTRAMUSCULAR | Status: DC | PRN

## 2016-08-14 MED ORDER — METOPROLOL TARTRATE 25 MG/10 ML ORAL SUSPENSION: 12.5000 mg | Freq: Two times a day (BID) | ORAL | Status: DC

## 2016-08-14 MED ORDER — ALBUMIN HUMAN 5 % IV SOLN
INTRAVENOUS | Status: DC | PRN
  Administered 2016-08-14: 15:00:00 via INTRAVENOUS

## 2016-08-14 MED ORDER — MORPHINE SULFATE (PF) 4 MG/ML IV SOLN: 1.0000 mg | INTRAVENOUS | Status: AC | PRN

## 2016-08-14 MED ORDER — PHENYLEPHRINE HCL 10 MG/ML IJ SOLN
0.0000 ug/min | INTRAMUSCULAR | Status: DC
  Filled 2016-08-14: qty 2

## 2016-08-14 MED ORDER — MIDAZOLAM HCL 5 MG/5ML IJ SOLN
INTRAMUSCULAR | Status: DC | PRN
  Administered 2016-08-14: 1 mg via INTRAVENOUS
  Administered 2016-08-14: 4 mg via INTRAVENOUS
  Administered 2016-08-14: 2 mg via INTRAVENOUS
  Administered 2016-08-14: 4 mg via INTRAVENOUS
  Administered 2016-08-14: 1 mg via INTRAVENOUS
  Administered 2016-08-14: 2 mg via INTRAVENOUS

## 2016-08-14 MED ORDER — TAMSULOSIN HCL 0.4 MG PO CAPS
0.8000 mg | ORAL_CAPSULE | Freq: Every day | ORAL | Status: DC
  Administered 2016-08-15 – 2016-08-18 (×4): 0.8 mg via ORAL
  Filled 2016-08-14 (×5): qty 2

## 2016-08-14 MED ORDER — DEXTROSE 5 % IV SOLN
1.5000 g | Freq: Two times a day (BID) | INTRAVENOUS | Status: AC
  Administered 2016-08-14 – 2016-08-16 (×4): 1.5 g via INTRAVENOUS
  Filled 2016-08-14 (×5): qty 1.5

## 2016-08-14 MED ORDER — LACTATED RINGERS IV SOLN
500.0000 mL | Freq: Once | INTRAVENOUS | Status: AC | PRN
  Administered 2016-08-14: 500 mL via INTRAVENOUS

## 2016-08-14 MED ORDER — ACETAMINOPHEN 650 MG RE SUPP
650.0000 mg | Freq: Once | RECTAL | Status: AC
  Administered 2016-08-14: 650 mg via RECTAL

## 2016-08-14 MED ORDER — 0.9 % SODIUM CHLORIDE (POUR BTL) OPTIME
TOPICAL | Status: DC | PRN
  Administered 2016-08-14: 6000 mL

## 2016-08-14 MED ORDER — HEPARIN SODIUM (PORCINE) 1000 UNIT/ML IJ SOLN
INTRAMUSCULAR | Status: DC | PRN
  Administered 2016-08-14: 49000 [IU] via INTRAVENOUS

## 2016-08-14 MED ORDER — THROMBIN 20000 UNITS EX SOLR
CUTANEOUS | Status: AC
  Filled 2016-08-14: qty 20000

## 2016-08-14 MED ORDER — INSULIN REGULAR BOLUS VIA INFUSION
0.0000 [IU] | Freq: Three times a day (TID) | INTRAVENOUS | Status: DC
  Filled 2016-08-14: qty 10

## 2016-08-14 MED ORDER — CHLORHEXIDINE GLUCONATE CLOTH 2 % EX PADS
6.0000 | MEDICATED_PAD | Freq: Every day | CUTANEOUS | Status: DC
  Administered 2016-08-14: 6 via TOPICAL

## 2016-08-14 MED ORDER — ORAL CARE MOUTH RINSE
15.0000 mL | Freq: Two times a day (BID) | OROMUCOSAL | Status: DC
  Administered 2016-08-15 – 2016-08-16 (×2): 15 mL via OROMUCOSAL

## 2016-08-14 MED ORDER — PHENYLEPHRINE HCL 10 MG/ML IJ SOLN
INTRAVENOUS | Status: DC | PRN
  Administered 2016-08-14: 40 ug/min via INTRAVENOUS

## 2016-08-14 MED ORDER — PANTOPRAZOLE SODIUM 40 MG PO TBEC: 40.0000 mg | DELAYED_RELEASE_TABLET | Freq: Every day | ORAL | Status: DC

## 2016-08-14 MED ORDER — SODIUM CHLORIDE 0.45 % IV SOLN
INTRAVENOUS | Status: DC | PRN
  Administered 2016-08-14: 16:00:00 via INTRAVENOUS

## 2016-08-14 MED ORDER — SODIUM CHLORIDE 0.9% FLUSH
10.0000 mL | Freq: Two times a day (BID) | INTRAVENOUS | Status: DC
  Administered 2016-08-14: 10 mL

## 2016-08-14 MED ORDER — FENTANYL CITRATE (PF) 250 MCG/5ML IJ SOLN
INTRAMUSCULAR | Status: AC
  Filled 2016-08-14: qty 5

## 2016-08-14 MED ORDER — ROCURONIUM BROMIDE 10 MG/ML (PF) SYRINGE
PREFILLED_SYRINGE | INTRAVENOUS | Status: DC | PRN
  Administered 2016-08-14: 100 mg via INTRAVENOUS
  Administered 2016-08-14: 50 mg via INTRAVENOUS
  Administered 2016-08-14: 100 mg via INTRAVENOUS
  Administered 2016-08-14: 50 mg via INTRAVENOUS

## 2016-08-14 MED ORDER — CHLORHEXIDINE GLUCONATE 0.12% ORAL RINSE (MEDLINE KIT)
15.0000 mL | Freq: Two times a day (BID) | OROMUCOSAL | Status: DC
  Administered 2016-08-14: 15 mL via OROMUCOSAL

## 2016-08-14 MED ORDER — ALPRAZOLAM 0.25 MG PO TABS
0.2500 mg | ORAL_TABLET | Freq: Every evening | ORAL | Status: DC | PRN
  Administered 2016-08-15 – 2016-08-18 (×4): 0.25 mg via ORAL
  Filled 2016-08-14 (×5): qty 1

## 2016-08-14 MED ORDER — DOCUSATE SODIUM 100 MG PO CAPS
200.0000 mg | ORAL_CAPSULE | Freq: Every day | ORAL | Status: DC
  Administered 2016-08-15 – 2016-08-17 (×3): 200 mg via ORAL
  Filled 2016-08-14 (×3): qty 2

## 2016-08-14 MED ORDER — CHLORHEXIDINE GLUCONATE 0.12 % MT SOLN
15.0000 mL | OROMUCOSAL | Status: AC
  Administered 2016-08-14: 15 mL via OROMUCOSAL
  Filled 2016-08-14: qty 15

## 2016-08-14 MED ORDER — LACTATED RINGERS IV SOLN
INTRAVENOUS | Status: DC
Start: 2016-08-14 — End: 2016-08-14
  Administered 2016-08-14 (×3): via INTRAVENOUS

## 2016-08-14 MED ORDER — SODIUM CHLORIDE 0.9% FLUSH
3.0000 mL | Freq: Two times a day (BID) | INTRAVENOUS | Status: DC
  Administered 2016-08-15 – 2016-08-17 (×3): 3 mL via INTRAVENOUS

## 2016-08-14 MED ORDER — VANCOMYCIN HCL IN DEXTROSE 1-5 GM/200ML-% IV SOLN
1000.0000 mg | Freq: Once | INTRAVENOUS | Status: AC
  Administered 2016-08-15: 1000 mg via INTRAVENOUS
  Filled 2016-08-14 (×2): qty 200

## 2016-08-14 MED ORDER — FAMOTIDINE IN NACL 20-0.9 MG/50ML-% IV SOLN
20.0000 mg | Freq: Two times a day (BID) | INTRAVENOUS | Status: AC
  Administered 2016-08-14 (×2): 20 mg via INTRAVENOUS
  Filled 2016-08-14: qty 50

## 2016-08-14 MED ORDER — SODIUM CHLORIDE 0.9 % IV SOLN: 250.0000 mL | INTRAVENOUS | Status: DC

## 2016-08-14 MED ORDER — MAGNESIUM SULFATE 4 GM/100ML IV SOLN
4.0000 g | Freq: Once | INTRAVENOUS | Status: AC
  Administered 2016-08-14: 4 g via INTRAVENOUS
  Filled 2016-08-14: qty 100

## 2016-08-14 MED ORDER — ASPIRIN EC 325 MG PO TBEC
325.0000 mg | DELAYED_RELEASE_TABLET | Freq: Every day | ORAL | Status: DC
  Administered 2016-08-15 – 2016-08-17 (×3): 325 mg via ORAL
  Filled 2016-08-14 (×3): qty 1

## 2016-08-14 MED ORDER — NITROGLYCERIN IN D5W 200-5 MCG/ML-% IV SOLN
INTRAVENOUS | Status: DC | PRN
  Administered 2016-08-14: 5 ug/min via INTRAVENOUS

## 2016-08-14 MED ORDER — NITROGLYCERIN IN D5W 200-5 MCG/ML-% IV SOLN: 0.0000 ug/min | INTRAVENOUS | Status: DC

## 2016-08-14 MED ORDER — SODIUM CHLORIDE 0.9% FLUSH
10.0000 mL | Freq: Two times a day (BID) | INTRAVENOUS | Status: DC
  Administered 2016-08-15 – 2016-08-16 (×3): 10 mL

## 2016-08-14 MED ORDER — OXYCODONE HCL 5 MG PO TABS
5.0000 mg | ORAL_TABLET | ORAL | Status: DC | PRN
  Administered 2016-08-15 (×2): 10 mg via ORAL
  Filled 2016-08-14 (×3): qty 2

## 2016-08-14 MED ORDER — TRAMADOL HCL 50 MG PO TABS: 50.0000 mg | ORAL_TABLET | ORAL | Status: DC | PRN

## 2016-08-14 MED ORDER — SODIUM CHLORIDE 0.9% FLUSH: 10.0000 mL | INTRAVENOUS | Status: DC | PRN

## 2016-08-14 MED ORDER — ACETAMINOPHEN 160 MG/5ML PO SOLN: 650.0000 mg | Freq: Once | ORAL | Status: AC

## 2016-08-14 MED ORDER — ALBUMIN HUMAN 5 % IV SOLN
250.0000 mL | INTRAVENOUS | Status: AC | PRN
  Administered 2016-08-14 (×3): 250 mL via INTRAVENOUS
  Filled 2016-08-14 (×2): qty 250

## 2016-08-14 MED ORDER — SUCCINYLCHOLINE CHLORIDE 20 MG/ML IJ SOLN
INTRAMUSCULAR | Status: DC | PRN
  Administered 2016-08-14: 120 mg via INTRAVENOUS

## 2016-08-14 MED ORDER — PRAVASTATIN SODIUM 40 MG PO TABS
40.0000 mg | ORAL_TABLET | Freq: Every day | ORAL | Status: DC
  Administered 2016-08-15 – 2016-08-18 (×4): 40 mg via ORAL
  Filled 2016-08-14 (×4): qty 1

## 2016-08-14 MED ORDER — METOPROLOL TARTRATE 5 MG/5ML IV SOLN
2.5000 mg | INTRAVENOUS | Status: DC | PRN
  Administered 2016-08-15: 5 mg via INTRAVENOUS
  Filled 2016-08-14: qty 5

## 2016-08-14 MED ORDER — FENTANYL CITRATE (PF) 100 MCG/2ML IJ SOLN
INTRAMUSCULAR | Status: AC
  Filled 2016-08-14: qty 2

## 2016-08-14 MED ORDER — THROMBIN 20000 UNITS EX SOLR
CUTANEOUS | Status: DC | PRN
  Administered 2016-08-14: 20000 [IU] via TOPICAL

## 2016-08-14 MED ORDER — MIDAZOLAM HCL 10 MG/2ML IJ SOLN
INTRAMUSCULAR | Status: AC
  Filled 2016-08-14: qty 2

## 2016-08-14 MED ORDER — ORAL CARE MOUTH RINSE: 15.0000 mL | Freq: Four times a day (QID) | OROMUCOSAL | Status: DC

## 2016-08-14 MED ORDER — HEMOSTATIC AGENTS (NO CHARGE) OPTIME
TOPICAL | Status: DC | PRN
  Administered 2016-08-14: 1 via TOPICAL

## 2016-08-14 MED ORDER — ASPIRIN 81 MG PO CHEW: 324.0000 mg | CHEWABLE_TABLET | Freq: Every day | ORAL | Status: DC

## 2016-08-14 MED ORDER — ACETAMINOPHEN 500 MG PO TABS
1000.0000 mg | ORAL_TABLET | Freq: Four times a day (QID) | ORAL | Status: DC
  Administered 2016-08-14 – 2016-08-17 (×9): 1000 mg via ORAL
  Filled 2016-08-14 (×10): qty 2

## 2016-08-14 MED ORDER — PROPOFOL 10 MG/ML IV BOLUS
INTRAVENOUS | Status: AC
  Filled 2016-08-14: qty 20

## 2016-08-14 MED ORDER — SODIUM CHLORIDE 0.9 % IV SOLN
INTRAVENOUS | Status: DC
  Filled 2016-08-14: qty 1

## 2016-08-14 MED ORDER — ACETAMINOPHEN 160 MG/5ML PO SOLN: 1000.0000 mg | Freq: Four times a day (QID) | ORAL | Status: DC

## 2016-08-14 MED ORDER — THROMBIN 20000 UNITS EX SOLR
OROMUCOSAL | Status: DC | PRN
  Administered 2016-08-14: 4 mL via TOPICAL

## 2016-08-14 MED ORDER — CHLORHEXIDINE GLUCONATE CLOTH 2 % EX PADS
6.0000 | MEDICATED_PAD | Freq: Every day | CUTANEOUS | Status: DC
  Administered 2016-08-15 – 2016-08-16 (×2): 6 via TOPICAL

## 2016-08-14 MED ORDER — MIDAZOLAM HCL 2 MG/2ML IJ SOLN: 2.0000 mg | INTRAMUSCULAR | Status: DC | PRN

## 2016-08-14 MED ORDER — METOPROLOL TARTRATE 12.5 MG HALF TABLET: 12.5000 mg | ORAL_TABLET | Freq: Two times a day (BID) | ORAL | Status: DC

## 2016-08-14 MED ORDER — SODIUM CHLORIDE 0.9 % IV SOLN
0.0000 ug/kg/h | INTRAVENOUS | Status: DC
  Filled 2016-08-14: qty 2

## 2016-08-14 MED ORDER — MORPHINE SULFATE (PF) 4 MG/ML IV SOLN
2.0000 mg | INTRAVENOUS | Status: DC | PRN
  Administered 2016-08-14: 1 mg via INTRAVENOUS
  Administered 2016-08-15: 4 mg via INTRAVENOUS
  Administered 2016-08-15: 3 mg via INTRAVENOUS
  Filled 2016-08-14 (×2): qty 1

## 2016-08-14 MED ORDER — PROPOFOL 10 MG/ML IV BOLUS
INTRAVENOUS | Status: DC | PRN
  Administered 2016-08-14: 130 mg via INTRAVENOUS

## 2016-08-14 MED ORDER — FENTANYL CITRATE (PF) 250 MCG/5ML IJ SOLN
INTRAMUSCULAR | Status: DC | PRN
  Administered 2016-08-14 (×4): 50 ug via INTRAVENOUS
  Administered 2016-08-14: 100 ug via INTRAVENOUS
  Administered 2016-08-14: 50 ug via INTRAVENOUS
  Administered 2016-08-14 (×2): 100 ug via INTRAVENOUS
  Administered 2016-08-14 (×3): 50 ug via INTRAVENOUS
  Administered 2016-08-14: 100 ug via INTRAVENOUS
  Administered 2016-08-14: 50 ug via INTRAVENOUS
  Administered 2016-08-14: 100 ug via INTRAVENOUS
  Administered 2016-08-14 (×3): 50 ug via INTRAVENOUS
  Administered 2016-08-14: 250 ug via INTRAVENOUS
  Administered 2016-08-14 (×2): 50 ug via INTRAVENOUS
  Administered 2016-08-14 (×2): 100 ug via INTRAVENOUS
  Administered 2016-08-14 (×2): 50 ug via INTRAVENOUS

## 2016-08-14 MED ORDER — MIDAZOLAM HCL 2 MG/2ML IJ SOLN
INTRAMUSCULAR | Status: AC
  Filled 2016-08-14: qty 2

## 2016-08-14 MED ORDER — SODIUM CHLORIDE 0.9 % IV SOLN: INTRAVENOUS | Status: DC

## 2016-08-14 MED ORDER — MIDAZOLAM HCL 2 MG/2ML IJ SOLN
INTRAMUSCULAR | Status: AC
  Filled 2016-08-14: qty 4

## 2016-08-14 MED ORDER — PROTAMINE SULFATE 10 MG/ML IV SOLN
INTRAVENOUS | Status: DC | PRN
  Administered 2016-08-14: 380 mg via INTRAVENOUS

## 2016-08-14 MED FILL — Magnesium Sulfate Inj 50%: INTRAMUSCULAR | Qty: 10 | Status: AC

## 2016-08-14 MED FILL — Heparin Sodium (Porcine) Inj 1000 Unit/ML: INTRAMUSCULAR | Qty: 30 | Status: AC

## 2016-08-14 MED FILL — Potassium Chloride Inj 2 mEq/ML: INTRAVENOUS | Qty: 10 | Status: AC

## 2016-08-14 SURGICAL SUPPLY — 103 items
BAG DECANTER FOR FLEXI CONT (MISCELLANEOUS) ×3 IMPLANT
BANDAGE ACE 4X5 VEL STRL LF (GAUZE/BANDAGES/DRESSINGS) ×3 IMPLANT
BANDAGE ACE 6X5 VEL STRL LF (GAUZE/BANDAGES/DRESSINGS) ×3 IMPLANT
BASKET HEART (ORDER IN 25'S) (MISCELLANEOUS) ×1
BASKET HEART (ORDER IN 25S) (MISCELLANEOUS) ×2 IMPLANT
BLADE STERNUM SYSTEM 6 (BLADE) ×3 IMPLANT
BLADE SURG 11 STRL SS (BLADE) ×3 IMPLANT
BNDG GAUZE ELAST 4 BULKY (GAUZE/BANDAGES/DRESSINGS) ×3 IMPLANT
CANISTER SUCT 3000ML PPV (MISCELLANEOUS) ×3 IMPLANT
CATH ROBINSON RED A/P 18FR (CATHETERS) ×6 IMPLANT
CATH THORACIC 28FR (CATHETERS) ×3 IMPLANT
CATH THORACIC 36FR (CATHETERS) ×3 IMPLANT
CATH THORACIC 36FR RT ANG (CATHETERS) ×3 IMPLANT
CLIP TI MEDIUM 24 (CLIP) IMPLANT
CLIP TI WIDE RED SMALL 24 (CLIP) ×3 IMPLANT
CRADLE DONUT ADULT HEAD (MISCELLANEOUS) ×3 IMPLANT
DRAPE CARDIOVASCULAR INCISE (DRAPES) ×1
DRAPE SLUSH/WARMER DISC (DRAPES) ×3 IMPLANT
DRAPE SRG 135X102X78XABS (DRAPES) ×2 IMPLANT
DRSG COVADERM 4X14 (GAUZE/BANDAGES/DRESSINGS) ×3 IMPLANT
ELECT CAUTERY BLADE 6.4 (BLADE) ×3 IMPLANT
ELECT REM PT RETURN 9FT ADLT (ELECTROSURGICAL) ×6
ELECTRODE REM PT RTRN 9FT ADLT (ELECTROSURGICAL) ×4 IMPLANT
FELT TEFLON 1X6 (MISCELLANEOUS) ×3 IMPLANT
GAUZE SPONGE 4X4 12PLY STRL (GAUZE/BANDAGES/DRESSINGS) ×6 IMPLANT
GAUZE SPONGE 4X4 12PLY STRL LF (GAUZE/BANDAGES/DRESSINGS) ×3 IMPLANT
GLOVE BIO SURGEON STRL SZ 6 (GLOVE) ×6 IMPLANT
GLOVE BIO SURGEON STRL SZ 6.5 (GLOVE) IMPLANT
GLOVE BIO SURGEON STRL SZ7 (GLOVE) IMPLANT
GLOVE BIO SURGEON STRL SZ7.5 (GLOVE) IMPLANT
GLOVE BIOGEL M 6.5 STRL (GLOVE) ×6 IMPLANT
GLOVE BIOGEL PI IND STRL 6 (GLOVE) IMPLANT
GLOVE BIOGEL PI IND STRL 6.5 (GLOVE) ×10 IMPLANT
GLOVE BIOGEL PI IND STRL 7.0 (GLOVE) IMPLANT
GLOVE BIOGEL PI INDICATOR 6 (GLOVE)
GLOVE BIOGEL PI INDICATOR 6.5 (GLOVE) ×5
GLOVE BIOGEL PI INDICATOR 7.0 (GLOVE)
GLOVE EUDERMIC 7 POWDERFREE (GLOVE) ×6 IMPLANT
GLOVE ORTHO TXT STRL SZ7.5 (GLOVE) ×9 IMPLANT
GOWN STRL REUS W/ TWL LRG LVL3 (GOWN DISPOSABLE) ×16 IMPLANT
GOWN STRL REUS W/ TWL XL LVL3 (GOWN DISPOSABLE) ×2 IMPLANT
GOWN STRL REUS W/TWL LRG LVL3 (GOWN DISPOSABLE) ×8
GOWN STRL REUS W/TWL XL LVL3 (GOWN DISPOSABLE) ×1
HEMOSTAT POWDER SURGIFOAM 1G (HEMOSTASIS) ×9 IMPLANT
HEMOSTAT SURGICEL 2X14 (HEMOSTASIS) ×3 IMPLANT
INSERT FOGARTY 61MM (MISCELLANEOUS) IMPLANT
INSERT FOGARTY XLG (MISCELLANEOUS) IMPLANT
KIT BASIN OR (CUSTOM PROCEDURE TRAY) ×3 IMPLANT
KIT CATH CPB BARTLE (MISCELLANEOUS) ×3 IMPLANT
KIT ROOM TURNOVER OR (KITS) ×3 IMPLANT
KIT SUCTION CATH 14FR (SUCTIONS) ×3 IMPLANT
KIT VASOVIEW HEMOPRO VH 3000 (KITS) ×3 IMPLANT
NS IRRIG 1000ML POUR BTL (IV SOLUTION) ×18 IMPLANT
PACK OPEN HEART (CUSTOM PROCEDURE TRAY) ×3 IMPLANT
PAD ARMBOARD 7.5X6 YLW CONV (MISCELLANEOUS) ×6 IMPLANT
PAD ELECT DEFIB RADIOL ZOLL (MISCELLANEOUS) ×3 IMPLANT
PENCIL BUTTON HOLSTER BLD 10FT (ELECTRODE) ×3 IMPLANT
PUNCH AORTIC ROTATE 4.0MM (MISCELLANEOUS) IMPLANT
PUNCH AORTIC ROTATE 4.5MM 8IN (MISCELLANEOUS) ×3 IMPLANT
PUNCH AORTIC ROTATE 5MM 8IN (MISCELLANEOUS) IMPLANT
SET CARDIOPLEGIA MPS 5001102 (MISCELLANEOUS) ×3 IMPLANT
SOLUTION ANTI FOG 6CC (MISCELLANEOUS) ×3 IMPLANT
SPONGE INTESTINAL PEANUT (DISPOSABLE) IMPLANT
SPONGE LAP 18X18 X RAY DECT (DISPOSABLE) ×3 IMPLANT
SPONGE LAP 4X18 X RAY DECT (DISPOSABLE) ×3 IMPLANT
SUT BONE WAX W31G (SUTURE) ×3 IMPLANT
SUT MNCRL AB 4-0 PS2 18 (SUTURE) ×3 IMPLANT
SUT PROLENE 3 0 SH DA (SUTURE) IMPLANT
SUT PROLENE 3 0 SH1 36 (SUTURE) ×3 IMPLANT
SUT PROLENE 4 0 RB 1 (SUTURE)
SUT PROLENE 4 0 SH DA (SUTURE) IMPLANT
SUT PROLENE 4-0 RB1 .5 CRCL 36 (SUTURE) IMPLANT
SUT PROLENE 5 0 C 1 36 (SUTURE) IMPLANT
SUT PROLENE 6 0 C 1 30 (SUTURE) IMPLANT
SUT PROLENE 7 0 BV 1 (SUTURE) IMPLANT
SUT PROLENE 7 0 BV1 MDA (SUTURE) ×3 IMPLANT
SUT PROLENE 8 0 BV175 6 (SUTURE) IMPLANT
SUT SILK  1 MH (SUTURE)
SUT SILK 1 MH (SUTURE) IMPLANT
SUT STEEL STERNAL CCS#1 18IN (SUTURE) IMPLANT
SUT STEEL SZ 6 DBL 3X14 BALL (SUTURE) ×9 IMPLANT
SUT VIC AB 1 CTX 36 (SUTURE) ×2
SUT VIC AB 1 CTX36XBRD ANBCTR (SUTURE) ×4 IMPLANT
SUT VIC AB 2-0 CT1 27 (SUTURE) ×1
SUT VIC AB 2-0 CT1 TAPERPNT 27 (SUTURE) ×2 IMPLANT
SUT VIC AB 2-0 CTX 27 (SUTURE) IMPLANT
SUT VIC AB 3-0 SH 27 (SUTURE)
SUT VIC AB 3-0 SH 27X BRD (SUTURE) IMPLANT
SUT VIC AB 3-0 X1 27 (SUTURE) IMPLANT
SUT VICRYL 2 TP 1 (SUTURE) ×3 IMPLANT
SUT VICRYL 4-0 PS2 18IN ABS (SUTURE) IMPLANT
SUTURE E-PAK OPEN HEART (SUTURE) ×3 IMPLANT
SYSTEM SAHARA CHEST DRAIN ATS (WOUND CARE) ×3 IMPLANT
TAPE CLOTH SURG 4X10 WHT LF (GAUZE/BANDAGES/DRESSINGS) ×3 IMPLANT
TAPE PAPER 2X10 WHT MICROPORE (GAUZE/BANDAGES/DRESSINGS) ×3 IMPLANT
TOWEL GREEN STERILE (TOWEL DISPOSABLE) ×12 IMPLANT
TOWEL GREEN STERILE FF (TOWEL DISPOSABLE) ×6 IMPLANT
TOWEL OR 17X24 6PK STRL BLUE (TOWEL DISPOSABLE) IMPLANT
TOWEL OR 17X26 10 PK STRL BLUE (TOWEL DISPOSABLE) ×3 IMPLANT
TRAY FOLEY SILVER 16FR TEMP (SET/KITS/TRAYS/PACK) ×3 IMPLANT
TUBING INSUFFLATION (TUBING) ×3 IMPLANT
UNDERPAD 30X30 (UNDERPADS AND DIAPERS) ×3 IMPLANT
WATER STERILE IRR 1000ML POUR (IV SOLUTION) ×6 IMPLANT

## 2016-08-14 NOTE — Anesthesia Procedure Notes (Signed)
Procedure Name: Intubation Date/Time: 08/14/2016 12:26 PM Performed by: Lance Coon Pre-anesthesia Checklist: Patient identified, Emergency Drugs available, Suction available, Patient being monitored and Timeout performed Patient Re-evaluated:Patient Re-evaluated prior to inductionOxygen Delivery Method: Circle system utilized Preoxygenation: Pre-oxygenation with 100% oxygen Intubation Type: IV induction Ventilation: Mask ventilation without difficulty Laryngoscope Size: Mac, 3, Glidescope and 4 Grade View: Grade I Tube type: Oral Tube size: 8.0 mm Number of attempts: 2 Airway Equipment and Method: Stylet and Video-laryngoscopy Placement Confirmation: ETT inserted through vocal cords under direct vision,  positive ETCO2 and breath sounds checked- equal and bilateral Secured at: 23 cm Tube secured with: Tape Dental Injury: Teeth and Oropharynx as per pre-operative assessment  Comments: Airway per C. [employer] forces training and education officer. DLx1 mac 3 blade grade 3 view. glidescope x1

## 2016-08-14 NOTE — Procedures (Signed)
Extubation Procedure Note  Patient Details:   Name: Marc Gilbert DOB: October 10, 1930 MRN: [id number]   Airway Documentation:  Airway 8 mm (Active)  Secured at (cm) 23 cm 08/14/2016  7:38 PM  Measured From Lips 08/14/2016  7:38 PM  Secured Location Right 08/14/2016  7:38 PM  Secured By Pink Tape 08/14/2016  7:38 PM  Site Condition Dry 08/14/2016  7:38 PM    Evaluation  O2 sats: stable throughout Complications: No apparent complications Patient did tolerate procedure well. Bilateral Breath Sounds: Clear   Yes   Patient extubated to [facility] without any complications. Patient has an adequate cough and is able to speak. All vitals stable at this time.  Blanchie Serve 08/14/2016, 10:11 PM

## 2016-08-14 NOTE — Anesthesia Procedure Notes (Signed)
Procedures

## 2016-08-14 NOTE — Anesthesia Preprocedure Evaluation (Addendum)
Anesthesia Evaluation  Patient identified by MRN, date of birth, ID band Patient awake    Reviewed: Allergy & Precautions, NPO status , Patient's Chart, lab work & pertinent test results  Airway Mallampati: II  TM Distance: >3 FB     Dental   Pulmonary COPD, former smoker,    breath sounds clear to auscultation       Cardiovascular hypertension, + angina  Rhythm:Regular Rate:Normal     Neuro/Psych    GI/Hepatic Neg liver ROS, hiatal hernia, GERD  ,  Endo/Other    Renal/GU Renal disease     Musculoskeletal  (+) Arthritis ,   Abdominal   Peds  Hematology   Anesthesia Other Findings   Reproductive/Obstetrics                            Anesthesia Physical Anesthesia Plan  ASA: III  Anesthesia Plan: General   Post-op Pain Management:    Induction: Intravenous  PONV Risk Score and Plan: 2 and Ondansetron, Propofol, Treatment may vary due to age or medical condition and Dexamethasone  Airway Management Planned: Oral ETT  Additional Equipment:   Intra-op Plan:   Post-operative Plan: Post-operative intubation/ventilation  Informed Consent: I have reviewed the patients History and Physical, chart, labs and discussed the procedure including the risks, benefits and alternatives for the proposed anesthesia with the patient or authorized representative who has indicated his/her understanding and acceptance.   Dental advisory given  Plan Discussed with: CRNA, Anesthesiologist and Surgeon  Anesthesia Plan Comments:        Anesthesia Quick Evaluation

## 2016-08-14 NOTE — Brief Op Note (Signed)
08/10/2016 - 08/14/2016  2:50 PM  PATIENT:  Marc Gilbert  83 y.o. male  PRE-OPERATIVE DIAGNOSIS:  CAD LMD  POST-OPERATIVE DIAGNOSIS:  CAD LMD  PROCEDURE:  Procedure(s): CORONARY ARTERY BYPASS GRAFTING (CABG) (N/A) TRANSESOPHAGEAL ECHOCARDIOGRAM (TEE) (N/A)  LIMA to LAD SVG to OM1  SURGEON:  Surgeon(s) and Role:    * Bartle, Fernande Boyden, MD - Primary  PHYSICIAN ASSISTANT:  Nicholes Rough, PA-C   ANESTHESIA:   general  EBL:  Total I/O In: 1000 [I.V.:1000] Out: 300 [Urine:300]  BLOOD ADMINISTERED:none  DRAINS: ROUTINE   LOCAL MEDICATIONS USED:  NONE  SPECIMEN:  No Specimen  DISPOSITION OF SPECIMEN:  N/A  COUNTS:  YES  TOURNIQUET:  * No tourniquets in log *  DICTATION: .Dragon Dictation  PLAN OF CARE: Admit to inpatient   PATIENT DISPOSITION:  ICU - intubated and hemodynamically stable.   Delay start of Pharmacological VTE agent (>24hrs) due to surgical blood loss or risk of bleeding: yes

## 2016-08-14 NOTE — Progress Notes (Signed)
[place] for heparin Indication: chest pain/ACS  Allergies  Allergen Reactions  . No Known Allergies     Patient Measurements: Height: 5\' 11"  (180.3 cm) Weight: 199 lb 12.8 oz (90.6 kg) IBW/kg (Calculated) : 75.3 Heparin Dosing Weight: 89.4 kg  Vital Signs: Temp: 98.2 F (36.8 C) (06/29 0512) Temp Source: Oral (06/29 0512) BP: 113/78 (06/29 0512) Pulse Rate: 77 (06/29 0512)  Labs:  Recent Labs  08/11/16 1033 08/11/16 1543  08/12/16 0520 08/12/16 1011 08/13/16 0437 08/14/16 0456 08/14/16 0502  HGB  --   --   < > 13.3  --  12.6* 13.0  --   HCT  --   --   --  40.5  --  38.7* 39.6  --   PLT  --   --   --  171  --  191 215  --   HEPARINUNFRC  --   --   --   --  0.30 0.74*  --  0.86*  CREATININE  --   --   --   --   --   --  1.10  --   TROPONINI 0.06* 0.04*  --   --   --   --   --   --   < > = values in this interval not displayed.  Estimated Creatinine Clearance: 57.6 mL/min (by C-G formula based on SCr of 1.1 mg/dL).    Assessment: 83 yo male admitted with chest pain now s/p cath awaiting CABG 6/29.   Heparin level this am is higher than desired at 0.86. Planning for CABG this afternoon.   Goal of Therapy:  Heparin level 0.3-0.7 units/ml Monitor platelets by anticoagulation protocol: Yes   Plan:  1. Decrease Heparin infusion to 1100 units /hr 2. F/u CABG plans  Vincenza Hews, PharmD, BCPS 08/14/2016, 7:10 AM

## 2016-08-14 NOTE — Care Management Important Message (Signed)
Important Message  Patient Details  Name: Marc Gilbert MRN: [id number] Date of Birth: [birth year]   Medicare Important Message Given:  Yes    Nathen May 08/14/2016, 10:46 AM

## 2016-08-14 NOTE — Transfer of Care (Signed)
Immediate Anesthesia Transfer of Care Note  Patient: Marc Gilbert  Procedure(s) Performed: Procedure(s): CORONARY ARTERY BYPASS GRAFTING (CABG)x2, using left internal mammary artery and right greater saphenous vein harvested endoscopically (N/A) TRANSESOPHAGEAL ECHOCARDIOGRAM (TEE) (N/A)  Patient Location: SICU  Anesthesia Type:General  Level of Consciousness: sedated, patient cooperative and Patient remains intubated per anesthesia plan  Airway & Oxygen Therapy: Patient remains intubated per anesthesia plan and Patient placed on Ventilator (see vital sign flow sheet for setting)  Post-op Assessment: Report given to RN and Post -op Vital signs reviewed and stable  Post vital signs: Reviewed and stable  Last Vitals:  Vitals:   08/14/16 1010 08/14/16 1625  BP: 137/81 (!) 106/58  Pulse: 82 97  Resp:  12  Temp:      Last Pain:  Vitals:   08/14/16 0800  TempSrc:   PainSc: 0-No pain      Patients Stated Pain Goal: 0 (30/13/14 3888)  Complications: No apparent anesthesia complications

## 2016-08-14 NOTE — Anesthesia Procedure Notes (Signed)
Arterial Line Insertion Start/End6/29/2018 11:40 AM, 08/14/2016 11:45 AM Performed by: Judeth Cornfield T, CRNA  Preanesthetic checklist: patient identified, IV checked, site marked, risks and benefits discussed, surgical consent, monitors and equipment checked, pre-op evaluation and timeout performed Lidocaine 1% used for infiltration and patient sedated Left, radial was placed Catheter size: 20 G Hand hygiene performed , maximum sterile barriers used  and Seldinger technique used Allen's test indicative of satisfactory collateral circulation Attempts: 1 Procedure performed without using ultrasound guided technique. Following insertion, dressing applied and Biopatch. Post procedure assessment: normal  Patient tolerated the procedure well with no immediate complications.

## 2016-08-14 NOTE — Care Management Note (Signed)
Case Management Note  Patient Details  Name: Marc Gilbert MRN: [id number] Date of Birth: Feb 12, 1931  Subjective/Objective:   Pt presented for Chest Pain-Nstemi- post cath revealed Multivessel CAD. Plan for CABG 08-14-16. Pt is from home with wife-[facility] Facility.                 Action/Plan: CM will continue to monitor for disposition needs.   Expected Discharge Date:                  Expected Discharge Plan:  Home  In-House Referral:     Discharge planning Services  CM Consult  Post Acute Care Choice:    Choice offered to:     DME Arranged:    DME Agency:     HH Arranged:    [place] Agency:     Status of Service:  In process, will continue to follow  If discussed at Long Length of Stay Meetings, dates discussed:    Additional Comments:  Bethena Roys, RN 08/14/2016, 11:44 AM

## 2016-08-14 NOTE — Progress Notes (Signed)
Report given to  Short stay, pt transported via stretcher to OR accompanied by RN Kerrie Buffalo,  Edward Qualia RN

## 2016-08-14 NOTE — Progress Notes (Signed)
Progress Note  Patient Name: Marc Gilbert Date of Encounter: 08/14/2016  Primary Cardiologist: Dr. Percival Spanish   Subjective   CP free. Scheduled for CABG today.   Inpatient Medications    Scheduled Meds: . aspirin  81 mg Oral Daily  . atorvastatin  80 mg Oral q1800  . chlorhexidine  15 mL Mouth/Throat Once  . Chlorhexidine Gluconate Cloth  6 each Topical Once  . colchicine  0.6 mg Oral BID  . diazepam  5 mg Oral Once  . heparin-papaverine-plasmalyte irrigation   Irrigation To OR  . magnesium sulfate  40 mEq Other To OR  . metoprolol tartrate  12.5 mg Oral BID  . metoprolol tartrate  12.5 mg Oral Once  .  morphine injection  4 mg Intravenous Once  . potassium chloride  80 mEq Other To OR  . predniSONE  5 mg Oral 4X daily taper  . psyllium  1 packet Oral BID  . sodium chloride flush  3 mL Intravenous Q12H  . tamsulosin  0.8 mg Oral Daily  . tranexamic acid  15 mg/kg Intravenous To OR  . tranexamic acid  2 mg/kg Intracatheter To OR   Continuous Infusions: . sodium chloride    . cefUROXime (ZINACEF)  IV    . cefUROXime (ZINACEF)  IV    . dexmedetomidine    . DOPamine    . epinephrine    . heparin 30,000 units/NS 1000 mL solution for CELLSAVER    . heparin 1,100 Units/hr (08/14/16 0730)  . insulin (NOVOLIN-R) infusion    . nitroGLYCERIN 10 mcg/min (08/12/16 0454)  . nitroGLYCERIN    . phenylephrine 20mg /268mL NS (0.08mg /ml) infusion    . tranexamic acid (CYKLOKAPRON) infusion ([facility])    . vancomycin     PRN Meds: sodium chloride, acetaminophen, acetaminophen, ALPRAZolam, magnesium hydroxide, ondansetron (ZOFRAN) IV, ondansetron (ZOFRAN) IV, oxyCODONE-acetaminophen, sodium chloride flush, traMADol   Vital Signs    Vitals:   08/13/16 1700 08/13/16 2113 08/14/16 0100 08/14/16 0512  BP: 135/61 129/68 120/72 113/78  Pulse: 65 83 73 77  Resp: 20 19 18 18   Temp: 98 F (36.7 C) 97.9 F (36.6 C) 98.1 F (36.7 C) 98.2 F (36.8 C)  TempSrc: Oral Oral Oral Oral  SpO2:  95% 94% 97% 94%  Weight:    199 lb 12.8 oz (90.6 kg)  Height:        Intake/Output Summary (Last 24 hours) at 08/14/16 0940 Last data filed at 08/14/16 0700  Gross per 24 hour  Intake          1096.57 ml  Output             2425 ml  Net         -1328.43 ml   Filed Weights   08/12/16 0400 08/13/16 0613 08/14/16 0512  Weight: 202 lb 3.2 oz (91.7 kg) 202 lb 11.2 oz (91.9 kg) 199 lb 12.8 oz (90.6 kg)    Telemetry    Currently on standby - Personally Reviewed  ECG    Normal sinus rhythm Non-specific intra-ventricular conduction block - Personally Reviewed  Physical Exam   GEN: No acute distress.   Neck: No JVD Cardiac: RRR, no murmurs, rubs, or gallops.  Respiratory: Clear to auscultation bilaterally. GI: Soft, nontender, non-distended  MS: No edema; No deformity. Neuro:  Nonfocal  Psych: Normal affect   Labs    Chemistry Recent Labs Lab 08/10/16 0841 08/11/16 0516 08/14/16 0456  NA 136 138 137  K 4.4 4.2 4.6  CL 105 108 105  CO2 23 23 23   GLUCOSE 121* 111* 130*  BUN 22* 21* 23*  CREATININE 1.32* 1.20 1.10  CALCIUM 8.7* 8.5* 8.9  GFRNONAA 48* 54* 60*  GFRAA 55* >60 >60  ANIONGAP 8 7 9      Hematology Recent Labs Lab 08/12/16 0520 08/13/16 0437 08/14/16 0456  WBC 7.1 6.1 6.5  RBC 4.33 4.13* 4.22  HGB 13.3 12.6* 13.0  HCT 40.5 38.7* 39.6  MCV 93.5 93.7 93.8  MCH 30.7 30.5 30.8  MCHC 32.8 32.6 32.8  RDW 12.7 12.7 12.9  PLT 171 191 215    Cardiac Enzymes Recent Labs Lab 08/11/16 0516 08/11/16 1033 08/11/16 1543  TROPONINI 0.07* 0.06* 0.04*    Recent Labs Lab 08/10/16 0839 08/10/16 1312  TROPIPOC 0.03 0.15*     BNPNo results for input(s): BNP, PROBNP in the last 168 hours.   DDimer No results for input(s): DDIMER in the last 168 hours.   Radiology    No results found.  Cardiac Studies    CATH  08/11/16  Conclusion    Critical obstruction in the proximal to mid LAD, 95%, within a heavily calcified  region.  Angiographically significant calcified distal left main, 60%.  Greater than 90% small first obtuse marginal  30% mid circumflex  Widely patent, dominant, RCA.  Normal left ventricular systolic function with moderate mid to distal anterior wall hypokinesis. EF 45-50%. Mildly elevated LVEDP consistent with acute diastolic heart failure.      Patient Profile     83 y.o. male with a PMH significant for HTN, HLD, GERD, COPD, CKD stage III, and chronic back pain. He reported to the [facility] with new onset chest pain.  Found to have LM and severe LAD disease.  Plan is for CABG.   Assessment & Plan    1. Multivessel CAD w/ LM Involement: scheduled for CABG today with Dr. Cyndia Bent at noon. We will follow post op.   Signed, Lyda Jester, PA-C  08/14/2016, 9:40 AM

## 2016-08-14 NOTE — Anesthesia Procedure Notes (Signed)
Anesthesia Procedure Note Procedures: Right IJ Gordy Councilman Catheter Insertion: [id number]: The patient was identified and consent obtained.  TO was performed, and full barrier precautions were used.  The skin was anesthetized with lidocaine-4cc plain with 25g needle.  Once the vein was located with the 22 ga. needle using ultrasound guidance , the wire was inserted into the vein.  The wire location was confirmed with ultrasound.  The tissue was dilated and the 8.5 [place] cordis catheter was carefully inserted. Afterwards Gordy Councilman catheter was inserted. PA catheter at 48cm.  The patient tolerated the procedure well.

## 2016-08-14 NOTE — Anesthesia Postprocedure Evaluation (Signed)
Anesthesia Post Note  Patient: Marc Gilbert  Procedure(s) Performed: Procedure(s) (LRB): CORONARY ARTERY BYPASS GRAFTING (CABG)x2, using left internal mammary artery and right greater saphenous vein harvested endoscopically (N/A) TRANSESOPHAGEAL ECHOCARDIOGRAM (TEE) (N/A)     Patient location during evaluation: SICU Anesthesia Type: General Level of consciousness: patient remains intubated per anesthesia plan Pain management: pain level controlled Respiratory status: patient remains intubated per anesthesia plan Cardiovascular status: stable Anesthetic complications: no    Last Vitals:  Vitals:   08/14/16 1631 08/14/16 1632  BP:    Pulse:    Resp: 12 16  Temp: 36.6 C 36.6 C    Last Pain:  Vitals:   08/14/16 0800  TempSrc:   PainSc: 0-No pain                 Emmerson Taddei

## 2016-08-14 NOTE — Progress Notes (Signed)
  Echocardiogram 2D Echocardiogram has been performed.  Maanasa Aderhold T Verenise Moulin 08/14/2016, 2:38 PM

## 2016-08-14 NOTE — Progress Notes (Signed)
NIF -25. VC .9L

## 2016-08-15 ENCOUNTER — Inpatient Hospital Stay (HOSPITAL_COMMUNITY): Payer: Medicare Other

## 2016-08-15 LAB — CBC
HCT: 34.5 % — ABNORMAL LOW (ref 39.0–52.0)
HCT: 36.8 % — ABNORMAL LOW (ref 39.0–52.0)
Hemoglobin: 11.1 g/dL — ABNORMAL LOW (ref 13.0–17.0)
Hemoglobin: 11.6 g/dL — ABNORMAL LOW (ref 13.0–17.0)
MCH: 30.5 pg (ref 26.0–34.0)
MCH: 30.2 pg (ref 26.0–34.0)
MCHC: 32.2 g/dL (ref 30.0–36.0)
MCHC: 31.5 g/dL (ref 30.0–36.0)
MCV: 94.8 fL (ref 78.0–100.0)
MCV: 95.8 fL (ref 78.0–100.0)
Platelets: 145 10*3/uL — ABNORMAL LOW (ref 150–400)
Platelets: 171 10*3/uL (ref 150–400)
RBC: 3.64 MIL/uL — ABNORMAL LOW (ref 4.22–5.81)
RBC: 3.84 MIL/uL — ABNORMAL LOW (ref 4.22–5.81)
RDW: 13.1 % (ref 11.5–15.5)
RDW: 13.3 % (ref 11.5–15.5)
WBC: 9.2 10*3/uL (ref 4.0–10.5)
WBC: 9.9 10*3/uL (ref 4.0–10.5)

## 2016-08-15 LAB — POCT I-STAT, CHEM 8
BUN: 15 mg/dL (ref 6–20)
Calcium, Ion: 1.15 mmol/L (ref 1.15–1.40)
Chloride: 97 mmol/L — ABNORMAL LOW (ref 101–111)
Creatinine, Ser: 1.2 mg/dL (ref 0.61–1.24)
Glucose, Bld: 144 mg/dL — ABNORMAL HIGH (ref 65–99)
HCT: 35 % — ABNORMAL LOW (ref 39.0–52.0)
Hemoglobin: 11.9 g/dL — ABNORMAL LOW (ref 13.0–17.0)
Potassium: 3.8 mmol/L (ref 3.5–5.1)
Sodium: 136 mmol/L (ref 135–145)
TCO2: 25 mmol/L (ref 0–100)

## 2016-08-15 LAB — BASIC METABOLIC PANEL
Anion gap: 7 (ref 5–15)
BUN: 15 mg/dL (ref 6–20)
CO2: 22 mmol/L (ref 22–32)
Calcium: 8 mg/dL — ABNORMAL LOW (ref 8.9–10.3)
Chloride: 108 mmol/L (ref 101–111)
Creatinine, Ser: 1.21 mg/dL (ref 0.61–1.24)
GFR calc Af Amer: >60 mL/min (ref >60)
GFR calc non Af Amer: 53 mL/min — ABNORMAL LOW (ref >60)
Glucose, Bld: 144 mg/dL — ABNORMAL HIGH (ref 65–99)
Potassium: 4.6 mmol/L (ref 3.5–5.1)
Sodium: 137 mmol/L (ref 135–145)

## 2016-08-15 LAB — MAGNESIUM
Magnesium: 2.4 mg/dL (ref 1.7–2.4)
Magnesium: 2.1 mg/dL (ref 1.7–2.4)

## 2016-08-15 LAB — GLUCOSE, CAPILLARY
Glucose-Capillary: 150 mg/dL — ABNORMAL HIGH (ref 65–99)
Glucose-Capillary: 133 mg/dL — ABNORMAL HIGH (ref 65–99)
Glucose-Capillary: 130 mg/dL — ABNORMAL HIGH (ref 65–99)
Glucose-Capillary: 127 mg/dL — ABNORMAL HIGH (ref 65–99)

## 2016-08-15 LAB — CREATININE, SERUM
Creatinine, Ser: 1.32 mg/dL — ABNORMAL HIGH (ref 0.61–1.24)
GFR calc Af Amer: 55 mL/min — ABNORMAL LOW (ref >60)
GFR calc non Af Amer: 48 mL/min — ABNORMAL LOW (ref >60)

## 2016-08-15 MED ORDER — METOPROLOL TARTRATE 25 MG/10 ML ORAL SUSPENSION: 25.0000 mg | Freq: Two times a day (BID) | ORAL | Status: DC

## 2016-08-15 MED ORDER — ENOXAPARIN SODIUM 40 MG/0.4ML ~~LOC~~ SOLN
40.0000 mg | Freq: Every day | SUBCUTANEOUS | Status: DC
  Administered 2016-08-15 – 2016-08-18 (×4): 40 mg via SUBCUTANEOUS
  Filled 2016-08-15 (×4): qty 0.4

## 2016-08-15 MED ORDER — PANTOPRAZOLE SODIUM 40 MG PO TBEC
40.0000 mg | DELAYED_RELEASE_TABLET | Freq: Every day | ORAL | Status: DC
  Administered 2016-08-15 – 2016-08-17 (×3): 40 mg via ORAL
  Filled 2016-08-15 (×3): qty 1

## 2016-08-15 MED ORDER — INSULIN ASPART 100 UNIT/ML ~~LOC~~ SOLN
0.0000 [IU] | SUBCUTANEOUS | Status: DC
  Administered 2016-08-15 – 2016-08-16 (×3): 2 [IU] via SUBCUTANEOUS

## 2016-08-15 MED ORDER — FUROSEMIDE 10 MG/ML IJ SOLN
40.0000 mg | Freq: Two times a day (BID) | INTRAMUSCULAR | Status: AC
  Administered 2016-08-15 (×2): 40 mg via INTRAVENOUS
  Filled 2016-08-15 (×2): qty 4

## 2016-08-15 MED ORDER — METOPROLOL TARTRATE 25 MG PO TABS
25.0000 mg | ORAL_TABLET | Freq: Two times a day (BID) | ORAL | Status: DC
  Administered 2016-08-15 – 2016-08-17 (×5): 25 mg via ORAL
  Filled 2016-08-15 (×5): qty 1

## 2016-08-15 NOTE — Progress Notes (Signed)
1 Day Post-Op Procedure(s) (LRB): CORONARY ARTERY BYPASS GRAFTING (CABG)x2, using left internal mammary artery and right greater saphenous vein harvested endoscopically (N/A) TRANSESOPHAGEAL ECHOCARDIOGRAM (TEE) (N/A) Subjective: No complaints  Objective: Vital signs in last 24 hours: Temp:  [95.9 F (35.5 C)-99.7 F (37.6 C)] 99.7 F (37.6 C) (06/30 0900) Pulse Rate:  [73-97] 91 (06/30 0900) Cardiac Rhythm: Normal sinus rhythm (06/30 0800) Resp:  [12-27] 27 (06/30 0900) BP: (79-137)/(56-89) 108/64 (06/30 0800) SpO2:  [92 %-100 %] 94 % (06/30 0900) Arterial Line BP: (81-160)/(48-84) 141/55 (06/30 0900) FiO2 (%):  [40 %-50 %] 40 % (06/29 2114) Weight:  [90.6 kg (199 lb 12.8 oz)-95.2 kg (209 lb 14.1 oz)] 95.2 kg (209 lb 14.1 oz) (06/30 0452)  Hemodynamic parameters for last 24 hours: PAP: (25-56)/(15-28) 40/15 CO:  [3 L/min-7.6 L/min] 5.8 L/min CI:  [1.5 L/min/m2-3.6 L/min/m2] 2.8 L/min/m2  Intake/Output from previous day: 06/29 0701 - 06/30 0700 In: 5209 [I.V.:3539; Blood:400; NG/GT:20; IV Piggyback:1250] Out: 3600 [Urine:2300; Blood:800; Chest Tube:500] Intake/Output this shift: Total I/O In: 260.5 [I.V.:260.5] Out: 215 [Urine:165; Chest Tube:50]  General appearance: alert and cooperative Neurologic: intact Heart: regular rate and rhythm, S1, S2 normal, no murmur, click, rub or gallop Lungs: clear to auscultation bilaterally Extremities: edema mild Wound: dressings dry  Lab Results:  Recent Labs  08/14/16 2145 08/14/16 2147 08/15/16 0420  WBC 10.3  --  9.2  HGB 11.3* 11.2* 11.1*  HCT 35.2* 33.0* 34.5*  PLT 137*  --  145*   BMET:  Recent Labs  08/14/16 0456  08/14/16 2147 08/15/16 0420  NA 137  < > 139 137  K 4.6  < > 4.6 4.6  CL 105  --  106 108  CO2 23  --   --  22  GLUCOSE 130*  < > 132* 144*  BUN 23*  --  19 15  CREATININE 1.10  < > 1.20 1.21  CALCIUM 8.9  --   --  8.0*  < > = values in this interval not displayed.  PT/INR:  Recent Labs  08/14/16 1624  LABPROT 17.2*  INR 1.39   ABG    Component Value Date/Time   PHART 7.338 (L) 08/14/2016 2332   HCO3 19.9 (L) 08/14/2016 2332   TCO2 21 08/14/2016 2332   ACIDBASEDEF 5.0 (H) 08/14/2016 2332   O2SAT 96.0 08/14/2016 2332   CBG (last 3)   Recent Labs  08/14/16 2331 08/15/16 0428 08/15/16 0815  GLUCAP 146* 150* 133*   CXR: bilateral LL atelectasis  ECG: sinus, no acute changes  Assessment/Plan: S/P Procedure(s) (LRB): CORONARY ARTERY BYPASS GRAFTING (CABG)x2, using left internal mammary artery and right greater saphenous vein harvested endoscopically (N/A) TRANSESOPHAGEAL ECHOCARDIOGRAM (TEE) (N/A)  He is hemodynamically stable in sinus rhythm. Continue Lopressor Mobilize Diuresis d/c tubes/lines Continue foley due to diuresing patient and patient in ICU See progression orders   LOS: 5 days    Gaye Pollack 08/15/2016

## 2016-08-15 NOTE — Plan of Care (Signed)
Problem: Activity: Goal: Risk for activity intolerance will decrease Outcome: Progressing Patient ambulated around the unit (370 feet) using walker. Tolerated ambulation with no issues noted.  Problem: Cardiac: Goal: Hemodynamic stability will improve Outcome: Progressing Patient off nitro gtt. Pacing wires taped and not in use. BP maintaining within parameters.   Problem: Coping: Goal: Ability to adjust to condition or change in health will improve Outcome: Progressing Patient asking questions regarding progress and treatment. Participating in activity and tolerating well.  Problem: Respiratory: Goal: Respiratory status will improve Outcome: Progressing Patient on 2L [place] in order to keep oxygen levels within parameters. Patient working on IS independently.  Goal: Ability to tolerate decreased levels of ventilator support will improve Outcome: Completed/Met Date Met: 08/15/16 Patient extubated evening of 6/29.   Problem: Pain Management: Goal: Pain level will decrease Outcome: Progressing Patient using PRN medication and having acceptable levels of pain. Participating in activities with staff and not hindered due to pain.

## 2016-08-16 ENCOUNTER — Inpatient Hospital Stay (HOSPITAL_COMMUNITY): Payer: Medicare Other

## 2016-08-16 DIAGNOSIS — Z951 Presence of aortocoronary bypass graft: Secondary | ICD-10-CM

## 2016-08-16 LAB — CBC
HCT: 35.5 % — ABNORMAL LOW (ref 39.0–52.0)
Hemoglobin: 11.3 g/dL — ABNORMAL LOW (ref 13.0–17.0)
MCH: 29.9 pg (ref 26.0–34.0)
MCHC: 31.8 g/dL (ref 30.0–36.0)
MCV: 93.9 fL (ref 78.0–100.0)
Platelets: 137 10*3/uL — ABNORMAL LOW (ref 150–400)
RBC: 3.78 MIL/uL — ABNORMAL LOW (ref 4.22–5.81)
RDW: 13 % (ref 11.5–15.5)
WBC: 10 10*3/uL (ref 4.0–10.5)

## 2016-08-16 LAB — GLUCOSE, CAPILLARY
Glucose-Capillary: 114 mg/dL — ABNORMAL HIGH (ref 65–99)
Glucose-Capillary: 110 mg/dL — ABNORMAL HIGH (ref 65–99)
Glucose-Capillary: 124 mg/dL — ABNORMAL HIGH (ref 65–99)
Glucose-Capillary: 128 mg/dL — ABNORMAL HIGH (ref 65–99)
Glucose-Capillary: 147 mg/dL — ABNORMAL HIGH (ref 65–99)

## 2016-08-16 LAB — BASIC METABOLIC PANEL
Anion gap: 7 (ref 5–15)
BUN: 14 mg/dL (ref 6–20)
CO2: 26 mmol/L (ref 22–32)
Calcium: 8.2 mg/dL — ABNORMAL LOW (ref 8.9–10.3)
Chloride: 98 mmol/L — ABNORMAL LOW (ref 101–111)
Creatinine, Ser: 1.24 mg/dL (ref 0.61–1.24)
GFR calc Af Amer: 60 mL/min — ABNORMAL LOW (ref >60)
GFR calc non Af Amer: 52 mL/min — ABNORMAL LOW (ref >60)
Glucose, Bld: 117 mg/dL — ABNORMAL HIGH (ref 65–99)
Potassium: 3.4 mmol/L — ABNORMAL LOW (ref 3.5–5.1)
Sodium: 131 mmol/L — ABNORMAL LOW (ref 135–145)

## 2016-08-16 MED ORDER — PSYLLIUM 95 % PO PACK
1.0000 | PACK | Freq: Two times a day (BID) | ORAL | Status: DC
  Administered 2016-08-17 – 2016-08-18 (×4): 1 via ORAL
  Filled 2016-08-16 (×5): qty 1

## 2016-08-16 MED ORDER — FUROSEMIDE 10 MG/ML IJ SOLN
40.0000 mg | Freq: Once | INTRAMUSCULAR | Status: AC
  Administered 2016-08-16: 40 mg via INTRAVENOUS
  Filled 2016-08-16: qty 4

## 2016-08-16 MED ORDER — POTASSIUM CHLORIDE CRYS ER 20 MEQ PO TBCR
20.0000 meq | EXTENDED_RELEASE_TABLET | Freq: Three times a day (TID) | ORAL | Status: DC
  Administered 2016-08-16 – 2016-08-17 (×4): 20 meq via ORAL
  Filled 2016-08-16 (×4): qty 1

## 2016-08-16 MED ORDER — POTASSIUM CHLORIDE 10 MEQ/50ML IV SOLN
10.0000 meq | INTRAVENOUS | Status: AC
  Administered 2016-08-16 (×3): 10 meq via INTRAVENOUS
  Filled 2016-08-16 (×4): qty 50

## 2016-08-16 MED ORDER — PSYLLIUM 95 % PO PACK
1.0000 | PACK | Freq: Two times a day (BID) | ORAL | Status: DC
  Filled 2016-08-16: qty 1

## 2016-08-16 NOTE — Plan of Care (Signed)
Problem: Pain Managment: Goal: General experience of comfort will improve Outcome: Progressing Denies pain  Problem: Activity: Goal: Ability to return to baseline activity level will improve Outcome: Progressing Chair x5h, ambulate in hall 1x  Problem: Cardiovascular: Goal: Ability to achieve and maintain adequate cardiovascular perfusion will improve Outcome: Progressing BP stable on PO meds

## 2016-08-16 NOTE — Progress Notes (Signed)
2 Days Post-Op Procedure(s) (LRB): CORONARY ARTERY BYPASS GRAFTING (CABG)x2, using left internal mammary artery and right greater saphenous vein harvested endoscopically (N/A) TRANSESOPHAGEAL ECHOCARDIOGRAM (TEE) (N/A) Subjective: No complaints, passing flatus  Objective: Vital signs in last 24 hours: Temp:  [98.3 F (36.8 C)-99.1 F (37.3 C)] 98.5 F (36.9 C) (07/01 0727) Pulse Rate:  [70-91] 86 (07/01 0800) Cardiac Rhythm: Normal sinus rhythm (07/01 0800) Resp:  [10-29] 19 (07/01 0800) BP: (99-142)/(53-82) 130/74 (07/01 0800) SpO2:  [90 %-96 %] 94 % (07/01 0800) Arterial Line BP: (104-161)/(53-70) 147/53 (06/30 1400) Weight:  [92.4 kg (203 lb 9.6 oz)] 92.4 kg (203 lb 9.6 oz) (07/01 0600)  Hemodynamic parameters for last 24 hours:    Intake/Output from previous day: 06/30 0701 - 07/01 0700 In: 2519.5 [P.O.:1680; I.V.:739.5; IV Piggyback:100] Out: 4481 [Urine:4030; Chest Tube:50] Intake/Output this shift: Total I/O In: 100 [IV Piggyback:100] Out: -   General appearance: alert and cooperative Neurologic: intact Heart: regular rate and rhythm, S1, S2 normal, no murmur, click, rub or gallop Lungs: clear to auscultation bilaterally Extremities: extremities normal, atraumatic, no cyanosis or edema incisions ok  Lab Results:  Recent Labs  08/15/16 1611 08/15/16 1618 08/16/16 0440  WBC 9.9  --  10.0  HGB 11.6* 11.9* 11.3*  HCT 36.8* 35.0* 35.5*  PLT 171  --  137*   BMET:  Recent Labs  08/15/16 0420  08/15/16 1618 08/16/16 0440  NA 137  --  136 131*  K 4.6  --  3.8 3.4*  CL 108  --  97* 98*  CO2 22  --   --  26  GLUCOSE 144*  --  144* 117*  BUN 15  --  15 14  CREATININE 1.21  < > 1.20 1.24  CALCIUM 8.0*  --   --  8.2*  < > = values in this interval not displayed.  PT/INR:  Recent Labs  08/14/16 1624  LABPROT 17.2*  INR 1.39   ABG    Component Value Date/Time   PHART 7.338 (L) 08/14/2016 2332   HCO3 19.9 (L) 08/14/2016 2332   TCO2 25 08/15/2016  1618   ACIDBASEDEF 5.0 (H) 08/14/2016 2332   O2SAT 96.0 08/14/2016 2332   CBG (last 3)   Recent Labs  08/16/16 0055 08/16/16 0440 08/16/16 0721  GLUCAP 114* 110* 124*   CXR; bibasilar atelectasis  Assessment/Plan: S/P Procedure(s) (LRB): CORONARY ARTERY BYPASS GRAFTING (CABG)x2, using left internal mammary artery and right greater saphenous vein harvested endoscopically (N/A) TRANSESOPHAGEAL ECHOCARDIOGRAM (TEE) (N/A)  Hemodynamically stable in sinus rhythm.   Mild volume excess: continue diuresis. Some prostate enlargement and frequency so will keep foley in for now to diurese. On Flomax.  Bibasilar atelectasis on CXR: continue IS, ambulation  Glucose under good control. Can stop CBG's and SSI. Preop Hgb A1c 5.7.   LOS: 6 days    Gaye Pollack 08/16/2016

## 2016-08-16 NOTE — Plan of Care (Signed)
Problem: Pain Managment: Goal: General experience of comfort will improve Outcome: Progressing Denies pain  Problem: Bowel/Gastric: Goal: Will not experience complications related to bowel motility Outcome: Progressing Patient is passing gas and feels less distended

## 2016-08-17 ENCOUNTER — Encounter (HOSPITAL_COMMUNITY): Payer: Self-pay | Admitting: Surgery

## 2016-08-17 LAB — GLUCOSE, CAPILLARY: Glucose-Capillary: 108 mg/dL — ABNORMAL HIGH (ref 65–99)

## 2016-08-17 MED ORDER — METOPROLOL TARTRATE 25 MG PO TABS
25.0000 mg | ORAL_TABLET | Freq: Two times a day (BID) | ORAL | Status: DC
  Administered 2016-08-17 – 2016-08-18 (×3): 25 mg via ORAL
  Filled 2016-08-17 (×3): qty 1

## 2016-08-17 MED ORDER — BISACODYL 5 MG PO TBEC
10.0000 mg | DELAYED_RELEASE_TABLET | Freq: Every day | ORAL | Status: DC | PRN
  Administered 2016-08-17 – 2016-08-18 (×2): 10 mg via ORAL
  Filled 2016-08-17 (×2): qty 2

## 2016-08-17 MED ORDER — ASPIRIN EC 325 MG PO TBEC
325.0000 mg | DELAYED_RELEASE_TABLET | Freq: Every day | ORAL | Status: DC
  Administered 2016-08-18: 325 mg via ORAL
  Filled 2016-08-17 (×2): qty 1

## 2016-08-17 MED ORDER — POTASSIUM CHLORIDE CRYS ER 20 MEQ PO TBCR
20.0000 meq | EXTENDED_RELEASE_TABLET | Freq: Two times a day (BID) | ORAL | Status: AC
  Administered 2016-08-17: 20 meq via ORAL
  Filled 2016-08-17: qty 1

## 2016-08-17 MED ORDER — MOVING RIGHT ALONG BOOK
Freq: Once | Status: DC
  Filled 2016-08-17: qty 1

## 2016-08-17 MED ORDER — PANTOPRAZOLE SODIUM 40 MG PO TBEC
40.0000 mg | DELAYED_RELEASE_TABLET | Freq: Every day | ORAL | Status: DC
  Administered 2016-08-18 – 2016-08-19 (×2): 40 mg via ORAL
  Filled 2016-08-17 (×2): qty 1

## 2016-08-17 MED ORDER — ACETAMINOPHEN 325 MG PO TABS
650.0000 mg | ORAL_TABLET | Freq: Four times a day (QID) | ORAL | Status: DC | PRN
  Administered 2016-08-17: 650 mg via ORAL
  Filled 2016-08-17: qty 2

## 2016-08-17 MED ORDER — TRAMADOL HCL 50 MG PO TABS: 50.0000 mg | ORAL_TABLET | ORAL | Status: DC | PRN

## 2016-08-17 MED ORDER — SODIUM CHLORIDE 0.9% FLUSH: 3.0000 mL | INTRAVENOUS | Status: DC | PRN

## 2016-08-17 MED ORDER — DOCUSATE SODIUM 100 MG PO CAPS
200.0000 mg | ORAL_CAPSULE | Freq: Every day | ORAL | Status: DC
  Administered 2016-08-18: 200 mg via ORAL
  Filled 2016-08-17 (×2): qty 2

## 2016-08-17 MED ORDER — SODIUM CHLORIDE 0.9% FLUSH
3.0000 mL | Freq: Two times a day (BID) | INTRAVENOUS | Status: DC
  Administered 2016-08-18 (×2): 3 mL via INTRAVENOUS

## 2016-08-17 MED ORDER — BISACODYL 10 MG RE SUPP: 10.0000 mg | Freq: Every day | RECTAL | Status: DC | PRN

## 2016-08-17 MED ORDER — ONDANSETRON HCL 4 MG PO TABS: 4.0000 mg | ORAL_TABLET | Freq: Four times a day (QID) | ORAL | Status: DC | PRN

## 2016-08-17 MED ORDER — OXYCODONE HCL 5 MG PO TABS: 5.0000 mg | ORAL_TABLET | ORAL | Status: DC | PRN

## 2016-08-17 MED ORDER — ONDANSETRON HCL 4 MG/2ML IJ SOLN: 4.0000 mg | Freq: Four times a day (QID) | INTRAMUSCULAR | Status: DC | PRN

## 2016-08-17 MED ORDER — SODIUM CHLORIDE 0.9 % IV SOLN: 250.0000 mL | INTRAVENOUS | Status: DC | PRN

## 2016-08-17 NOTE — Progress Notes (Signed)
Report called to [facility]

## 2016-08-17 NOTE — Progress Notes (Signed)
3 Days Post-Op Procedure(s) (LRB): CORONARY ARTERY BYPASS GRAFTING (CABG)x2, using left internal mammary artery and right greater saphenous vein harvested endoscopically (N/A) TRANSESOPHAGEAL ECHOCARDIOGRAM (TEE) (N/A) Subjective:  No complaints  Objective: Vital signs in last 24 hours: Temp:  [98.2 F (36.8 C)-99.4 F (37.4 C)] 99.2 F (37.3 C) (07/02 0500) Pulse Rate:  [84-109] 105 (07/02 0800) Cardiac Rhythm: Sinus tachycardia (07/02 0800) Resp:  [14-28] 24 (07/02 0800) BP: (100-142)/(57-92) 110/73 (07/02 0800) SpO2:  [92 %-97 %] 92 % (07/02 0800) Weight:  [90.9 kg (200 lb 6.4 oz)] 90.9 kg (200 lb 6.4 oz) (07/02 0700)  Hemodynamic parameters for last 24 hours:    Intake/Output from previous day: 07/01 0701 - 07/02 0700 In: 1050 [P.O.:900; IV Piggyback:150] Out: 3850 [Urine:3850] Intake/Output this shift: No intake/output data recorded.  General appearance: alert and cooperative Neurologic: intact Heart: regular rate and rhythm, S1, S2 normal, no murmur, click, rub or gallop Lungs: clear to auscultation bilaterally Extremities: extremities normal, atraumatic, no cyanosis or edema Wound: dressing dry  Lab Results:  Recent Labs  08/15/16 1611 08/15/16 1618 08/16/16 0440  WBC 9.9  --  10.0  HGB 11.6* 11.9* 11.3*  HCT 36.8* 35.0* 35.5*  PLT 171  --  137*   BMET:  Recent Labs  08/15/16 0420  08/15/16 1618 08/16/16 0440  NA 137  --  136 131*  K 4.6  --  3.8 3.4*  CL 108  --  97* 98*  CO2 22  --   --  26  GLUCOSE 144*  --  144* 117*  BUN 15  --  15 14  CREATININE 1.21  < > 1.20 1.24  CALCIUM 8.0*  --   --  8.2*  < > = values in this interval not displayed.  PT/INR:  Recent Labs  08/14/16 1624  LABPROT 17.2*  INR 1.39   ABG    Component Value Date/Time   PHART 7.338 (L) 08/14/2016 2332   HCO3 19.9 (L) 08/14/2016 2332   TCO2 25 08/15/2016 1618   ACIDBASEDEF 5.0 (H) 08/14/2016 2332   O2SAT 96.0 08/14/2016 2332   CBG (last 3)   Recent Labs  08/16/16 0721 08/16/16 1230 08/16/16 1630  GLUCAP 124* 128* 147*    Assessment/Plan: S/P Procedure(s) (LRB): CORONARY ARTERY BYPASS GRAFTING (CABG)x2, using left internal mammary artery and right greater saphenous vein harvested endoscopically (N/A) TRANSESOPHAGEAL ECHOCARDIOGRAM (TEE) (N/A)  He is hemodynamically stable in sinus rhythm  Diuresed back to baseline weight.  DC foley  Transfer to 2W and continue mobilization   LOS: 7 days    Gaye Pollack 08/17/2016

## 2016-08-17 NOTE — Progress Notes (Signed)
CARDIAC REHAB PHASE I   PRE:  Rate/Rhythm: 108 ST    BP: sitting 104/64     SaO2: 93 RA  MODE:  Ambulation: 500 ft   POST:  Rate/Rhythm: 110 ST with PACs    BP: sitting 150/80     SaO2: 95 RA  Pt able to get out of bed with min assist. Talked about mechanics. Steady with RW. Pt asks many questions, likes to know all information. Steady in hall, no major c/o. Return to recliner. BP elevated. Made plans to do education tomorrow at 1000 tomorrow with family. Will refer to [facility]. [place], ACSM 08/17/2016 2:17 PM

## 2016-08-17 NOTE — Addendum Note (Signed)
Addendum  created 08/17/16 [id number] by Belinda Block, MD   Image imported

## 2016-08-18 LAB — POCT I-STAT, CHEM 8
BUN: 23 mg/dL — ABNORMAL HIGH (ref 6–20)
BUN: 21 mg/dL — ABNORMAL HIGH (ref 6–20)
BUN: 23 mg/dL — ABNORMAL HIGH (ref 6–20)
BUN: 21 mg/dL — ABNORMAL HIGH (ref 6–20)
Calcium, Ion: 1.19 mmol/L (ref 1.15–1.40)
Calcium, Ion: 1.03 mmol/L — ABNORMAL LOW (ref 1.15–1.40)
Calcium, Ion: 1.22 mmol/L (ref 1.15–1.40)
Calcium, Ion: 1.12 mmol/L — ABNORMAL LOW (ref 1.15–1.40)
Chloride: 105 mmol/L (ref 101–111)
Chloride: 103 mmol/L (ref 101–111)
Chloride: 105 mmol/L (ref 101–111)
Chloride: 103 mmol/L (ref 101–111)
Creatinine, Ser: 0.9 mg/dL (ref 0.61–1.24)
Creatinine, Ser: 0.7 mg/dL (ref 0.61–1.24)
Creatinine, Ser: 0.8 mg/dL (ref 0.61–1.24)
Creatinine, Ser: 0.9 mg/dL (ref 0.61–1.24)
Glucose, Bld: 112 mg/dL — ABNORMAL HIGH (ref 65–99)
Glucose, Bld: 107 mg/dL — ABNORMAL HIGH (ref 65–99)
Glucose, Bld: 115 mg/dL — ABNORMAL HIGH (ref 65–99)
Glucose, Bld: 132 mg/dL — ABNORMAL HIGH (ref 65–99)
HCT: 36 % — ABNORMAL LOW (ref 39.0–52.0)
HCT: 28 % — ABNORMAL LOW (ref 39.0–52.0)
HCT: 33 % — ABNORMAL LOW (ref 39.0–52.0)
HCT: 28 % — ABNORMAL LOW (ref 39.0–52.0)
Hemoglobin: 12.2 g/dL — ABNORMAL LOW (ref 13.0–17.0)
Hemoglobin: 11.2 g/dL — ABNORMAL LOW (ref 13.0–17.0)
Hemoglobin: 9.5 g/dL — ABNORMAL LOW (ref 13.0–17.0)
Hemoglobin: 9.5 g/dL — ABNORMAL LOW (ref 13.0–17.0)
Potassium: 4.3 mmol/L (ref 3.5–5.1)
Potassium: 4.5 mmol/L (ref 3.5–5.1)
Potassium: 4.9 mmol/L (ref 3.5–5.1)
Potassium: 5.2 mmol/L — ABNORMAL HIGH (ref 3.5–5.1)
Sodium: 139 mmol/L (ref 135–145)
Sodium: 138 mmol/L (ref 135–145)
Sodium: 139 mmol/L (ref 135–145)
Sodium: 137 mmol/L (ref 135–145)
TCO2: 27 mmol/L (ref 0–100)
TCO2: 27 mmol/L (ref 0–100)
TCO2: 28 mmol/L (ref 0–100)
TCO2: 28 mmol/L (ref 0–100)

## 2016-08-18 LAB — POCT I-STAT 3, ART BLOOD GAS (G3+)
Acid-Base Excess: 1 mmol/L (ref 0.0–2.0)
Acid-Base Excess: 3 mmol/L — ABNORMAL HIGH (ref 0.0–2.0)
Bicarbonate: 27.4 mmol/L (ref 20.0–28.0)
Bicarbonate: 26.2 mmol/L (ref 20.0–28.0)
O2 Saturation: 100 %
O2 Saturation: 100 %
TCO2: 29 mmol/L (ref 0–100)
TCO2: 27 mmol/L (ref 0–100)
pCO2 arterial: 49.8 mmHg — ABNORMAL HIGH (ref 32.0–48.0)
pCO2 arterial: 35.8 mmHg (ref 32.0–48.0)
pH, Arterial: 7.349 — ABNORMAL LOW (ref 7.350–7.450)
pH, Arterial: 7.472 — ABNORMAL HIGH (ref 7.350–7.450)
pO2, Arterial: 555 mmHg — ABNORMAL HIGH (ref 83.0–108.0)
pO2, Arterial: 328 mmHg — ABNORMAL HIGH (ref 83.0–108.0)

## 2016-08-18 MED ORDER — FLEET ENEMA 7-19 GM/118ML RE ENEM
1.0000 | ENEMA | Freq: Every day | RECTAL | Status: DC | PRN
  Administered 2016-08-18: 1 via RECTAL
  Filled 2016-08-18: qty 1

## 2016-08-18 MED ORDER — LACTULOSE 10 GM/15ML PO SOLN
20.0000 g | Freq: Every day | ORAL | Status: DC | PRN
  Administered 2016-08-18: 20 g via ORAL
  Filled 2016-08-18: qty 30

## 2016-08-18 MED FILL — Sodium Chloride IV Soln 0.9%: INTRAVENOUS | Qty: 2000 | Status: AC

## 2016-08-18 MED FILL — Sodium Bicarbonate IV Soln 8.4%: INTRAVENOUS | Qty: 50 | Status: AC

## 2016-08-18 MED FILL — Electrolyte-R (PH 7.4) Solution: INTRAVENOUS | Qty: 3000 | Status: AC

## 2016-08-18 MED FILL — Lidocaine HCl IV Inj 20 MG/ML: INTRAVENOUS | Qty: 5 | Status: AC

## 2016-08-18 MED FILL — Heparin Sodium (Porcine) Inj 1000 Unit/ML: INTRAMUSCULAR | Qty: 10 | Status: AC

## 2016-08-18 MED FILL — Mannitol IV Soln 20%: INTRAVENOUS | Qty: 500 | Status: AC

## 2016-08-18 NOTE — Progress Notes (Signed)
CARDIAC REHAB PHASE I   PRE:  Rate/Rhythm: 95 SR    BP: sitting 122/60    SaO2: 97 RA  MODE:  Ambulation: 550 ft   POST:  Rate/Rhythm: 114 ST    BP: sitting 136/76     SaO2: 93 RA  Pt ambulated 300 ft with RW, 250 ft without RW. Steady, did well, although he fatigues more without RW. He is checking with his facility to see if RW is available. VSS, HR up with distance. Education completed with pt and wife. Good reception, appropriate questions. Will send referral to [facility].  [phone]   [place], ACSM 08/18/2016 10:29 AM

## 2016-08-18 NOTE — Progress Notes (Signed)
Patient with no complaints during 7 a to 7 p shift other than constipation which has been addressed in other notes.  Patient walked entire length of unit at least three times this shift.  Says he is ready to go home.    Did have a large BM after receiving Fleets enema.

## 2016-08-18 NOTE — Progress Notes (Signed)
Patient requested fleets enema d/t dulcolax and lactulose not producing a BM today.  Fleets administered.

## 2016-08-18 NOTE — Care Management Important Message (Signed)
Important Message  Patient Details  Name: Marc Gilbert MRN: [id number] Date of Birth: 06-10-1930   Medicare Important Message Given:  Yes    Nathen May 08/18/2016, 10:19 AM

## 2016-08-18 NOTE — Progress Notes (Addendum)
      [address]       [place] [address]             [phone]      4 Days Post-Op Procedure(s) (LRB): CORONARY ARTERY BYPASS GRAFTING (CABG)x2, using left internal mammary artery and right greater saphenous vein harvested endoscopically (N/A) TRANSESOPHAGEAL ECHOCARDIOGRAM (TEE) (N/A) Subjective: Feels good this morning. No issues over night.   Objective: Vital signs in last 24 hours: Temp:  [98.2 F (36.8 C)-99.6 F (37.6 C)] 99.6 F (37.6 C) (07/03 0538) Pulse Rate:  [96-109] 96 (07/03 0538) Cardiac Rhythm: Sinus tachycardia;Bundle branch block (07/03 0732) Resp:  [18-23] 18 (07/03 0538) BP: (97-137)/(64-80) 137/72 (07/03 0538) SpO2:  [93 %-100 %] 94 % (07/03 0538) Weight:  [89.8 kg (197 lb 14.4 oz)-90.4 kg (199 lb 4.8 oz)] 89.8 kg (197 lb 14.4 oz) (07/03 0538)     Intake/Output from previous day: 07/02 0701 - 07/03 0700 In: 240 [P.O.:240] Out: 1250 [Urine:1250] Intake/Output this shift: No intake/output data recorded.  General appearance: alert, cooperative and no distress Heart: sinus tachycardia Lungs: clear to auscultation bilaterally Abdomen: soft, non-tender; bowel sounds normal; no masses,  no organomegaly Extremities: extremities normal, atraumatic, no cyanosis or edema Wound: clean and dry  Lab Results:  Recent Labs  08/15/16 1611 08/15/16 1618 08/16/16 0440  WBC 9.9  --  10.0  HGB 11.6* 11.9* 11.3*  HCT 36.8* 35.0* 35.5*  PLT 171  --  137*   BMET:  Recent Labs  08/15/16 1618 08/16/16 0440  NA 136 131*  K 3.8 3.4*  CL 97* 98*  CO2  --  26  GLUCOSE 144* 117*  BUN 15 14  CREATININE 1.20 1.24  CALCIUM  --  8.2*    PT/INR: No results for input(s): LABPROT, INR in the last 72 hours. ABG    Component Value Date/Time   PHART 7.338 (L) 08/14/2016 2332   HCO3 19.9 (L) 08/14/2016 2332   TCO2 25 08/15/2016 1618   ACIDBASEDEF 5.0 (H) 08/14/2016 2332   O2SAT 96.0 08/14/2016 2332   CBG (last 3)   Recent Labs  08/16/16 0721 08/16/16 1230 08/16/16 1630  GLUCAP 124* 128* 147*    Assessment/Plan: S/P Procedure(s) (LRB): CORONARY ARTERY BYPASS GRAFTING (CABG)x2, using left internal mammary artery and right greater saphenous vein harvested endoscopically (N/A) TRANSESOPHAGEAL ECHOCARDIOGRAM (TEE) (N/A)  CV-sinus tachycardia, stable BP. Continue Lopressor, pravastatin.  Pulm-tolerating room air with good oxygen saturation. Encourage incentive spirometry and pulm toilet. Renal-creatinine stable. Weight continues to trend down. On Flomax for BPH. H and H stable Lovenox for DVT proph Endo-blood glucose level well controlled   Plan: remove EPW today. Continue incentive spirometry hourly. Ambulate TID. Likely home tomorrow.          LOS: 8 days    Marc Gilbert 08/18/2016   Chart reviewed, patient examined, agree with above. He is feeling well. Just had good BM. Pacing wires not removed today so will remove them in am. Plan home tomorrow.

## 2016-08-18 NOTE — Discharge Summary (Signed)
Physician Discharge Summary  Patient ID: Marc Gilbert MRN: [id number] DOB/AGE: 1930/08/28 83 y.o.  Admit date: 08/10/2016 Discharge date: 08/19/2016  Admission Diagnoses: 1. Unstable angina ([place]) 2. NSTEMI 3. Coronary artery disease  Active Diagnoses:  1. Hypertension 2. COPD (chronic obstructive pulmonary disease) ([place]) 3. CKD (chronic kidney disease) stage 3, GFR 30-59 ml/min 4. Hyperlipidemia 5. Unstable angina ([place]) 6. Osteoarthritis of hip 7. Cancer of skin, face 8. Chronic lower back pain 9. GERD (gastroesophageal reflux disease) 10. Prostate cancer ([place]) 11. H/O hiatal hernia 12. Tobacco abuse 13. ABl anemia 14. Mild throbocytopenia   Discharged Condition: good  HPI:  The patient is an active 83 year old gentleman with a history of hypertension, hyperlipidemia, remote smoke and mild COPD, and stage III CKD who presented with new onset substernal chest pain radiating down left arm. He had some prior chest pain in July and October 2017 and was seen in the ER and had a low risk myoview study. He had remained active at [facility] doing aerobic exercise and weight lifting. He was suppose to leave on a vacation to the [place] for a river boat cruise with his wife but had 3 episodes of this chest pain associated with shortness of breath and diaphoresis. These episodes occurred with minimal activity. His initial troponin was negative and peaked at 0.06. ECG showed a possible old anterior MI with no acute changes. Cath yesterday showed a 60% calcified distal LM with 95% proximal to mid LAD stenosis within a heavily calcified region. The LCX had a small first OM with 90% stenosis and a larger second OM. The RCA was dominant, large and widely patent. LVEF was 45-50% with mid to distal anterior hypokinesis and mildly elevated LVEDP of 18. An echo showed an LVEF of 55-60% with normal LV systolic function. He has remained free of further symptoms on heparin and NTG drips.   Hospital Course:   On 6/29/2-18 Marc Gilbert underwent a CABG x 2 with Dr. Cyndia Bent. He tolerated the procedure well and was transferred to the ICU. He was extubated in a timely manner. POD 1 he remained hemodynamically stable in sinus rhythm. We initiated a diuretic regimen for fluid overload. We discontinued chest tubes and lines. We began to mobilize the patient. We started Flomax for BPH. His blood glucose level was well controlled and we were able to stop insulin coverage. We removed his pacing wires POD 4. We continued to encourage ambulation and promote mobilization. His weight continued to trend down. He did have some expected blood loss anemia which did not require a transfusion. His last H and H was stable at 11.3 and 35.5. He also had mild thrombocytopenia. His last platelet count was 137,000. His epicardial pacing wires were removed on 08/19/2016. Chest tube sutures will remain and be removed in the office after discharge. He is tolerating room air, his incisions are healing well, and he is ambulating with limited assistance. He had a bowel movement last evening. His hear rate is in the low 100's of late so I increased his Lopressor. He is felt surgically stable for discharge today.   Consults: cardiology  Significant Diagnostic Studies:  CLINICAL DATA:  Follow-up CABG.  Chest tube removal.  EXAM: PORTABLE CHEST 1 VIEW  COMPARISON:  08/15/2016  FINDINGS: Left chest tube is been removed. Tiny amount of pleural air at the apex, 1%. Right internal jugular venous access sheath remains. Swan-Ganz catheter removed. Small pleural effusions left more than right with basilar volume loss.  IMPRESSION:  Left chest tube removed. Very tiny amount of pleural air at the apex. Pleural effusions and lower lobe atelectasis left more than right.   Electronically Signed   By: Marc Gilbert M.D.   On: 08/16/2016 09:53  Treatments:   08/10/2016 - 08/14/2016  2:50 PM  PATIENT:  [name]  83 y.o.  male  PRE-OPERATIVE DIAGNOSIS:  CAD LMD  POST-OPERATIVE DIAGNOSIS:  CAD LMD  PROCEDURE:  Procedure(s): CORONARY ARTERY BYPASS GRAFTING (CABG) (N/A) TRANSESOPHAGEAL ECHOCARDIOGRAM (TEE) (N/A)  LIMA to LAD SVG to OM1  SURGEON:  Surgeon(s) and Role:    * Bartle, Fernande Boyden, MD - Primary  PHYSICIAN ASSISTANT:  Nicholes Rough, PA-C   ANESTHESIA:   general  EBL:  Total I/O In: 1000 [I.V.:1000] Out: 300 [Urine:300]  BLOOD ADMINISTERED:none  DRAINS: ROUTINE   LOCAL MEDICATIONS USED:  NONE  SPECIMEN:  No Specimen  DISPOSITION OF SPECIMEN:  N/A  COUNTS:  YES   DICTATION: .Dragon Dictation  PLAN OF CARE: Admit to inpatient   PATIENT DISPOSITION:  ICU - intubated and hemodynamically stable.   Delay start of Pharmacological VTE agent (>24hrs) due to surgical blood loss or risk of bleeding: yes  Discharge Exam: Blood pressure 128/70, pulse 94, temperature 99.1 F (37.3 C), temperature source Oral, resp. rate 18, height 5' 11.5" (1.816 m), weight 89.6 kg (197 lb 9.6 oz), SpO2 95 %.   Cardiovascular: Slightly tachycardic Pulmonary: Clear to auscultation bilaterally Abdomen: Soft, non tender, bowel sounds present. Extremities: No lower extremity edema. Wounds: Clean and dry.  No erythema or signs of infection.  Disposition: 01-Home or Self Care  Discharge Instructions    Amb Referral to Cardiac Rehabilitation    Complete by:  As directed    Diagnosis:  CABG   CABG X ___:  2     Allergies as of 08/19/2016      Reactions   No Known Allergies       Medication List    STOP taking these medications   aspirin 81 MG chewable tablet Replaced by:  aspirin 325 MG EC tablet   losartan 25 MG tablet Commonly known as:  COZAAR     TAKE these medications   acetaminophen 500 MG tablet Commonly known as:  TYLENOL Take 500-1,000 mg by mouth every 6 (six) hours as needed for mild pain or moderate pain.   aspirin 325 MG EC tablet Take 1 tablet (325 mg  total) by mouth daily. Replaces:  aspirin 81 MG chewable tablet   Fish Oil 600 MG Caps Take 1,800 mg by mouth every morning.   Metoprolol Tartrate 37.5 MG Tabs Take 37.5 mg by mouth 2 (two) times daily.   ONE-A-DAY MENS 50+ ADVANTAGE Tabs Take 1 tablet by mouth daily with breakfast.   pravastatin 40 MG tablet Commonly known as:  PRAVACHOL Take 40 mg by mouth every evening.   psyllium 58.6 % packet Commonly known as:  METAMUCIL Take 1 packet by mouth 2 (two) times daily.   tamsulosin 0.4 MG Caps capsule Commonly known as:  FLOMAX Take 0.8 mg by mouth daily.   traMADol 50 MG tablet Commonly known as:  ULTRAM Take 50 mg by mouth every 4-6 hours PRN severe pain.      The patient has been discharged on:   1.Beta Blocker:  Yes [ x  ]  No   [   ]                              If No, reason:  2.Ace Inhibitor/ARB: Yes [   ]                                     No  [ x   ]                                     If No, reason: titration of BB, labile BP  3.Statin:   Yes [ x  ]                  No  [   ]                  If No, reason:  4.Ecasa:  Yes  [ x  ]                  No   [   ]                  If No, reason:    Follow-up Information    Lajean Manes, MD. Call.   Specialty:  Internal Medicine Why:  Call for a follow up appointment regarding further surveillance of pre op HGA1C 5.7 (pre diabetes) Contact information: [address]  [place] [address] [phone]        Gaye Pollack, MD Follow up.   Specialty:  Cardiothoracic Surgery Why:  Your appointment is on August 8th at 10:00am. Please arrive at 9:30am for a chest xray located at [facility] on the first floor of our building.  Contact information: [address] [place] [address] [phone]        nursing appointment Follow up.   Why:  Your suture removal appointment is on 08/26/2016 at 10:15am.        Barrett, Evelene Croon, PA-C Follow up.   Specialties:  Cardiology, Radiology Why:  [name], PA-C 7/25 @8am  ([facility])  Contact information: [address] [place] [address] [phone]           Signed: Nani Skillern PA-C 08/19/2016, 8:19 AM

## 2016-08-18 NOTE — Plan of Care (Signed)
Problem: Tissue Perfusion: Goal: Risk factors for ineffective tissue perfusion will decrease Outcome: Progressing Maintains oxygen saturation on roomair   Problem: Activity: Goal: Risk for activity intolerance will decrease Outcome: Progressing Walks in hallways with standby assist with no dyspnea or increase in pain   Problem: Bowel/Gastric: Goal: Will not experience complications related to bowel motility Outcome: Progressing Receiving Dulcolax

## 2016-08-19 DIAGNOSIS — I25118 Atherosclerotic heart disease of native coronary artery with other forms of angina pectoris: Secondary | ICD-10-CM

## 2016-08-19 MED ORDER — POTASSIUM CHLORIDE CRYS ER 20 MEQ PO TBCR
20.0000 meq | EXTENDED_RELEASE_TABLET | Freq: Once | ORAL | Status: DC
  Filled 2016-08-19: qty 1

## 2016-08-19 MED ORDER — METOPROLOL TARTRATE 25 MG PO TABS
37.5000 mg | ORAL_TABLET | Freq: Two times a day (BID) | ORAL | Status: DC
  Filled 2016-08-19: qty 1

## 2016-08-19 MED ORDER — ASPIRIN 325 MG PO TBEC: 325.0000 mg | DELAYED_RELEASE_TABLET | Freq: Every day | ORAL | 0 refills | Status: DC

## 2016-08-19 MED ORDER — METOPROLOL TARTRATE 37.5 MG PO TABS: 37.5000 mg | ORAL_TABLET | Freq: Two times a day (BID) | ORAL | 1 refills | Status: DC

## 2016-08-19 MED ORDER — TRAMADOL HCL 50 MG PO TABS: ORAL_TABLET | ORAL | 0 refills | Status: DC

## 2016-08-19 MED ORDER — FUROSEMIDE 40 MG PO TABS
40.0000 mg | ORAL_TABLET | Freq: Once | ORAL | Status: DC
  Filled 2016-08-19: qty 1

## 2016-08-19 NOTE — Assessment & Plan Note (Signed)
CABG x 2: LIMA to LAD, SVG to OM1

## 2016-08-19 NOTE — Progress Notes (Signed)
Discharged to home with family office visits in place teaching done  

## 2016-08-19 NOTE — Discharge Instructions (Signed)

## 2016-08-19 NOTE — Progress Notes (Addendum)
      [address]       [place] [address]             [phone]        5 Days Post-Op Procedure(s) (LRB): CORONARY ARTERY BYPASS GRAFTING (CABG)x2, using left internal mammary artery and right greater saphenous vein harvested endoscopically (N/A) TRANSESOPHAGEAL ECHOCARDIOGRAM (TEE) (N/A)  Subjective: Patient had large bowel movement yesterday. Has no complaints. He wants to go home.  Objective: Vital signs in last 24 hours: Temp:  [98.2 F (36.8 C)-99.1 F (37.3 C)] 99.1 F (37.3 C) (07/04 0512) Pulse Rate:  [91-99] 94 (07/04 0512) Cardiac Rhythm: Sinus tachycardia (07/04 0700) Resp:  [18] 18 (07/04 0512) BP: (109-135)/(60-75) 128/70 (07/04 0512) SpO2:  [95 %-97 %] 95 % (07/04 0512) Weight:  [89.6 kg (197 lb 9.6 oz)] 89.6 kg (197 lb 9.6 oz) (07/04 0512)  Pre op weight 90.6 kg Current Weight  08/19/16 89.6 kg (197 lb 9.6 oz)      Intake/Output from previous day: 07/03 0701 - 07/04 0700 In: -  Out: 300 [Urine:300]   Physical Exam:  Cardiovascular: Slightly tachycardic Pulmonary: Clear to auscultation bilaterally Abdomen: Soft, non tender, bowel sounds present. Extremities: No lower extremity edema. Wounds: Clean and dry.  No erythema or signs of infection.  Lab Results: CBC:No results for input(s): WBC, HGB, HCT, PLT in the last 72 hours. BMET: No results for input(s): NA, K, CL, CO2, GLUCOSE, BUN, CREATININE, CALCIUM in the last 72 hours.  PT/INR:  Lab Results  Component Value Date   INR 1.39 08/14/2016   INR 1.04 08/11/2016   INR 1.06 05/25/2011   ABG:  INR: Will add last result for INR, ABG once components are confirmed Will add last 4 CBG results once components are confirmed  Assessment/Plan:  1. CV - S/p NSTEMI. Occasional ST in the 100's.  On Lopressor 25 mg bid but will increase to 37.5 mg bid for better HR control. 2.  Pulmonary - On room air. Encourage incentive spirometer. 3.  Acute blood loss anemia - Last H and H  stable at 11.3 and 35.5 4. Chest tube sutures to remain-will remove in office after discharge. 5. Will discharge today-at least 3 hours after EPW removed.  ZIMMERMAN,DONIELLE MPA-C 08/19/2016,7:54 AM

## 2016-08-19 NOTE — Care Management Note (Signed)
Case Management Note Previous CM note initiated by Bethena Roys, RN--08/14/2016, 11:44 AM   Patient Details  Name: Marc Gilbert MRN: [id number] Date of Birth: [birth year]  Subjective/Objective:   Pt presented for Chest Pain-Nstemi- post cath revealed Multivessel CAD. Plan for CABG 08-14-16. Pt is from home with wife-[facility].                 Action/Plan: CM will continue to monitor for disposition needs.   Expected Discharge Date:  08/19/16               Expected Discharge Plan:  [place]  In-House Referral:     Discharge planning Services  CM Consult  Post Acute Care Choice:  Durable Medical Equipment Choice offered to:  Patient, Spouse  DME Arranged:  [place] [facility] DME Agency:  [place] Arranged:  NA [place] Agency:  NA  Status of Service:  Completed, signed off  If discussed at Long Length of Stay Meetings, dates discussed:    Discharge Disposition: home/self care   Additional Comments:  08/19/16- 1000- Valentina Gu, CM- pt for d/c home today with wife- RW order placed- notified Santiago Glad with [facility] for DME need- RW to be delivered to room prior to discharge  08/18/16- 1500- Adhya Cocco rN, CM- spoke to pt and wife at bedside regarding questions on medications and d/c needs- explained to pt and wife that all medications would be reviewed on discharge and any new scripts needed given. Pt also states that he would like a RW for home- [facility] does not have one available to borrow- have requested order from MD for discharge.   Dahlia Client [place], RN 08/19/2016, 10:45 AM [phone]

## 2016-08-20 ENCOUNTER — Telehealth (INDEPENDENT_AMBULATORY_CARE_PROVIDER_SITE_OTHER): Payer: Self-pay | Admitting: Orthopaedic Surgery

## 2016-08-20 ENCOUNTER — Telehealth: Payer: Self-pay | Admitting: Cardiology

## 2016-08-20 ENCOUNTER — Other Ambulatory Visit (INDEPENDENT_AMBULATORY_CARE_PROVIDER_SITE_OTHER): Payer: Self-pay

## 2016-08-20 MED ORDER — CEPHALEXIN 500 MG PO CAPS: 500.0000 mg | ORAL_CAPSULE | Freq: Four times a day (QID) | ORAL | 0 refills | Status: DC

## 2016-08-20 MED ORDER — ATORVASTATIN CALCIUM 40 MG PO TABS: 40.0000 mg | ORAL_TABLET | Freq: Every day | ORAL | 3 refills | Status: DC

## 2016-08-20 NOTE — Telephone Encounter (Signed)
Aware of recommendations-new rx sent to preferred pharmacy.  Verbalized understanding.  Advised to call with further questions or concerns.

## 2016-08-20 NOTE — Telephone Encounter (Signed)
New Message  Pt call requesting to speak with RN about his discharge instructions and his current medication list. Please call back to discuss

## 2016-08-20 NOTE — Telephone Encounter (Signed)
Patient's wife called stating that he has an upcoming dental appointment and is needing a prescription for antibiotics.  She is requesting 30 pills.  YO#[phone].  Thank you.

## 2016-08-20 NOTE — Telephone Encounter (Signed)
Continue off of losartan.  I would like for him to be on Lipitor 40 mg daily sig one po disp number 90 with 3 refills.

## 2016-08-20 NOTE — Telephone Encounter (Signed)
New Message ° ° pt verbalized that she is returning call for rn °

## 2016-08-20 NOTE — Telephone Encounter (Signed)
Spoke w patient. He has 2 questions regarding med clarification from hospital discharge.  Pt's 1st question/concern: When he spoke w Dr. Percival Spanish, he was under the impression that pravastatin would be switched to lipitor. Pt was discharged w same rx (continue pravastatin). Notes he's been on this for a long time, reports no SEs. Does he need to switch to lipitor?  2nd question was in regards to losartan. He states this was discontinued at discharge, he wants to make sure that is correct - I cannot find notes indicating why this was d/c'ed but I advised patient to adhere to med list from discharge, and to check BPs at home.  He voices agreement, will do so and call us if concerns/ for BP elevations. Aware I will route for MD advice and return call w further recommendations.

## 2016-08-20 NOTE — Telephone Encounter (Signed)
Called in Keflex

## 2016-08-20 NOTE — Telephone Encounter (Signed)
Returned call, phone rings w no answer or VM pick-up.

## 2016-08-21 NOTE — Op Note (Signed)
CARDIOVASCULAR SURGERY OPERATIVE NOTE  08/14/2016  Surgeon:  Gaye Pollack, MD  First Assistant: Nicholes Rough, PA-C   Preoperative Diagnosis:  Left main and LAD coronary artery disease   Postoperative Diagnosis:  Same   Procedure:  1. Median Sternotomy 2. Extracorporeal circulation 3.   Coronary artery bypass grafting x 2   Left internal mammary graft to the LAD  SVG to OM   4.   Endoscopic vein harvest from the right leg   Anesthesia:  General Endotracheal   Clinical History/Surgical Indication:  The patient is an active 83 year old gentleman with a history of hypertension, hyperlipidemia, remote smoke and mild COPD, and stage III CKD who presented with new onset substernal chest pain radiating down left arm. He had some prior chest pain in July and October 2017 and was seen in the ER and had a low risk myoview study. He had remained active at [facility] doing aerobic exercise and weight lifting. He was suppose to leave on a vacation to the [place] for a river boat cruise with his wife but had 3 episodes of this chest pain associated with shortness of breath and diaphoresis. These episodes occurred with minimal activity. His initial troponin was negative and peaked at 0.06. ECG showed a possible old anterior MI with no acute changes. Cath yesterday showed a 60% calcified distal LM with 95% proximal to mid LAD stenosis within a heavily calcified region. The LCX had a small first OM with 90% stenosis and a larger second OM. The RCA was dominant, large and widely patent. LVEF was 45-50% with mid to distal anterior hypokinesis and mildly elevated LVEDP of 18. An echo showed an LVEF of 55-60% with normal LV systolic function. He has remained free of further symptoms on heparin and NTG drips.  I have personally reviewed and interpreted his echo and cath images. This active 83 year old gentleman  has 60% calcified distal LM and 95% proximal to mid LAD stenosis that is calcified with preserved LV function and presents with stuttering angina and a NSTEMI with minimally elevated troponin. I agree that CABG is the best treatment for this patient given the location and calcification, anatomy of his stenoses.  I discussed the operative procedure with the patient and his daughter who is a physician by phone including alternatives, benefits and risks; including but not limited to bleeding, blood transfusion, infection, stroke, myocardial infarction, graft failure, heart block requiring a permanent pacemaker, organ dysfunction, and death.  [name] understands and agrees to proceed.   Preparation:  The patient was seen in the preoperative holding area and the correct patient, correct operation were confirmed with the patient after reviewing the medical record and catheterization. The consent was signed by me. Preoperative antibiotics were given. A pulmonary arterial line and radial arterial line were placed by the anesthesia team. The patient was taken back to the operating room and positioned supine on the operating room table. After being placed under general endotracheal anesthesia by the anesthesia team a foley catheter was placed. The neck, chest, abdomen, and both legs were prepped with betadine soap and solution and draped in the usual sterile manner. A surgical time-out was taken and the correct patient and operative procedure were confirmed with the nursing and anesthesia staff.   Cardiopulmonary Bypass:  A median sternotomy was performed. The pericardium was opened in the midline. Right ventricular function appeared normal. The ascending aorta was of normal size and had no palpable plaque. There were no contraindications  to aortic cannulation or cross-clamping. The patient was fully systemically heparinized and the ACT was maintained > 400 sec. The proximal aortic arch was cannulated with a  20 F aortic cannula for arterial inflow. Venous cannulation was performed via the right atrial appendage using a two-staged venous cannula. An antegrade cardioplegia/vent cannula was inserted into the mid-ascending aorta. Aortic occlusion was performed with a single cross-clamp. Systemic cooling to 34 degrees Centigrade and topical cooling of the heart with iced saline were used. Hyperkalemic antegrade cold blood cardioplegia was used to induce diastolic arrest and was then given at about 20 minute intervals throughout the period of arrest to maintain myocardial temperature at or below 10 degrees centigrade. A temperature probe was inserted into the interventricular septum and an insulating pad was placed in the pericardium.   Left internal mammary harvest:  The left side of the sternum was retracted using the Rultract retractor. The left internal mammary artery was harvested as a pedicle graft. All side branches were clipped. It was a medium-sized vessel of good quality with excellent blood flow. It was ligated distally and divided. It was sprayed with topical papaverine solution to prevent vasospasm.   Endoscopic vein harvest:  The right greater saphenous vein was harvested endoscopically through a 2 cm incision medial to the right knee. It was harvested from the thigh. It was a medium-sized vein of good quality. The side branches were all ligated with 4-0 silk ties.    Coronary arteries:  The coronary arteries were examined.   LAD:  Large vessel with mild distal disease  LCX:  Moderate sized OM with no distal disease   Grafts:  1. LIMA to the LAD: 2.5 mm. It was sewn end to side using 8-0 prolene continuous suture. 2. SVG to OM:  1.75 mm. It was sewn end to side using 7-0 prolene continuous suture.    The proximal vein graft anastomosis was performed to the mid-ascending aorta using continuous 6-0 prolene suture. A graft marker was placed around the proximal  anastomosis.   Completion:  The patient was rewarmed to 37 degrees Centigrade. The clamp was removed from the LIMA pedicle and there was rapid warming of the septum and return of ventricular fibrillation.  There was spontaneous return of sinus rhythm. The distal and proximal anastomoses were checked for hemostasis. The position of the grafts was satisfactory. Two temporary epicardial pacing wires were placed on the right atrium and two on the right ventricle. The patient was weaned from CPB without difficulty on no inotropes. TEE showed normal LV function. Heparin was fully reversed with protamine and the aortic and venous cannulas removed. Hemostasis was achieved. Mediastinal and left pleural drainage tubes were placed. The sternum was closed with double #6 stainless steel wires. The fascia was closed with continuous # 1 vicryl suture. The subcutaneous tissue was closed with 2-0 vicryl continuous suture. The skin was closed with 3-0 vicryl subcuticular suture. All sponge, needle, and instrument counts were reported correct at the end of the case. Dry sterile dressings were placed over the incisions and around the chest tubes which were connected to pleurevac suction. The patient was then transported to the surgical intensive care unit in critical but stable condition.

## 2016-08-25 DIAGNOSIS — N183 Chronic kidney disease, stage 3 (moderate): Secondary | ICD-10-CM | POA: Diagnosis not present

## 2016-08-25 DIAGNOSIS — Z79899 Other long term (current) drug therapy: Secondary | ICD-10-CM | POA: Diagnosis not present

## 2016-08-25 DIAGNOSIS — I214 Non-ST elevation (NSTEMI) myocardial infarction: Secondary | ICD-10-CM | POA: Diagnosis not present

## 2016-08-25 DIAGNOSIS — I129 Hypertensive chronic kidney disease with stage 1 through stage 4 chronic kidney disease, or unspecified chronic kidney disease: Secondary | ICD-10-CM | POA: Diagnosis not present

## 2016-08-25 DIAGNOSIS — E7800 Pure hypercholesterolemia, unspecified: Secondary | ICD-10-CM | POA: Diagnosis not present

## 2016-08-26 ENCOUNTER — Telehealth (INDEPENDENT_AMBULATORY_CARE_PROVIDER_SITE_OTHER): Payer: Self-pay | Admitting: Orthopaedic Surgery

## 2016-08-26 ENCOUNTER — Ambulatory Visit (INDEPENDENT_AMBULATORY_CARE_PROVIDER_SITE_OTHER): Payer: Self-pay

## 2016-08-26 DIAGNOSIS — Z4802 Encounter for removal of sutures: Secondary | ICD-10-CM

## 2016-08-26 NOTE — Telephone Encounter (Signed)
Baldo Ash will call from [place].

## 2016-08-26 NOTE — Telephone Encounter (Signed)
Pharmacy calling for patient. Patient would like to know if Cephalexin 5mg  is for dental work. Please call pharmacy to advise.

## 2016-08-26 NOTE — Progress Notes (Signed)
Removed 3 sutures from chest tube sites with no signs of infection and patient tolerated well. 

## 2016-08-31 ENCOUNTER — Encounter (HOSPITAL_COMMUNITY): Payer: Self-pay | Admitting: Surgery

## 2016-08-31 NOTE — Addendum Note (Signed)
Addendum  created 08/31/16 1429 by Belinda Block, MD   Anesthesia Event edited, Anesthesia Staff edited

## 2016-09-09 ENCOUNTER — Encounter: Payer: Self-pay | Admitting: Physician Assistant

## 2016-09-09 ENCOUNTER — Ambulatory Visit (INDEPENDENT_AMBULATORY_CARE_PROVIDER_SITE_OTHER): Payer: Medicare Other | Admitting: Physician Assistant

## 2016-09-09 VITALS — BP 138/76 | HR 103 | Ht 71.5 in | Wt 195.6 lb

## 2016-09-09 DIAGNOSIS — I2510 Atherosclerotic heart disease of native coronary artery without angina pectoris: Secondary | ICD-10-CM

## 2016-09-09 DIAGNOSIS — Z951 Presence of aortocoronary bypass graft: Secondary | ICD-10-CM | POA: Diagnosis not present

## 2016-09-09 DIAGNOSIS — E785 Hyperlipidemia, unspecified: Secondary | ICD-10-CM

## 2016-09-09 DIAGNOSIS — I10 Essential (primary) hypertension: Secondary | ICD-10-CM | POA: Diagnosis not present

## 2016-09-09 MED ORDER — METOPROLOL TARTRATE 37.5 MG PO TABS: 37.5000 mg | ORAL_TABLET | Freq: Two times a day (BID) | ORAL | 3 refills | Status: DC

## 2016-09-09 MED ORDER — ATORVASTATIN CALCIUM 40 MG PO TABS: 40.0000 mg | ORAL_TABLET | Freq: Every day | ORAL | 3 refills | Status: DC

## 2016-09-09 NOTE — Progress Notes (Signed)
Cardiology Office Note   Date:  09/09/2016   ID:  Marc Gilbert, DOB [birth year], MRN [id number]  PCP:  Lajean Manes, MD  Cardiologist:  Dr Percival Spanish, 08/10/2016  Marc Ferries, PA-C   Chief Complaint  Patient presents with  . Follow-up    History of Present Illness: Marc Gilbert is a 83 y.o. male with a history of HTN, HLD, GERD, COPD, CKD stage III, and chronic back pain.   08/10/2016 admitted w/ CP>>CABG 06/29 w/ LIMA-LAD and SVG-OM, d/c 08/19/2016  [name] presents for cardiology follow up.  He is doing well, is walking 30" per day. He wants to do cardiac rehab in [facility].   He is compliant with his medications and healthy diet.   He has not yet taken his medications today.   He has 2-3 cups of coffee daily (but only 1-2 of these are caffeinated), 4 glasses of water daily. He denies orthostatic symptoms.   He wants to know who will refill his meds.   He wants to know if he can swim or drive.   He is not having lower extremity edema, denies orthopnea or PND. He has not had anginal chest pain.  He has numbness in the last 2 fingers of his left hand and part of his left palm.  He has noticed an elevated heart rate when he checks his blood pressure which he does daily. He has not been running any fevers, no signs of infection at his incisions, no shortness of breath.   Past Medical History:  Diagnosis Date  . Arthritis    "right hand; back" (08/30/2015)  . Cancer (HCC)    skin  squamous and basal  . Chronic lower back pain   . CKD (chronic kidney disease), stage III   . COPD (chronic obstructive pulmonary disease) (HCC)    no home O2  . Dysplastic colon polyp age 89   carcinoma in situ  . GERD (gastroesophageal reflux disease)    occ  . H/O hiatal hernia   . Hyperlipidemia   . Hypertension   . Prostate cancer ([place]) 07/2014   active surveillance, Glisson 6    Past Surgical History:  Procedure Laterality Date  . CATARACT EXTRACTION W/  INTRAOCULAR LENS IMPLANT Left 08/27/15  . CORONARY ARTERY BYPASS GRAFT N/A 08/14/2016   Procedure: CORONARY ARTERY BYPASS GRAFTING (CABG)x2, using left internal mammary artery and right greater saphenous vein harvested endoscopically;  Surgeon: Gaye Pollack, MD;  Location: [place] OR;  Service: Open Heart Surgery;  Laterality: N/A;  . JOINT REPLACEMENT    . LEFT HEART CATH AND CORONARY ANGIOGRAPHY N/A 08/11/2016   Procedure: Left Heart Cath and Coronary Angiography;  Surgeon: Belva Crome, MD;  Location: [place] [facility];  Service: Cardiovascular;  Laterality: N/A;  . MOHS SURGERY     multiple SCC  . PROSTATE BIOPSY  07/2014  . TEE WITHOUT CARDIOVERSION N/A 08/14/2016   Procedure: TRANSESOPHAGEAL ECHOCARDIOGRAM (TEE);  Surgeon: Gaye Pollack, MD;  Location: [place];  Service: Open Heart Surgery;  Laterality: N/A;  . TONSILLECTOMY    . TOTAL HIP ARTHROPLASTY  05/26/2011   Procedure: TOTAL HIP ARTHROPLASTY;  Surgeon: Garald Balding, MD;  Location: [place];  Service: Orthopedics;  Laterality: Right;  . ULTRASOUND GUIDANCE FOR VASCULAR ACCESS  08/11/2016   Procedure: Ultrasound Guidance For Vascular Access;  Surgeon: Belva Crome, MD;  Location: [place] [facility];  Service: Cardiovascular;;    Current Outpatient Prescriptions  Medication Sig Dispense Refill  . acetaminophen (TYLENOL) 500 MG tablet Take 500-1,000 mg by mouth every 6 (six) hours as needed for mild pain or moderate pain.    Marland Kitchen aspirin EC 325 MG EC tablet Take 1 tablet (325 mg total) by mouth daily. 30 tablet 0  . atorvastatin (LIPITOR) 40 MG tablet Take 1 tablet (40 mg total) by mouth daily. 90 tablet 3  . Metoprolol Tartrate 37.5 MG TABS Take 37.5 mg by mouth 2 (two) times daily. 180 tablet 3  . Multiple Vitamins-Minerals (ONE-A-DAY MENS 50+ ADVANTAGE) TABS Take 1 tablet by mouth daily with breakfast.    . Omega-3 Fatty Acids (FISH OIL) 600 MG CAPS Take 1,800 mg by mouth every morning.     . psyllium (METAMUCIL) 58.6 % packet Take 1  packet by mouth 2 (two) times daily.     . Tamsulosin HCl (FLOMAX) 0.4 MG CAPS Take 0.8 mg by mouth daily.     . traMADol (ULTRAM) 50 MG tablet Take 50 mg by mouth every 4-6 hours PRN severe pain. 30 tablet 0   No current facility-administered medications for this visit.     Allergies:   No known allergies    Social History:  The patient  reports that he quit smoking about 28 years ago. His smoking use included Cigarettes. He has a 12.50 pack-year smoking history. He has never used smokeless tobacco. He reports that he drinks about 6.0 oz of alcohol per week . He reports that he does not use drugs.   Family History:  The patient's family history includes Cancer in his mother; Diabetes in his father.    ROS:  Please see the history of present illness. All other systems are reviewed and negative.    PHYSICAL EXAM: VS:  BP 138/76 (BP Location: Left Arm, Cuff Size: Normal)   Pulse (!) 103   Ht 5' 11.5" (1.816 m)   Wt 195 lb 9.6 oz (88.7 kg)   BMI 26.90 kg/m  , BMI Body mass index is 26.9 kg/m. GEN: Well nourished, well developed, male in no acute distress  HEENT: normal for age  Neck: no JVD, no carotid bruit, no masses Cardiac: RRR; no murmur, no rubs, or gallops Respiratory:  clear to auscultation bilaterally, normal work of breathing GI: soft, nontender, nondistended, + BS MS: no deformity or atrophy; no edema; distal pulses are 2+ in all 4 extremities   Skin: warm and dry, no rash; incisions are healing well with no signs of infection Neuro:  Strength and sensation are intact Psych: euthymic mood, full affect   EKG:  EKG is ordered today. The ekg ordered today demonstrates sinus tachycardia, rate 103.  Incomplete right bundle branch block is old. T waves in V1 and V2 are now inverted, T wave changes in V3 also noted  ECHO: 08/11/2016 - Left ventricle: The cavity size was normal. Wall thickness was   normal. Systolic function was normal. The estimated ejection   fraction  was in the range of 55% to 60%. Incoordinate septal   motion. Doppler parameters are consistent with abnormal left   ventricular relaxation (grade 1 diastolic dysfunction). The E/e&'   ratio is <8, suggesting normal LV filling pressure. - Aortic valve: Sclerosis without stenosis. There was no regurgitation. - Aorta: Mildly dilated aortic root. Aortic root dimension: 39 mm (ED). - Mitral valve: Mildly thickened leaflets . There was trivial regurgitation. - Left atrium: The atrium was normal in size. - Right ventricle: The cavity size was mildly  dilated. Moderate   systolic dysfunction. Lateral annulus peak S velocity: 5.33 cm/s. - Pulmonary arteries: PA peak pressure: 32 mm Hg (S). - Inferior vena cava: The vessel was normal in size. The   respirophasic diameter changes were in the normal range (>= 50%),   consistent with normal central venous pressure. Impressions: - Compared to a prior study in 08/2015, the LVEF has improved to   55-60%. There is RV is mildly dilated with moderate systolic dysfunction.   Recent Labs: 08/15/2016: Magnesium 2.1 08/16/2016: BUN 14; Creatinine, Ser 1.24; Hemoglobin 11.3; Platelets 137; Potassium 3.4; Sodium 131    Lipid Panel    Component Value Date/Time   CHOL 157 08/10/2016 1302   TRIG 72 08/10/2016 1302   HDL 56 08/10/2016 1302   CHOLHDL 2.8 08/10/2016 1302   VLDL 14 08/10/2016 1302   LDLCALC 87 08/10/2016 1302     Wt Readings from Last 3 Encounters:  09/09/16 195 lb 9.6 oz (88.7 kg)  08/19/16 197 lb 9.6 oz (89.6 kg)  11/30/15 195 lb 9.6 oz (88.7 kg)     Other studies Reviewed: Additional studies/ records that were reviewed today include: Hospital records and testing.  ASSESSMENT AND PLAN:  1.  CAD, s/p CABG: He is doing well postoperatively. We will make sure he gets referred to cardiac rehabilitation at [facility]. He is on full dose aspirin, high-dose Lipitor and beta blocker. We will refill the statin and the beta blocker. As he is  having no symptoms and increasing his activities well, I believe the T-wave changes are related to the surgery.  2. HTN: His BP readings at home are in the 110s at times, so will not increase his metoprolol. He will continue to track his blood pressure, including after he has taken his medications. He will include a heart rate. This will determine if we are able to increase the metoprolol up to 50 mg twice a day or not.  3. Dyslipidemia: His goal LDL is 70. He is on Lipitor 40. Prior to his next visit with Dr. Percival Spanish, check complete metabolic profile and lipid profile, it is fine for his PCP to do this.   Current medicines are reviewed at length with the patient today.  The patient has concerns regarding medicines. Concerns were addressed  The following changes have been made:  no change  Labs/ tests ordered today include:   Orders Placed This Encounter  Procedures  . EKG 12-Lead     Disposition:   FU with Dr. Percival Spanish  Signed, Marc Ferries, PA-C  09/09/2016 11:13 AM    [place] Phone: [phone]; Fax: [phone]  This note was written with the assistance of speech recognition software. Please excuse any transcriptional errors.

## 2016-09-09 NOTE — Patient Instructions (Signed)
Medication Instructions:  Your physician recommends that you continue on your current medications as directed. Please refer to the Current Medication list given to you today. If you need a refill on your cardiac medications before your next appointment, please call your pharmacy.  Labwork: Have PCP FAX LAB RESULTS TO [phone]   Follow-Up: Your physician wants you to follow-up in: 3 MONTHS WITH DR [facility]   Special Instructions: LOG bp AND CALL AFTER 5 READINGS REFER TO HEARTSTRIDES FOR CARDIAC REHAB   Thank you for choosing [facility] at [facility]!!

## 2016-09-14 ENCOUNTER — Telehealth: Payer: Self-pay | Admitting: Physician Assistant

## 2016-09-14 NOTE — Telephone Encounter (Signed)
Message routed to [place], [place] - seen on 09/09/16 by [place], [place]

## 2016-09-14 NOTE — Telephone Encounter (Signed)
Patient calling, states that he was instructed to report BP readings by [name]. The first four readings were taken around 10:30am and the last two around 2 pm. All readings were taken after medications.  1. 127/79 heart rate 90 2. 126/75 heart rate 78 3. 121/78 heart rate 81 4. 130/79 heart rate 79  5. 122/63 heart rate 91 6. 133/82 heart rate 91

## 2016-09-16 NOTE — Telephone Encounter (Signed)
If the readings he sent are after he increased the metoprolol to 37.5 mg bid, ok to increase to 50 mg bid.  If he does not tolerate that dose, go back to 37.5 mg bid. Thanks

## 2016-09-17 ENCOUNTER — Telehealth: Payer: Self-pay | Admitting: Physician Assistant

## 2016-09-17 DIAGNOSIS — I200 Unstable angina: Secondary | ICD-10-CM | POA: Insufficient documentation

## 2016-09-17 DIAGNOSIS — Z951 Presence of aortocoronary bypass graft: Secondary | ICD-10-CM | POA: Insufficient documentation

## 2016-09-17 DIAGNOSIS — I25118 Atherosclerotic heart disease of native coronary artery with other forms of angina pectoris: Secondary | ICD-10-CM | POA: Insufficient documentation

## 2016-09-17 NOTE — Telephone Encounter (Signed)
Returned call to [place].Left message on personal voice mail to refax cardiac rehab order.I will send message to Dr.Hochrein's CMA Nya to get Dr.Hochrein's signature and fax back to you.

## 2016-09-17 NOTE — Telephone Encounter (Signed)
New message      Calling to get status on an order form to start cardiac rehab.  It was faxed to our office needing a physician signature.  It was signed by Suanne Marker but need a physician signature.  Please have doctor sign form and fax to [phone].  If there is a problem, please call

## 2016-09-17 NOTE — Telephone Encounter (Signed)
Spoke w/patient who reports BP readings are on metoprolol tartrate 37.5mg  BID. Advised to increase to 50mg  BID per Suanne Marker, [place] note. He will take 12.5mg  (1/2 tab of 25mg ) of an old prescription in addition to 37.5mg  tablets he received from [facility] mail order pharmacy and monitor BP/HR and call back next week with update on readings + how he tolerates dose. If he is doing fine, will send in Rx for metoprolol tartrate 50mg  BID.   Routed to [place], [place] as [place]

## 2016-09-18 ENCOUNTER — Other Ambulatory Visit: Payer: Self-pay | Admitting: Surgery

## 2016-09-18 DIAGNOSIS — B351 Tinea unguium: Secondary | ICD-10-CM | POA: Diagnosis not present

## 2016-09-18 DIAGNOSIS — Z951 Presence of aortocoronary bypass graft: Secondary | ICD-10-CM

## 2016-09-18 DIAGNOSIS — M79675 Pain in left toe(s): Secondary | ICD-10-CM | POA: Diagnosis not present

## 2016-09-18 DIAGNOSIS — M79674 Pain in right toe(s): Secondary | ICD-10-CM | POA: Diagnosis not present

## 2016-09-18 NOTE — Telephone Encounter (Signed)
Received order awaiting Dr Percival Spanish signature

## 2016-09-21 ENCOUNTER — Ambulatory Visit (INDEPENDENT_AMBULATORY_CARE_PROVIDER_SITE_OTHER): Payer: Self-pay | Admitting: Physician Assistant

## 2016-09-21 ENCOUNTER — Ambulatory Visit
Admission: RE | Admit: 2016-09-21 | Discharge: 2016-09-21 | Disposition: A | Discharge status: Home / Self Care | Payer: Medicare Other | Source: Ambulatory Visit | Attending: Surgery | Admitting: Surgery

## 2016-09-21 VITALS — BP 128/79 | HR 91 | Resp 16 | Ht 71.5 in | Wt 196.0 lb

## 2016-09-21 DIAGNOSIS — J90 Pleural effusion, not elsewhere classified: Secondary | ICD-10-CM | POA: Diagnosis not present

## 2016-09-21 DIAGNOSIS — I2510 Atherosclerotic heart disease of native coronary artery without angina pectoris: Secondary | ICD-10-CM

## 2016-09-21 DIAGNOSIS — Z951 Presence of aortocoronary bypass graft: Secondary | ICD-10-CM

## 2016-09-21 NOTE — Progress Notes (Signed)
Marc Gilbert is a 83 y.o. male patient s/p CABG x 2 on 08/14/2016 with Dr. Cyndia Gilbert.   1. CAD, multiple vessel   2. S/P CABG x 2    Past Medical History:  Diagnosis Date  . Arthritis    "right hand; back" (08/30/2015)  . Cancer (HCC)    skin  squamous and basal  . Chronic lower back pain   . CKD (chronic kidney disease), stage III   . COPD (chronic obstructive pulmonary disease) (HCC)    no home O2  . Dysplastic colon polyp age 55   carcinoma in situ  . GERD (gastroesophageal reflux disease)    occ  . H/O hiatal hernia   . Hyperlipidemia   . Hypertension   . Prostate cancer ([place]) 07/2014   active surveillance, Glisson 6   No past surgical history pertinent negatives on file. Scheduled Meds: Current Outpatient Prescriptions on File Prior to Visit  Medication Sig Dispense Refill  . acetaminophen (TYLENOL) 500 MG tablet Take 500-1,000 mg by mouth every 6 (six) hours as needed for mild pain or moderate pain.    Marland Kitchen aspirin EC 325 MG EC tablet Take 1 tablet (325 mg total) by mouth daily. 30 tablet 0  . atorvastatin (LIPITOR) 40 MG tablet Take 1 tablet (40 mg total) by mouth daily. 90 tablet 3  . Metoprolol Tartrate 37.5 MG TABS Take 37.5 mg by mouth 2 (two) times daily. (Patient taking differently: Take 50 mg by mouth 2 (two) times daily. ) 180 tablet 3  . Multiple Vitamins-Minerals (ONE-A-DAY MENS 50+ ADVANTAGE) TABS Take 1 tablet by mouth daily with breakfast.    . Omega-3 Fatty Acids (FISH OIL) 600 MG CAPS Take 1,800 mg by mouth every morning.     . psyllium (METAMUCIL) 58.6 % packet Take 1 packet by mouth 2 (two) times daily.     . Tamsulosin HCl (FLOMAX) 0.4 MG CAPS Take 0.8 mg by mouth daily.     . traMADol (ULTRAM) 50 MG tablet Take 50 mg by mouth every 4-6 hours PRN severe pain. (Patient not taking: Reported on 09/21/2016) 30 tablet 0   No current facility-administered medications on file prior to visit.   :  Allergies  Allergen Reactions  . No Known Allergies     Blood  pressure 128/79, pulse 91, resp. rate 16, height 5' 11.5" (1.816 m), weight 88.9 kg (196 lb), SpO2 95 %.  Subjective: Overall recovering well. He has been exercising at home for 30 minutes in a row walking on a treadmill. He has not experienced any sudden shortness of breath but during heavy activity he does become short of breath. He has had no chest pain but has had some incisional soreness. He shares that he has been coughing in the mornings which happened before surgery. His cardiologist increased his Marc Gilbert and since then he has had no dizziness or lightheadedness. He does notice that his heart rate does began to creep up towards the 8-12 hour mark after he takes his Marc Gilbert. His blood pressure remains well controlled at all times on the new dosage.  Objective  Cor: RRR, no murmur Pulm: CTA bilaterally Abd: soft, non-tender Ext: No edema Incisions: stable to palpation, c/d/i sternal incision. EVH site c/d/i   CLINICAL DATA:  83 y/o M; history of CABG, hypertension, prostate cancer, COPD, former smoker.  EXAM: CHEST  2 VIEW  COMPARISON:  08/16/2016 chest radiograph   FINDINGS: Stable cardiac silhouette given projection and technique. Post CABG and median sternotomy  with wires in alignment. Interval resolution of left-sided pleural effusion. Residual hazy left lung base opacity probably represents residual atelectasis. No new focal consolidation. Bones are unremarkable.  IMPRESSION: Interval resolution of left-sided pleural effusion and improved aeration of left lung base. Residual left lung base hazy opacity is probably atelectasis. No new focal consolidation.   Electronically Signed   By: Marc Gilbert M.D.   On: 09/21/2016 14:10  Assessment & Plan: Marc Gilbert is status post CABG 2 by Dr. Cyndia Gilbert on 08/14/2016. Today he is doing quite well. He has been exercising on his own for 30 minutes in a row on a treadmill. He does become short of breath with  activity. He has tried to lose some weight since his hospitalization. He asked about a water aerobics class and I shared that he should wait 3 months from surgery to participate. He was assigned some exercises for his hip and I shared that would be fine to resume those exercises. I suggested working on lower body exercises and keeping to a 5 pound weight limit on the upper body. I suggested avoiding pushing and pulling movements with the upper body. I shared that he could ride a stationary bike but should avoid a road bike. He may drive since he has no longer taking narcotic pain medication. I suggested driving with his wife in the car initially and not driving if he feels poorly. He has an evaluation for cardiac rehabilitation program tomorrow and plans to start cardiac rehabilitation in another week or so. He also plans to do additional exercise. We discussed his chest x-ray today which showed a small left-sided pleural effusion, no new focal consolidation, no pneumothorax. He does have COPD which I suggested following up with his primary care doctor. He does have a follow-up appointment with his cardiologist as they continue to titrate his beta blocker medication. He is discharged from our practice today that is welcome to make appointments as needed. He is to follow up with his cardiologist and his primary care provider. We are available at any time to call if there are questions or concerns that arise.  Marc Gilbert 09/21/2016

## 2016-09-21 NOTE — Telephone Encounter (Signed)
F/u Message  Jasmine call to f/u on MD signature. Please call back to discuss

## 2016-09-21 NOTE — Telephone Encounter (Signed)
Cardiac rehab form was signed and faxed to [facility]

## 2016-09-21 NOTE — Telephone Encounter (Signed)
Routed to [facility] for f/up

## 2016-09-21 NOTE — Patient Instructions (Signed)
You may return to driving an automobile as long as you are no longer requiring oral narcotic pain relievers during the daytime.  It would be wise to start driving only short distances during the daylight and gradually increase from there as you feel comfortable.  Make every effort to stay physically active, get some type of exercise on a regular basis, and stick to a "heart healthy diet".  The long term benefits for regular exercise and a healthy diet are critically important to your overall health and wellbeing.  Follow up as needed.

## 2016-09-22 DIAGNOSIS — Z951 Presence of aortocoronary bypass graft: Secondary | ICD-10-CM | POA: Diagnosis not present

## 2016-09-22 DIAGNOSIS — I2510 Atherosclerotic heart disease of native coronary artery without angina pectoris: Secondary | ICD-10-CM | POA: Diagnosis not present

## 2016-09-23 ENCOUNTER — Encounter: Payer: Self-pay | Admitting: Physician Assistant

## 2016-09-23 ENCOUNTER — Ambulatory Visit: Payer: Private Health Insurance - Indemnity | Admitting: Surgery

## 2016-09-24 DIAGNOSIS — Z951 Presence of aortocoronary bypass graft: Secondary | ICD-10-CM | POA: Diagnosis not present

## 2016-09-24 DIAGNOSIS — I2510 Atherosclerotic heart disease of native coronary artery without angina pectoris: Secondary | ICD-10-CM | POA: Diagnosis not present

## 2016-09-27 ENCOUNTER — Encounter: Payer: Self-pay | Admitting: Physician Assistant

## 2016-09-28 DIAGNOSIS — I2510 Atherosclerotic heart disease of native coronary artery without angina pectoris: Secondary | ICD-10-CM | POA: Diagnosis not present

## 2016-09-28 DIAGNOSIS — Z951 Presence of aortocoronary bypass graft: Secondary | ICD-10-CM | POA: Diagnosis not present

## 2016-09-30 DIAGNOSIS — I2510 Atherosclerotic heart disease of native coronary artery without angina pectoris: Secondary | ICD-10-CM | POA: Diagnosis not present

## 2016-09-30 DIAGNOSIS — Z951 Presence of aortocoronary bypass graft: Secondary | ICD-10-CM | POA: Diagnosis not present

## 2016-09-30 DIAGNOSIS — L570 Actinic keratosis: Secondary | ICD-10-CM | POA: Diagnosis not present

## 2016-10-01 DIAGNOSIS — Z951 Presence of aortocoronary bypass graft: Secondary | ICD-10-CM | POA: Diagnosis not present

## 2016-10-01 DIAGNOSIS — I2510 Atherosclerotic heart disease of native coronary artery without angina pectoris: Secondary | ICD-10-CM | POA: Diagnosis not present

## 2016-10-03 ENCOUNTER — Encounter: Payer: Self-pay | Admitting: Physician Assistant

## 2016-10-05 DIAGNOSIS — I2510 Atherosclerotic heart disease of native coronary artery without angina pectoris: Secondary | ICD-10-CM | POA: Diagnosis not present

## 2016-10-05 DIAGNOSIS — Z951 Presence of aortocoronary bypass graft: Secondary | ICD-10-CM | POA: Diagnosis not present

## 2016-10-07 ENCOUNTER — Other Ambulatory Visit: Payer: Self-pay | Admitting: Physician Assistant

## 2016-10-07 MED ORDER — METOPROLOL TARTRATE 37.5 MG PO TABS: 50.0000 mg | ORAL_TABLET | Freq: Two times a day (BID) | ORAL | 3 refills | Status: DC

## 2016-10-08 DIAGNOSIS — Z951 Presence of aortocoronary bypass graft: Secondary | ICD-10-CM | POA: Diagnosis not present

## 2016-10-08 DIAGNOSIS — I2510 Atherosclerotic heart disease of native coronary artery without angina pectoris: Secondary | ICD-10-CM | POA: Diagnosis not present

## 2016-10-09 ENCOUNTER — Encounter: Payer: Self-pay | Admitting: Physician Assistant

## 2016-10-09 ENCOUNTER — Telehealth: Payer: Self-pay | Admitting: Cardiology

## 2016-10-09 DIAGNOSIS — R351 Nocturia: Secondary | ICD-10-CM | POA: Diagnosis not present

## 2016-10-09 DIAGNOSIS — N401 Enlarged prostate with lower urinary tract symptoms: Secondary | ICD-10-CM | POA: Diagnosis not present

## 2016-10-09 DIAGNOSIS — C61 Malignant neoplasm of prostate: Secondary | ICD-10-CM | POA: Diagnosis not present

## 2016-10-09 DIAGNOSIS — N5201 Erectile dysfunction due to arterial insufficiency: Secondary | ICD-10-CM | POA: Diagnosis not present

## 2016-10-09 MED ORDER — METOPROLOL SUCCINATE ER 50 MG PO TB24: 50.0000 mg | ORAL_TABLET | Freq: Two times a day (BID) | ORAL | 5 refills | Status: DC

## 2016-10-09 MED ORDER — METOPROLOL TARTRATE 50 MG PO TABS: 50.0000 mg | ORAL_TABLET | Freq: Two times a day (BID) | ORAL | 3 refills | Status: DC

## 2016-10-09 NOTE — Telephone Encounter (Signed)
New Message      How are they to dispense  Metoprolol Tartrate 37.5 MG TABS Take 50 mg by mouth 2 (two) times daily.   They have two prescriptions instructions and they need to know which one to dispense

## 2016-10-09 NOTE — Telephone Encounter (Signed)
RX FOR METORPOLOL SUC 50 MG TWICE DAILY SENT TO [facility]   Barrett, Evelene Croon, PA-C  to [employer]   4:36 PM  Marc Marc Gilbert, I was on vacation last week and did not see your message till today.  I am happy to send the rx to [facility] and then mail order if you tolerate it well.  Forms for cardiac rehab have been sent in.  Hope you are continuing to recover,  Marc Gilbert   Last read by Marc Gilbert at 9:08 AM on 10/09/2016.  October 05, 2016  Marc Estimable, RN routed this conversation to Marc Georgia, PA-C   7:25 AM  Marc Laughter, LPN routed this conversation to [facility] Clinical Pool  October 03, 2016  Marc Gilbert  to Marc Georgia, PA-C    6:19 PM  Marc Gilbert,  I sent you a response to your last message but think it got lost in cyberspace because I cannot find on MyChart where I thought I sent it. So I will repeat my response.  Thank you for reviewing my concern. I like the idea of trying the long-acting metoprolol twice daily. I actually do get a bit symptomatic when my pulse goes over 90 and feel a "thunk." So I think keeping my pulse better controlled would be a good thing. I would like a 30 day supply of the long-acting Metoprolol sent to [facility] on [address] in [place] [facility] to see how it works. I can report back and then will need a 90 day supply with refills to my mail order pharmacy [facility]) once we decide which medication is best.  Thank you so much! Marc Gilbert   September 25, 2016  Barrett, Evelene Croon, [place]  to [employer]    1:38 PM  Marc Gilbert  If you want the most possible consistent metoprolol coverage, it would be taking the long-acting metoprolol twice a day.  However, if you are not symptomatic, a HR that elevates like that temporarily is not harmful. Nothing has to be done.  If you would like to change the medication, let me know. Doses would be the same.  Hope this helps,  [place]   Last read by Marc Gilbert at 6:20 PM on  10/03/2016.  September 23, 2016   12:12 PM  Gilbert, Marc Muss D, LPN routed this conversation to Barrett, Evelene Croon, PA-C    11:33 AM  Marc Racer, LPN routed this conversation to [facility]  Marc Gilbert  to Marc Georgia, PA-C     11:20 AM  Hi Marc Gilbert,  I have been on the increased dose of Metoprolol (50 mg. twice daily up from 37.5 mg) for the past 7 days. As you requested, here is my BP and P information since I have been on the increased dose.      BP range 113-128/66-78       P (average of two values) 73-82 with two additional readings of 91 & 93  I feel fine--tolerating the increase in med without dizziness or other side effects. My only concern is that at the end of the 12 hour period (before I take my next dose), my pulse increases from the 70-80 range to the 90's. Does this require a change in dose or frequency of my Metoprolol or should I not be concerned.   Thank you. [employer]

## 2016-10-09 NOTE — Telephone Encounter (Signed)
Refill sent to the pharmacy electronically.  

## 2016-10-12 DIAGNOSIS — Z951 Presence of aortocoronary bypass graft: Secondary | ICD-10-CM | POA: Diagnosis not present

## 2016-10-12 DIAGNOSIS — I2510 Atherosclerotic heart disease of native coronary artery without angina pectoris: Secondary | ICD-10-CM | POA: Diagnosis not present

## 2016-10-13 ENCOUNTER — Other Ambulatory Visit: Payer: Self-pay | Admitting: Physician Assistant

## 2016-10-14 DIAGNOSIS — I2510 Atherosclerotic heart disease of native coronary artery without angina pectoris: Secondary | ICD-10-CM | POA: Diagnosis not present

## 2016-10-14 DIAGNOSIS — Z951 Presence of aortocoronary bypass graft: Secondary | ICD-10-CM | POA: Diagnosis not present

## 2016-10-15 DIAGNOSIS — E7800 Pure hypercholesterolemia, unspecified: Secondary | ICD-10-CM | POA: Diagnosis not present

## 2016-10-15 DIAGNOSIS — I2510 Atherosclerotic heart disease of native coronary artery without angina pectoris: Secondary | ICD-10-CM | POA: Diagnosis not present

## 2016-10-15 DIAGNOSIS — Z951 Presence of aortocoronary bypass graft: Secondary | ICD-10-CM | POA: Diagnosis not present

## 2016-10-15 DIAGNOSIS — Z79899 Other long term (current) drug therapy: Secondary | ICD-10-CM | POA: Diagnosis not present

## 2016-10-16 DIAGNOSIS — Z961 Presence of intraocular lens: Secondary | ICD-10-CM | POA: Diagnosis not present

## 2016-10-16 DIAGNOSIS — H52203 Unspecified astigmatism, bilateral: Secondary | ICD-10-CM | POA: Diagnosis not present

## 2016-10-21 DIAGNOSIS — I2510 Atherosclerotic heart disease of native coronary artery without angina pectoris: Secondary | ICD-10-CM | POA: Diagnosis not present

## 2016-10-21 DIAGNOSIS — Z951 Presence of aortocoronary bypass graft: Secondary | ICD-10-CM | POA: Diagnosis not present

## 2016-10-21 DIAGNOSIS — Z87891 Personal history of nicotine dependence: Secondary | ICD-10-CM | POA: Diagnosis not present

## 2016-10-22 DIAGNOSIS — Z87891 Personal history of nicotine dependence: Secondary | ICD-10-CM | POA: Diagnosis not present

## 2016-10-22 DIAGNOSIS — I2510 Atherosclerotic heart disease of native coronary artery without angina pectoris: Secondary | ICD-10-CM | POA: Diagnosis not present

## 2016-10-22 DIAGNOSIS — Z951 Presence of aortocoronary bypass graft: Secondary | ICD-10-CM | POA: Diagnosis not present

## 2016-10-25 ENCOUNTER — Encounter: Payer: Self-pay | Admitting: Physician Assistant

## 2016-10-26 ENCOUNTER — Encounter: Payer: Self-pay | Admitting: Physician Assistant

## 2016-10-26 ENCOUNTER — Other Ambulatory Visit: Payer: Self-pay | Admitting: Physician Assistant

## 2016-10-26 DIAGNOSIS — Z87891 Personal history of nicotine dependence: Secondary | ICD-10-CM | POA: Diagnosis not present

## 2016-10-26 DIAGNOSIS — I2510 Atherosclerotic heart disease of native coronary artery without angina pectoris: Secondary | ICD-10-CM | POA: Diagnosis not present

## 2016-10-26 DIAGNOSIS — Z951 Presence of aortocoronary bypass graft: Secondary | ICD-10-CM | POA: Diagnosis not present

## 2016-10-26 MED ORDER — METOPROLOL SUCCINATE ER 50 MG PO TB24: 50.0000 mg | ORAL_TABLET | Freq: Two times a day (BID) | ORAL | 3 refills | Status: DC

## 2016-10-28 DIAGNOSIS — I2510 Atherosclerotic heart disease of native coronary artery without angina pectoris: Secondary | ICD-10-CM | POA: Diagnosis not present

## 2016-10-28 DIAGNOSIS — Z951 Presence of aortocoronary bypass graft: Secondary | ICD-10-CM | POA: Diagnosis not present

## 2016-10-28 DIAGNOSIS — Z87891 Personal history of nicotine dependence: Secondary | ICD-10-CM | POA: Diagnosis not present

## 2016-10-29 DIAGNOSIS — I2510 Atherosclerotic heart disease of native coronary artery without angina pectoris: Secondary | ICD-10-CM | POA: Diagnosis not present

## 2016-10-29 DIAGNOSIS — Z87891 Personal history of nicotine dependence: Secondary | ICD-10-CM | POA: Diagnosis not present

## 2016-10-29 DIAGNOSIS — Z951 Presence of aortocoronary bypass graft: Secondary | ICD-10-CM | POA: Diagnosis not present

## 2016-11-02 DIAGNOSIS — Z87891 Personal history of nicotine dependence: Secondary | ICD-10-CM | POA: Diagnosis not present

## 2016-11-02 DIAGNOSIS — Z951 Presence of aortocoronary bypass graft: Secondary | ICD-10-CM | POA: Diagnosis not present

## 2016-11-02 DIAGNOSIS — I2510 Atherosclerotic heart disease of native coronary artery without angina pectoris: Secondary | ICD-10-CM | POA: Diagnosis not present

## 2016-11-04 DIAGNOSIS — Z951 Presence of aortocoronary bypass graft: Secondary | ICD-10-CM | POA: Diagnosis not present

## 2016-11-04 DIAGNOSIS — I2510 Atherosclerotic heart disease of native coronary artery without angina pectoris: Secondary | ICD-10-CM | POA: Diagnosis not present

## 2016-11-04 DIAGNOSIS — Z87891 Personal history of nicotine dependence: Secondary | ICD-10-CM | POA: Diagnosis not present

## 2016-11-05 DIAGNOSIS — Z87891 Personal history of nicotine dependence: Secondary | ICD-10-CM | POA: Diagnosis not present

## 2016-11-05 DIAGNOSIS — I2510 Atherosclerotic heart disease of native coronary artery without angina pectoris: Secondary | ICD-10-CM | POA: Diagnosis not present

## 2016-11-05 DIAGNOSIS — Z951 Presence of aortocoronary bypass graft: Secondary | ICD-10-CM | POA: Diagnosis not present

## 2016-11-09 DIAGNOSIS — Z951 Presence of aortocoronary bypass graft: Secondary | ICD-10-CM | POA: Diagnosis not present

## 2016-11-09 DIAGNOSIS — I2510 Atherosclerotic heart disease of native coronary artery without angina pectoris: Secondary | ICD-10-CM | POA: Diagnosis not present

## 2016-11-09 DIAGNOSIS — Z87891 Personal history of nicotine dependence: Secondary | ICD-10-CM | POA: Diagnosis not present

## 2016-11-11 DIAGNOSIS — I2510 Atherosclerotic heart disease of native coronary artery without angina pectoris: Secondary | ICD-10-CM | POA: Diagnosis not present

## 2016-11-11 DIAGNOSIS — Z951 Presence of aortocoronary bypass graft: Secondary | ICD-10-CM | POA: Diagnosis not present

## 2016-11-11 DIAGNOSIS — Z87891 Personal history of nicotine dependence: Secondary | ICD-10-CM | POA: Diagnosis not present

## 2016-11-12 DIAGNOSIS — Z951 Presence of aortocoronary bypass graft: Secondary | ICD-10-CM | POA: Diagnosis not present

## 2016-11-12 DIAGNOSIS — Z87891 Personal history of nicotine dependence: Secondary | ICD-10-CM | POA: Diagnosis not present

## 2016-11-12 DIAGNOSIS — I2510 Atherosclerotic heart disease of native coronary artery without angina pectoris: Secondary | ICD-10-CM | POA: Diagnosis not present

## 2016-11-16 DIAGNOSIS — Z951 Presence of aortocoronary bypass graft: Secondary | ICD-10-CM | POA: Diagnosis not present

## 2016-11-18 DIAGNOSIS — Z951 Presence of aortocoronary bypass graft: Secondary | ICD-10-CM | POA: Diagnosis not present

## 2016-11-19 DIAGNOSIS — Z951 Presence of aortocoronary bypass graft: Secondary | ICD-10-CM | POA: Diagnosis not present

## 2016-11-20 DIAGNOSIS — B351 Tinea unguium: Secondary | ICD-10-CM | POA: Diagnosis not present

## 2016-11-20 DIAGNOSIS — M79675 Pain in left toe(s): Secondary | ICD-10-CM | POA: Diagnosis not present

## 2016-11-20 DIAGNOSIS — M79674 Pain in right toe(s): Secondary | ICD-10-CM | POA: Diagnosis not present

## 2016-11-23 DIAGNOSIS — Z951 Presence of aortocoronary bypass graft: Secondary | ICD-10-CM | POA: Diagnosis not present

## 2016-11-25 DIAGNOSIS — Z951 Presence of aortocoronary bypass graft: Secondary | ICD-10-CM | POA: Diagnosis not present

## 2016-11-26 DIAGNOSIS — Z951 Presence of aortocoronary bypass graft: Secondary | ICD-10-CM | POA: Diagnosis not present

## 2016-11-30 DIAGNOSIS — H6122 Impacted cerumen, left ear: Secondary | ICD-10-CM | POA: Diagnosis not present

## 2016-11-30 DIAGNOSIS — N183 Chronic kidney disease, stage 3 (moderate): Secondary | ICD-10-CM | POA: Diagnosis not present

## 2016-11-30 DIAGNOSIS — Z951 Presence of aortocoronary bypass graft: Secondary | ICD-10-CM | POA: Diagnosis not present

## 2016-11-30 DIAGNOSIS — I129 Hypertensive chronic kidney disease with stage 1 through stage 4 chronic kidney disease, or unspecified chronic kidney disease: Secondary | ICD-10-CM | POA: Diagnosis not present

## 2016-12-02 DIAGNOSIS — Z951 Presence of aortocoronary bypass graft: Secondary | ICD-10-CM | POA: Diagnosis not present

## 2016-12-07 DIAGNOSIS — H6123 Impacted cerumen, bilateral: Secondary | ICD-10-CM | POA: Diagnosis not present

## 2016-12-07 DIAGNOSIS — H9193 Unspecified hearing loss, bilateral: Secondary | ICD-10-CM | POA: Diagnosis not present

## 2016-12-07 DIAGNOSIS — Z951 Presence of aortocoronary bypass graft: Secondary | ICD-10-CM | POA: Diagnosis not present

## 2016-12-07 DIAGNOSIS — Z77122 Contact with and (suspected) exposure to noise: Secondary | ICD-10-CM | POA: Diagnosis not present

## 2016-12-07 DIAGNOSIS — Z87891 Personal history of nicotine dependence: Secondary | ICD-10-CM | POA: Diagnosis not present

## 2016-12-07 DIAGNOSIS — Z23 Encounter for immunization: Secondary | ICD-10-CM | POA: Diagnosis not present

## 2016-12-07 DIAGNOSIS — Z7289 Other problems related to lifestyle: Secondary | ICD-10-CM | POA: Diagnosis not present

## 2016-12-08 ENCOUNTER — Encounter: Payer: Self-pay | Admitting: Cardiology

## 2016-12-14 DIAGNOSIS — Z951 Presence of aortocoronary bypass graft: Secondary | ICD-10-CM | POA: Diagnosis not present

## 2016-12-16 DIAGNOSIS — Z951 Presence of aortocoronary bypass graft: Secondary | ICD-10-CM | POA: Diagnosis not present

## 2016-12-17 DIAGNOSIS — I2510 Atherosclerotic heart disease of native coronary artery without angina pectoris: Secondary | ICD-10-CM | POA: Diagnosis not present

## 2016-12-17 DIAGNOSIS — Z951 Presence of aortocoronary bypass graft: Secondary | ICD-10-CM | POA: Diagnosis not present

## 2016-12-17 DIAGNOSIS — Z87891 Personal history of nicotine dependence: Secondary | ICD-10-CM | POA: Diagnosis not present

## 2016-12-21 DIAGNOSIS — I2510 Atherosclerotic heart disease of native coronary artery without angina pectoris: Secondary | ICD-10-CM | POA: Diagnosis not present

## 2016-12-21 DIAGNOSIS — Z87891 Personal history of nicotine dependence: Secondary | ICD-10-CM | POA: Diagnosis not present

## 2016-12-21 DIAGNOSIS — Z951 Presence of aortocoronary bypass graft: Secondary | ICD-10-CM | POA: Diagnosis not present

## 2016-12-22 ENCOUNTER — Ambulatory Visit: Payer: Medicare Other | Admitting: Cardiology

## 2016-12-23 NOTE — Progress Notes (Signed)
Cardiology Office Note   Date:  12/24/2016   ID:  Marc Gilbert, DOB 07-29-1930, MRN [id number]  PCP:  Lajean Manes, MD  Cardiologist:   Minus Breeding, MD    Chief Complaint  Patient presents with  . Coronary Artery Disease      History of Present Illness: Marc Gilbert is a 83 y.o. male who presents for follow up of CAD/CABG.    He had CABG 08/14/2016 w/ LIMA-LAD and SVG-OM.  He has done very well.  He has completed cardiac rehab.  He is getting a Tesla.  The patient denies any new symptoms such as chest discomfort, neck or arm discomfort. There has been no new shortness of breath, PND or orthopnea. There have been no reported palpitations, presyncope or syncope.      Past Medical History:  Diagnosis Date  . Arthritis    "right hand; back" (08/30/2015)  . Cancer (HCC)    skin  squamous and basal  . Chronic lower back pain   . CKD (chronic kidney disease), stage III ([place])   . COPD (chronic obstructive pulmonary disease) (HCC)    no home O2  . Dysplastic colon polyp age 62   carcinoma in situ  . GERD (gastroesophageal reflux disease)    occ  . H/O hiatal hernia   . Hyperlipidemia   . Hypertension   . Prostate cancer ([place]) 07/2014   active surveillance, Glisson 6    Past Surgical History:  Procedure Laterality Date  . CATARACT EXTRACTION W/ INTRAOCULAR LENS IMPLANT Left 08/27/15  . JOINT REPLACEMENT    . MOHS SURGERY     multiple SCC  . PROSTATE BIOPSY  07/2014  . TONSILLECTOMY       Current Outpatient Medications  Medication Sig Dispense Refill  . acetaminophen (TYLENOL) 500 MG tablet Take 500-1,000 mg by mouth every 6 (six) hours as needed for mild pain or moderate pain.    Marland Kitchen aspirin EC 325 MG EC tablet Take 1 tablet (325 mg total) by mouth daily. 30 tablet 0  . atorvastatin (LIPITOR) 40 MG tablet Take 1 tablet (40 mg total) by mouth daily. 90 tablet 3  . metoprolol succinate (TOPROL XL) 50 MG 24 hr tablet Take 1 tablet (50 mg total) by mouth 2 (two)  times daily. Take with or immediately following a meal. 180 tablet 3  . Multiple Vitamins-Minerals (ONE-A-DAY MENS 50+ ADVANTAGE) TABS Take 1 tablet by mouth daily with breakfast.    . Omega-3 Fatty Acids (FISH OIL) 600 MG CAPS Take 1,800 mg by mouth every morning.     . psyllium (METAMUCIL) 58.6 % packet Take 1 packet by mouth 2 (two) times daily.     . Tamsulosin HCl (FLOMAX) 0.4 MG CAPS Take 0.8 mg by mouth daily.      No current facility-administered medications for this visit.     Allergies:   No known allergies    ROS:  Please see the history of present illness.   Otherwise, review of systems are positive for none.   All other systems are reviewed and negative.    PHYSICAL EXAM: VS:  BP 120/64   Pulse 80   Ht 5' 11.5" (1.816 m)   Wt 201 lb 9.6 oz (91.4 kg)   SpO2 94%   BMI 27.73 kg/m  , BMI Body mass index is 27.73 kg/m. GENERAL:  Well appearing NECK:  No jugular venous distention, waveform within normal limits, carotid upstroke brisk and symmetric, no bruits, no  thyromegaly LUNGS:  Left basilar crackles CHEST:  Well healed sternotomy scar. HEART:  PMI not displaced or sustained,S1 and S2 within normal limits, no S3, no S4, no clicks, no rubs, no murmurs ABD:  Flat, positive bowel sounds normal in frequency in pitch, no bruits, no rebound, no guarding, no midline pulsatile mass, no hepatomegaly, no splenomegaly EXT:  2 plus pulses throughout, no edema, no cyanosis no clubbing    EKG:  EKG is not ordered today.    Recent Labs: 08/15/2016: Magnesium 2.1 08/16/2016: BUN 14; Creatinine, Ser 1.24; Hemoglobin 11.3; Platelets 137; Potassium 3.4; Sodium 131    Lipid Panel    Component Value Date/Time   CHOL 157 08/10/2016 1302   TRIG 72 08/10/2016 1302   HDL 56 08/10/2016 1302   CHOLHDL 2.8 08/10/2016 1302   VLDL 14 08/10/2016 1302   LDLCALC 87 08/10/2016 1302      Wt Readings from Last 3 Encounters:  12/24/16 201 lb 9.6 oz (91.4 kg)  09/21/16 196 lb (88.9 kg)    09/09/16 195 lb 9.6 oz (88.7 kg)      Other studies Reviewed: Additional studies/ records that were reviewed today include: None. Review of the above records demonstrates:     ASSESSMENT AND PLAN:   CAD:  The patient has no new sypmtoms.  No further cardiovascular testing is indicated.  We will continue with aggressive risk reduction and meds as listed.  HTN:  The blood pressure is at target. No change in medications is indicated. We will continue with therapeutic lifestyle changes (TLC).  DYSLIPIDEMIA:  Lipids are at target.  No change in therapy.    CKD:  Creat is mildly elevated and stable.     Current medicines are reviewed at length with the patient today.  The patient does not have concerns regarding medicines.  The following changes have been made:  no change  Labs/ tests ordered today include: None No orders of the defined types were placed in this encounter.    Disposition:   FU with me in six months.     Signed, Minus Breeding, MD  12/24/2016 2:31 PM    [facility]

## 2016-12-24 ENCOUNTER — Ambulatory Visit (INDEPENDENT_AMBULATORY_CARE_PROVIDER_SITE_OTHER): Payer: Medicare Other | Admitting: Cardiology

## 2016-12-24 ENCOUNTER — Encounter: Payer: Self-pay | Admitting: Cardiology

## 2016-12-24 VITALS — BP 120/64 | HR 80 | Ht 71.5 in | Wt 201.6 lb

## 2016-12-24 DIAGNOSIS — E785 Hyperlipidemia, unspecified: Secondary | ICD-10-CM

## 2016-12-24 DIAGNOSIS — I2510 Atherosclerotic heart disease of native coronary artery without angina pectoris: Secondary | ICD-10-CM

## 2016-12-24 DIAGNOSIS — I10 Essential (primary) hypertension: Secondary | ICD-10-CM

## 2016-12-24 NOTE — Patient Instructions (Signed)

## 2017-01-22 DIAGNOSIS — M79674 Pain in right toe(s): Secondary | ICD-10-CM | POA: Diagnosis not present

## 2017-01-22 DIAGNOSIS — M79675 Pain in left toe(s): Secondary | ICD-10-CM | POA: Diagnosis not present

## 2017-01-22 DIAGNOSIS — B351 Tinea unguium: Secondary | ICD-10-CM | POA: Diagnosis not present

## 2017-02-26 DIAGNOSIS — D485 Neoplasm of uncertain behavior of skin: Secondary | ICD-10-CM | POA: Diagnosis not present

## 2017-02-26 DIAGNOSIS — C44629 Squamous cell carcinoma of skin of left upper limb, including shoulder: Secondary | ICD-10-CM | POA: Diagnosis not present

## 2017-03-26 DIAGNOSIS — B351 Tinea unguium: Secondary | ICD-10-CM | POA: Diagnosis not present

## 2017-03-26 DIAGNOSIS — M79675 Pain in left toe(s): Secondary | ICD-10-CM | POA: Diagnosis not present

## 2017-03-26 DIAGNOSIS — M79674 Pain in right toe(s): Secondary | ICD-10-CM | POA: Diagnosis not present

## 2017-04-13 DIAGNOSIS — L988 Other specified disorders of the skin and subcutaneous tissue: Secondary | ICD-10-CM | POA: Diagnosis not present

## 2017-04-13 DIAGNOSIS — C44629 Squamous cell carcinoma of skin of left upper limb, including shoulder: Secondary | ICD-10-CM | POA: Diagnosis not present

## 2017-04-13 DIAGNOSIS — D485 Neoplasm of uncertain behavior of skin: Secondary | ICD-10-CM | POA: Diagnosis not present

## 2017-04-13 DIAGNOSIS — C44722 Squamous cell carcinoma of skin of right lower limb, including hip: Secondary | ICD-10-CM | POA: Diagnosis not present

## 2017-04-13 DIAGNOSIS — D229 Melanocytic nevi, unspecified: Secondary | ICD-10-CM | POA: Diagnosis not present

## 2017-04-14 DIAGNOSIS — R351 Nocturia: Secondary | ICD-10-CM | POA: Diagnosis not present

## 2017-04-14 DIAGNOSIS — C61 Malignant neoplasm of prostate: Secondary | ICD-10-CM | POA: Diagnosis not present

## 2017-04-14 DIAGNOSIS — N401 Enlarged prostate with lower urinary tract symptoms: Secondary | ICD-10-CM | POA: Diagnosis not present

## 2017-06-11 DIAGNOSIS — M79675 Pain in left toe(s): Secondary | ICD-10-CM | POA: Diagnosis not present

## 2017-06-11 DIAGNOSIS — B351 Tinea unguium: Secondary | ICD-10-CM | POA: Diagnosis not present

## 2017-06-11 DIAGNOSIS — M79674 Pain in right toe(s): Secondary | ICD-10-CM | POA: Diagnosis not present

## 2017-06-14 ENCOUNTER — Other Ambulatory Visit: Payer: Self-pay | Admitting: Geriatric Medicine

## 2017-06-14 ENCOUNTER — Ambulatory Visit
Admission: RE | Admit: 2017-06-14 | Discharge: 2017-06-14 | Disposition: A | Discharge status: Home / Self Care | Payer: Medicare Other | Source: Ambulatory Visit | Attending: Geriatric Medicine | Admitting: Geriatric Medicine

## 2017-06-14 DIAGNOSIS — Z0000 Encounter for general adult medical examination without abnormal findings: Secondary | ICD-10-CM | POA: Diagnosis not present

## 2017-06-14 DIAGNOSIS — R059 Cough, unspecified: Secondary | ICD-10-CM

## 2017-06-14 DIAGNOSIS — E7800 Pure hypercholesterolemia, unspecified: Secondary | ICD-10-CM | POA: Diagnosis not present

## 2017-06-14 DIAGNOSIS — Z79899 Other long term (current) drug therapy: Secondary | ICD-10-CM | POA: Diagnosis not present

## 2017-06-14 DIAGNOSIS — R05 Cough: Secondary | ICD-10-CM | POA: Diagnosis not present

## 2017-06-14 DIAGNOSIS — J449 Chronic obstructive pulmonary disease, unspecified: Secondary | ICD-10-CM | POA: Diagnosis not present

## 2017-06-14 DIAGNOSIS — N183 Chronic kidney disease, stage 3 (moderate): Secondary | ICD-10-CM | POA: Diagnosis not present

## 2017-06-14 DIAGNOSIS — I700 Atherosclerosis of aorta: Secondary | ICD-10-CM | POA: Diagnosis not present

## 2017-06-14 DIAGNOSIS — I129 Hypertensive chronic kidney disease with stage 1 through stage 4 chronic kidney disease, or unspecified chronic kidney disease: Secondary | ICD-10-CM | POA: Diagnosis not present

## 2017-06-14 DIAGNOSIS — Z1389 Encounter for screening for other disorder: Secondary | ICD-10-CM | POA: Diagnosis not present

## 2017-06-15 DIAGNOSIS — C44729 Squamous cell carcinoma of skin of left lower limb, including hip: Secondary | ICD-10-CM | POA: Diagnosis not present

## 2017-06-15 DIAGNOSIS — D0471 Carcinoma in situ of skin of right lower limb, including hip: Secondary | ICD-10-CM | POA: Diagnosis not present

## 2017-06-25 ENCOUNTER — Encounter: Payer: Self-pay | Admitting: Physician Assistant

## 2017-07-21 ENCOUNTER — Other Ambulatory Visit: Payer: Self-pay

## 2017-07-21 ENCOUNTER — Ambulatory Visit (INDEPENDENT_AMBULATORY_CARE_PROVIDER_SITE_OTHER): Payer: Medicare Other | Admitting: Surgery

## 2017-07-21 ENCOUNTER — Encounter: Payer: Self-pay | Admitting: Surgery

## 2017-07-21 VITALS — BP 123/66 | HR 82 | Resp 16 | Ht 71.5 in | Wt 206.0 lb

## 2017-07-21 DIAGNOSIS — I2510 Atherosclerotic heart disease of native coronary artery without angina pectoris: Secondary | ICD-10-CM

## 2017-07-21 DIAGNOSIS — K432 Incisional hernia without obstruction or gangrene: Secondary | ICD-10-CM | POA: Diagnosis not present

## 2017-07-21 DIAGNOSIS — Z951 Presence of aortocoronary bypass graft: Secondary | ICD-10-CM

## 2017-07-21 NOTE — Progress Notes (Signed)
     HPI:  The patient returns today for examination of a possible incisional hernia status post coronary bypass graft surgery x2 on 08/19/2016.  The patient is here today with his daughter who is an OB/GYN.  He is not sure when the protrusion in the epigastrium started.  He has had no pain from it.  He has been eating normally.  He has remained physically active without chest pain or shortness of breath.  Current Outpatient Medications  Medication Sig Dispense Refill  . acetaminophen (TYLENOL) 500 MG tablet Take 500-1,000 mg by mouth every 6 (six) hours as needed for mild pain or moderate pain.    Marland Kitchen aspirin EC 325 MG EC tablet Take 1 tablet (325 mg total) by mouth daily. 30 tablet 0  . atorvastatin (LIPITOR) 40 MG tablet Take 1 tablet (40 mg total) by mouth daily. 90 tablet 3  . metoprolol succinate (TOPROL XL) 50 MG 24 hr tablet Take 1 tablet (50 mg total) by mouth 2 (two) times daily. Take with or immediately following a meal. 180 tablet 3  . Multiple Vitamins-Minerals (ONE-A-DAY MENS 50+ ADVANTAGE) TABS Take 1 tablet by mouth daily with breakfast.    . Omega-3 Fatty Acids (FISH OIL) 600 MG CAPS Take 1,800 mg by mouth every morning.     . psyllium (METAMUCIL) 58.6 % packet Take 1 packet by mouth 2 (two) times daily.     . Tamsulosin HCl (FLOMAX) 0.4 MG CAPS Take 0.8 mg by mouth daily.      No current facility-administered medications for this visit.      Physical Exam: BP 123/66 (BP Location: Right Arm, Patient Position: Sitting, Cuff Size: Large)   Pulse 82   Resp 16   Ht 5' 11.5" (1.816 m)   Wt 206 lb (93.4 kg)   SpO2 94% Comment: ON RA  BMI 28.33 kg/m  He looks well. The chest incision is well-healed and sternum is stable. There is a small epigastric ventral incisional hernia beneath the lower end of the sternotomy incision.  The fascial defect is not well-defined and it may be that this area is just weakened and not an actual separation of the fascia.  It does protrude more when  the patient bears down.  It does not appear tender and is easily reducible.  Diagnostic Tests:  None today  Impression:  He likely has a small epigastric incisional hernia at the lower end of the sternotomy incision.  The fascial defect is not clearly defined but if it is a hernia is easily reducible.  It does not appear to be causing any symptoms and given his age of almost 84 years old I think would be best to continue following this.  I would only recommend surgical repair if it is causing him significant discomfort or becoming incarcerated.  Surgical repair would require using a synthetic mesh which I would like to avoid if possible.  Plan:  He will continue to follow this and if it enlarges or starts causing him significant discomfort then he will return to see me for further evaluation.  I spent 10minutes performing this established patient evaluation and > 50% of this time was spent face to face counseling and coordinating the care of this patient's incisional epigastric hernia.    Gaye Pollack, MD [facility] [phone]

## 2017-07-27 DIAGNOSIS — L570 Actinic keratosis: Secondary | ICD-10-CM | POA: Diagnosis not present

## 2017-07-27 DIAGNOSIS — L723 Sebaceous cyst: Secondary | ICD-10-CM | POA: Diagnosis not present

## 2017-07-27 DIAGNOSIS — Z85828 Personal history of other malignant neoplasm of skin: Secondary | ICD-10-CM | POA: Diagnosis not present

## 2017-07-27 DIAGNOSIS — L821 Other seborrheic keratosis: Secondary | ICD-10-CM | POA: Diagnosis not present

## 2017-07-27 DIAGNOSIS — D485 Neoplasm of uncertain behavior of skin: Secondary | ICD-10-CM | POA: Diagnosis not present

## 2017-07-27 DIAGNOSIS — C44319 Basal cell carcinoma of skin of other parts of face: Secondary | ICD-10-CM | POA: Diagnosis not present

## 2017-07-27 DIAGNOSIS — Z86018 Personal history of other benign neoplasm: Secondary | ICD-10-CM | POA: Diagnosis not present

## 2017-08-13 DIAGNOSIS — M79674 Pain in right toe(s): Secondary | ICD-10-CM | POA: Diagnosis not present

## 2017-08-13 DIAGNOSIS — B351 Tinea unguium: Secondary | ICD-10-CM | POA: Diagnosis not present

## 2017-08-13 DIAGNOSIS — M79675 Pain in left toe(s): Secondary | ICD-10-CM | POA: Diagnosis not present

## 2017-09-08 ENCOUNTER — Telehealth (INDEPENDENT_AMBULATORY_CARE_PROVIDER_SITE_OTHER): Payer: Self-pay | Admitting: Orthopaedic Surgery

## 2017-09-08 NOTE — Telephone Encounter (Signed)
Patient called requesting a prescription refill of Cleocin 300 mg to be called into [facility] on [address] in [place].  Patient states he is having extensive dental work (3 crowns and a root canal) performed next month and ran out of his antibiotic.  Patient states he had hip replacement surgery with Dr. Durward Fortes approximately 6 years ago.

## 2017-09-08 NOTE — Telephone Encounter (Signed)
PLEASE ADVISE.

## 2017-09-08 NOTE — Telephone Encounter (Signed)
Ok to fill cleocin 300mg . Check protocol for number to be taken prior to procedure

## 2017-09-09 ENCOUNTER — Other Ambulatory Visit (INDEPENDENT_AMBULATORY_CARE_PROVIDER_SITE_OTHER): Payer: Self-pay | Admitting: Radiology

## 2017-09-09 ENCOUNTER — Other Ambulatory Visit (INDEPENDENT_AMBULATORY_CARE_PROVIDER_SITE_OTHER): Payer: Self-pay | Admitting: *Deleted

## 2017-09-09 MED ORDER — CLINDAMYCIN HCL 150 MG PO CAPS: ORAL_CAPSULE | ORAL | 0 refills | Status: DC

## 2017-09-09 NOTE — Telephone Encounter (Signed)
SENT MEDS INTO PHARMACY

## 2017-10-13 ENCOUNTER — Other Ambulatory Visit: Payer: Self-pay | Admitting: Physician Assistant

## 2017-10-13 DIAGNOSIS — C44319 Basal cell carcinoma of skin of other parts of face: Secondary | ICD-10-CM | POA: Diagnosis not present

## 2017-10-13 DIAGNOSIS — C44310 Basal cell carcinoma of skin of unspecified parts of face: Secondary | ICD-10-CM | POA: Diagnosis not present

## 2017-10-13 NOTE — Telephone Encounter (Signed)
This is Dr. Hochrein's pt. °

## 2017-10-15 DIAGNOSIS — B351 Tinea unguium: Secondary | ICD-10-CM | POA: Diagnosis not present

## 2017-10-15 DIAGNOSIS — M79675 Pain in left toe(s): Secondary | ICD-10-CM | POA: Diagnosis not present

## 2017-10-15 DIAGNOSIS — M79674 Pain in right toe(s): Secondary | ICD-10-CM | POA: Diagnosis not present

## 2017-11-04 DIAGNOSIS — H5203 Hypermetropia, bilateral: Secondary | ICD-10-CM | POA: Diagnosis not present

## 2017-11-04 DIAGNOSIS — Z961 Presence of intraocular lens: Secondary | ICD-10-CM | POA: Diagnosis not present

## 2017-11-04 DIAGNOSIS — H26493 Other secondary cataract, bilateral: Secondary | ICD-10-CM | POA: Diagnosis not present

## 2017-12-10 DIAGNOSIS — I889 Nonspecific lymphadenitis, unspecified: Secondary | ICD-10-CM | POA: Diagnosis not present

## 2017-12-15 DIAGNOSIS — R599 Enlarged lymph nodes, unspecified: Secondary | ICD-10-CM | POA: Diagnosis not present

## 2017-12-15 DIAGNOSIS — N183 Chronic kidney disease, stage 3 (moderate): Secondary | ICD-10-CM | POA: Diagnosis not present

## 2017-12-15 DIAGNOSIS — I129 Hypertensive chronic kidney disease with stage 1 through stage 4 chronic kidney disease, or unspecified chronic kidney disease: Secondary | ICD-10-CM | POA: Diagnosis not present

## 2017-12-15 DIAGNOSIS — M10071 Idiopathic gout, right ankle and foot: Secondary | ICD-10-CM | POA: Diagnosis not present

## 2017-12-17 DIAGNOSIS — M79674 Pain in right toe(s): Secondary | ICD-10-CM | POA: Diagnosis not present

## 2017-12-17 DIAGNOSIS — B351 Tinea unguium: Secondary | ICD-10-CM | POA: Diagnosis not present

## 2017-12-17 DIAGNOSIS — M79675 Pain in left toe(s): Secondary | ICD-10-CM | POA: Diagnosis not present

## 2017-12-20 ENCOUNTER — Other Ambulatory Visit: Payer: Self-pay | Admitting: Cardiology

## 2017-12-21 NOTE — Progress Notes (Signed)
Cardiology Office Note   Date:  12/23/2017   ID:  Marc Gilbert, DOB 1930/07/01, MRN [id number]  PCP:  Lajean Manes, MD  Cardiologist:   Minus Breeding, MD    Chief Complaint  Patient presents with  . Coronary Artery Disease      History of Present Illness: Marc Gilbert is a 84 y.o. male who presents for follow up of CAD/CABG.    He had CABG 08/14/2016 w/ LIMA-LAD and SVG-OM.   Since I last saw him he has done very well.  He spent a month in [place].  He does daily exercising.The patient denies any new symptoms such as chest discomfort, neck or arm discomfort. There has been no new shortness of breath, PND or orthopnea. There have been no reported palpitations, presyncope or syncope.   Past Medical History:  Diagnosis Date  . Arthritis    "right hand; back" (08/30/2015)  . Cancer (HCC)    skin  squamous and basal  . Chronic lower back pain   . CKD (chronic kidney disease), stage III ([place])   . COPD (chronic obstructive pulmonary disease) (HCC)    no home O2  . Dysplastic colon polyp age 89   carcinoma in situ  . GERD (gastroesophageal reflux disease)    occ  . H/O hiatal hernia   . Hyperlipidemia   . Hypertension   . Prostate cancer ([place]) 07/2014   active surveillance, Glisson 6    Past Surgical History:  Procedure Laterality Date  . CATARACT EXTRACTION W/ INTRAOCULAR LENS IMPLANT Left 08/27/15  . CORONARY ARTERY BYPASS GRAFT N/A 08/14/2016   Procedure: CORONARY ARTERY BYPASS GRAFTING (CABG)x2, using left internal mammary artery and right greater saphenous vein harvested endoscopically;  Surgeon: Gaye Pollack, MD;  Location: [facility];  Service: Open Heart Surgery;  Laterality: N/A;  . JOINT REPLACEMENT    . LEFT HEART CATH AND CORONARY ANGIOGRAPHY N/A 08/11/2016   Procedure: Left Heart Cath and Coronary Angiography;  Surgeon: Belva Crome, MD;  Location: [facility];  Service: Cardiovascular;  Laterality: N/A;  . MOHS SURGERY     multiple SCC  . PROSTATE  BIOPSY  07/2014  . TEE WITHOUT CARDIOVERSION N/A 08/14/2016   Procedure: TRANSESOPHAGEAL ECHOCARDIOGRAM (TEE);  Surgeon: Gaye Pollack, MD;  Location: [facility];  Service: Open Heart Surgery;  Laterality: N/A;  . TONSILLECTOMY    . TOTAL HIP ARTHROPLASTY  05/26/2011   Procedure: TOTAL HIP ARTHROPLASTY;  Surgeon: Garald Balding, MD;  Location: [facility];  Service: Orthopedics;  Laterality: Right;  . ULTRASOUND GUIDANCE FOR VASCULAR ACCESS  08/11/2016   Procedure: Ultrasound Guidance For Vascular Access;  Surgeon: Belva Crome, MD;  Location: [facility];  Service: Cardiovascular;;     Current Outpatient Medications  Medication Sig Dispense Refill  . acetaminophen (TYLENOL) 500 MG tablet Take 500-1,000 mg by mouth every 6 (six) hours as needed for mild pain or moderate pain.    Marland Kitchen AMOXICILLIN PO Take 4 tablets by mouth as needed.    Marland Kitchen aspirin 81 MG tablet Take 81 mg by mouth daily.    Marland Kitchen atorvastatin (LIPITOR) 40 MG tablet Take 1 tablet (40 mg total) by mouth daily. 90 tablet 3  . metoprolol succinate (TOPROL-XL) 50 MG 24 hr tablet Take 1 tablet (50 mg total) by mouth 2 (two) times daily. Take with or immediately following a meal. 180 tablet 3  . Multiple Vitamins-Minerals (ONE-A-DAY MENS 50+ ADVANTAGE) TABS Take 1 tablet  by mouth daily with breakfast.    . Omega-3 Fatty Acids (FISH OIL OMEGA-3 PO) Take 1,400 Units by mouth daily.    . psyllium (METAMUCIL) 58.6 % packet Take 1 packet by mouth 2 (two) times daily.     . Tamsulosin HCl (FLOMAX) 0.4 MG CAPS Take 0.8 mg by mouth daily.      No current facility-administered medications for this visit.     Allergies:   No known allergies    ROS:  Please see the history of present illness.   Otherwise, review of systems are positive for none.   All other systems are reviewed and negative.    PHYSICAL EXAM: VS:  BP 132/70 (BP Location: Left Arm, Patient Position: Sitting, Cuff Size: Normal)   Pulse 71   Ht 6' (1.829 m)   Wt 207 lb (93.9 kg)    BMI 28.07 kg/m  , BMI Body mass index is 28.07 kg/m. GENERAL:  Well appearing NECK:  No jugular venous distention, waveform within normal limits, carotid upstroke brisk and symmetric, no bruits, no thyromegaly LUNGS:  Clear to auscultation bilaterally CHEST:  Well healed sternotomy scar.  There is a slight substernal ventral hernia HEART:  PMI not displaced or sustained,S1 and S2 within normal limits, no S3, no S4, no clicks, no rubs, no murmurs ABD:  Flat, positive bowel sounds normal in frequency in pitch, no bruits, no rebound, no guarding, no midline pulsatile mass, no hepatomegaly, no splenomegaly EXT:  2 plus pulses throughout, no edema, no cyanosis no clubbing   EKG:  EKG is  ordered today. Sinus rhythm, rate 71, RSR prime V1 and V2, nonspecific T wave changes.  No change from previous.   Recent Labs: No results found for requested labs within last 8760 hours.    Lipid Panel    Component Value Date/Time   CHOL 157 08/10/2016 1302   TRIG 72 08/10/2016 1302   HDL 56 08/10/2016 1302   CHOLHDL 2.8 08/10/2016 1302   VLDL 14 08/10/2016 1302   LDLCALC 87 08/10/2016 1302      Wt Readings from Last 3 Encounters:  12/23/17 207 lb (93.9 kg)  07/21/17 206 lb (93.4 kg)  12/24/16 201 lb 9.6 oz (91.4 kg)      Other studies Reviewed: Additional studies/ records that were reviewed today include: Labs. Review of the above records demonstrates:     ASSESSMENT AND PLAN:   CAD:  The patient has no new sypmtoms.  No further cardiovascular testing is indicated.  We will continue with aggressive risk reduction and meds as listed.  HTN:  The blood pressure is at target. No change in medications is indicated. We will continue with therapeutic lifestyle changes (TLC).  DYSLIPIDEMIA:   LDL is 61, HDL 53.  He will continue the meds as listed.    Current medicines are reviewed at length with the patient today.  The patient does not have concerns regarding medicines.  The following  changes have been made: None  Labs/ tests ordered today include: None No orders of the defined types were placed in this encounter.    Disposition:   FU with me in 12 months.     Signed, Minus Breeding, MD  12/23/2017 11:26 AM    [facility]

## 2017-12-22 ENCOUNTER — Other Ambulatory Visit: Payer: Self-pay | Admitting: Cardiology

## 2017-12-23 ENCOUNTER — Ambulatory Visit (INDEPENDENT_AMBULATORY_CARE_PROVIDER_SITE_OTHER): Payer: Medicare Other | Admitting: Cardiology

## 2017-12-23 ENCOUNTER — Encounter: Payer: Self-pay | Admitting: Cardiology

## 2017-12-23 VITALS — BP 132/70 | HR 71 | Ht 72.0 in | Wt 207.0 lb

## 2017-12-23 DIAGNOSIS — I2510 Atherosclerotic heart disease of native coronary artery without angina pectoris: Secondary | ICD-10-CM

## 2017-12-23 MED ORDER — ATORVASTATIN CALCIUM 40 MG PO TABS: 40.0000 mg | ORAL_TABLET | Freq: Every day | ORAL | 3 refills | Status: DC

## 2017-12-23 MED ORDER — METOPROLOL SUCCINATE ER 50 MG PO TB24: 50.0000 mg | ORAL_TABLET | Freq: Two times a day (BID) | ORAL | 3 refills | Status: DC

## 2017-12-23 NOTE — Patient Instructions (Signed)

## 2017-12-24 NOTE — Telephone Encounter (Signed)
Rx request sent to pharmacy.  

## 2017-12-24 NOTE — Addendum Note (Signed)
Addended by: Venetia Maxon on: 12/24/2017 11:22 AM   Modules accepted: Orders

## 2018-02-15 DIAGNOSIS — H26493 Other secondary cataract, bilateral: Secondary | ICD-10-CM | POA: Diagnosis not present

## 2018-02-15 DIAGNOSIS — Z961 Presence of intraocular lens: Secondary | ICD-10-CM | POA: Diagnosis not present

## 2018-02-25 DIAGNOSIS — M79675 Pain in left toe(s): Secondary | ICD-10-CM | POA: Diagnosis not present

## 2018-02-25 DIAGNOSIS — B351 Tinea unguium: Secondary | ICD-10-CM | POA: Diagnosis not present

## 2018-02-25 DIAGNOSIS — M79674 Pain in right toe(s): Secondary | ICD-10-CM | POA: Diagnosis not present

## 2018-03-01 DIAGNOSIS — H26491 Other secondary cataract, right eye: Secondary | ICD-10-CM | POA: Diagnosis not present

## 2018-03-15 DIAGNOSIS — H26492 Other secondary cataract, left eye: Secondary | ICD-10-CM | POA: Diagnosis not present

## 2018-03-17 DIAGNOSIS — L570 Actinic keratosis: Secondary | ICD-10-CM | POA: Diagnosis not present

## 2018-03-17 DIAGNOSIS — C44329 Squamous cell carcinoma of skin of other parts of face: Secondary | ICD-10-CM | POA: Diagnosis not present

## 2018-03-17 DIAGNOSIS — Z23 Encounter for immunization: Secondary | ICD-10-CM | POA: Diagnosis not present

## 2018-03-17 DIAGNOSIS — D485 Neoplasm of uncertain behavior of skin: Secondary | ICD-10-CM | POA: Diagnosis not present

## 2018-03-18 MED ORDER — METOPROLOL SUCCINATE ER 50 MG PO TB24: ORAL_TABLET | ORAL | 3 refills | Status: DC

## 2018-03-18 MED ORDER — ATORVASTATIN CALCIUM 40 MG PO TABS: 40.0000 mg | ORAL_TABLET | Freq: Every day | ORAL | 3 refills | Status: DC

## 2018-03-18 NOTE — Addendum Note (Signed)
Addended by: Fidel Levy on: 03/18/2018 02:09 PM   Modules accepted: Orders

## 2018-04-08 ENCOUNTER — Ambulatory Visit
Admission: RE | Admit: 2018-04-08 | Discharge: 2018-04-08 | Disposition: A | Discharge status: Home / Self Care | Payer: Medicare Other | Source: Ambulatory Visit | Attending: Geriatric Medicine | Admitting: Geriatric Medicine

## 2018-04-08 ENCOUNTER — Other Ambulatory Visit: Payer: Self-pay | Admitting: Geriatric Medicine

## 2018-04-08 DIAGNOSIS — Z77090 Contact with and (suspected) exposure to asbestos: Secondary | ICD-10-CM

## 2018-04-08 DIAGNOSIS — R0989 Other specified symptoms and signs involving the circulatory and respiratory systems: Secondary | ICD-10-CM | POA: Diagnosis not present

## 2018-04-08 DIAGNOSIS — R05 Cough: Secondary | ICD-10-CM | POA: Diagnosis not present

## 2018-04-08 DIAGNOSIS — J8410 Pulmonary fibrosis, unspecified: Secondary | ICD-10-CM | POA: Diagnosis not present

## 2018-04-12 DIAGNOSIS — C44329 Squamous cell carcinoma of skin of other parts of face: Secondary | ICD-10-CM | POA: Diagnosis not present

## 2018-04-29 DIAGNOSIS — M79674 Pain in right toe(s): Secondary | ICD-10-CM | POA: Diagnosis not present

## 2018-04-29 DIAGNOSIS — M79675 Pain in left toe(s): Secondary | ICD-10-CM | POA: Diagnosis not present

## 2018-04-29 DIAGNOSIS — B351 Tinea unguium: Secondary | ICD-10-CM | POA: Diagnosis not present

## 2018-05-18 DIAGNOSIS — C61 Malignant neoplasm of prostate: Secondary | ICD-10-CM | POA: Diagnosis not present

## 2018-05-18 DIAGNOSIS — R351 Nocturia: Secondary | ICD-10-CM | POA: Diagnosis not present

## 2018-05-18 DIAGNOSIS — N401 Enlarged prostate with lower urinary tract symptoms: Secondary | ICD-10-CM | POA: Diagnosis not present

## 2018-06-20 DIAGNOSIS — N183 Chronic kidney disease, stage 3 (moderate): Secondary | ICD-10-CM | POA: Diagnosis not present

## 2018-06-20 DIAGNOSIS — I700 Atherosclerosis of aorta: Secondary | ICD-10-CM | POA: Diagnosis not present

## 2018-06-20 DIAGNOSIS — E7800 Pure hypercholesterolemia, unspecified: Secondary | ICD-10-CM | POA: Diagnosis not present

## 2018-06-20 DIAGNOSIS — I129 Hypertensive chronic kidney disease with stage 1 through stage 4 chronic kidney disease, or unspecified chronic kidney disease: Secondary | ICD-10-CM | POA: Diagnosis not present

## 2018-06-20 DIAGNOSIS — R202 Paresthesia of skin: Secondary | ICD-10-CM | POA: Diagnosis not present

## 2018-06-20 DIAGNOSIS — Z1389 Encounter for screening for other disorder: Secondary | ICD-10-CM | POA: Diagnosis not present

## 2018-06-20 DIAGNOSIS — J449 Chronic obstructive pulmonary disease, unspecified: Secondary | ICD-10-CM | POA: Diagnosis not present

## 2018-06-20 DIAGNOSIS — Z0000 Encounter for general adult medical examination without abnormal findings: Secondary | ICD-10-CM | POA: Diagnosis not present

## 2018-07-12 DIAGNOSIS — Z85828 Personal history of other malignant neoplasm of skin: Secondary | ICD-10-CM | POA: Diagnosis not present

## 2018-07-12 DIAGNOSIS — L821 Other seborrheic keratosis: Secondary | ICD-10-CM | POA: Diagnosis not present

## 2018-07-12 DIAGNOSIS — L570 Actinic keratosis: Secondary | ICD-10-CM | POA: Diagnosis not present

## 2018-07-14 DIAGNOSIS — C61 Malignant neoplasm of prostate: Secondary | ICD-10-CM | POA: Diagnosis not present

## 2018-08-01 DIAGNOSIS — B351 Tinea unguium: Secondary | ICD-10-CM | POA: Diagnosis not present

## 2018-08-01 DIAGNOSIS — M79674 Pain in right toe(s): Secondary | ICD-10-CM | POA: Diagnosis not present

## 2018-08-01 DIAGNOSIS — M79675 Pain in left toe(s): Secondary | ICD-10-CM | POA: Diagnosis not present

## 2018-08-04 DIAGNOSIS — C44729 Squamous cell carcinoma of skin of left lower limb, including hip: Secondary | ICD-10-CM | POA: Diagnosis not present

## 2018-08-04 DIAGNOSIS — D485 Neoplasm of uncertain behavior of skin: Secondary | ICD-10-CM | POA: Diagnosis not present

## 2018-08-08 ENCOUNTER — Other Ambulatory Visit (HOSPITAL_COMMUNITY): Payer: Self-pay | Admitting: Urology

## 2018-08-08 DIAGNOSIS — C61 Malignant neoplasm of prostate: Secondary | ICD-10-CM

## 2018-08-16 ENCOUNTER — Ambulatory Visit (HOSPITAL_COMMUNITY)
Admission: RE | Admit: 2018-08-16 | Discharge: 2018-08-16 | Disposition: A | Discharge status: Home / Self Care | Payer: Medicare Other | Source: Ambulatory Visit | Attending: Urology | Admitting: Urology

## 2018-08-16 ENCOUNTER — Other Ambulatory Visit: Payer: Self-pay

## 2018-08-16 DIAGNOSIS — C61 Malignant neoplasm of prostate: Secondary | ICD-10-CM | POA: Insufficient documentation

## 2018-08-16 LAB — POCT I-STAT CREATININE: Creatinine, Ser: 1.2 mg/dL (ref 0.61–1.24)

## 2018-08-16 MED ORDER — GADOBUTROL 1 MMOL/ML IV SOLN
10.0000 mL | Freq: Once | INTRAVENOUS | Status: AC | PRN
  Administered 2018-08-16: 10 mL via INTRAVENOUS

## 2018-08-24 DIAGNOSIS — M25561 Pain in right knee: Secondary | ICD-10-CM | POA: Diagnosis not present

## 2018-08-24 DIAGNOSIS — J449 Chronic obstructive pulmonary disease, unspecified: Secondary | ICD-10-CM | POA: Diagnosis not present

## 2018-08-24 DIAGNOSIS — M25511 Pain in right shoulder: Secondary | ICD-10-CM | POA: Diagnosis not present

## 2018-08-24 DIAGNOSIS — N183 Chronic kidney disease, stage 3 (moderate): Secondary | ICD-10-CM | POA: Diagnosis not present

## 2018-08-24 DIAGNOSIS — E7800 Pure hypercholesterolemia, unspecified: Secondary | ICD-10-CM | POA: Diagnosis not present

## 2018-08-24 DIAGNOSIS — I129 Hypertensive chronic kidney disease with stage 1 through stage 4 chronic kidney disease, or unspecified chronic kidney disease: Secondary | ICD-10-CM | POA: Diagnosis not present

## 2018-08-24 DIAGNOSIS — I700 Atherosclerosis of aorta: Secondary | ICD-10-CM | POA: Diagnosis not present

## 2018-08-24 DIAGNOSIS — Z79899 Other long term (current) drug therapy: Secondary | ICD-10-CM | POA: Diagnosis not present

## 2018-09-06 DIAGNOSIS — L905 Scar conditions and fibrosis of skin: Secondary | ICD-10-CM | POA: Diagnosis not present

## 2018-09-06 DIAGNOSIS — C44729 Squamous cell carcinoma of skin of left lower limb, including hip: Secondary | ICD-10-CM | POA: Diagnosis not present

## 2018-09-09 DIAGNOSIS — C61 Malignant neoplasm of prostate: Secondary | ICD-10-CM | POA: Diagnosis not present

## 2018-09-09 DIAGNOSIS — D075 Carcinoma in situ of prostate: Secondary | ICD-10-CM | POA: Diagnosis not present

## 2018-09-20 ENCOUNTER — Other Ambulatory Visit: Payer: Self-pay

## 2018-09-20 ENCOUNTER — Ambulatory Visit (INDEPENDENT_AMBULATORY_CARE_PROVIDER_SITE_OTHER): Payer: Medicare Other | Admitting: Orthopaedic Surgery

## 2018-09-20 ENCOUNTER — Ambulatory Visit (INDEPENDENT_AMBULATORY_CARE_PROVIDER_SITE_OTHER): Payer: Medicare Other

## 2018-09-20 ENCOUNTER — Encounter: Payer: Self-pay | Admitting: Orthopaedic Surgery

## 2018-09-20 VITALS — BP 129/75 | HR 85 | Ht 71.0 in | Wt 200.0 lb

## 2018-09-20 DIAGNOSIS — M25511 Pain in right shoulder: Secondary | ICD-10-CM | POA: Diagnosis not present

## 2018-09-20 DIAGNOSIS — G8929 Other chronic pain: Secondary | ICD-10-CM | POA: Diagnosis not present

## 2018-09-20 DIAGNOSIS — M1711 Unilateral primary osteoarthritis, right knee: Secondary | ICD-10-CM

## 2018-09-20 DIAGNOSIS — M25561 Pain in right knee: Secondary | ICD-10-CM | POA: Diagnosis not present

## 2018-09-20 NOTE — Progress Notes (Signed)
Office Visit Note   Patient: Marc Gilbert           Date of Birth: 01/30/1931           MRN: [id number] Visit Date: 09/20/2018              Requested by: Marc Manes, MD [address] [place],  [place] [id number] PCP: Marc Manes, MD   Assessment & Plan: Visit Diagnoses:  1. Chronic right shoulder pain   2. Chronic pain of right knee   3. Unilateral primary osteoarthritis, right knee     Plan: Advanced osteoarthritis right knee and right shoulder.  Long discussion regarding x-rays and treatment options.  Marc Gilbert is actually doing quite well with exercises and occasional Tylenol.  I have suggested he might want to try a Voltaren gel.  Continue with exercises.  Consider cortisone injection either the right shoulder or right knee if he has withdrawal Calcitrene pain.  No reason to consider surgery given that he is doing so well  Follow-Up Instructions: Return if symptoms worsen or fail to improve.   Orders:  Orders Placed This Encounter  Procedures  . XR KNEE 3 VIEW RIGHT  . XR Shoulder Right   No orders of the defined types were placed in this encounter.     Procedures: No procedures performed   Clinical Data: No additional findings.   Subjective: Chief Complaint  Patient presents with  . Right Shoulder - Pain  . Right Knee - Pain  Patient presents today for right shoulder and right knee pain. Patient states that his right shoulder has hurt for several years, but progressively getting worse. His shoulder pain is located anteriorly. It can be painful to lift his arm after letting it rest for awhile. He has some decreased range of motion.  His right knee has hurt for months. His pain is located medially. He states that his patella crunches and feels loose at times. He has been taking tylenol as needed. Living independently at [facility] with his wife.  Performs exercises on a daily basis.  No numbness or tingling.  Very minimal compromise of his  activities with either the right shoulder or right knee  HPI  Review of Systems  Constitutional: Negative for fatigue.  HENT: Negative for ear pain.   Eyes: Negative for pain.  Respiratory: Positive for shortness of breath.   Cardiovascular: Negative for leg swelling.  Gastrointestinal: Negative for constipation and diarrhea.  Endocrine: Negative for cold intolerance and heat intolerance.  Genitourinary: Positive for difficulty urinating.  Musculoskeletal: Negative for joint swelling.  Skin: Negative for rash.  Allergic/Immunologic: Negative for food allergies.  Neurological: Negative for weakness.  Hematological: Does not bruise/bleed easily.  Psychiatric/Behavioral: Positive for sleep disturbance.     Objective: Vital Signs: BP 129/75   Pulse 85   Ht 5\' 11"  (1.803 m)   Wt 200 lb (90.7 kg)   BMI 27.89 kg/m   Physical Exam Constitutional:      Appearance: He is well-developed.  Eyes:     Pupils: Pupils are equal, round, and reactive to light.  Pulmonary:     Effort: Pulmonary effort is normal.  Skin:    General: Skin is warm and dry.  Neurological:     Mental Status: He is alert and oriented to person, place, and time.  Psychiatric:        Behavior: Behavior normal.     Ortho Exam awake alert and oriented x3.  Comfortable sitting.  Full overhead motion of right shoulder quickly.  No weakness.  Some loss of internal and external rotation but not a functional loss.  Biceps intact.  Skin intact.  Good grip and good release.  No pain with range of motion of cervical spine.  Right knee was not hot red warm or swollen.  No effusion.  Slight varus with weightbearing.  Predominately medial joint pain that was mild to moderate.  No patellar crepitation.  No instability.  No calf pain  Specialty Comments:  No specialty comments available.  Imaging: Xr Knee 3 View Right  Result Date: 09/20/2018 Films of the right knee were obtained in several projections.  There is  bone-on-bone in the medial compartment with peripheral osteophytes and subchondral sclerosis.  There is approximately 3 degrees of varus.  Surgical clips are noted medially prior vein harvest.  There are degenerative changes at the patellofemoral and medial compartments to a lesser extent.  No acute changes.  No evidence of CPPD  Xr Shoulder Right  Result Date: 09/20/2018 Films of the right shoulder obtained in several projections.  There is advanced osteoarthritis of the glenohumeral joint with subchondral sclerosis, humeral head flattening and spurring.  The joint space is significantly narrowed.  The humeral head is centered in the glenoid.  There appears to be a normal space between the humeral head and the acromium.  There are degenerative changes at the acromioclavicular joint.  No acute changes    PMFS History: Patient Active Problem List   Diagnosis Date Noted  . Pain in right shoulder 09/20/2018  . Unilateral primary osteoarthritis, right knee 09/20/2018  . CAD (coronary artery disease), native coronary artery 10/26/2016  . Coronary artery disease of native heart with stable angina pectoris ([place])   . Hx of CABG 08/14/2016  . Unstable angina ([place])   . Chest pain 08/10/2016  . CKD (chronic kidney disease) stage 3, GFR 30-59 ml/min (HCC) 08/30/2015  . Hyperlipidemia 08/30/2015  . Osteoarthritis of hip 05/01/2011  . Hypertension 05/01/2011  . COPD (chronic obstructive pulmonary disease) ([place]) 05/01/2011  . Cancer of skin, face 05/01/2011   Past Medical History:  Diagnosis Date  . Arthritis    "right hand; back" (08/30/2015)  . Cancer (HCC)    skin  squamous and basal  . Chronic lower back pain   . CKD (chronic kidney disease), stage III ([place])   . COPD (chronic obstructive pulmonary disease) (HCC)    no home O2  . Dysplastic colon polyp age 45   carcinoma in situ  . GERD (gastroesophageal reflux disease)    occ  . H/O hiatal hernia   . Hyperlipidemia   . Hypertension   .  Prostate cancer ([place]) 07/2014   active surveillance, Glisson 6    Family History  Problem Relation Age of Onset  . Cancer Mother        breast  . Diabetes Father     Past Surgical History:  Procedure Laterality Date  . CATARACT EXTRACTION W/ INTRAOCULAR LENS IMPLANT Left 08/27/15  . CORONARY ARTERY BYPASS GRAFT N/A 08/14/2016   Procedure: CORONARY ARTERY BYPASS GRAFTING (CABG)x2, using left internal mammary artery and right greater saphenous vein harvested endoscopically;  Surgeon: Gaye Pollack, MD;  Location: [facility];  Service: Open Heart Surgery;  Laterality: N/A;  . JOINT REPLACEMENT    . LEFT HEART CATH AND CORONARY ANGIOGRAPHY N/A 08/11/2016   Procedure: Left Heart Cath and Coronary Angiography;  Surgeon: Belva Crome, MD;  Location: [facility];  Service: Cardiovascular;  Laterality: N/A;  . MOHS SURGERY     multiple SCC  . PROSTATE BIOPSY  07/2014  . TEE WITHOUT CARDIOVERSION N/A 08/14/2016   Procedure: TRANSESOPHAGEAL ECHOCARDIOGRAM (TEE);  Surgeon: Gaye Pollack, MD;  Location: [facility];  Service: Open Heart Surgery;  Laterality: N/A;  . TONSILLECTOMY    . TOTAL HIP ARTHROPLASTY  05/26/2011   Procedure: TOTAL HIP ARTHROPLASTY;  Surgeon: Garald Balding, MD;  Location: [facility];  Service: Orthopedics;  Laterality: Right;  . ULTRASOUND GUIDANCE FOR VASCULAR ACCESS  08/11/2016   Procedure: Ultrasound Guidance For Vascular Access;  Surgeon: Belva Crome, MD;  Location: [facility];  Service: Cardiovascular;;   Social History   Occupational History  . Occupation: retired 1996  Tobacco Use  . Smoking status: Former Smoker    Packs/day: 0.50    Years: 25.00    Pack years: 12.50    Types: Cigarettes    Quit date: 05/24/1988    Years since quitting: 30.3  . Smokeless tobacco: Never Used  Substance and Sexual Activity  . Alcohol use: Yes    Alcohol/week: 10.0 standard drinks    Types: 5 Glasses of wine, 5 Cans of beer per week  . Drug use: No  . Sexual activity: Not  on file

## 2018-09-28 ENCOUNTER — Encounter: Payer: Self-pay | Admitting: Orthopaedic Surgery

## 2018-10-01 ENCOUNTER — Encounter: Payer: Self-pay | Admitting: Orthopaedic Surgery

## 2018-10-10 DIAGNOSIS — M79674 Pain in right toe(s): Secondary | ICD-10-CM | POA: Diagnosis not present

## 2018-10-10 DIAGNOSIS — M79675 Pain in left toe(s): Secondary | ICD-10-CM | POA: Diagnosis not present

## 2018-10-10 DIAGNOSIS — B351 Tinea unguium: Secondary | ICD-10-CM | POA: Diagnosis not present

## 2018-11-05 DIAGNOSIS — B351 Tinea unguium: Secondary | ICD-10-CM | POA: Diagnosis not present

## 2018-11-05 DIAGNOSIS — M79675 Pain in left toe(s): Secondary | ICD-10-CM | POA: Diagnosis not present

## 2018-11-05 DIAGNOSIS — M79674 Pain in right toe(s): Secondary | ICD-10-CM | POA: Diagnosis not present

## 2018-12-12 DIAGNOSIS — B351 Tinea unguium: Secondary | ICD-10-CM | POA: Diagnosis not present

## 2018-12-12 DIAGNOSIS — M79674 Pain in right toe(s): Secondary | ICD-10-CM | POA: Diagnosis not present

## 2018-12-12 DIAGNOSIS — M79675 Pain in left toe(s): Secondary | ICD-10-CM | POA: Diagnosis not present

## 2018-12-30 ENCOUNTER — Ambulatory Visit: Payer: Medicare Other | Admitting: Cardiology

## 2018-12-30 DIAGNOSIS — C61 Malignant neoplasm of prostate: Secondary | ICD-10-CM | POA: Diagnosis not present

## 2019-01-03 DIAGNOSIS — E785 Hyperlipidemia, unspecified: Secondary | ICD-10-CM | POA: Insufficient documentation

## 2019-01-03 NOTE — Progress Notes (Signed)
Cardiology Office Note   Date:  01/04/2019   ID:  Marc Gilbert, DOB Mar 10, 1930, MRN [id number]  PCP:  Lajean Manes, MD  Cardiologist:   Minus Breeding, MD    Chief Complaint  Patient presents with  . Coronary Artery Disease      History of Present Illness: Marc Gilbert is a 85 y.o. male who presents for follow up of CAD/CABG.    He had CABG 08/14/2016 w/ LIMA-LAD and SVG-OM.   Since I last saw him he has done quite well.  He lives in his retirement community and he is able to enjoy some of the exercises facilities although limited by rest.  The patient denies any new symptoms such as chest discomfort, neck or arm discomfort. There has been no new shortness of breath, PND or orthopnea. There have been no reported palpitations, presyncope or syncope.    Past Medical History:  Diagnosis Date  . Arthritis    "right hand; back" (08/30/2015)  . Cancer (HCC)    skin  squamous and basal  . Chronic lower back pain   . CKD (chronic kidney disease), stage III   . COPD (chronic obstructive pulmonary disease) (HCC)    no home O2  . Dysplastic colon polyp age 63   carcinoma in situ  . GERD (gastroesophageal reflux disease)    occ  . H/O hiatal hernia   . Hyperlipidemia   . Hypertension   . Prostate cancer ([facility]) 07/2014   active surveillance, Glisson 6    Past Surgical History:  Procedure Laterality Date  . CATARACT EXTRACTION W/ INTRAOCULAR LENS IMPLANT Left 08/27/15  . CORONARY ARTERY BYPASS GRAFT N/A 08/14/2016   Procedure: CORONARY ARTERY BYPASS GRAFTING (CABG)x2, using left internal mammary artery and right greater saphenous vein harvested endoscopically;  Surgeon: Gaye Pollack, MD;  Location: [facility];  Service: Open Heart Surgery;  Laterality: N/A;  . JOINT REPLACEMENT    . LEFT HEART CATH AND CORONARY ANGIOGRAPHY N/A 08/11/2016   Procedure: Left Heart Cath and Coronary Angiography;  Surgeon: Belva Crome, MD;  Location: [facility];  Service: Cardiovascular;   Laterality: N/A;  . MOHS SURGERY     multiple SCC  . PROSTATE BIOPSY  07/2014  . TEE WITHOUT CARDIOVERSION N/A 08/14/2016   Procedure: TRANSESOPHAGEAL ECHOCARDIOGRAM (TEE);  Surgeon: Gaye Pollack, MD;  Location: [facility];  Service: Open Heart Surgery;  Laterality: N/A;  . TONSILLECTOMY    . TOTAL HIP ARTHROPLASTY  05/26/2011   Procedure: TOTAL HIP ARTHROPLASTY;  Surgeon: Garald Balding, MD;  Location: [facility];  Service: Orthopedics;  Laterality: Right;  . ULTRASOUND GUIDANCE FOR VASCULAR ACCESS  08/11/2016   Procedure: Ultrasound Guidance For Vascular Access;  Surgeon: Belva Crome, MD;  Location: [facility];  Service: Cardiovascular;;     Current Outpatient Medications  Medication Sig Dispense Refill  . acetaminophen (TYLENOL) 500 MG tablet Take 500-1,000 mg by mouth every 6 (six) hours as needed for mild pain or moderate pain.    Marland Kitchen AMOXICILLIN PO Take 4 tablets by mouth as needed.    Marland Kitchen aspirin 81 MG tablet Take 81 mg by mouth daily.    . Multiple Vitamins-Minerals (ONE-A-DAY MENS 50+ ADVANTAGE) TABS Take 1 tablet by mouth daily with breakfast.    . Omega-3 Fatty Acids (FISH OIL OMEGA-3 PO) Take 1,400 Units by mouth daily.    . psyllium (METAMUCIL) 58.6 % packet Take 1 packet by mouth 2 (two) times  daily.     . Tamsulosin HCl (FLOMAX) 0.4 MG CAPS Take 0.8 mg by mouth daily.     Marland Kitchen atorvastatin (LIPITOR) 40 MG tablet Take 1 tablet (40 mg total) by mouth daily. 90 tablet 3  . metoprolol succinate (TOPROL-XL) 50 MG 24 hr tablet TAKE 1 TABLET 2 TIMES DAILY. TAKE WITH OR IMMEDIATELY FOLLOWING A MEAL. 180 tablet 3   No current facility-administered medications for this visit.     Allergies:   No known allergies    ROS:  Please see the history of present illness.   Otherwise, review of systems are positive for none.   All other systems are reviewed and negative.    PHYSICAL EXAM: VS:  BP 112/65   Pulse 78   Temp (!) 97.2 F (36.2 C)   Ht 6' (1.829 m)   Wt 213 lb 3.2 oz (96.7  kg)   SpO2 96%   BMI 28.92 kg/m  , BMI Body mass index is 28.92 kg/m. GENERAL:  Well appearing NECK:  No jugular venous distention, waveform within normal limits, carotid upstroke brisk and symmetric, no bruits, no thyromegaly LUNGS:  Clear to auscultation bilaterally CHEST:  Well healed sternotomy scar. HEART:  PMI not displaced or sustained,S1 and S2 within normal limits, no S3, no S4, no clicks, no rubs, no murmurs ABD:  Flat, positive bowel sounds normal in frequency in pitch, no bruits, no rebound, no guarding, no midline pulsatile mass, no hepatomegaly, no splenomegaly EXT:  2 plus pulses throughout, no edema, no cyanosis no clubbing    EKG:  EKG is  ordered today. Sinus rhythm, rate 73, RSR prime V1 and V2, nonspecific T wave changes.  No change from previous.   Recent Labs: 08/16/2018: Creatinine, Ser 1.20    Lipid Panel    Component Value Date/Time   CHOL 157 08/10/2016 1302   TRIG 72 08/10/2016 1302   HDL 56 08/10/2016 1302   CHOLHDL 2.8 08/10/2016 1302   VLDL 14 08/10/2016 1302   LDLCALC 87 08/10/2016 1302      Wt Readings from Last 3 Encounters:  01/04/19 213 lb 3.2 oz (96.7 kg)  09/20/18 200 lb (90.7 kg)  12/23/17 207 lb (93.9 kg)      Other studies Reviewed: Additional studies/ records that were reviewed today include: Labs Review of the above records demonstrates:  NA   ASSESSMENT AND PLAN:   CAD:   The patient has no new sypmtoms.  No further cardiovascular testing is indicated.  We will continue with aggressive risk reduction and meds as listed.  HTN:  The blood pressure is at target.  No change in therapy.   DYSLIPIDEMIA:    LDL was 53 and HDL 51.  Continue current therapy.   PREOP:   The patient has an elevated PSA and might need to have biopsy.  There would be no cardiac contraindication to this.  If necessary he could hold his aspirin for 5 days without any bridging therapies.    Current medicines are reviewed at length with the patient  today.  The patient does not have concerns regarding medicines.  The following changes have been made: None  Labs/ tests ordered today include:  None   Orders Placed This Encounter  Procedures  . EKG 12-Lead     Disposition:   FU with me in 12 months.     Signed, Minus Breeding, MD  01/04/2019 11:13 AM    [place]

## 2019-01-04 ENCOUNTER — Other Ambulatory Visit: Payer: Self-pay

## 2019-01-04 ENCOUNTER — Encounter: Payer: Self-pay | Admitting: Cardiology

## 2019-01-04 ENCOUNTER — Ambulatory Visit (INDEPENDENT_AMBULATORY_CARE_PROVIDER_SITE_OTHER): Payer: Medicare Other | Admitting: Cardiology

## 2019-01-04 VITALS — BP 112/65 | HR 78 | Temp 97.2°F | Ht 72.0 in | Wt 213.2 lb

## 2019-01-04 DIAGNOSIS — I2510 Atherosclerotic heart disease of native coronary artery without angina pectoris: Secondary | ICD-10-CM

## 2019-01-04 DIAGNOSIS — E785 Hyperlipidemia, unspecified: Secondary | ICD-10-CM

## 2019-01-04 DIAGNOSIS — I10 Essential (primary) hypertension: Secondary | ICD-10-CM

## 2019-01-04 MED ORDER — ATORVASTATIN CALCIUM 40 MG PO TABS: 40.0000 mg | ORAL_TABLET | Freq: Every day | ORAL | 3 refills | Status: DC

## 2019-01-04 MED ORDER — METOPROLOL SUCCINATE ER 50 MG PO TB24: ORAL_TABLET | ORAL | 3 refills | Status: DC

## 2019-01-04 NOTE — Patient Instructions (Signed)
Medication Instructions:  Your physician recommends that you continue on your current medications as directed. Please refer to the Current Medication list given to you today.  If you need a refill on your cardiac medications before your next appointment, please call your pharmacy.   Lab work: NONE  Testing/Procedures: NONE  Follow-Up: At [facility], you and your health needs are our priority.  As part of our continuing mission to provide you with exceptional heart care, we have created designated Provider Care Teams.  These Care Teams include your primary Cardiologist (physician) and Advanced Practice Providers (APPs -  Physician Assistants and Nurse Practitioners) who all work together to provide you with the care you need, when you need it. You may see Dr Percival Spanish or one of the following Advanced Practice Providers on your designated Care Team:    [name], PA-C  Jory Sims, DNP, ANP  Cadence Kathlen Mody, NP  Your physician wants you to follow-up in: 1 year. You will receive a reminder letter in the mail two months in advance. If you don't receive a letter, please call our office to schedule the follow-up appointment.

## 2019-01-09 ENCOUNTER — Other Ambulatory Visit (HOSPITAL_COMMUNITY): Payer: Self-pay | Admitting: Urology

## 2019-01-09 ENCOUNTER — Other Ambulatory Visit: Payer: Self-pay | Admitting: Urology

## 2019-01-09 DIAGNOSIS — C61 Malignant neoplasm of prostate: Secondary | ICD-10-CM

## 2019-01-27 ENCOUNTER — Encounter (HOSPITAL_COMMUNITY)
Admission: RE | Admit: 2019-01-27 | Discharge: 2019-01-27 | Disposition: A | Discharge status: Home / Self Care | Payer: Medicare Other | Source: Ambulatory Visit | Attending: Urology | Admitting: Urology

## 2019-01-27 ENCOUNTER — Other Ambulatory Visit: Payer: Self-pay

## 2019-01-27 DIAGNOSIS — C61 Malignant neoplasm of prostate: Secondary | ICD-10-CM

## 2019-01-27 MED ORDER — TECHNETIUM TC 99M MEDRONATE IV KIT
22.0000 | PACK | Freq: Once | INTRAVENOUS | Status: AC | PRN
  Administered 2019-01-27: 22 via INTRAVENOUS

## 2019-02-14 DIAGNOSIS — Z23 Encounter for immunization: Secondary | ICD-10-CM | POA: Diagnosis not present

## 2019-03-08 DIAGNOSIS — N1831 Chronic kidney disease, stage 3a: Secondary | ICD-10-CM | POA: Diagnosis not present

## 2019-03-08 DIAGNOSIS — G8929 Other chronic pain: Secondary | ICD-10-CM | POA: Diagnosis not present

## 2019-03-08 DIAGNOSIS — K5901 Slow transit constipation: Secondary | ICD-10-CM | POA: Diagnosis not present

## 2019-03-08 DIAGNOSIS — I700 Atherosclerosis of aorta: Secondary | ICD-10-CM | POA: Diagnosis not present

## 2019-03-08 DIAGNOSIS — C61 Malignant neoplasm of prostate: Secondary | ICD-10-CM | POA: Diagnosis not present

## 2019-03-08 DIAGNOSIS — E7800 Pure hypercholesterolemia, unspecified: Secondary | ICD-10-CM | POA: Diagnosis not present

## 2019-03-08 DIAGNOSIS — I129 Hypertensive chronic kidney disease with stage 1 through stage 4 chronic kidney disease, or unspecified chronic kidney disease: Secondary | ICD-10-CM | POA: Diagnosis not present

## 2019-03-08 DIAGNOSIS — R0602 Shortness of breath: Secondary | ICD-10-CM | POA: Diagnosis not present

## 2019-03-08 DIAGNOSIS — M25561 Pain in right knee: Secondary | ICD-10-CM | POA: Diagnosis not present

## 2019-03-08 DIAGNOSIS — M25511 Pain in right shoulder: Secondary | ICD-10-CM | POA: Diagnosis not present

## 2019-03-09 ENCOUNTER — Other Ambulatory Visit: Payer: Self-pay

## 2019-03-09 ENCOUNTER — Encounter: Payer: Self-pay | Admitting: Orthopaedic Surgery

## 2019-03-09 ENCOUNTER — Ambulatory Visit (INDEPENDENT_AMBULATORY_CARE_PROVIDER_SITE_OTHER): Payer: Medicare Other | Admitting: Orthopaedic Surgery

## 2019-03-09 VITALS — Ht 72.0 in | Wt 213.0 lb

## 2019-03-09 DIAGNOSIS — M25511 Pain in right shoulder: Secondary | ICD-10-CM

## 2019-03-09 DIAGNOSIS — G8929 Other chronic pain: Secondary | ICD-10-CM

## 2019-03-09 MED ORDER — METHYLPREDNISOLONE ACETATE 40 MG/ML IJ SUSP
80.0000 mg | INTRAMUSCULAR | Status: AC | PRN
  Administered 2019-03-09: 15:00:00 80 mg via INTRA_ARTICULAR

## 2019-03-09 MED ORDER — BUPIVACAINE HCL 0.5 % IJ SOLN
2.0000 mL | INTRAMUSCULAR | Status: AC | PRN
  Administered 2019-03-09: 15:00:00 2 mL via INTRA_ARTICULAR

## 2019-03-09 MED ORDER — LIDOCAINE HCL 1 % IJ SOLN
2.0000 mL | INTRAMUSCULAR | Status: AC | PRN
  Administered 2019-03-09: 15:00:00 2 mL

## 2019-03-09 NOTE — Progress Notes (Signed)
Office Visit Note   Patient: Marc Gilbert           Date of Birth: 11/14/1930           MRN: [id number] Visit Date: 03/09/2019              Requested by: Lajean Manes, MD [address] [place],  [place] [address] PCP: Lajean Manes, MD   Assessment & Plan: Visit Diagnoses:  1. Chronic right shoulder pain     Plan: Marc Gilbert has had prior films demonstrating significant osteoarthritis of his right shoulder.  He actually has done fairly well until recently when he started having more trouble to the point of compromise.  He is having trouble raising his arm over his head and sleeping at night.  We had a long discussion regarding his diagnosis and treatment options.  He has been using Voltaren gel without much relief.  I will inject the joint with cortisone and plan to see him back as needed.  He is aware of the definitive treatment of shoulder replacement but he feels like he is not at the point where he needs to consider that  Follow-Up Instructions: Return if symptoms worsen or fail to improve.   Orders:  Orders Placed This Encounter  Procedures  . Large Joint Inj: R glenohumeral   No orders of the defined types were placed in this encounter.     Procedures: Large Joint Inj: R glenohumeral on 03/09/2019 2:53 PM Indications: pain and diagnostic evaluation Details: 25 G 1.5 in needle, anteromedial approach  Arthrogram: No  Medications: 2 mL lidocaine 1 %; 2 mL bupivacaine 0.5 %; 80 mg methylPREDNISolone acetate 40 MG/ML Consent was given by the patient. Immediately prior to procedure a time out was called to verify the correct patient, procedure, equipment, support staff and site/side marked as required. Patient was prepped and draped in the usual sterile fashion.       Clinical Data: No additional findings.   Subjective: Chief Complaint  Patient presents with  . Right Shoulder - Pain  Patient presents today for persistent right shoulder pain. He was  here in August of 2020. He states that he was offered a cortisone injection at the time, but declined. He said that he has been applying voltaren gel but it has not helped his shoulder. He comes back today to try a cortisone injection. He has taken Tylenol as needed.    Review of Systems   Objective: Vital Signs: Ht 6' (1.829 m)   Wt 213 lb (96.6 kg)   BMI 28.89 kg/m   Physical Exam Constitutional:      Appearance: He is well-developed.  Eyes:     Pupils: Pupils are equal, round, and reactive to light.  Pulmonary:     Effort: Pulmonary effort is normal.  Skin:    General: Skin is warm and dry.  Neurological:     Mental Status: He is alert and oriented to person, place, and time.  Psychiatric:        Behavior: Behavior normal.     Ortho Exam awake alert and oriented x3.  Comfortable sitting.  No shortness of breath or chest pain.  Hearing aids.  Able to place his right arm over his head fairly quickly with just minimal circuitous motion.  Some loss of external rotation but no crepitation.  Biceps appears to be intact.  Good strength with internal and external rotation.  Specialty Comments:  No specialty comments available.  Imaging: No results found.   PMFS History: Patient Active Problem List   Diagnosis Date Noted  . Dyslipidemia 01/03/2019  . Pain in right shoulder 09/20/2018  . Unilateral primary osteoarthritis, right knee 09/20/2018  . CAD (coronary artery disease), native coronary artery 10/26/2016  . Coronary artery disease of native heart with stable angina pectoris ([facility])   . Hx of CABG 08/14/2016  . Unstable angina ([facility])   . Chest pain 08/10/2016  . CKD (chronic kidney disease) stage 3, GFR 30-59 ml/min 08/30/2015  . Hyperlipidemia 08/30/2015  . Osteoarthritis of hip 05/01/2011  . Hypertension 05/01/2011  . COPD (chronic obstructive pulmonary disease) ([facility]) 05/01/2011  . Cancer of skin, face 05/01/2011   Past Medical History:  Diagnosis Date  .  Arthritis    "right hand; back" (08/30/2015)  . Cancer (HCC)    skin  squamous and basal  . Chronic lower back pain   . CKD (chronic kidney disease), stage III   . COPD (chronic obstructive pulmonary disease) (HCC)    no home O2  . Dysplastic colon polyp age 39   carcinoma in situ  . GERD (gastroesophageal reflux disease)    occ  . H/O hiatal hernia   . Hyperlipidemia   . Hypertension   . Prostate cancer ([facility]) 07/2014   active surveillance, Glisson 6    Family History  Problem Relation Age of Onset  . Cancer Mother        breast  . Diabetes Father     Past Surgical History:  Procedure Laterality Date  . CATARACT EXTRACTION W/ INTRAOCULAR LENS IMPLANT Left 08/27/15  . CORONARY ARTERY BYPASS GRAFT N/A 08/14/2016   Procedure: CORONARY ARTERY BYPASS GRAFTING (CABG)x2, using left internal mammary artery and right greater saphenous vein harvested endoscopically;  Surgeon: Gaye Pollack, MD;  Location: [facility];  Service: Open Heart Surgery;  Laterality: N/A;  . JOINT REPLACEMENT    . LEFT HEART CATH AND CORONARY ANGIOGRAPHY N/A 08/11/2016   Procedure: Left Heart Cath and Coronary Angiography;  Surgeon: Belva Crome, MD;  Location: [facility];  Service: Cardiovascular;  Laterality: N/A;  . MOHS SURGERY     multiple SCC  . PROSTATE BIOPSY  07/2014  . TEE WITHOUT CARDIOVERSION N/A 08/14/2016   Procedure: TRANSESOPHAGEAL ECHOCARDIOGRAM (TEE);  Surgeon: Gaye Pollack, MD;  Location: [facility];  Service: Open Heart Surgery;  Laterality: N/A;  . TONSILLECTOMY    . TOTAL HIP ARTHROPLASTY  05/26/2011   Procedure: TOTAL HIP ARTHROPLASTY;  Surgeon: Garald Balding, MD;  Location: [facility];  Service: Orthopedics;  Laterality: Right;  . ULTRASOUND GUIDANCE FOR VASCULAR ACCESS  08/11/2016   Procedure: Ultrasound Guidance For Vascular Access;  Surgeon: Belva Crome, MD;  Location: [facility];  Service: Cardiovascular;;   Social History   Occupational History  . Occupation: retired 1996   Tobacco Use  . Smoking status: Former Smoker    Packs/day: 0.50    Years: 25.00    Pack years: 12.50    Types: Cigarettes    Quit date: 05/24/1988    Years since quitting: 30.8  . Smokeless tobacco: Never Used  Substance and Sexual Activity  . Alcohol use: Yes    Alcohol/week: 10.0 standard drinks    Types: 5 Glasses of wine, 5 Cans of beer per week  . Drug use: No  . Sexual activity: Not on file

## 2019-03-13 DIAGNOSIS — M79675 Pain in left toe(s): Secondary | ICD-10-CM | POA: Diagnosis not present

## 2019-03-13 DIAGNOSIS — B351 Tinea unguium: Secondary | ICD-10-CM | POA: Diagnosis not present

## 2019-03-13 DIAGNOSIS — M79674 Pain in right toe(s): Secondary | ICD-10-CM | POA: Diagnosis not present

## 2019-03-14 DIAGNOSIS — Z23 Encounter for immunization: Secondary | ICD-10-CM | POA: Diagnosis not present

## 2019-03-29 DIAGNOSIS — C61 Malignant neoplasm of prostate: Secondary | ICD-10-CM | POA: Diagnosis not present

## 2019-03-30 DIAGNOSIS — H35372 Puckering of macula, left eye: Secondary | ICD-10-CM | POA: Diagnosis not present

## 2019-03-30 DIAGNOSIS — Z961 Presence of intraocular lens: Secondary | ICD-10-CM | POA: Diagnosis not present

## 2019-03-30 DIAGNOSIS — H524 Presbyopia: Secondary | ICD-10-CM | POA: Diagnosis not present

## 2019-04-05 DIAGNOSIS — C61 Malignant neoplasm of prostate: Secondary | ICD-10-CM | POA: Diagnosis not present

## 2019-04-05 DIAGNOSIS — R351 Nocturia: Secondary | ICD-10-CM | POA: Diagnosis not present

## 2019-04-05 DIAGNOSIS — N401 Enlarged prostate with lower urinary tract symptoms: Secondary | ICD-10-CM | POA: Diagnosis not present

## 2019-04-24 ENCOUNTER — Encounter: Payer: Self-pay | Admitting: Radiation Oncology

## 2019-04-24 NOTE — Progress Notes (Signed)
GU Location of Tumor / Histology: prostatic adenocarcinoma  If Prostate Cancer, Gleason Score is (3 + 4) and PSA is (8.30) on 07/14/2018.  Farron L Doby had an initial prostate biopsy in June 1993. Patient was diagnosed with prostate cancer June 2016. Patient opted for active surveillance. Patient returned for regular follow up on 04/05/2019 and PSA was 18.50.      Biopsies of prostate (if applicable) revealed:    Past/Anticipated interventions by urology, if any: prostate biopsy, active surveillance, CT scan negative, bone scan negative, genomic prostate score, referral for consideration radiotherapy  Past/Anticipated interventions by medical oncology, if any: no  Weight changes, if any: Denies  Bowel/Bladder complaints, if any: IPSS 11. SHIM 2. Denies dysuria, hematuria, or leakage.Taking Flomax 0.4 mg bid. Reports he has been taking Flomax bid. Denies any bowel complaints.    Nausea/Vomiting, if any: denies  Pain issues, if any:  Reports intermittent arthritic right shoulder and right knee pain.   SAFETY ISSUES:  Prior radiation? denies  Pacemaker/ICD? denies  Possible current pregnancy? no, male patient  Is the patient on methotrexate? no  Current Complaints / other details:  86 year old male. Married. Former smoker.Patient's mother and daughter dx with breast ca.  Daughter is a physician, Margaretha Glassing, MD. She is participating in the consult today.

## 2019-04-25 ENCOUNTER — Ambulatory Visit: Payer: Medicare Other

## 2019-04-25 ENCOUNTER — Ambulatory Visit: Payer: Medicare Other | Admitting: Radiation Oncology

## 2019-04-25 ENCOUNTER — Encounter: Payer: Self-pay | Admitting: Radiation Oncology

## 2019-04-25 ENCOUNTER — Ambulatory Visit
Admission: RE | Admit: 2019-04-25 | Discharge: 2019-04-25 | Disposition: A | Discharge status: Home / Self Care | Payer: Medicare Other | Source: Ambulatory Visit | Attending: Radiation Oncology | Admitting: Radiation Oncology

## 2019-04-25 ENCOUNTER — Other Ambulatory Visit: Payer: Self-pay

## 2019-04-25 VITALS — Ht 72.0 in | Wt 205.0 lb

## 2019-04-25 DIAGNOSIS — C61 Malignant neoplasm of prostate: Secondary | ICD-10-CM

## 2019-04-25 DIAGNOSIS — R972 Elevated prostate specific antigen [PSA]: Secondary | ICD-10-CM | POA: Diagnosis not present

## 2019-04-25 NOTE — Progress Notes (Signed)
Radiation Oncology         (336) [phone] ________________________________  Initial Outpatient Consultation - Conducted via MyChart due to current COVID-19 concerns for limiting patient exposure  Name: Marc Gilbert MRN: [id number]  Date: 04/25/2019  DOB: 03-21-1930  [id number], Christiane Ha, MD  Franchot Gallo, MD   REFERRING PHYSICIAN: Franchot Gallo, MD  DIAGNOSIS: 86 y.o. gentleman with Stage T2a adenocarcinoma of the prostate with Gleason score of 3+4, and PSA of 18.5.    ICD-10-CM   1. Malignant neoplasm of prostate ([place])  C61     HISTORY OF PRESENT ILLNESS: Marc Gilbert is a 86 y.o. male with a diagnosis of prostate cancer. He was initially noted to have an elevated PSA of 5.8 back in 1993 and underwent prostate biopsy on 07/18/1991 under the care and direction of Dr. Diona Fanti, which was benign with prostatitis only.  He has remained under Dr. Alan Ripper care since that time.  His PSA dropped below 4 until 05/2013 when it rose to 4.5.  He was also noted to have a prostate nodule on DRE at this time and therefore proceeded to repeat prostate biopsy on 07/23/2014 with a PSA of 5.3 at the time of biopsy.  He was found to have Gleason 3+3 prostate cancer in 1 out of 12 cores and an Oncotype DX showed a score of 41 indicating a low to very low risk of aggressive prostate cancer.  He elected to proceed with active surveillance at that time.  A surveillance prostate MRI performed in 09/2015 showed no evidence of high grade lesions or concerning findings.  This was followed by surveillance biopsy a week later that showed HG/PIN without discrete adenocarcinoma.  His PSA had remained stable until 07/14/18 when it was noted further elevated at 8.3. A repeat prostate MRI was performed on 08/16/2018 and revealed two separate PIRADS 4 lesions with a 2.3 cm lesion in the left posterolateral/anterior peripheral zone, and a 1.2 cm lesion in the right anterior peripheral zone of the right mid/apical gland.  He  proceeded with MRI fusion biopsy on 09/09/2018.  Prostate volume was 52 grams. All 6 cores from the 2 ROI lesions were negative but he was found to have Gleason 3+4 disease in 2 of the 12 systematic core biopsy samples. After reviewing the biopsy results, he elected to continue in active surveillance.  His PSA continued to rise on follow up with a PSA of 11.10 on 11/20/18 and further increased to 14.3 on 12/21/18. Staging studies with a bone scan and CT A/P were performed on 01/27/2019 and fortunately showed no evidence of osseous or visceral metastatic disease.  Most recently, his PSA was noted to be further elevated at 18.5 on 03/29/2019.  The right mid gland prostate nodule was noted to be stable but due to the persistent and more rapid rise in PSA over a 6 month period, the recommendation was to consider active treatment.   The patient reviewed the PSA and biopsy results with his urologist and he has kindly been referred today for discussion of potential radiation treatment options.   PREVIOUS RADIATION THERAPY: No  PAST MEDICAL HISTORY:  Past Medical History:  Diagnosis Date   Arthritis    "right hand; back" (08/30/2015)   Cancer (HCC)    skin  squamous and basal   Chronic lower back pain    CKD (chronic kidney disease), stage III    COPD (chronic obstructive pulmonary disease) ([place])    no home O2   Dysplastic colon polyp  age 64   carcinoma in situ   GERD (gastroesophageal reflux disease)    occ   H/O hiatal hernia    Hyperlipidemia    Hypertension    Prostate cancer ([place]) 07/2014   active surveillance, [place] 6      PAST SURGICAL HISTORY: Past Surgical History:  Procedure Laterality Date   CATARACT EXTRACTION W/ INTRAOCULAR LENS IMPLANT Left 08/27/15   CORONARY ARTERY BYPASS GRAFT N/A 08/14/2016   Procedure: CORONARY ARTERY BYPASS GRAFTING (CABG)x2, using left internal mammary artery and right greater saphenous vein harvested endoscopically;  Surgeon: Gaye Pollack,  MD;  Location: [facility];  Service: Open Heart Surgery;  Laterality: N/A;   JOINT REPLACEMENT     LEFT HEART CATH AND CORONARY ANGIOGRAPHY N/A 08/11/2016   Procedure: Left Heart Cath and Coronary Angiography;  Surgeon: Belva Crome, MD;  Location: [facility];  Service: Cardiovascular;  Laterality: N/A;   MOHS SURGERY     multiple SCC   PROSTATE BIOPSY  07/2014   TEE WITHOUT CARDIOVERSION N/A 08/14/2016   Procedure: TRANSESOPHAGEAL ECHOCARDIOGRAM (TEE);  Surgeon: Gaye Pollack, MD;  Location: [facility];  Service: Open Heart Surgery;  Laterality: N/A;   TONSILLECTOMY     TOTAL HIP ARTHROPLASTY  05/26/2011   Procedure: TOTAL HIP ARTHROPLASTY;  Surgeon: Garald Balding, MD;  Location: [facility];  Service: Orthopedics;  Laterality: Right;   ULTRASOUND GUIDANCE FOR VASCULAR ACCESS  08/11/2016   Procedure: Ultrasound Guidance For Vascular Access;  Surgeon: Belva Crome, MD;  Location: [facility];  Service: Cardiovascular;;    FAMILY HISTORY:  Family History  Problem Relation Age of Onset   Breast cancer Mother 23       mastectomy   Diabetes Father    Breast cancer Daughter        2005. Lumpectomy with chemo and radiation. Under the care of Dr. Jana Hakim     Pancreatic cancer Neg Hx    Colon cancer Neg Hx    Prostate cancer Neg Hx     SOCIAL HISTORY:  Social History   Socioeconomic History   Marital status: Married    Spouse name: Not on file   Number of children: Not on file   Years of education: Not on file   Highest education level: Not on file  Occupational History   Occupation: retired 1996  Tobacco Use   Smoking status: Former Smoker    Packs/day: 0.50    Years: 25.00    Pack years: 12.50    Types: Cigarettes    Quit date: 05/24/1988    Years since quitting: 30.9   Smokeless tobacco: Never Used  Substance and Sexual Activity   Alcohol use: Yes    Alcohol/week: 10.0 standard drinks    Types: 5 Glasses of wine, 5 Cans of beer per week   Drug  use: No   Sexual activity: Not Currently  Other Topics Concern   Not on file  Social History Narrative   Not on file   Social Determinants of Health   Financial Resource Strain:    Difficulty of Paying Living Expenses:   Food Insecurity:    Worried About Charity fundraiser in the Last Year:    Arboriculturist in the Last Year:   Transportation Needs:    Film/video editor (Medical):    Lack of Transportation (Non-Medical):   Physical Activity:    Days of Exercise per Week:    Minutes of Exercise  per Session:   Stress:    Feeling of Stress :   Social Connections:    Frequency of Communication with Friends and Family:    Frequency of Social Gatherings with Friends and Family:    Attends Religious Services:    Active Member of Clubs or Organizations:    Attends Archivist Meetings:    Marital Status:   Intimate Partner Violence:    Fear of Current or Ex-Partner:    Emotionally Abused:    Physically Abused:    Sexually Abused:     ALLERGIES: No known allergies  MEDICATIONS:  Current Outpatient Medications  Medication Sig Dispense Refill   acetaminophen (TYLENOL) 500 MG tablet Take 500-1,000 mg by mouth every 6 (six) hours as needed for mild pain or moderate pain.     aspirin 81 MG tablet Take 81 mg by mouth daily.     atorvastatin (LIPITOR) 40 MG tablet Take 1 tablet (40 mg total) by mouth daily. 90 tablet 3   diclofenac Sodium (VOLTAREN) 1 % GEL Apply topically 4 (four) times daily.     metoprolol succinate (TOPROL-XL) 50 MG 24 hr tablet TAKE 1 TABLET 2 TIMES DAILY. TAKE WITH OR IMMEDIATELY FOLLOWING A MEAL. 180 tablet 3   Multiple Vitamins-Minerals (ONE-A-DAY MENS 50+ ADVANTAGE) TABS Take 1 tablet by mouth daily with breakfast.     Omega-3 Fatty Acids (FISH OIL OMEGA-3 PO) Take 1,400 Units by mouth daily.     psyllium (METAMUCIL) 58.6 % packet Take 1 packet by mouth 2 (two) times daily.      Tamsulosin HCl (FLOMAX) 0.4 MG  CAPS Take 0.8 mg by mouth daily. Reports taking one tablet at night then one in the morning.     No current facility-administered medications for this encounter.    REVIEW OF SYSTEMS:  On review of systems, the patient reports that he is doing well overall. He denies any chest pain, shortness of breath, cough, fevers, chills, night sweats, unintended weight changes. He denies any bowel disturbances, and denies abdominal pain, nausea or vomiting. He denies any new musculoskeletal or joint aches or pains. His IPSS was 11, indicating moderate urinary symptoms despite taking Flomax daily. His SHIM was 2, indicating he has severe erectile dysfunction. A complete review of systems is obtained and is otherwise negative.    PHYSICAL EXAM:  Wt Readings from Last 3 Encounters:  04/25/19 205 lb (93 kg)  03/09/19 213 lb (96.6 kg)  01/04/19 213 lb 3.2 oz (96.7 kg)   Temp Readings from Last 3 Encounters:  01/04/19 (!) 97.2 F (36.2 C)  08/19/16 99.1 F (37.3 C) (Oral)  11/30/15 98.2 F (36.8 C) (Oral)   BP Readings from Last 3 Encounters:  01/04/19 112/65  09/20/18 129/75  12/23/17 132/70   Pulse Readings from Last 3 Encounters:  01/04/19 78  09/20/18 85  12/23/17 71   Pain Assessment Pain Loc: (Reports arthritic right knee and right shoulder pain managed by Dr. Caryl Comes  In general this is a well appearing Caucasian gentleman in no acute distress. He's alert and oriented x4 and appropriate throughout the examination. Cardiopulmonary assessment is negative for acute distress and he exhibits normal effort.    KPS = 90  100 - Normal; no complaints; no evidence of disease. 90   - Able to carry on normal activity; minor signs or symptoms of disease. 80   - Normal activity with effort; some signs or symptoms of disease. 45   - Cares for self; unable to  carry on normal activity or to do active work. 60   - Requires occasional assistance, but is able to care for most of his personal  needs. 50   - Requires considerable assistance and frequent medical care. 65   - Disabled; requires special care and assistance. 74   - Severely disabled; hospital admission is indicated although death not imminent. 20   - Very sick; hospital admission necessary; active supportive treatment necessary. 10   - Moribund; fatal processes progressing rapidly. 0     - Dead  Karnofsky DA, Abelmann Uhland, Craver LS and Burchenal [facility] [phone]) The use of the nitrogen mustards in the palliative treatment of carcinoma: with particular reference to bronchogenic carcinoma Cancer 1 634-56  LABORATORY DATA:  Lab Results  Component Value Date   WBC 10.0 08/16/2016   HGB 11.3 (L) 08/16/2016   HCT 35.5 (L) 08/16/2016   MCV 93.9 08/16/2016   PLT 137 (L) 08/16/2016   Lab Results  Component Value Date   NA 131 (L) 08/16/2016   K 3.4 (L) 08/16/2016   CL 98 (L) 08/16/2016   CO2 26 08/16/2016   Lab Results  Component Value Date   ALT 20 08/30/2015   AST 20 08/30/2015   ALKPHOS 44 08/30/2015   BILITOT 0.8 08/30/2015     RADIOGRAPHY: No results found.    IMPRESSION/PLAN: This visit was conducted via MyChart to spare the patient unnecessary potential exposure in the healthcare setting during the current COVID-19 pandemic. 1. 86 y.o. gentleman with Stage T2a adenocarcinoma of the prostate with Gleason Score of 3+4, and PSA of 18.5. We discussed the patient's workup and outlined the nature of prostate cancer in this setting. The patient's T stage, Gleason's score, and PSA put him into the intermediate risk group. Accordingly, he is eligible for a variety of potential treatment options including brachytherapy or 5.5 weeks of external radiation with or without ST-ADT. We discussed the available radiation techniques, and focused on the details and logistics of delivery. We discussed and outlined the risks, benefits, short and long-term effects associated with radiotherapy. We discussed the role of SpaceOAR in  reducing the rectal toxicity associated with radiotherapy. We also detailed the role of ADT in the treatment of intermediate risk prostate cancer and outlined the associated side effects that could be expected with this therapy. In his case, we do recommend using ST-ADT in combination with radiation therapy in light of his PSA velocity. He and his daughter, Dr. Margaretha Glassing, were encouraged to ask questions that were answered to their stated satisfaction.   At the end of the conversation, the patient is interested in moving forward with 5.5 weeks of external beam therapy in combination with ST-ADT. He has not received his first Lupron injection so we will share our discussion with Dr. Diona Fanti and make arrangements for a follow up visit, first available, to start ADT now.  We will also coordinate for fiducial markers and SpaceOAR gel placement in mid-May, prior to simulation, to reduce rectal toxicity from radiotherapy, in anticipation of beginning his daily IMRT approximately 2 months after the start of ADT. He appears to have a good understanding of his disease and our treatment recommendations which are of curative intent and he is comfortable with and in agreement with the stated plan.  We enjoyed meeting him and his daughter today and look forward to continuing to participate in his care. We will move forward with treatment planning accordingly.  Given current concerns for patient exposure during the  COVID-19 pandemic, this encounter was conducted via video-enabled MyChart visit. The patient has given verbal consent for this type of encounter. The time spent during this encounter was 75 minutes. The attendants for this meeting include Tyler Pita MD, Ashlyn Bruning PA-C, Katie Daubenspeck- scribe, and patient, Marc Gilbert and his wife, Tomi Bamberger and daughter, Dr. Margaretha Glassing. During the encounter, Tyler Pita MD, Ashlyn Bruning PA-C, and scribe, Wilburn Mylar were located at [place].  Patient, Marc Gilbert and family were located at home.   Nicholos Johns, PA-C    Tyler Pita, MD  [facility] Direct Dial: [phone]   Fax: [phone] [email or web address]   Skype   [email or web address]  This document serves as a record of services personally performed by Tyler Pita, MD and Freeman Caldron, PA-C. It was created on their behalf by Wilburn Mylar, a trained medical scribe. The creation of this record is based on the scribe's personal observations and the provider's statements to them. This document has been checked and approved by the attending provider.

## 2019-04-28 ENCOUNTER — Telehealth: Payer: Self-pay | Admitting: *Deleted

## 2019-04-28 NOTE — Telephone Encounter (Signed)
CALLED PATIENT TO INFORM OF ADT INJ. FOR 05-31-19 - ARRIVAL TIME- 2:30 PM @ DR. DAHLSTEDT'S OFFICE, SPOKE WITH PATIENT AND HE IS AWARE OF THIS APPT.

## 2019-05-01 ENCOUNTER — Telehealth: Payer: Self-pay | Admitting: *Deleted

## 2019-05-01 NOTE — Telephone Encounter (Signed)
Returned patient's phone call, spoke with patient's wife 

## 2019-05-01 NOTE — Telephone Encounter (Signed)
Called patient to inform that I spoke with [facility] and they do not have anything any earlier than 05-31-19 for his ADT, he has been put on a wait list and if that have a cancellation they will get him in earlier, patient verified understanding this

## 2019-05-05 DIAGNOSIS — Z5111 Encounter for antineoplastic chemotherapy: Secondary | ICD-10-CM | POA: Diagnosis not present

## 2019-05-05 DIAGNOSIS — C61 Malignant neoplasm of prostate: Secondary | ICD-10-CM | POA: Diagnosis not present

## 2019-05-08 ENCOUNTER — Encounter: Payer: Self-pay | Admitting: Medical Oncology

## 2019-05-09 ENCOUNTER — Telehealth: Payer: Self-pay | Admitting: *Deleted

## 2019-05-09 NOTE — Telephone Encounter (Signed)
CALLED PATIENT TO INFORM THAT I HAVE CALLED [facility] TO OBTAIN A DATE AND TIME FOR PLACEMENT OF FID. MARKERS AND SPACE OAR GEL, I INFORMED PATIENT THAT I WOULD CALL HIM AS A I KNOW SOMETHING, PATIENT VERIFIED UNDERSTANDING THIS

## 2019-05-15 DIAGNOSIS — M79675 Pain in left toe(s): Secondary | ICD-10-CM | POA: Diagnosis not present

## 2019-05-15 DIAGNOSIS — M79674 Pain in right toe(s): Secondary | ICD-10-CM | POA: Diagnosis not present

## 2019-05-15 DIAGNOSIS — B351 Tinea unguium: Secondary | ICD-10-CM | POA: Diagnosis not present

## 2019-05-16 ENCOUNTER — Telehealth: Payer: Self-pay | Admitting: *Deleted

## 2019-05-16 NOTE — Telephone Encounter (Signed)
Called patient to inform of fid. markers and space oar placement on May 18 @ [facility] and his sim on May 21 @ 11 am @ [facility], spoke with patient and he is aware of these appts.

## 2019-05-23 ENCOUNTER — Other Ambulatory Visit: Payer: Self-pay | Admitting: Urology

## 2019-05-23 DIAGNOSIS — C61 Malignant neoplasm of prostate: Secondary | ICD-10-CM

## 2019-06-09 DIAGNOSIS — C61 Malignant neoplasm of prostate: Secondary | ICD-10-CM | POA: Diagnosis not present

## 2019-06-27 DIAGNOSIS — N1831 Chronic kidney disease, stage 3a: Secondary | ICD-10-CM | POA: Diagnosis not present

## 2019-06-27 DIAGNOSIS — Z1389 Encounter for screening for other disorder: Secondary | ICD-10-CM | POA: Diagnosis not present

## 2019-06-27 DIAGNOSIS — I129 Hypertensive chronic kidney disease with stage 1 through stage 4 chronic kidney disease, or unspecified chronic kidney disease: Secondary | ICD-10-CM | POA: Diagnosis not present

## 2019-06-27 DIAGNOSIS — J449 Chronic obstructive pulmonary disease, unspecified: Secondary | ICD-10-CM | POA: Diagnosis not present

## 2019-06-27 DIAGNOSIS — Z0000 Encounter for general adult medical examination without abnormal findings: Secondary | ICD-10-CM | POA: Diagnosis not present

## 2019-06-27 DIAGNOSIS — F5101 Primary insomnia: Secondary | ICD-10-CM | POA: Diagnosis not present

## 2019-06-27 DIAGNOSIS — Z79899 Other long term (current) drug therapy: Secondary | ICD-10-CM | POA: Diagnosis not present

## 2019-06-27 DIAGNOSIS — I700 Atherosclerosis of aorta: Secondary | ICD-10-CM | POA: Diagnosis not present

## 2019-06-27 DIAGNOSIS — C61 Malignant neoplasm of prostate: Secondary | ICD-10-CM | POA: Diagnosis not present

## 2019-06-27 DIAGNOSIS — E7800 Pure hypercholesterolemia, unspecified: Secondary | ICD-10-CM | POA: Diagnosis not present

## 2019-07-04 DIAGNOSIS — C61 Malignant neoplasm of prostate: Secondary | ICD-10-CM | POA: Diagnosis not present

## 2019-07-05 ENCOUNTER — Telehealth: Payer: Self-pay | Admitting: *Deleted

## 2019-07-05 NOTE — Telephone Encounter (Signed)
CALLED PATIENT TO INFORM THAT MRI WILL BE ON 07-20-19 - ARRIVAL TIME- 11:30 AM @ [facility] AND HIS SIM WILL BE ON 07-21-19 - ARRIVAL TIME- 12:45 PM @ [facility], SPOKE WITH PATIENT AND HE IS AWARE OF THESE APPTS. AND IS GOOD WITH THEM

## 2019-07-06 DIAGNOSIS — D225 Melanocytic nevi of trunk: Secondary | ICD-10-CM | POA: Diagnosis not present

## 2019-07-06 DIAGNOSIS — L578 Other skin changes due to chronic exposure to nonionizing radiation: Secondary | ICD-10-CM | POA: Diagnosis not present

## 2019-07-06 DIAGNOSIS — L570 Actinic keratosis: Secondary | ICD-10-CM | POA: Diagnosis not present

## 2019-07-06 DIAGNOSIS — D485 Neoplasm of uncertain behavior of skin: Secondary | ICD-10-CM | POA: Diagnosis not present

## 2019-07-06 DIAGNOSIS — Z85828 Personal history of other malignant neoplasm of skin: Secondary | ICD-10-CM | POA: Diagnosis not present

## 2019-07-06 DIAGNOSIS — L821 Other seborrheic keratosis: Secondary | ICD-10-CM | POA: Diagnosis not present

## 2019-07-06 DIAGNOSIS — C44519 Basal cell carcinoma of skin of other part of trunk: Secondary | ICD-10-CM | POA: Diagnosis not present

## 2019-07-06 DIAGNOSIS — Z86018 Personal history of other benign neoplasm: Secondary | ICD-10-CM | POA: Diagnosis not present

## 2019-07-07 ENCOUNTER — Ambulatory Visit: Payer: Medicare Other | Admitting: Radiation Oncology

## 2019-07-14 ENCOUNTER — Telehealth: Payer: Self-pay | Admitting: *Deleted

## 2019-07-14 NOTE — Telephone Encounter (Signed)
Called patient to remind of MRI for 07-20-19 and his sim on 07-21-19, lvm for a return call

## 2019-07-20 ENCOUNTER — Ambulatory Visit (HOSPITAL_COMMUNITY)
Admission: RE | Admit: 2019-07-20 | Discharge: 2019-07-20 | Disposition: A | Discharge status: Home / Self Care | Payer: Medicare Other | Source: Ambulatory Visit | Attending: Urology | Admitting: Urology

## 2019-07-20 ENCOUNTER — Other Ambulatory Visit: Payer: Self-pay

## 2019-07-20 DIAGNOSIS — C61 Malignant neoplasm of prostate: Secondary | ICD-10-CM

## 2019-07-21 ENCOUNTER — Encounter: Payer: Self-pay | Admitting: Medical Oncology

## 2019-07-21 ENCOUNTER — Ambulatory Visit
Admission: RE | Admit: 2019-07-21 | Discharge: 2019-07-21 | Disposition: A | Discharge status: Home / Self Care | Payer: Medicare Other | Source: Ambulatory Visit | Attending: Radiation Oncology | Admitting: Radiation Oncology

## 2019-07-21 ENCOUNTER — Other Ambulatory Visit: Payer: Self-pay

## 2019-07-21 DIAGNOSIS — C61 Malignant neoplasm of prostate: Secondary | ICD-10-CM | POA: Insufficient documentation

## 2019-07-21 DIAGNOSIS — Z510 Encounter for antineoplastic radiation therapy: Secondary | ICD-10-CM | POA: Diagnosis present

## 2019-07-24 DIAGNOSIS — M79674 Pain in right toe(s): Secondary | ICD-10-CM | POA: Diagnosis not present

## 2019-07-24 DIAGNOSIS — B351 Tinea unguium: Secondary | ICD-10-CM | POA: Diagnosis not present

## 2019-07-24 DIAGNOSIS — M79675 Pain in left toe(s): Secondary | ICD-10-CM | POA: Diagnosis not present

## 2019-07-25 ENCOUNTER — Ambulatory Visit: Payer: Medicare Other | Admitting: Radiation Oncology

## 2019-07-25 NOTE — Progress Notes (Signed)
  Radiation Oncology         (336) [phone] ________________________________  Name: Marc Gilbert MRN: [id number]  Date: 07/21/2019  DOB: 19-Nov-1930  SIMULATION AND TREATMENT PLANNING NOTE    ICD-10-CM   1. Malignant neoplasm of prostate ([place])  C61     DIAGNOSIS:  86 y.o. gentleman with Stage T2a adenocarcinoma of the prostate with Gleason score of 3+4, and PSA of 18.5  NARRATIVE:  The patient was brought to the [facility].  Identity was confirmed.  All relevant records and images related to the planned course of therapy were reviewed.  The patient freely provided informed written consent to proceed with treatment after reviewing the details related to the planned course of therapy. The consent form was witnessed and verified by the simulation staff.  Then, the patient was set-up in a stable reproducible supine position for radiation therapy.  A vacuum lock pillow device was custom fabricated to position his legs in a reproducible immobilized position.  Then, I performed a urethrogram under sterile conditions to identify the prostatic apex.  CT images were obtained.  Surface markings were placed.  The CT images were loaded into the planning software.  Then the prostate target and avoidance structures including the rectum, bladder, bowel and hips were contoured.  Treatment planning then occurred.  The radiation prescription was entered and confirmed.  A total of one complex treatment devices was fabricated. I have requested : Intensity Modulated Radiotherapy (IMRT) is medically necessary for this case for the following reason:  Rectal sparing.Marland Kitchen  PLAN:  The patient will receive 70 Gy in 28 fractions.  ________________________________  Sheral Apley Tammi Klippel, M.D.

## 2019-07-27 DIAGNOSIS — C61 Malignant neoplasm of prostate: Secondary | ICD-10-CM | POA: Diagnosis not present

## 2019-07-27 DIAGNOSIS — Z510 Encounter for antineoplastic radiation therapy: Secondary | ICD-10-CM | POA: Diagnosis not present

## 2019-08-01 ENCOUNTER — Encounter: Payer: Self-pay | Admitting: Medical Oncology

## 2019-08-01 ENCOUNTER — Other Ambulatory Visit: Payer: Self-pay

## 2019-08-01 ENCOUNTER — Ambulatory Visit
Admission: RE | Admit: 2019-08-01 | Discharge: 2019-08-01 | Disposition: A | Discharge status: Home / Self Care | Payer: Medicare Other | Source: Ambulatory Visit | Attending: Radiation Oncology | Admitting: Radiation Oncology

## 2019-08-01 DIAGNOSIS — C61 Malignant neoplasm of prostate: Secondary | ICD-10-CM | POA: Diagnosis not present

## 2019-08-01 DIAGNOSIS — Z510 Encounter for antineoplastic radiation therapy: Secondary | ICD-10-CM | POA: Diagnosis not present

## 2019-08-02 ENCOUNTER — Other Ambulatory Visit: Payer: Self-pay

## 2019-08-02 ENCOUNTER — Ambulatory Visit
Admission: RE | Admit: 2019-08-02 | Discharge: 2019-08-02 | Disposition: A | Discharge status: Home / Self Care | Payer: Medicare Other | Source: Ambulatory Visit | Attending: Radiation Oncology | Admitting: Radiation Oncology

## 2019-08-02 DIAGNOSIS — C61 Malignant neoplasm of prostate: Secondary | ICD-10-CM | POA: Diagnosis not present

## 2019-08-02 DIAGNOSIS — Z510 Encounter for antineoplastic radiation therapy: Secondary | ICD-10-CM | POA: Diagnosis not present

## 2019-08-03 ENCOUNTER — Other Ambulatory Visit: Payer: Self-pay

## 2019-08-03 ENCOUNTER — Ambulatory Visit
Admission: RE | Admit: 2019-08-03 | Discharge: 2019-08-03 | Disposition: A | Discharge status: Home / Self Care | Payer: Medicare Other | Source: Ambulatory Visit | Attending: Radiation Oncology | Admitting: Radiation Oncology

## 2019-08-03 DIAGNOSIS — C61 Malignant neoplasm of prostate: Secondary | ICD-10-CM | POA: Diagnosis not present

## 2019-08-03 DIAGNOSIS — Z510 Encounter for antineoplastic radiation therapy: Secondary | ICD-10-CM | POA: Diagnosis not present

## 2019-08-04 ENCOUNTER — Ambulatory Visit
Admission: RE | Admit: 2019-08-04 | Discharge: 2019-08-04 | Disposition: A | Discharge status: Home / Self Care | Payer: Medicare Other | Source: Ambulatory Visit | Attending: Radiation Oncology | Admitting: Radiation Oncology

## 2019-08-04 ENCOUNTER — Other Ambulatory Visit: Payer: Self-pay

## 2019-08-04 DIAGNOSIS — C61 Malignant neoplasm of prostate: Secondary | ICD-10-CM | POA: Diagnosis not present

## 2019-08-04 DIAGNOSIS — Z510 Encounter for antineoplastic radiation therapy: Secondary | ICD-10-CM | POA: Diagnosis not present

## 2019-08-07 ENCOUNTER — Other Ambulatory Visit: Payer: Self-pay

## 2019-08-07 ENCOUNTER — Ambulatory Visit
Admission: RE | Admit: 2019-08-07 | Discharge: 2019-08-07 | Disposition: A | Discharge status: Home / Self Care | Payer: Medicare Other | Source: Ambulatory Visit | Attending: Radiation Oncology | Admitting: Radiation Oncology

## 2019-08-07 DIAGNOSIS — C61 Malignant neoplasm of prostate: Secondary | ICD-10-CM | POA: Diagnosis not present

## 2019-08-07 DIAGNOSIS — Z510 Encounter for antineoplastic radiation therapy: Secondary | ICD-10-CM | POA: Diagnosis not present

## 2019-08-08 ENCOUNTER — Other Ambulatory Visit: Payer: Self-pay

## 2019-08-08 ENCOUNTER — Ambulatory Visit
Admission: RE | Admit: 2019-08-08 | Discharge: 2019-08-08 | Disposition: A | Discharge status: Home / Self Care | Payer: Medicare Other | Source: Ambulatory Visit | Attending: Radiation Oncology | Admitting: Radiation Oncology

## 2019-08-08 DIAGNOSIS — C61 Malignant neoplasm of prostate: Secondary | ICD-10-CM | POA: Diagnosis not present

## 2019-08-08 DIAGNOSIS — Z510 Encounter for antineoplastic radiation therapy: Secondary | ICD-10-CM | POA: Diagnosis not present

## 2019-08-09 ENCOUNTER — Ambulatory Visit
Admission: RE | Admit: 2019-08-09 | Discharge: 2019-08-09 | Disposition: A | Discharge status: Home / Self Care | Payer: Medicare Other | Source: Ambulatory Visit | Attending: Radiation Oncology | Admitting: Radiation Oncology

## 2019-08-09 ENCOUNTER — Other Ambulatory Visit: Payer: Self-pay

## 2019-08-09 DIAGNOSIS — Z510 Encounter for antineoplastic radiation therapy: Secondary | ICD-10-CM | POA: Diagnosis not present

## 2019-08-09 DIAGNOSIS — C61 Malignant neoplasm of prostate: Secondary | ICD-10-CM | POA: Diagnosis not present

## 2019-08-10 ENCOUNTER — Other Ambulatory Visit: Payer: Self-pay

## 2019-08-10 ENCOUNTER — Ambulatory Visit
Admission: RE | Admit: 2019-08-10 | Discharge: 2019-08-10 | Disposition: A | Discharge status: Home / Self Care | Payer: Medicare Other | Source: Ambulatory Visit | Attending: Radiation Oncology | Admitting: Radiation Oncology

## 2019-08-10 DIAGNOSIS — Z510 Encounter for antineoplastic radiation therapy: Secondary | ICD-10-CM | POA: Diagnosis not present

## 2019-08-10 DIAGNOSIS — C61 Malignant neoplasm of prostate: Secondary | ICD-10-CM | POA: Diagnosis not present

## 2019-08-11 ENCOUNTER — Other Ambulatory Visit: Payer: Self-pay

## 2019-08-11 ENCOUNTER — Ambulatory Visit
Admission: RE | Admit: 2019-08-11 | Discharge: 2019-08-11 | Disposition: A | Discharge status: Home / Self Care | Payer: Medicare Other | Source: Ambulatory Visit | Attending: Radiation Oncology | Admitting: Radiation Oncology

## 2019-08-11 DIAGNOSIS — Z510 Encounter for antineoplastic radiation therapy: Secondary | ICD-10-CM | POA: Diagnosis not present

## 2019-08-11 DIAGNOSIS — C61 Malignant neoplasm of prostate: Secondary | ICD-10-CM | POA: Diagnosis not present

## 2019-08-14 ENCOUNTER — Other Ambulatory Visit: Payer: Self-pay

## 2019-08-14 ENCOUNTER — Ambulatory Visit
Admission: RE | Admit: 2019-08-14 | Discharge: 2019-08-14 | Disposition: A | Discharge status: Home / Self Care | Payer: Medicare Other | Source: Ambulatory Visit | Attending: Radiation Oncology | Admitting: Radiation Oncology

## 2019-08-14 DIAGNOSIS — C61 Malignant neoplasm of prostate: Secondary | ICD-10-CM | POA: Diagnosis not present

## 2019-08-14 DIAGNOSIS — Z510 Encounter for antineoplastic radiation therapy: Secondary | ICD-10-CM | POA: Diagnosis not present

## 2019-08-15 ENCOUNTER — Ambulatory Visit
Admission: RE | Admit: 2019-08-15 | Discharge: 2019-08-15 | Disposition: A | Discharge status: Home / Self Care | Payer: Medicare Other | Source: Ambulatory Visit | Attending: Radiation Oncology | Admitting: Radiation Oncology

## 2019-08-15 ENCOUNTER — Other Ambulatory Visit: Payer: Self-pay

## 2019-08-15 DIAGNOSIS — C61 Malignant neoplasm of prostate: Secondary | ICD-10-CM | POA: Diagnosis not present

## 2019-08-15 DIAGNOSIS — Z510 Encounter for antineoplastic radiation therapy: Secondary | ICD-10-CM | POA: Diagnosis not present

## 2019-08-16 ENCOUNTER — Other Ambulatory Visit: Payer: Self-pay

## 2019-08-16 ENCOUNTER — Ambulatory Visit
Admission: RE | Admit: 2019-08-16 | Discharge: 2019-08-16 | Disposition: A | Discharge status: Home / Self Care | Payer: Medicare Other | Source: Ambulatory Visit | Attending: Radiation Oncology | Admitting: Radiation Oncology

## 2019-08-16 DIAGNOSIS — Z510 Encounter for antineoplastic radiation therapy: Secondary | ICD-10-CM | POA: Diagnosis not present

## 2019-08-16 DIAGNOSIS — C61 Malignant neoplasm of prostate: Secondary | ICD-10-CM | POA: Diagnosis not present

## 2019-08-17 ENCOUNTER — Other Ambulatory Visit: Payer: Self-pay

## 2019-08-17 ENCOUNTER — Ambulatory Visit
Admission: RE | Admit: 2019-08-17 | Discharge: 2019-08-17 | Disposition: A | Discharge status: Home / Self Care | Payer: Medicare Other | Source: Ambulatory Visit | Attending: Radiation Oncology | Admitting: Radiation Oncology

## 2019-08-17 DIAGNOSIS — Z510 Encounter for antineoplastic radiation therapy: Secondary | ICD-10-CM | POA: Diagnosis not present

## 2019-08-17 DIAGNOSIS — C61 Malignant neoplasm of prostate: Secondary | ICD-10-CM | POA: Diagnosis not present

## 2019-08-18 ENCOUNTER — Ambulatory Visit
Admission: RE | Admit: 2019-08-18 | Discharge: 2019-08-18 | Disposition: A | Discharge status: Home / Self Care | Payer: Medicare Other | Source: Ambulatory Visit | Attending: Radiation Oncology | Admitting: Radiation Oncology

## 2019-08-18 DIAGNOSIS — C61 Malignant neoplasm of prostate: Secondary | ICD-10-CM | POA: Diagnosis not present

## 2019-08-18 DIAGNOSIS — Z510 Encounter for antineoplastic radiation therapy: Secondary | ICD-10-CM | POA: Diagnosis not present

## 2019-08-22 ENCOUNTER — Ambulatory Visit
Admission: RE | Admit: 2019-08-22 | Discharge: 2019-08-22 | Disposition: A | Discharge status: Home / Self Care | Payer: Medicare Other | Source: Ambulatory Visit | Attending: Radiation Oncology | Admitting: Radiation Oncology

## 2019-08-22 ENCOUNTER — Other Ambulatory Visit: Payer: Self-pay

## 2019-08-22 DIAGNOSIS — C61 Malignant neoplasm of prostate: Secondary | ICD-10-CM | POA: Diagnosis not present

## 2019-08-22 DIAGNOSIS — Z510 Encounter for antineoplastic radiation therapy: Secondary | ICD-10-CM | POA: Diagnosis not present

## 2019-08-23 ENCOUNTER — Other Ambulatory Visit: Payer: Self-pay

## 2019-08-23 ENCOUNTER — Ambulatory Visit
Admission: RE | Admit: 2019-08-23 | Discharge: 2019-08-23 | Disposition: A | Discharge status: Home / Self Care | Payer: Medicare Other | Source: Ambulatory Visit | Attending: Radiation Oncology | Admitting: Radiation Oncology

## 2019-08-23 DIAGNOSIS — C61 Malignant neoplasm of prostate: Secondary | ICD-10-CM | POA: Diagnosis not present

## 2019-08-23 DIAGNOSIS — Z510 Encounter for antineoplastic radiation therapy: Secondary | ICD-10-CM | POA: Diagnosis not present

## 2019-08-24 ENCOUNTER — Ambulatory Visit
Admission: RE | Admit: 2019-08-24 | Discharge: 2019-08-24 | Disposition: A | Discharge status: Home / Self Care | Payer: Medicare Other | Source: Ambulatory Visit | Attending: Radiation Oncology | Admitting: Radiation Oncology

## 2019-08-24 ENCOUNTER — Other Ambulatory Visit: Payer: Self-pay

## 2019-08-24 DIAGNOSIS — C61 Malignant neoplasm of prostate: Secondary | ICD-10-CM | POA: Diagnosis not present

## 2019-08-24 DIAGNOSIS — Z510 Encounter for antineoplastic radiation therapy: Secondary | ICD-10-CM | POA: Diagnosis not present

## 2019-08-25 ENCOUNTER — Other Ambulatory Visit: Payer: Self-pay

## 2019-08-25 ENCOUNTER — Ambulatory Visit
Admission: RE | Admit: 2019-08-25 | Discharge: 2019-08-25 | Disposition: A | Discharge status: Home / Self Care | Payer: Medicare Other | Source: Ambulatory Visit | Attending: Radiation Oncology | Admitting: Radiation Oncology

## 2019-08-25 DIAGNOSIS — Z510 Encounter for antineoplastic radiation therapy: Secondary | ICD-10-CM | POA: Diagnosis not present

## 2019-08-25 DIAGNOSIS — C61 Malignant neoplasm of prostate: Secondary | ICD-10-CM | POA: Diagnosis not present

## 2019-08-28 ENCOUNTER — Other Ambulatory Visit: Payer: Self-pay

## 2019-08-28 ENCOUNTER — Ambulatory Visit
Admission: RE | Admit: 2019-08-28 | Discharge: 2019-08-28 | Disposition: A | Discharge status: Home / Self Care | Payer: Medicare Other | Source: Ambulatory Visit | Attending: Radiation Oncology | Admitting: Radiation Oncology

## 2019-08-28 DIAGNOSIS — Z510 Encounter for antineoplastic radiation therapy: Secondary | ICD-10-CM | POA: Diagnosis not present

## 2019-08-28 DIAGNOSIS — C61 Malignant neoplasm of prostate: Secondary | ICD-10-CM | POA: Diagnosis not present

## 2019-08-29 ENCOUNTER — Ambulatory Visit
Admission: RE | Admit: 2019-08-29 | Discharge: 2019-08-29 | Disposition: A | Discharge status: Home / Self Care | Payer: Medicare Other | Source: Ambulatory Visit | Attending: Radiation Oncology | Admitting: Radiation Oncology

## 2019-08-29 ENCOUNTER — Other Ambulatory Visit: Payer: Self-pay

## 2019-08-29 DIAGNOSIS — Z510 Encounter for antineoplastic radiation therapy: Secondary | ICD-10-CM | POA: Diagnosis not present

## 2019-08-29 DIAGNOSIS — C61 Malignant neoplasm of prostate: Secondary | ICD-10-CM | POA: Diagnosis not present

## 2019-08-30 ENCOUNTER — Ambulatory Visit
Admission: RE | Admit: 2019-08-30 | Discharge: 2019-08-30 | Disposition: A | Discharge status: Home / Self Care | Payer: Medicare Other | Source: Ambulatory Visit | Attending: Radiation Oncology | Admitting: Radiation Oncology

## 2019-08-30 ENCOUNTER — Other Ambulatory Visit: Payer: Self-pay

## 2019-08-30 DIAGNOSIS — Z510 Encounter for antineoplastic radiation therapy: Secondary | ICD-10-CM | POA: Diagnosis not present

## 2019-08-30 DIAGNOSIS — C61 Malignant neoplasm of prostate: Secondary | ICD-10-CM | POA: Diagnosis not present

## 2019-08-31 ENCOUNTER — Ambulatory Visit
Admission: RE | Admit: 2019-08-31 | Discharge: 2019-08-31 | Disposition: A | Discharge status: Home / Self Care | Payer: Medicare Other | Source: Ambulatory Visit | Attending: Radiation Oncology | Admitting: Radiation Oncology

## 2019-08-31 ENCOUNTER — Other Ambulatory Visit: Payer: Self-pay

## 2019-08-31 DIAGNOSIS — Z510 Encounter for antineoplastic radiation therapy: Secondary | ICD-10-CM | POA: Diagnosis not present

## 2019-08-31 DIAGNOSIS — C61 Malignant neoplasm of prostate: Secondary | ICD-10-CM | POA: Diagnosis not present

## 2019-09-01 ENCOUNTER — Ambulatory Visit
Admission: RE | Admit: 2019-09-01 | Discharge: 2019-09-01 | Disposition: A | Discharge status: Home / Self Care | Payer: Medicare Other | Source: Ambulatory Visit | Attending: Radiation Oncology | Admitting: Radiation Oncology

## 2019-09-01 ENCOUNTER — Other Ambulatory Visit: Payer: Self-pay

## 2019-09-01 DIAGNOSIS — C61 Malignant neoplasm of prostate: Secondary | ICD-10-CM | POA: Diagnosis not present

## 2019-09-01 DIAGNOSIS — Z510 Encounter for antineoplastic radiation therapy: Secondary | ICD-10-CM | POA: Diagnosis not present

## 2019-09-04 ENCOUNTER — Ambulatory Visit: Payer: Medicare Other

## 2019-09-04 ENCOUNTER — Ambulatory Visit
Admission: RE | Admit: 2019-09-04 | Discharge: 2019-09-04 | Disposition: A | Discharge status: Home / Self Care | Payer: Medicare Other | Source: Ambulatory Visit | Attending: Radiation Oncology | Admitting: Radiation Oncology

## 2019-09-04 DIAGNOSIS — Z510 Encounter for antineoplastic radiation therapy: Secondary | ICD-10-CM | POA: Diagnosis not present

## 2019-09-04 DIAGNOSIS — C61 Malignant neoplasm of prostate: Secondary | ICD-10-CM | POA: Diagnosis not present

## 2019-09-05 ENCOUNTER — Ambulatory Visit
Admission: RE | Admit: 2019-09-05 | Discharge: 2019-09-05 | Disposition: A | Discharge status: Home / Self Care | Payer: Medicare Other | Source: Ambulatory Visit | Attending: Radiation Oncology | Admitting: Radiation Oncology

## 2019-09-05 ENCOUNTER — Other Ambulatory Visit: Payer: Self-pay

## 2019-09-05 ENCOUNTER — Ambulatory Visit: Payer: Medicare Other

## 2019-09-05 DIAGNOSIS — Z510 Encounter for antineoplastic radiation therapy: Secondary | ICD-10-CM | POA: Diagnosis not present

## 2019-09-05 DIAGNOSIS — C61 Malignant neoplasm of prostate: Secondary | ICD-10-CM | POA: Diagnosis not present

## 2019-09-06 ENCOUNTER — Ambulatory Visit: Payer: Medicare Other

## 2019-09-06 ENCOUNTER — Ambulatory Visit
Admission: RE | Admit: 2019-09-06 | Discharge: 2019-09-06 | Disposition: A | Discharge status: Home / Self Care | Payer: Medicare Other | Source: Ambulatory Visit | Attending: Radiation Oncology | Admitting: Radiation Oncology

## 2019-09-06 ENCOUNTER — Other Ambulatory Visit: Payer: Self-pay

## 2019-09-06 DIAGNOSIS — C61 Malignant neoplasm of prostate: Secondary | ICD-10-CM | POA: Diagnosis not present

## 2019-09-06 DIAGNOSIS — Z510 Encounter for antineoplastic radiation therapy: Secondary | ICD-10-CM | POA: Diagnosis not present

## 2019-09-07 ENCOUNTER — Other Ambulatory Visit: Payer: Self-pay

## 2019-09-07 ENCOUNTER — Ambulatory Visit
Admission: RE | Admit: 2019-09-07 | Discharge: 2019-09-07 | Disposition: A | Discharge status: Home / Self Care | Payer: Medicare Other | Source: Ambulatory Visit | Attending: Radiation Oncology | Admitting: Radiation Oncology

## 2019-09-07 ENCOUNTER — Ambulatory Visit: Payer: Medicare Other

## 2019-09-07 DIAGNOSIS — C61 Malignant neoplasm of prostate: Secondary | ICD-10-CM | POA: Diagnosis not present

## 2019-09-07 DIAGNOSIS — Z510 Encounter for antineoplastic radiation therapy: Secondary | ICD-10-CM | POA: Diagnosis not present

## 2019-09-08 ENCOUNTER — Other Ambulatory Visit: Payer: Self-pay | Admitting: Radiation Oncology

## 2019-09-08 ENCOUNTER — Ambulatory Visit: Payer: Medicare Other

## 2019-09-08 ENCOUNTER — Other Ambulatory Visit: Payer: Self-pay

## 2019-09-08 ENCOUNTER — Encounter: Payer: Self-pay | Admitting: Radiation Oncology

## 2019-09-08 ENCOUNTER — Ambulatory Visit
Admission: RE | Admit: 2019-09-08 | Discharge: 2019-09-08 | Disposition: A | Discharge status: Home / Self Care | Payer: Medicare Other | Source: Ambulatory Visit | Attending: Radiation Oncology | Admitting: Radiation Oncology

## 2019-09-08 DIAGNOSIS — C61 Malignant neoplasm of prostate: Secondary | ICD-10-CM | POA: Diagnosis not present

## 2019-09-08 DIAGNOSIS — Z510 Encounter for antineoplastic radiation therapy: Secondary | ICD-10-CM | POA: Diagnosis not present

## 2019-09-25 DIAGNOSIS — M79675 Pain in left toe(s): Secondary | ICD-10-CM | POA: Diagnosis not present

## 2019-09-25 DIAGNOSIS — M79674 Pain in right toe(s): Secondary | ICD-10-CM | POA: Diagnosis not present

## 2019-09-25 DIAGNOSIS — B351 Tinea unguium: Secondary | ICD-10-CM | POA: Diagnosis not present

## 2019-10-13 ENCOUNTER — Encounter: Payer: Self-pay | Admitting: Medical Oncology

## 2019-10-13 DIAGNOSIS — N401 Enlarged prostate with lower urinary tract symptoms: Secondary | ICD-10-CM | POA: Diagnosis not present

## 2019-10-13 DIAGNOSIS — R3914 Feeling of incomplete bladder emptying: Secondary | ICD-10-CM | POA: Diagnosis not present

## 2019-10-13 DIAGNOSIS — C61 Malignant neoplasm of prostate: Secondary | ICD-10-CM | POA: Diagnosis not present

## 2019-10-19 ENCOUNTER — Telehealth: Payer: Self-pay | Admitting: *Deleted

## 2019-10-19 ENCOUNTER — Ambulatory Visit: Payer: Self-pay | Admitting: Urology

## 2019-10-19 NOTE — Telephone Encounter (Signed)
CALLED PATIENT TO ASK ABOUT MOVING TELEPHONE FU TO 10/25/19 @ 11 AM, LVM FOR A RETURN CALL

## 2019-10-24 ENCOUNTER — Telehealth: Payer: Self-pay

## 2019-10-24 ENCOUNTER — Other Ambulatory Visit: Payer: Self-pay

## 2019-10-24 ENCOUNTER — Encounter: Payer: Self-pay | Admitting: Urology

## 2019-10-24 NOTE — Telephone Encounter (Signed)
Spoke with patient in regards to telephone visit with Freeman Caldron PA on 10/25/19 at 11:00am. Patient verbalized understanding of appointment date and time. Meaningful use, AUA and prostate questions were reviewed. TM

## 2019-10-24 NOTE — Progress Notes (Signed)
Patient states nocturia 6-7 times per night. Patient denies dysuria or hematuria. Patient states having urgency. Patient states that he is not emptying his bladder completely and returning to the bathroom 1 1/2 hours later. Patient states having a weak urine stream at night. Patient denies pushing or straining during urination. Patient denies leakage of urine. Patient is currently on Flomax as directed. Patient has a follow-up appointment with [facility].

## 2019-10-25 ENCOUNTER — Ambulatory Visit
Admission: RE | Admit: 2019-10-25 | Discharge: 2019-10-25 | Disposition: A | Discharge status: Home / Self Care | Payer: Medicare Other | Source: Ambulatory Visit | Attending: Urology | Admitting: Urology

## 2019-10-25 ENCOUNTER — Other Ambulatory Visit: Payer: Self-pay

## 2019-10-25 DIAGNOSIS — C61 Malignant neoplasm of prostate: Secondary | ICD-10-CM

## 2019-10-25 NOTE — Progress Notes (Signed)
  Radiation Oncology         [phone]) [phone] ________________________________  Name: Marc Gilbert MRN: [id number]  Date: 09/08/2019  DOB: 03-07-1930  End of Treatment Note  Diagnosis:   86 y.o. gentleman with Stage T2a adenocarcinoma of the prostate with Gleason score of 3+4, and PSA of 18.5.     Indication for treatment:  Curative, Definitive Radiotherapy       Radiation treatment dates:   08/01/19 - 09/08/19, concurrent with ADT (19-month Eligard started 06/09/2019)  Site/dose:   The prostate was treated to 70 Gy in 28 fractions of 2.5 Gy  Beams/energy:   The patient was treated with IMRT using volumetric arc therapy delivering 6 MV X-rays to clockwise and counterclockwise circumferential arcs with a 90 degree collimator offset to avoid dose scalloping.  Image guidance was performed with daily cone beam CT prior to each fraction to align to gold markers in the prostate and assure proper bladder and rectal fill volumes.  Immobilization was achieved with BodyFix custom mold.  Narrative: The patient tolerated radiation treatment relatively well with only minor urinary irritation and modest fatigue. He did experience significant nocturia, 6-8 times per night despite taking Flomax twice daily. He also reported a weak stream and mild dysuria. He denied abdominal pain but did notice some loose stools. He continued to tolerate ADT well throughout his radiation.  Plan: The patient has completed radiation treatment. He will return to radiation oncology clinic for routine followup in one month. I advised him to call or return sooner if he has any questions or concerns related to his recovery or treatment. ________________________________  Sheral Apley. Tammi Klippel, M.D.

## 2019-10-25 NOTE — Progress Notes (Signed)
Radiation Oncology         [phone] ________________________________  Name: Marc Gilbert MRN: [id number]  Date: 10/25/2019  DOB: [birth year]  Post Treatment Note  CC: Lajean Manes, MD  Franchot Gallo, MD  Diagnosis:    86 y.o.gentleman with Stage T2aadenocarcinoma of the prostate with Gleason score of 3+4, and PSA of18.5.     Interval Since Last Radiation:  6.5 weeks  08/01/19 - 09/08/19, concurrent with ADT (33-month Eligard started 06/09/2019):  The prostate was treated to 70 Gy in 28 fractions of 2.5 Gy  Narrative:  I spoke with the patient to conduct his routine scheduled 1 month follow up visit via telephone to spare the patient unnecessary potential exposure in the healthcare setting during the current COVID-19 pandemic.  The patient was notified in advance and gave permission to proceed with this visit format.    He tolerated radiation treatment relatively well with only minor urinary irritation and modest fatigue. He did experience significant nocturia, 6-8 times per night despite taking Flomax twice daily. He also reported a weak stream and mild dysuria. He denied abdominal pain but did notice some loose stools. He continued to tolerate ADT well throughout his radiation.                              On review of systems, the patient states that he is doing relatively well in general.  He continues with moderate LUTS, particularly nocturia 6-7 times per night as well as urgency, frequency, weak stream at night and feelings of incomplete bladder emptying.  His current IPSS score is 25, indicating moderate to severe urinary symptoms but reports that these are gradually improving.  He specifically denies dysuria, gross hematuria, straining to void or incontinence.  He has continued taking Flomax twice daily as prescribed. He denies abdominal pain, nausea, vomiting, diarrhea or constipation.  He reports a healthy appetite and is maintaining his weight.  He continues with mild to  moderate fatigue and hot flashes associated with his ADT but in general feels that he is tolerating this treatment well.  He saw Dr. Diona Fanti on 10/13/2019 and got a 54-month Eligard injection at that time.  PSA has responded quite nicely, down to 0.45 most recently. Overall, he is pleased with his progress to date.  ALLERGIES:  is allergic to no known allergies.  Meds: Current Outpatient Medications  Medication Sig Dispense Refill  . acetaminophen (TYLENOL) 500 MG tablet Take 500-1,000 mg by mouth every 6 (six) hours as needed for mild pain or moderate pain.    Marland Kitchen aspirin 81 MG tablet Take 81 mg by mouth daily.    Marland Kitchen atorvastatin (LIPITOR) 40 MG tablet Take 1 tablet (40 mg total) by mouth daily. 90 tablet 3  . diclofenac Sodium (VOLTAREN) 1 % GEL Apply topically 4 (four) times daily.    . metoprolol succinate (TOPROL-XL) 50 MG 24 hr tablet TAKE 1 TABLET 2 TIMES DAILY. TAKE WITH OR IMMEDIATELY FOLLOWING A MEAL. 180 tablet 3  . Multiple Vitamins-Minerals (ONE-A-DAY MENS 50+ ADVANTAGE) TABS Take 1 tablet by mouth daily with breakfast.    . Omega-3 Fatty Acids (FISH OIL OMEGA-3 PO) Take 1,400 Units by mouth daily.    . psyllium (METAMUCIL) 58.6 % packet Take 1 packet by mouth 2 (two) times daily.     . Tamsulosin HCl (FLOMAX) 0.4 MG CAPS Take 0.8 mg by mouth daily. Reports taking one tablet at night then one  in the morning.     No current facility-administered medications for this encounter.    Physical Findings:  vitals were not taken for this visit.   /Unable to assess due to telephone follow-up visit format.  Lab Findings: Lab Results  Component Value Date   WBC 10.0 08/16/2016   HGB 11.3 (L) 08/16/2016   HCT 35.5 (L) 08/16/2016   MCV 93.9 08/16/2016   PLT 137 (L) 08/16/2016     Radiographic Findings: No results found.  Impression/Plan: 1.  86 y.o.gentleman with Stage T2aadenocarcinoma of the prostate with Gleason score of 3+4, and PSA of18.5.   He will continue to follow up  with urology for ongoing PSA determinations and has an appointment scheduled with Dr. Diona Fanti on 02/06/2020.  At the time of his most recent visit with Dr. Diona Fanti on 10/13/2019, his PSA had decreased to 0.45 and he received another 93-month Eligard injection to complete the planned 6 month course of therapy.  He understands what to expect with regards to PSA monitoring going forward. I will look forward to following his response to treatment via correspondence with urology, and would be happy to continue to participate in his care if clinically indicated. I talked to the patient about what to expect in the future, including his risk for erectile dysfunction and rectal bleeding. I encouraged him to call or return to the office if he has any questions regarding his previous radiation or possible radiation side effects. He was comfortable with this plan and will follow up as needed.     Nicholos Johns, PA-C

## 2019-10-26 ENCOUNTER — Ambulatory Visit: Payer: Medicare Other | Admitting: Urology

## 2019-11-27 DIAGNOSIS — M79674 Pain in right toe(s): Secondary | ICD-10-CM | POA: Diagnosis not present

## 2019-11-27 DIAGNOSIS — M79675 Pain in left toe(s): Secondary | ICD-10-CM | POA: Diagnosis not present

## 2019-11-27 DIAGNOSIS — B351 Tinea unguium: Secondary | ICD-10-CM | POA: Diagnosis not present

## 2019-12-12 DIAGNOSIS — Z23 Encounter for immunization: Secondary | ICD-10-CM | POA: Diagnosis not present

## 2019-12-12 DIAGNOSIS — L723 Sebaceous cyst: Secondary | ICD-10-CM | POA: Diagnosis not present

## 2019-12-27 DIAGNOSIS — D225 Melanocytic nevi of trunk: Secondary | ICD-10-CM | POA: Diagnosis not present

## 2019-12-27 DIAGNOSIS — C44629 Squamous cell carcinoma of skin of left upper limb, including shoulder: Secondary | ICD-10-CM | POA: Diagnosis not present

## 2019-12-27 DIAGNOSIS — C61 Malignant neoplasm of prostate: Secondary | ICD-10-CM | POA: Diagnosis not present

## 2019-12-27 DIAGNOSIS — G8929 Other chronic pain: Secondary | ICD-10-CM | POA: Diagnosis not present

## 2019-12-27 DIAGNOSIS — Z85828 Personal history of other malignant neoplasm of skin: Secondary | ICD-10-CM | POA: Diagnosis not present

## 2019-12-27 DIAGNOSIS — J449 Chronic obstructive pulmonary disease, unspecified: Secondary | ICD-10-CM | POA: Diagnosis not present

## 2019-12-27 DIAGNOSIS — L578 Other skin changes due to chronic exposure to nonionizing radiation: Secondary | ICD-10-CM | POA: Diagnosis not present

## 2019-12-27 DIAGNOSIS — L570 Actinic keratosis: Secondary | ICD-10-CM | POA: Diagnosis not present

## 2019-12-27 DIAGNOSIS — M25511 Pain in right shoulder: Secondary | ICD-10-CM | POA: Diagnosis not present

## 2019-12-27 DIAGNOSIS — I129 Hypertensive chronic kidney disease with stage 1 through stage 4 chronic kidney disease, or unspecified chronic kidney disease: Secondary | ICD-10-CM | POA: Diagnosis not present

## 2019-12-27 DIAGNOSIS — N1831 Chronic kidney disease, stage 3a: Secondary | ICD-10-CM | POA: Diagnosis not present

## 2019-12-27 DIAGNOSIS — M545 Low back pain, unspecified: Secondary | ICD-10-CM | POA: Diagnosis not present

## 2019-12-27 DIAGNOSIS — D485 Neoplasm of uncertain behavior of skin: Secondary | ICD-10-CM | POA: Diagnosis not present

## 2019-12-27 DIAGNOSIS — L723 Sebaceous cyst: Secondary | ICD-10-CM | POA: Diagnosis not present

## 2019-12-27 DIAGNOSIS — Z86018 Personal history of other benign neoplasm: Secondary | ICD-10-CM | POA: Diagnosis not present

## 2019-12-27 DIAGNOSIS — L821 Other seborrheic keratosis: Secondary | ICD-10-CM | POA: Diagnosis not present

## 2020-01-04 NOTE — Progress Notes (Signed)
Cardiology Office Note   Date:  01/05/2020   ID:  SELDEN NOTEBOOM, DOB Jun 19, 1930, MRN [id number]  PCP:  Lajean Manes, MD  Cardiologist:   Minus Breeding, MD    Chief Complaint  Patient presents with  . Extremity Weakness      History of Present Illness: Marc Gilbert is a 86 y.o. male who presents for follow up of CAD/CABG.    He had CABG 08/14/2016 w/ LIMA-LAD and SVG-OM.   Since I last saw him he is being treated for prostate cancer.    He has had some urinary incontinence frequent urination with this.  He is also had leg weakness which started rather recently and suddenly.  He is not describing pain.  He has had no cardiovascular complaints such as chest discomfort, neck or arm discomfort.  He has had no new shortness of breath, PND or orthopnea.  He has had no palpitations, presyncope or syncope.   Past Medical History:  Diagnosis Date  . Arthritis    "right hand; back" (08/30/2015)  . Cancer (HCC)    skin  squamous and basal  . Chronic lower back pain   . CKD (chronic kidney disease), stage III ([place])   . COPD (chronic obstructive pulmonary disease) (HCC)    no home O2  . Dysplastic colon polyp age 32   carcinoma in situ  . GERD (gastroesophageal reflux disease)    occ  . H/O hiatal hernia   . Hyperlipidemia   . Hypertension   . Prostate cancer ([place]) 07/2014   active surveillance, [place] 6    Past Surgical History:  Procedure Laterality Date  . CATARACT EXTRACTION W/ INTRAOCULAR LENS IMPLANT Left 08/27/15  . CORONARY ARTERY BYPASS GRAFT N/A 08/14/2016   Procedure: CORONARY ARTERY BYPASS GRAFTING (CABG)x2, using left internal mammary artery and right greater saphenous vein harvested endoscopically;  Surgeon: Gaye Pollack, MD;  Location: [facility];  Service: Open Heart Surgery;  Laterality: N/A;  . JOINT REPLACEMENT    . LEFT HEART CATH AND CORONARY ANGIOGRAPHY N/A 08/11/2016   Procedure: Left Heart Cath and Coronary Angiography;  Surgeon: Belva Crome, MD;   Location: [facility];  Service: Cardiovascular;  Laterality: N/A;  . MOHS SURGERY     multiple SCC  . PROSTATE BIOPSY  07/2014  . TEE WITHOUT CARDIOVERSION N/A 08/14/2016   Procedure: TRANSESOPHAGEAL ECHOCARDIOGRAM (TEE);  Surgeon: Gaye Pollack, MD;  Location: [facility];  Service: Open Heart Surgery;  Laterality: N/A;  . TONSILLECTOMY    . TOTAL HIP ARTHROPLASTY  05/26/2011   Procedure: TOTAL HIP ARTHROPLASTY;  Surgeon: Garald Balding, MD;  Location: [facility];  Service: Orthopedics;  Laterality: Right;  . ULTRASOUND GUIDANCE FOR VASCULAR ACCESS  08/11/2016   Procedure: Ultrasound Guidance For Vascular Access;  Surgeon: Belva Crome, MD;  Location: [facility];  Service: Cardiovascular;;     Current Outpatient Medications  Medication Sig Dispense Refill  . acetaminophen (TYLENOL) 500 MG tablet Take 500-1,000 mg by mouth every 6 (six) hours as needed for mild pain or moderate pain.    Marland Kitchen amoxicillin (AMOXIL) 500 MG tablet Take 1,000 mg by mouth 2 (two) times daily. Take prior to dental visits.    Marland Kitchen aspirin 81 MG tablet Take 81 mg by mouth daily.    Marland Kitchen atorvastatin (LIPITOR) 40 MG tablet Take 1 tablet (40 mg total) by mouth daily. 90 tablet 3  . diclofenac Sodium (VOLTAREN) 1 % GEL Apply topically  4 (four) times daily.    Marland Kitchen doxycycline (VIBRA-TABS) 100 MG tablet Take 100 mg by mouth 2 (two) times daily.    . metoprolol succinate (TOPROL-XL) 50 MG 24 hr tablet TAKE 1 TABLET 2 TIMES DAILY. TAKE WITH OR IMMEDIATELY FOLLOWING A MEAL. 180 tablet 3  . Multiple Vitamins-Minerals (ONE-A-DAY MENS 50+ ADVANTAGE) TABS Take 1 tablet by mouth daily with breakfast.    . Omega-3 Fatty Acids (FISH OIL OMEGA-3 PO) Take 1,400 Units by mouth daily.    . psyllium (METAMUCIL) 58.6 % packet Take 1 packet by mouth 2 (two) times daily.     . Tamsulosin HCl (FLOMAX) 0.4 MG CAPS Take 0.8 mg by mouth daily. Reports taking one tablet at night then one in the morning.     No current facility-administered  medications for this visit.    Allergies:   No known allergies    ROS:  Please see the history of present illness.   Otherwise, review of systems are positive for none.   All other systems are reviewed and negative.    PHYSICAL EXAM: VS:  BP 110/60   Pulse 76   Ht 6' (1.829 m)   Wt 208 lb 6.4 oz (94.5 kg)   BMI 28.26 kg/m  , BMI Body mass index is 28.26 kg/m. GENERAL:  Well appearing NECK:  No jugular venous distention, waveform within normal limits, carotid upstroke brisk and symmetric, no bruits, no thyromegaly LUNGS:  Clear to auscultation bilaterally CHEST:  Well healed sternotomy scar. HEART:  PMI not displaced or sustained,S1 and S2 within normal limits, no S3, no S4, no clicks, no rubs, no murmurs ABD:  Flat, positive bowel sounds normal in frequency in pitch, no bruits, no rebound, no guarding, no midline pulsatile mass, no hepatomegaly, no splenomegaly EXT:  2 plus pulses throughout, no edema, no cyanosis no clubbing  EKG:  EKG is  ordered today. Sinus rhythm, rate 76, RSR prime V1 and V2, nonspecific T wave changes.  No change from previous.   Recent Labs: No results found for requested labs within last 8760 hours.   Wt Readings from Last 3 Encounters:  01/05/20 208 lb 6.4 oz (94.5 kg)  04/25/19 205 lb (93 kg)  03/09/19 213 lb (96.6 kg)      Other studies Reviewed: Additional studies/ records that were reviewed today include: Labs  Review of the above records demonstrates: See elsewhere   ASSESSMENT AND PLAN:   CAD:  The patient has no new sypmtoms.  No further cardiovascular testing is indicated.  We will continue with aggressive risk reduction and meds as listed.  HTN:  The blood pressure is at target.  No change in therapy.   DYSLIPIDEMIA:    LDL was excellent with a level of 58 and HDL of 55.  However, because of his leg weakness I would like him to give himself a 6-week Lipitor holiday.  If he has no change in his muscle weakness he can go back on the  Lipitor.  If his muscle weakness improves I would like him to go back on the Lipitor to see if that weakness comes back.  He understands this plan and agrees and will let me know how it goes.    Current medicines are reviewed at length with the patient today.  The patient does not have concerns regarding medicines.  The following changes have been made: As above  Labs/ tests ordered today include:  None   Orders Placed This Encounter  Procedures  .  EKG 12-Lead     Disposition:   FU with me in 12 months.     Signed, Minus Breeding, MD  01/05/2020 3:29 PM    [facility]

## 2020-01-05 ENCOUNTER — Encounter: Payer: Self-pay | Admitting: Cardiology

## 2020-01-05 ENCOUNTER — Ambulatory Visit (INDEPENDENT_AMBULATORY_CARE_PROVIDER_SITE_OTHER): Payer: Medicare Other | Admitting: Cardiology

## 2020-01-05 VITALS — BP 110/60 | HR 76 | Ht 72.0 in | Wt 208.4 lb

## 2020-01-05 DIAGNOSIS — I2510 Atherosclerotic heart disease of native coronary artery without angina pectoris: Secondary | ICD-10-CM

## 2020-01-05 DIAGNOSIS — E785 Hyperlipidemia, unspecified: Secondary | ICD-10-CM

## 2020-01-05 DIAGNOSIS — I10 Essential (primary) hypertension: Secondary | ICD-10-CM | POA: Diagnosis not present

## 2020-01-05 MED ORDER — METOPROLOL SUCCINATE ER 50 MG PO TB24: ORAL_TABLET | ORAL | 3 refills | Status: DC

## 2020-01-05 MED ORDER — ATORVASTATIN CALCIUM 40 MG PO TABS: 40.0000 mg | ORAL_TABLET | Freq: Every day | ORAL | 3 refills | Status: DC

## 2020-01-05 NOTE — Addendum Note (Signed)
Addended by: Sherrie Mustache on: 01/05/2020 03:41 PM   Modules accepted: Orders

## 2020-01-05 NOTE — Patient Instructions (Signed)
Medication Instructions:  6 week Lipitor holiday *If you need a refill on your cardiac medications before your next appointment, please call your pharmacy*  Lab Work: None ordered this visit  Testing/Procedures: None ordered this visit  Follow-Up: At [facility], you and your health needs are our priority.  As part of our continuing mission to provide you with exceptional heart care, we have created designated Provider Care Teams.  These Care Teams include your primary Cardiologist (physician) and Advanced Practice Providers (APPs -  Physician Assistants and Nurse Practitioners) who all work together to provide you with the care you need, when you need it.  Your next appointment:   12 month(s)  The format for your next appointment:   In Person  Provider:   Minus Breeding, MD

## 2020-01-10 ENCOUNTER — Other Ambulatory Visit: Payer: Self-pay | Admitting: Orthopedic Surgery

## 2020-01-10 DIAGNOSIS — M25511 Pain in right shoulder: Secondary | ICD-10-CM

## 2020-01-10 DIAGNOSIS — M19011 Primary osteoarthritis, right shoulder: Secondary | ICD-10-CM | POA: Diagnosis not present

## 2020-01-29 DIAGNOSIS — M79674 Pain in right toe(s): Secondary | ICD-10-CM | POA: Diagnosis not present

## 2020-01-29 DIAGNOSIS — B351 Tinea unguium: Secondary | ICD-10-CM | POA: Diagnosis not present

## 2020-01-29 DIAGNOSIS — M79675 Pain in left toe(s): Secondary | ICD-10-CM | POA: Diagnosis not present

## 2020-01-31 ENCOUNTER — Ambulatory Visit
Admission: RE | Admit: 2020-01-31 | Discharge: 2020-01-31 | Disposition: A | Discharge status: Home / Self Care | Payer: Medicare Other | Source: Ambulatory Visit | Attending: Orthopedic Surgery | Admitting: Orthopedic Surgery

## 2020-01-31 DIAGNOSIS — M19011 Primary osteoarthritis, right shoulder: Secondary | ICD-10-CM | POA: Diagnosis not present

## 2020-01-31 DIAGNOSIS — M25411 Effusion, right shoulder: Secondary | ICD-10-CM | POA: Diagnosis not present

## 2020-01-31 DIAGNOSIS — Z951 Presence of aortocoronary bypass graft: Secondary | ICD-10-CM | POA: Diagnosis not present

## 2020-01-31 DIAGNOSIS — M25511 Pain in right shoulder: Secondary | ICD-10-CM

## 2020-02-06 DIAGNOSIS — C61 Malignant neoplasm of prostate: Secondary | ICD-10-CM | POA: Diagnosis not present

## 2020-02-06 DIAGNOSIS — C44629 Squamous cell carcinoma of skin of left upper limb, including shoulder: Secondary | ICD-10-CM | POA: Diagnosis not present

## 2020-02-06 DIAGNOSIS — L988 Other specified disorders of the skin and subcutaneous tissue: Secondary | ICD-10-CM | POA: Diagnosis not present

## 2020-02-19 ENCOUNTER — Telehealth: Payer: Self-pay

## 2020-02-19 NOTE — Telephone Encounter (Signed)
Called Dr. Laverle Hobby office and left a voicemail for [place] to send patient's most recent lab work to fax [phone].

## 2020-02-21 DIAGNOSIS — C61 Malignant neoplasm of prostate: Secondary | ICD-10-CM | POA: Diagnosis not present

## 2020-02-21 DIAGNOSIS — N401 Enlarged prostate with lower urinary tract symptoms: Secondary | ICD-10-CM | POA: Diagnosis not present

## 2020-02-21 DIAGNOSIS — R351 Nocturia: Secondary | ICD-10-CM | POA: Diagnosis not present

## 2020-02-21 DIAGNOSIS — R3914 Feeling of incomplete bladder emptying: Secondary | ICD-10-CM | POA: Diagnosis not present

## 2020-03-25 DIAGNOSIS — N401 Enlarged prostate with lower urinary tract symptoms: Secondary | ICD-10-CM | POA: Diagnosis not present

## 2020-03-25 DIAGNOSIS — C61 Malignant neoplasm of prostate: Secondary | ICD-10-CM | POA: Diagnosis not present

## 2020-03-25 DIAGNOSIS — R351 Nocturia: Secondary | ICD-10-CM | POA: Diagnosis not present

## 2020-04-01 DIAGNOSIS — H52203 Unspecified astigmatism, bilateral: Secondary | ICD-10-CM | POA: Diagnosis not present

## 2020-04-01 DIAGNOSIS — H35372 Puckering of macula, left eye: Secondary | ICD-10-CM | POA: Diagnosis not present

## 2020-04-01 DIAGNOSIS — H524 Presbyopia: Secondary | ICD-10-CM | POA: Diagnosis not present

## 2020-04-10 DIAGNOSIS — M79674 Pain in right toe(s): Secondary | ICD-10-CM | POA: Diagnosis not present

## 2020-04-10 DIAGNOSIS — B351 Tinea unguium: Secondary | ICD-10-CM | POA: Diagnosis not present

## 2020-04-10 DIAGNOSIS — M79675 Pain in left toe(s): Secondary | ICD-10-CM | POA: Diagnosis not present

## 2020-04-16 ENCOUNTER — Telehealth: Payer: Self-pay | Admitting: *Deleted

## 2020-04-16 NOTE — Telephone Encounter (Signed)
   [facility] Pre-operative Risk Assessment    HEARTCARE STAFF: - Please ensure there is not already an duplicate clearance open for this procedure. - Under Visit Info/Reason for Call, type in Other and utilize the format Clearance MM/DD/YY or Clearance TBD. Do not use dashes or single digits. - If request is for dental extraction, please clarify the # of teeth to be extracted.  Request for surgical clearance:  1. What type of surgery is being performed? Right total shoulder replacement   2. When is this surgery scheduled? TBD   3. What type of clearance is required (medical clearance vs. Pharmacy clearance to hold med vs. Both)? medical  4. Are there any medications that need to be held prior to surgery and how long?none   5. Practice name and name of physician performing surgery? [facility]   6. What is the office phone number? [phone]   7.   What is the office fax number? [phone]  8.   Anesthesia type (None, local, MAC, general) ? Not listed   Fredia Beets 04/16/2020, 10:48 AM  _________________________________________________________________   (provider comments below)

## 2020-04-17 NOTE — Telephone Encounter (Signed)
   Primary Cardiologist: Dr. Minus Breeding, MD   Chart reviewed as part of pre-operative protocol coverage. Given past medical history and time since last visit, based on ACC/AHA guidelines, Mylen L Nitsch would be at acceptable risk for the planned procedure without further cardiovascular testing.   I personally spoke with Mr. Marc Gilbert today. He reports he has been doing very well from a CV standpoint with no chest pain or other anginal symptoms. He is mainly limited by shoulder pain at this time.   The patient was advised that if he develops new symptoms prior to surgery to contact our office to arrange for a follow-up visit, and he verbalized understanding.  I will route this recommendation to the requesting party via Epic fax function and remove from pre-op pool.  Please call with questions.  Kathyrn Drown, NP 04/17/2020, 3:22 PM

## 2020-04-23 DIAGNOSIS — K5901 Slow transit constipation: Secondary | ICD-10-CM | POA: Diagnosis not present

## 2020-04-23 DIAGNOSIS — K644 Residual hemorrhoidal skin tags: Secondary | ICD-10-CM | POA: Diagnosis not present

## 2020-05-16 DIAGNOSIS — M19011 Primary osteoarthritis, right shoulder: Secondary | ICD-10-CM | POA: Diagnosis present

## 2020-05-16 NOTE — Progress Notes (Signed)
Sent message, via epic in basket, requesting orders in epic from surgeon.  

## 2020-05-20 DIAGNOSIS — M19011 Primary osteoarthritis, right shoulder: Secondary | ICD-10-CM | POA: Diagnosis not present

## 2020-05-23 ENCOUNTER — Inpatient Hospital Stay (HOSPITAL_COMMUNITY): Admission: RE | Admit: 2020-05-23 | Payer: Medicare Other | Source: Ambulatory Visit

## 2020-06-04 DIAGNOSIS — M19011 Primary osteoarthritis, right shoulder: Secondary | ICD-10-CM | POA: Diagnosis present

## 2020-06-05 DIAGNOSIS — Z23 Encounter for immunization: Secondary | ICD-10-CM | POA: Diagnosis not present

## 2020-06-10 DIAGNOSIS — M79675 Pain in left toe(s): Secondary | ICD-10-CM | POA: Diagnosis not present

## 2020-06-10 DIAGNOSIS — B351 Tinea unguium: Secondary | ICD-10-CM | POA: Diagnosis not present

## 2020-06-10 DIAGNOSIS — M79674 Pain in right toe(s): Secondary | ICD-10-CM | POA: Diagnosis not present

## 2020-06-10 NOTE — Patient Instructions (Addendum)
DUE TO COVID-19 ONLY ONE VISITOR IS ALLOWED TO COME WITH YOU AND STAY IN THE WAITING ROOM ONLY DURING PRE OP AND PROCEDURE DAY OF SURGERY. THE 1 VISITOR  MAY VISIT WITH YOU AFTER SURGERY IN YOUR PRIVATE ROOM DURING VISITING HOURS ONLY!  YOU NEED TO HAVE A COVID 19 TEST ON: 06/14/20 @ 2:00 PM , THIS TEST MUST BE DONE BEFORE SURGERY,  COVID TESTING SITE [place] [address], IT IS ON THE RIGHT GOING OUT [address] APPROXIMATELY  2 MINUTES PAST [employer] ON THE RIGHT. ONCE YOUR COVID TEST IS COMPLETED,  PLEASE BEGIN THE QUARANTINE INSTRUCTIONS AS OUTLINED IN YOUR HANDOUT.                Marc Gilbert    Your procedure is scheduled on: 06/18/20   Report to [facility] Main  Entrance   Report to short stay at: 5:15 AM     Call this number if you have problems the morning of surgery [phone]    Remember: NO SOLID FOOD AFTER MIDNIGHT THE NIGHT PRIOR TO SURGERY. NOTHING BY MOUTH EXCEPT CLEAR LIQUIDS UNTIL: 4:30 AM . PLEASE FINISH ENSURE DRINK PER SURGEON ORDER  WHICH NEEDS TO BE COMPLETED AT: 4:30 AM .  CLEAR LIQUID DIET  Foods Allowed                                                                     Foods Excluded  Coffee and tea, regular and decaf                             liquids that you cannot  Plain Jell-O any favor except red or purple                                           see through such as: Fruit ices (not with fruit pulp)                                     milk, soups, orange juice  Iced Popsicles                                    All solid food Carbonated beverages, regular and diet                                    Cranberry, grape and apple juices Sports drinks like Gatorade Lightly seasoned clear broth or consume(fat free) Sugar, honey syrup  Sample Menu Breakfast                                Lunch  Supper Cranberry juice                    Beef broth                             Chicken broth Jell-O                                     Grape juice                           Apple juice Coffee or tea                        Jell-O                                      Popsicle                                                Coffee or tea                        Coffee or tea  _____________________________________________________________________  BRUSH YOUR TEETH MORNING OF SURGERY AND RINSE YOUR MOUTH OUT, NO CHEWING GUM CANDY OR MINTS.    Take these medicines the morning of surgery with A SIP OF WATER: metoprolol,tamsulosin.                               You may not have any metal on your body including hair pins and              piercings  Do not wear jewelry, lotions, powders or perfumes, deodorant             Men may shave face and neck.   Do not bring valuables to the hospital. [place].  Contacts, dentures or bridgework may not be worn into surgery.  Leave suitcase in the car. After surgery it may be brought to your room.     Patients discharged the day of surgery will not be allowed to drive home. IF YOU ARE HAVING SURGERY AND GOING HOME THE SAME DAY, YOU MUST HAVE AN ADULT TO DRIVE YOU HOME AND BE WITH YOU FOR 24 HOURS. YOU MAY GO HOME BY TAXI OR UBER OR ORTHERWISE, BUT AN ADULT MUST ACCOMPANY YOU HOME AND STAY WITH YOU FOR 24 HOURS.  Name and phone number of your driver:  Special Instructions: N/A              Please read over the following fact sheets you were given: _____________________________________________________________________          [facility] - Preparing for Surgery Before surgery, you can play an important role.  Because skin is not sterile, your skin needs to be as free of germs as possible.  You can reduce the number of germs on your skin by washing with CHG (chlorahexidine gluconate)  soap before surgery.  CHG is an antiseptic cleaner which kills germs and bonds with the skin to continue  killing germs even after washing. Please DO NOT use if you have an allergy to CHG or antibacterial soaps.  If your skin becomes reddened/irritated stop using the CHG and inform your nurse when you arrive at Short Stay. Do not shave (including legs and underarms) for at least 48 hours prior to the first CHG shower.  You may shave your face/neck. Please follow these instructions carefully:  1.  Shower with CHG Soap the night before surgery and the  morning of Surgery.  2.  If you choose to wash your hair, wash your hair first as usual with your  normal  shampoo.  3.  After you shampoo, rinse your hair and body thoroughly to remove the  shampoo.                           4.  Use CHG as you would any other liquid soap.  You can apply chg directly  to the skin and wash                       Gently with a scrungie or clean washcloth.  5.  Apply the CHG Soap to your body ONLY FROM THE NECK DOWN.   Do not use on face/ open                           Wound or open sores. Avoid contact with eyes, ears mouth and genitals (private parts).                       Wash face,  Genitals (private parts) with your normal soap.             6.  Wash thoroughly, paying special attention to the area where your surgery  will be performed.  7.  Thoroughly rinse your body with warm water from the neck down.  8.  DO NOT shower/wash with your normal soap after using and rinsing off  the CHG Soap.                9.  Pat yourself dry with a clean towel.            10.  Wear clean pajamas.            11.  Place clean sheets on your bed the night of your first shower and do not  sleep with pets. Day of Surgery : Do not apply any lotions/deodorants the morning of surgery.  Please wear clean clothes to the hospital/surgery center.  FAILURE TO FOLLOW THESE INSTRUCTIONS MAY RESULT IN THE CANCELLATION OF YOUR SURGERY PATIENT SIGNATURE_________________________________  NURSE  SIGNATURE__________________________________  ________________________________________________________________________   Marc Gilbert  An incentive spirometer is a tool that can help keep your lungs clear and active. This tool measures how well you are filling your lungs with each breath. Taking long deep breaths may help reverse or decrease the chance of developing breathing (pulmonary) problems (especially infection) following:  A long period of time when you are unable to move or be active. BEFORE THE PROCEDURE   If the spirometer includes an indicator to show your best effort, your nurse or respiratory therapist will set it to a desired goal.  If possible, sit up straight or lean slightly forward.  Try not to slouch.  Hold the incentive spirometer in an upright position. INSTRUCTIONS FOR USE  1. Sit on the edge of your bed if possible, or sit up as far as you can in bed or on a chair. 2. Hold the incentive spirometer in an upright position. 3. Breathe out normally. 4. Place the mouthpiece in your mouth and seal your lips tightly around it. 5. Breathe in slowly and as deeply as possible, raising the piston or the ball toward the top of the column. 6. Hold your breath for 3-5 seconds or for as long as possible. Allow the piston or ball to fall to the bottom of the column. 7. Remove the mouthpiece from your mouth and breathe out normally. 8. Rest for a few seconds and repeat Steps 1 through 7 at least 10 times every 1-2 hours when you are awake. Take your time and take a few normal breaths between deep breaths. 9. The spirometer may include an indicator to show your best effort. Use the indicator as a goal to work toward during each repetition. 10. After each set of 10 deep breaths, practice coughing to be sure your lungs are clear. If you have an incision (the cut made at the time of surgery), support your incision when coughing by placing a pillow or rolled up towels firmly  against it. Once you are able to get out of bed, walk around indoors and cough well. You may stop using the incentive spirometer when instructed by your caregiver.  RISKS AND COMPLICATIONS  Take your time so you do not get dizzy or light-headed.  If you are in pain, you may need to take or ask for pain medication before doing incentive spirometry. It is harder to take a deep breath if you are having pain. AFTER USE  Rest and breathe slowly and easily.  It can be helpful to keep track of a log of your progress. Your caregiver can provide you with a simple table to help with this. If you are using the spirometer at home, follow these instructions: [place] IF:   You are having difficultly using the spirometer.  You have trouble using the spirometer as often as instructed.  Your pain medication is not giving enough relief while using the spirometer.  You develop fever of 100.5 F (38.1 C) or higher. SEEK IMMEDIATE MEDICAL CARE IF:   You cough up bloody sputum that had not been present before.  You develop fever of 102 F (38.9 C) or greater.  You develop worsening pain at or near the incision site. MAKE SURE YOU:   Understand these instructions.  Will watch your condition.  Will get help right away if you are not doing well or get worse. Document Released: 06/15/2006 Document Revised: 04/27/2011 Document Reviewed: 08/16/2006 ExitCare Patient Information 2014 [place].   ________________________________________________________________________  [facility]- Preparing for Total Shoulder Arthroplasty    Before surgery, you can play an important role. Because skin is not sterile, your skin needs to be as free of germs as possible. You can reduce the number of germs on your skin by using the following products. . Benzoyl Peroxide Gel o Reduces the number of germs present on the skin o Applied twice a day to shoulder area starting two days before surgery     ==================================================================  Please follow these instructions carefully:  BENZOYL PEROXIDE 5% GEL  Please do not use if you have an allergy to benzoyl peroxide.   If your skin  becomes reddened/irritated stop using the benzoyl peroxide.  Starting two days before surgery, apply as follows: 1. Apply benzoyl peroxide in the morning and at night. Apply after taking a shower. If you are not taking a shower clean entire shoulder front, back, and side along with the armpit with a clean wet washcloth.  2. Place a quarter-sized dollop on your shoulder and rub in thoroughly, making sure to cover the front, back, and side of your shoulder, along with the armpit.   2 days before ____ AM   ____ PM              1 day before ____ AM   ____ PM                         3. Do this twice a day for two days.  (Last application is the night before surgery, AFTER using the CHG soap as described below).  4. Do NOT apply benzoyl peroxide gel on the day of surgery.

## 2020-06-11 ENCOUNTER — Other Ambulatory Visit: Payer: Self-pay

## 2020-06-11 ENCOUNTER — Encounter (HOSPITAL_COMMUNITY)
Admission: RE | Admit: 2020-06-11 | Discharge: 2020-06-11 | Disposition: A | Discharge status: Home / Self Care | Payer: Medicare Other | Source: Ambulatory Visit | Attending: Orthopedic Surgery | Admitting: Orthopedic Surgery

## 2020-06-11 ENCOUNTER — Encounter (HOSPITAL_COMMUNITY): Payer: Self-pay

## 2020-06-11 DIAGNOSIS — Z951 Presence of aortocoronary bypass graft: Secondary | ICD-10-CM | POA: Insufficient documentation

## 2020-06-11 DIAGNOSIS — I2510 Atherosclerotic heart disease of native coronary artery without angina pectoris: Secondary | ICD-10-CM | POA: Diagnosis not present

## 2020-06-11 DIAGNOSIS — Z792 Long term (current) use of antibiotics: Secondary | ICD-10-CM | POA: Diagnosis not present

## 2020-06-11 DIAGNOSIS — N183 Chronic kidney disease, stage 3 unspecified: Secondary | ICD-10-CM | POA: Insufficient documentation

## 2020-06-11 DIAGNOSIS — Z79899 Other long term (current) drug therapy: Secondary | ICD-10-CM | POA: Insufficient documentation

## 2020-06-11 DIAGNOSIS — Z01812 Encounter for preprocedural laboratory examination: Secondary | ICD-10-CM | POA: Diagnosis not present

## 2020-06-11 DIAGNOSIS — K219 Gastro-esophageal reflux disease without esophagitis: Secondary | ICD-10-CM | POA: Diagnosis not present

## 2020-06-11 DIAGNOSIS — J449 Chronic obstructive pulmonary disease, unspecified: Secondary | ICD-10-CM | POA: Diagnosis not present

## 2020-06-11 DIAGNOSIS — M19011 Primary osteoarthritis, right shoulder: Secondary | ICD-10-CM | POA: Insufficient documentation

## 2020-06-11 DIAGNOSIS — Z7982 Long term (current) use of aspirin: Secondary | ICD-10-CM | POA: Insufficient documentation

## 2020-06-11 HISTORY — DX: Atherosclerotic heart disease of native coronary artery without angina pectoris: I25.10

## 2020-06-11 LAB — CBC
HCT: 43.6 % (ref 39.0–52.0)
Hemoglobin: 14.1 g/dL (ref 13.0–17.0)
MCH: 31.5 pg (ref 26.0–34.0)
MCHC: 32.3 g/dL (ref 30.0–36.0)
MCV: 97.3 fL (ref 80.0–100.0)
Platelets: 200 10*3/uL (ref 150–400)
RBC: 4.48 MIL/uL (ref 4.22–5.81)
RDW: 13.1 % (ref 11.5–15.5)
WBC: 6.2 10*3/uL (ref 4.0–10.5)
nRBC: 0 % (ref 0.0–0.2)

## 2020-06-11 LAB — SURGICAL PCR SCREEN
MRSA, PCR: NEGATIVE
Staphylococcus aureus: NEGATIVE

## 2020-06-11 LAB — BASIC METABOLIC PANEL
Anion gap: 10 (ref 5–15)
BUN: 28 mg/dL — ABNORMAL HIGH (ref 8–23)
CO2: 27 mmol/L (ref 22–32)
Calcium: 9.4 mg/dL (ref 8.9–10.3)
Chloride: 105 mmol/L (ref 98–111)
Creatinine, Ser: 1.25 mg/dL — ABNORMAL HIGH (ref 0.61–1.24)
GFR, Estimated: 56 mL/min — ABNORMAL LOW (ref >60)
Glucose, Bld: 102 mg/dL — ABNORMAL HIGH (ref 70–99)
Potassium: 4.7 mmol/L (ref 3.5–5.1)
Sodium: 142 mmol/L (ref 135–145)

## 2020-06-11 NOTE — Progress Notes (Signed)
COVID Vaccine Completed: Yes Date COVID Vaccine completed: 05/2020. 4th. dose COVID vaccine manufacturer:  Moderna     PCP - Dr. Domenic Schwab Cardiologist - Dr. Minus Breeding. LOV: 01/05/20. Clearance: Kathyrn Drown: NP: 04/16/20  Chest x-ray -  EKG - 01/05/20 Stress Test -  ECHO - 08/11/16 Cardiac Cath -  Pacemaker/ICD device last checked:  Sleep Study -  CPAP -   Fasting Blood Sugar -  Checks Blood Sugar _____ times a day  Blood Thinner Instructions: Aspirin Instructions: Last Dose:  Anesthesia review: Hx: HTN,COPD,CKD III,CABG.  Patient denies shortness of breath, fever, cough and chest pain at PAT appointment   Patient verbalized understanding of instructions that were given to them at the PAT appointment. Patient was also instructed that they will need to review over the PAT instructions again at home before surgery.

## 2020-06-12 NOTE — Anesthesia Preprocedure Evaluation (Addendum)
Anesthesia Evaluation    Airway Mallampati: II  TM Distance: >3 FB Neck ROM: Full    Dental no notable dental hx.    Pulmonary COPD, former smoker,    Pulmonary exam normal breath sounds clear to auscultation       Cardiovascular hypertension, Pt. on medications + CAD and + CABG  Normal cardiovascular exam Rhythm:Regular Rate:Normal     Neuro/Psych    GI/Hepatic GERD  ,  Endo/Other    Renal/GU Renal InsufficiencyRenal disease     Musculoskeletal  (+) Arthritis , Osteoarthritis,    Abdominal   Peds  Hematology   Anesthesia Other Findings   Reproductive/Obstetrics                            Anesthesia Physical Anesthesia Plan  ASA: III  Anesthesia Plan: General   Post-op Pain Management:  Regional for Post-op pain   Induction: Intravenous  PONV Risk Score and Plan: 2 and Ondansetron, Midazolam and Treatment may vary due to age or medical condition  Airway Management Planned: Oral ETT  Additional Equipment:   Intra-op Plan:   Post-operative Plan: Extubation in OR  Informed Consent: I have reviewed the patients History and Physical, chart, labs and discussed the procedure including the risks, benefits and alternatives for the proposed anesthesia with the patient or authorized representative who has indicated his/her understanding and acceptance.     Dental advisory given  Plan Discussed with: CRNA  Anesthesia Plan Comments: (See PAT note 06/11/2020, Konrad Felix, PA-C)       Anesthesia Quick Evaluation

## 2020-06-12 NOTE — Progress Notes (Signed)
Anesthesia Chart Review   Case: [id number] Date/Time: 06/18/20 0715   Procedure: TOTAL SHOULDER ARTHROPLASTY (Right Shoulder)   Anesthesia type: Choice   Pre-op diagnosis: djd right shoulder   Location: [facility] ROOM 07 / [facility]   Surgeons: Marchia Bond, MD      DISCUSSION:87 y.o. former smoker with h/o GERD, COPD, CKD Stage III, CAD (CABG 2018), right shoulder djd scheduled for above procedure 06/18/2020 with Dr. Marchia Bond.   Per cardiology preoperative evaluation 04/17/20, "Chart reviewed as part of pre-operative protocol coverage. Given past medical history and time since last visit, based on ACC/AHA guidelines, Neema L Kissel would be at acceptable risk for the planned procedure without further cardiovascular testing.   I personally spoke with Mr. Smithey today. He reports he has been doing very well from a CV standpoint with no chest pain or other anginal symptoms. He is mainly limited by shoulder pain at this time."  Anticipate pt can proceed with planned procedure barring acute status change.   VS: BP (!) 141/76   Pulse 72   Temp 36.7 C (Oral)   Ht 5' 11.5" (1.816 m)   Wt 94.8 kg   SpO2 95%   BMI 28.74 kg/m   PROVIDERS: Lajean Manes, MD is PCP   Minus Breeding, MD is Cardiologist  LABS: Labs reviewed: Acceptable for surgery. (all labs ordered are listed, but only abnormal results are displayed)  Labs Reviewed  BASIC METABOLIC PANEL - Abnormal; Notable for the following components:      Result Value   Glucose, Bld 102 (*)    BUN 28 (*)    Creatinine, Ser 1.25 (*)    GFR, Estimated 56 (*)    All other components within normal limits  SURGICAL PCR SCREEN  CBC     IMAGES:   EKG: 01/05/2020 Rate 76 bpm  NSR Nonspecific intraventricular block  Nonspecific T wave abnormality   CV: Echo 08/14/2016  Left ventricle: LV systolic function is low normal with an EF of 50-55%.   Septum: No Patent Foramen Ovale present.   Left atrium: Patent foramen ovale not  present.   Aortic valve: No stenosis. No regurgitation.   Mitral valve: Mild regurgitation.   Past Medical History:  Diagnosis Date  . Arthritis    "right hand; back" (08/30/2015)  . Cancer (HCC)    skin  squamous and basal  . Chronic lower back pain   . CKD (chronic kidney disease), stage III ([place])    Stage III  . COPD (chronic obstructive pulmonary disease) (HCC)    no home O2  . Coronary artery disease   . Dysplastic colon polyp age 25   carcinoma in situ  . GERD (gastroesophageal reflux disease)    occ  . H/O hiatal hernia   . Hyperlipidemia   . Hypertension   . Prostate cancer ([place]) 07/2014   active surveillance, [place] 6    Past Surgical History:  Procedure Laterality Date  . CATARACT EXTRACTION W/ INTRAOCULAR LENS IMPLANT Left 08/27/15  . CORONARY ARTERY BYPASS GRAFT N/A 08/14/2016   Procedure: CORONARY ARTERY BYPASS GRAFTING (CABG)x2, using left internal mammary artery and right greater saphenous vein harvested endoscopically;  Surgeon: Gaye Pollack, MD;  Location: [place] OR;  Service: Open Heart Surgery;  Laterality: N/A;  . JOINT REPLACEMENT    . LEFT HEART CATH AND CORONARY ANGIOGRAPHY N/A 08/11/2016   Procedure: Left Heart Cath and Coronary Angiography;  Surgeon: Belva Crome, MD;  Location: [place] [facility];  Service:  Cardiovascular;  Laterality: N/A;  . MOHS SURGERY     multiple SCC  . PROSTATE BIOPSY  07/2014  . TEE WITHOUT CARDIOVERSION N/A 08/14/2016   Procedure: TRANSESOPHAGEAL ECHOCARDIOGRAM (TEE);  Surgeon: Gaye Pollack, MD;  Location: [place];  Service: Open Heart Surgery;  Laterality: N/A;  . TONSILLECTOMY    . TOTAL HIP ARTHROPLASTY  05/26/2011   Procedure: TOTAL HIP ARTHROPLASTY;  Surgeon: Garald Balding, MD;  Location: [place];  Service: Orthopedics;  Laterality: Right;  . ULTRASOUND GUIDANCE FOR VASCULAR ACCESS  08/11/2016   Procedure: Ultrasound Guidance For Vascular Access;  Surgeon: Belva Crome, MD;  Location: [place] [facility];  Service:  Cardiovascular;;    MEDICATIONS: . acetaminophen (TYLENOL) 500 MG tablet  . amoxicillin (AMOXIL) 500 MG tablet  . aspirin 81 MG tablet  . atorvastatin (LIPITOR) 40 MG tablet  . diclofenac Sodium (VOLTAREN) 1 % GEL  . docusate sodium (COLACE) 250 MG capsule  . metoprolol succinate (TOPROL-XL) 50 MG 24 hr tablet  . Multiple Vitamins-Minerals (ONE-A-DAY MENS 50+ ADVANTAGE) TABS  . Omega-3 Fatty Acids (FISH OIL OMEGA-3 PO)  . polyethylene glycol (MIRALAX / GLYCOLAX) 17 g packet  . Tamsulosin HCl (FLOMAX) 0.4 MG CAPS   No current facility-administered medications for this encounter.    Konrad Felix, PA-C [facility] Pre-Surgical Testing [phone]

## 2020-06-14 ENCOUNTER — Other Ambulatory Visit (HOSPITAL_COMMUNITY)
Admission: RE | Admit: 2020-06-14 | Discharge: 2020-06-14 | Disposition: A | Discharge status: Home / Self Care | Payer: Medicare Other | Source: Ambulatory Visit | Attending: Orthopedic Surgery | Admitting: Orthopedic Surgery

## 2020-06-14 DIAGNOSIS — Z01812 Encounter for preprocedural laboratory examination: Secondary | ICD-10-CM | POA: Insufficient documentation

## 2020-06-14 DIAGNOSIS — Z20822 Contact with and (suspected) exposure to covid-19: Secondary | ICD-10-CM | POA: Diagnosis not present

## 2020-06-15 LAB — SARS CORONAVIRUS 2 (TAT 6-24 HRS): SARS Coronavirus 2: NEGATIVE

## 2020-06-17 DIAGNOSIS — C61 Malignant neoplasm of prostate: Secondary | ICD-10-CM | POA: Diagnosis not present

## 2020-06-17 NOTE — H&P (Signed)
SHOULDER ARTHROPLASTY ADMISSION H&P  Patient ID: Marc Gilbert MRN: [id number] DOB/AGE: 04-22-1930 87 y.o.  Chief Complaint: right shoulder pain.  Planned Procedure Date: 06/18/20 Medical Clearance by Dr. Felipa Eth   Cardiac Clearance by Kathyrn Drown, NP   HPI: Marc Gilbert is a 87 y.o. male who presents for evaluation of djd right shoulder. The patient has a history of pain and functional disability in the right shoulder due to arthritis and has failed non-surgical conservative treatments for greater than 12 weeks to include NSAID's and/or analgesics, corticosteriod injections and activity modification.  Onset of symptoms was gradual, starting 2 years ago with rapidlly worsening course since that time. The patient noted no past surgery on the right shoulder.  Patient currently rates pain at 3 out of 10 with activity. Patient has worsening of pain with activity and weight bearing and pain that interferes with activities of daily living.  Patient has evidence of joint space narrowing by imaging studies.  There is no active infection.  Past Medical History:  Diagnosis Date  . Arthritis    "right hand; back" (08/30/2015)  . Cancer (HCC)    skin  squamous and basal  . Chronic lower back pain   . CKD (chronic kidney disease), stage III ([place])    Stage III  . COPD (chronic obstructive pulmonary disease) (HCC)    no home O2  . Coronary artery disease   . Dysplastic colon polyp age 25   carcinoma in situ  . GERD (gastroesophageal reflux disease)    occ  . H/O hiatal hernia   . Hyperlipidemia   . Hypertension   . Prostate cancer ([place]) 07/2014   active surveillance, [place] 6   Past Surgical History:  Procedure Laterality Date  . CATARACT EXTRACTION W/ INTRAOCULAR LENS IMPLANT Left 08/27/15  . CORONARY ARTERY BYPASS GRAFT N/A 08/14/2016   Procedure: CORONARY ARTERY BYPASS GRAFTING (CABG)x2, using left internal mammary artery and right greater saphenous vein harvested endoscopically;   Surgeon: Gaye Pollack, MD;  Location: [facility];  Service: Open Heart Surgery;  Laterality: N/A;  . JOINT REPLACEMENT    . LEFT HEART CATH AND CORONARY ANGIOGRAPHY N/A 08/11/2016   Procedure: Left Heart Cath and Coronary Angiography;  Surgeon: Belva Crome, MD;  Location: [facility];  Service: Cardiovascular;  Laterality: N/A;  . MOHS SURGERY     multiple SCC  . PROSTATE BIOPSY  07/2014  . TEE WITHOUT CARDIOVERSION N/A 08/14/2016   Procedure: TRANSESOPHAGEAL ECHOCARDIOGRAM (TEE);  Surgeon: Gaye Pollack, MD;  Location: [facility];  Service: Open Heart Surgery;  Laterality: N/A;  . TONSILLECTOMY    . TOTAL HIP ARTHROPLASTY  05/26/2011   Procedure: TOTAL HIP ARTHROPLASTY;  Surgeon: Garald Balding, MD;  Location: [facility];  Service: Orthopedics;  Laterality: Right;  . ULTRASOUND GUIDANCE FOR VASCULAR ACCESS  08/11/2016   Procedure: Ultrasound Guidance For Vascular Access;  Surgeon: Belva Crome, MD;  Location: [facility];  Service: Cardiovascular;;   No Known Allergies Prior to Admission medications   Medication Sig Start Date End Date Taking? Authorizing Provider  acetaminophen (TYLENOL) 500 MG tablet Take 500-1,000 mg by mouth every 6 (six) hours as needed for mild pain or moderate pain.   Yes [provider]  amoxicillin (AMOXIL) 500 MG tablet Take 2,000 mg by mouth See admin instructions. Take prior to dental visits. 11/02/19  Yes [provider]  aspirin 81 MG tablet Take 81 mg by mouth daily.   Yes  [provider]  atorvastatin (LIPITOR) 40 MG tablet Take 1 tablet (40 mg total) by mouth daily. 01/05/20  Yes Minus Breeding, MD  diclofenac Sodium (VOLTAREN) 1 % GEL Apply 1 application topically daily.   Yes [provider]  docusate sodium (COLACE) 250 MG capsule Take 250 mg by mouth daily.   Yes [provider]  metoprolol succinate (TOPROL-XL) 50 MG 24 hr tablet TAKE 1 TABLET 2 TIMES DAILY. TAKE WITH OR IMMEDIATELY FOLLOWING A  MEAL. Patient taking differently: Take 50 mg by mouth in the morning and at bedtime. TAKE 1 TABLET 2 TIMES DAILY. TAKE WITH OR IMMEDIATELY FOLLOWING A MEAL. 01/05/20  Yes Minus Breeding, MD  Multiple Vitamins-Minerals (ONE-A-DAY MENS 50+ ADVANTAGE) TABS Take 1 tablet by mouth daily with breakfast.   Yes [provider]  Omega-3 Fatty Acids (FISH OIL OMEGA-3 PO) Take 2 capsules by mouth daily. 980 mg per cap   Yes [provider]  polyethylene glycol (MIRALAX / GLYCOLAX) 17 g packet Take 8.5 g by mouth daily.   Yes [provider]  Tamsulosin HCl (FLOMAX) 0.4 MG CAPS Take 0.4 mg by mouth in the morning and at bedtime. Reports taking one tablet at night then one in the morning.   Yes [provider]   Social History   Socioeconomic History  . Marital status: Married    Spouse name: Not on file  . Number of children: Not on file  . Years of education: Not on file  . Highest education level: Not on file  Occupational History  . Occupation: retired 1996  Tobacco Use  . Smoking status: Former Smoker    Packs/day: 0.50    Years: 25.00    Pack years: 12.50    Types: Cigarettes    Quit date: 05/24/1988    Years since quitting: 32.0  . Smokeless tobacco: Never Used  Vaping Use  . Vaping Use: Never used  Substance and Sexual Activity  . Alcohol use: Yes    Alcohol/week: 10.0 standard drinks    Types: 5 Glasses of wine, 5 Cans of beer per week    Comment: daily  . Drug use: No  . Sexual activity: Not Currently  Other Topics Concern  . Not on file  Social History Narrative  . Not on file   Social Determinants of Health   Financial Resource Strain: Not on file  Food Insecurity: Not on file  Transportation Needs: Not on file  Physical Activity: Not on file  Stress: Not on file  Social Connections: Not on file   Family History  Problem Relation Age of Onset  . Breast cancer Mother 31       mastectomy  . Diabetes Father   . Breast cancer  Daughter        2005. Lumpectomy with chemo and radiation. Under the care of Dr. Jana Hakim    . Pancreatic cancer Neg Hx   . Colon cancer Neg Hx   . Prostate cancer Neg Hx     ROS: Currently denies lightheadedness, dizziness, Fever, chills, CP, SOB.   No personal history of DVT, PE, or CVA. History of MI. No loose teeth or dentures. All other systems have been reviewed and were otherwise currently negative with the exception of those mentioned in the HPI and as above.  Objective: Vitals: Ht: 5\' 10"  Wt: 209.4 lbs Temp: 97.5 BP: 120/71 Pulse: 84 O2 97% on room air.   Physical Exam: General: Alert, NAD.   HEENT: EOMI, Good Neck  Extension  Pulm: No increased work of breathing.  Clear B/L A/P w/o crackle or wheeze.  CV: RRR, No m/g/r appreciated  GI: soft, NT, ND Neuro: Neuro without gross focal deficit.  Sensation intact distally Skin: No lesions in the area of chief complaint MSK/Surgical Site: right shoulder pain with range of motion.  Forward flexion/abduction approximately 130.  Internal rotation to lateral right thigh.  External rotation to 5 deg.  No AC pain.  No Biceps pain.  Intact Rotator cuff strength.  NVI distally.  Imaging Review CT demonstrate severe degenerative joint disease of the right shoulder.    Assessment: djd right shoulder  Plan: Plan for Procedure(s): TOTAL SHOULDER ARTHROPLASTY  The patient history, physical exam, clinical judgement of the provider and imaging are consistent with end stage degenerative joint disease and total joint arthroplasty is deemed medically necessary. The treatment options including medical management, injection therapy, and arthroplasty were discussed at length. The risks and benefits of Procedure(s): TOTAL SHOULDER ARTHROPLASTY were presented and reviewed.  The risks of nonoperative treatment, versus surgical intervention including but not limited to continued pain, aseptic loosening, stiffness, dislocation/subluxation, infection,  bleeding, nerve injury, blood clots, cardiopulmonary complications, morbidity, mortality, among others were discussed. The patient verbalizes understanding and wishes to proceed with the plan.  Patient is being admitted for surgery, OT, pain control, prophylactic antibiotics, VTE prophylaxis, progressive ambulation, ADL's and discharge planning.   Dental prophylaxis discussed and recommended for 2 years postoperatively.   The patient does meet the criteria for TXA which will be used perioperatively.    The patient is planning to be discharged home care of daughter.    Ventura Bruns, PA-C 06/17/2020 1:14 PM

## 2020-06-18 ENCOUNTER — Observation Stay (HOSPITAL_COMMUNITY): Payer: Medicare Other

## 2020-06-18 ENCOUNTER — Encounter (HOSPITAL_COMMUNITY)
Admission: RE | Disposition: A | Discharge status: Home / Self Care | Payer: Self-pay | Source: Home / Self Care | Attending: Orthopedic Surgery

## 2020-06-18 ENCOUNTER — Observation Stay (HOSPITAL_COMMUNITY)
Admission: RE | Admit: 2020-06-18 | Discharge: 2020-06-19 | Disposition: A | Discharge status: Home / Self Care | Payer: Medicare Other | Attending: Orthopedic Surgery | Admitting: Orthopedic Surgery

## 2020-06-18 ENCOUNTER — Ambulatory Visit (HOSPITAL_COMMUNITY): Payer: Medicare Other | Admitting: Certified Registered"

## 2020-06-18 ENCOUNTER — Encounter (HOSPITAL_COMMUNITY): Payer: Self-pay | Admitting: Orthopedic Surgery

## 2020-06-18 ENCOUNTER — Ambulatory Visit (HOSPITAL_COMMUNITY): Payer: Medicare Other | Admitting: Physician Assistant

## 2020-06-18 ENCOUNTER — Other Ambulatory Visit: Payer: Self-pay

## 2020-06-18 DIAGNOSIS — N183 Chronic kidney disease, stage 3 unspecified: Secondary | ICD-10-CM | POA: Insufficient documentation

## 2020-06-18 DIAGNOSIS — M19011 Primary osteoarthritis, right shoulder: Secondary | ICD-10-CM | POA: Diagnosis not present

## 2020-06-18 DIAGNOSIS — J449 Chronic obstructive pulmonary disease, unspecified: Secondary | ICD-10-CM | POA: Insufficient documentation

## 2020-06-18 DIAGNOSIS — Z8546 Personal history of malignant neoplasm of prostate: Secondary | ICD-10-CM | POA: Insufficient documentation

## 2020-06-18 DIAGNOSIS — I2510 Atherosclerotic heart disease of native coronary artery without angina pectoris: Secondary | ICD-10-CM | POA: Diagnosis not present

## 2020-06-18 DIAGNOSIS — Z96641 Presence of right artificial hip joint: Secondary | ICD-10-CM | POA: Diagnosis not present

## 2020-06-18 DIAGNOSIS — I129 Hypertensive chronic kidney disease with stage 1 through stage 4 chronic kidney disease, or unspecified chronic kidney disease: Secondary | ICD-10-CM | POA: Insufficient documentation

## 2020-06-18 DIAGNOSIS — Z87891 Personal history of nicotine dependence: Secondary | ICD-10-CM | POA: Insufficient documentation

## 2020-06-18 DIAGNOSIS — Z7982 Long term (current) use of aspirin: Secondary | ICD-10-CM | POA: Diagnosis not present

## 2020-06-18 DIAGNOSIS — E785 Hyperlipidemia, unspecified: Secondary | ICD-10-CM | POA: Diagnosis not present

## 2020-06-18 DIAGNOSIS — Z79899 Other long term (current) drug therapy: Secondary | ICD-10-CM | POA: Diagnosis not present

## 2020-06-18 DIAGNOSIS — Z96611 Presence of right artificial shoulder joint: Secondary | ICD-10-CM

## 2020-06-18 DIAGNOSIS — Z85828 Personal history of other malignant neoplasm of skin: Secondary | ICD-10-CM | POA: Diagnosis not present

## 2020-06-18 DIAGNOSIS — Z951 Presence of aortocoronary bypass graft: Secondary | ICD-10-CM | POA: Diagnosis not present

## 2020-06-18 DIAGNOSIS — G8918 Other acute postprocedural pain: Secondary | ICD-10-CM | POA: Diagnosis not present

## 2020-06-18 DIAGNOSIS — Z471 Aftercare following joint replacement surgery: Secondary | ICD-10-CM | POA: Diagnosis not present

## 2020-06-18 HISTORY — PX: TOTAL SHOULDER ARTHROPLASTY: SHX126

## 2020-06-18 SURGERY — ARTHROPLASTY, SHOULDER, TOTAL
Anesthesia: General | Site: Shoulder | Laterality: Right

## 2020-06-18 MED ORDER — TRAMADOL HCL 50 MG PO TABS
50.0000 mg | ORAL_TABLET | Freq: Four times a day (QID) | ORAL | Status: DC | PRN
  Filled 2020-06-18: qty 1

## 2020-06-18 MED ORDER — PHENYLEPHRINE HCL (PRESSORS) 10 MG/ML IV SOLN
INTRAVENOUS | Status: DC | PRN
  Administered 2020-06-18 (×5): 100 ug via INTRAVENOUS

## 2020-06-18 MED ORDER — TRANEXAMIC ACID-NACL 1000-0.7 MG/100ML-% IV SOLN
1000.0000 mg | Freq: Once | INTRAVENOUS | Status: AC
  Administered 2020-06-18: 1000 mg via INTRAVENOUS
  Filled 2020-06-18: qty 100

## 2020-06-18 MED ORDER — DIPHENHYDRAMINE HCL 12.5 MG/5ML PO ELIX
12.5000 mg | ORAL_SOLUTION | ORAL | Status: DC | PRN
  Administered 2020-06-19: 25 mg via ORAL
  Filled 2020-06-18: qty 10

## 2020-06-18 MED ORDER — ONDANSETRON HCL 4 MG/2ML IJ SOLN
INTRAMUSCULAR | Status: AC
  Filled 2020-06-18: qty 2

## 2020-06-18 MED ORDER — ACETAMINOPHEN 500 MG PO TABS
500.0000 mg | ORAL_TABLET | Freq: Four times a day (QID) | ORAL | Status: AC
  Administered 2020-06-18 – 2020-06-19 (×3): 500 mg via ORAL
  Filled 2020-06-18 (×3): qty 1

## 2020-06-18 MED ORDER — DEXMEDETOMIDINE (PRECEDEX) IN NS 20 MCG/5ML (4 MCG/ML) IV SYRINGE
PREFILLED_SYRINGE | INTRAVENOUS | Status: DC | PRN
Start: 2020-06-18 — End: 2020-06-18
  Administered 2020-06-18: 4 ug via INTRAVENOUS

## 2020-06-18 MED ORDER — EPHEDRINE 5 MG/ML INJ
INTRAVENOUS | Status: AC
  Filled 2020-06-18: qty 20

## 2020-06-18 MED ORDER — ATORVASTATIN CALCIUM 40 MG PO TABS
40.0000 mg | ORAL_TABLET | Freq: Every day | ORAL | Status: DC
  Administered 2020-06-18: 40 mg via ORAL
  Filled 2020-06-18: qty 1

## 2020-06-18 MED ORDER — 0.9 % SODIUM CHLORIDE (POUR BTL) OPTIME
TOPICAL | Status: DC | PRN
  Administered 2020-06-18: 1000 mL

## 2020-06-18 MED ORDER — ONDANSETRON HCL 4 MG PO TABS: 4.0000 mg | ORAL_TABLET | Freq: Four times a day (QID) | ORAL | Status: DC | PRN

## 2020-06-18 MED ORDER — CHLORHEXIDINE GLUCONATE 0.12 % MT SOLN
15.0000 mL | Freq: Once | OROMUCOSAL | Status: AC
  Administered 2020-06-18: 15 mL via OROMUCOSAL

## 2020-06-18 MED ORDER — PROPOFOL 10 MG/ML IV BOLUS
INTRAVENOUS | Status: AC
  Filled 2020-06-18: qty 20

## 2020-06-18 MED ORDER — ACETAMINOPHEN 325 MG PO TABS
325.0000 mg | ORAL_TABLET | Freq: Four times a day (QID) | ORAL | Status: DC | PRN
Start: 2020-06-19 — End: 2020-06-19

## 2020-06-18 MED ORDER — METOCLOPRAMIDE HCL 5 MG PO TABS
5.0000 mg | ORAL_TABLET | Freq: Three times a day (TID) | ORAL | Status: DC | PRN
Start: 2020-06-18 — End: 2020-06-19

## 2020-06-18 MED ORDER — MORPHINE SULFATE (PF) 2 MG/ML IV SOLN: 0.5000 mg | INTRAVENOUS | Status: DC | PRN

## 2020-06-18 MED ORDER — ACETAMINOPHEN 500 MG PO TABS
ORAL_TABLET | ORAL | Status: AC
  Filled 2020-06-18: qty 2

## 2020-06-18 MED ORDER — POTASSIUM CHLORIDE IN NACL 20-0.45 MEQ/L-% IV SOLN
INTRAVENOUS | Status: DC
  Filled 2020-06-18: qty 1000

## 2020-06-18 MED ORDER — STERILE WATER FOR IRRIGATION IR SOLN
Status: DC | PRN
  Administered 2020-06-18: 2000 mL

## 2020-06-18 MED ORDER — METOCLOPRAMIDE HCL 5 MG/ML IJ SOLN: 5.0000 mg | Freq: Three times a day (TID) | INTRAMUSCULAR | Status: DC | PRN

## 2020-06-18 MED ORDER — TAMSULOSIN HCL 0.4 MG PO CAPS
0.4000 mg | ORAL_CAPSULE | Freq: Two times a day (BID) | ORAL | Status: DC
  Administered 2020-06-18 – 2020-06-19 (×2): 0.4 mg via ORAL
  Filled 2020-06-18 (×2): qty 1

## 2020-06-18 MED ORDER — PHENYLEPHRINE 40 MCG/ML (10ML) SYRINGE FOR IV PUSH (FOR BLOOD PRESSURE SUPPORT)
PREFILLED_SYRINGE | INTRAVENOUS | Status: AC
  Filled 2020-06-18: qty 10

## 2020-06-18 MED ORDER — VASOPRESSIN 20 UNIT/ML IV SOLN
INTRAVENOUS | Status: AC
  Filled 2020-06-18: qty 1

## 2020-06-18 MED ORDER — MIDAZOLAM HCL 2 MG/2ML IJ SOLN
INTRAMUSCULAR | Status: AC
  Filled 2020-06-18: qty 2

## 2020-06-18 MED ORDER — AMISULPRIDE (ANTIEMETIC) 5 MG/2ML IV SOLN: 10.0000 mg | Freq: Once | INTRAVENOUS | Status: DC | PRN

## 2020-06-18 MED ORDER — PHENYLEPHRINE 40 MCG/ML (10ML) SYRINGE FOR IV PUSH (FOR BLOOD PRESSURE SUPPORT)
PREFILLED_SYRINGE | INTRAVENOUS | Status: DC | PRN
  Administered 2020-06-18: 100 ug via INTRAVENOUS
  Administered 2020-06-18 (×2): 80 ug via INTRAVENOUS
  Administered 2020-06-18 (×2): 100 ug via INTRAVENOUS

## 2020-06-18 MED ORDER — ONDANSETRON HCL 4 MG/2ML IJ SOLN: 4.0000 mg | Freq: Four times a day (QID) | INTRAMUSCULAR | Status: DC | PRN

## 2020-06-18 MED ORDER — DOCUSATE SODIUM 100 MG PO CAPS
200.0000 mg | ORAL_CAPSULE | Freq: Every day | ORAL | Status: DC
  Administered 2020-06-18 – 2020-06-19 (×2): 200 mg via ORAL
  Filled 2020-06-18 (×2): qty 2

## 2020-06-18 MED ORDER — OXYCODONE HCL 5 MG/5ML PO SOLN: 5.0000 mg | Freq: Once | ORAL | Status: DC | PRN

## 2020-06-18 MED ORDER — TRANEXAMIC ACID-NACL 1000-0.7 MG/100ML-% IV SOLN
1000.0000 mg | INTRAVENOUS | Status: AC
  Administered 2020-06-18: 1000 mg via INTRAVENOUS

## 2020-06-18 MED ORDER — LIDOCAINE HCL (CARDIAC) PF 100 MG/5ML IV SOSY
PREFILLED_SYRINGE | INTRAVENOUS | Status: DC | PRN
  Administered 2020-06-18: 50 mg via INTRAVENOUS

## 2020-06-18 MED ORDER — PHENYLEPHRINE HCL (PRESSORS) 10 MG/ML IV SOLN
INTRAVENOUS | Status: AC
  Filled 2020-06-18: qty 1

## 2020-06-18 MED ORDER — MIDAZOLAM HCL 5 MG/5ML IJ SOLN
INTRAMUSCULAR | Status: DC | PRN
  Administered 2020-06-18: 1 mg via INTRAVENOUS

## 2020-06-18 MED ORDER — BUPIVACAINE LIPOSOME 1.3 % IJ SUSP
INTRAMUSCULAR | Status: DC | PRN
  Administered 2020-06-18: 10 mL via PERINEURAL

## 2020-06-18 MED ORDER — ORAL CARE MOUTH RINSE: 15.0000 mL | Freq: Once | OROMUCOSAL | Status: AC

## 2020-06-18 MED ORDER — MENTHOL 3 MG MT LOZG: 1.0000 | LOZENGE | OROMUCOSAL | Status: DC | PRN

## 2020-06-18 MED ORDER — FENTANYL CITRATE (PF) 100 MCG/2ML IJ SOLN
INTRAMUSCULAR | Status: AC
  Filled 2020-06-18: qty 2

## 2020-06-18 MED ORDER — METOPROLOL SUCCINATE ER 50 MG PO TB24
50.0000 mg | ORAL_TABLET | Freq: Two times a day (BID) | ORAL | Status: DC
  Administered 2020-06-18 – 2020-06-19 (×2): 50 mg via ORAL
  Filled 2020-06-18 (×2): qty 1

## 2020-06-18 MED ORDER — ADULT MULTIVITAMIN W/MINERALS CH
1.0000 | ORAL_TABLET | Freq: Every day | ORAL | Status: DC
  Administered 2020-06-19: 1 via ORAL
  Filled 2020-06-18: qty 1

## 2020-06-18 MED ORDER — FENTANYL CITRATE (PF) 100 MCG/2ML IJ SOLN
INTRAMUSCULAR | Status: DC | PRN
  Administered 2020-06-18: 50 ug via INTRAVENOUS

## 2020-06-18 MED ORDER — PROPOFOL 10 MG/ML IV BOLUS
INTRAVENOUS | Status: DC | PRN
  Administered 2020-06-18: 20 mg via INTRAVENOUS
  Administered 2020-06-18: 100 mg via INTRAVENOUS

## 2020-06-18 MED ORDER — POLYETHYLENE GLYCOL 3350 17 G PO PACK: 17.0000 g | PACK | Freq: Every day | ORAL | Status: DC | PRN

## 2020-06-18 MED ORDER — ONDANSETRON HCL 4 MG/2ML IJ SOLN
INTRAMUSCULAR | Status: DC | PRN
  Administered 2020-06-18: 4 mg via INTRAVENOUS

## 2020-06-18 MED ORDER — SUGAMMADEX SODIUM 200 MG/2ML IV SOLN
INTRAVENOUS | Status: DC | PRN
  Administered 2020-06-18 (×2): 100 mg via INTRAVENOUS

## 2020-06-18 MED ORDER — OXYCODONE HCL 5 MG PO TABS: 5.0000 mg | ORAL_TABLET | Freq: Once | ORAL | Status: DC | PRN

## 2020-06-18 MED ORDER — DEXAMETHASONE SODIUM PHOSPHATE 10 MG/ML IJ SOLN
INTRAMUSCULAR | Status: DC | PRN
  Administered 2020-06-18: 4 mg via INTRAVENOUS

## 2020-06-18 MED ORDER — HYDROMORPHONE HCL 1 MG/ML IJ SOLN: 0.2500 mg | INTRAMUSCULAR | Status: DC | PRN

## 2020-06-18 MED ORDER — PHENOL 1.4 % MT LIQD: 1.0000 | OROMUCOSAL | Status: DC | PRN

## 2020-06-18 MED ORDER — PROMETHAZINE HCL 25 MG/ML IJ SOLN: 6.2500 mg | INTRAMUSCULAR | Status: DC | PRN

## 2020-06-18 MED ORDER — SODIUM CHLORIDE 0.9 % IR SOLN
Status: DC | PRN
  Administered 2020-06-18: 1000 mL

## 2020-06-18 MED ORDER — CEFAZOLIN SODIUM-DEXTROSE 2-4 GM/100ML-% IV SOLN
2.0000 g | INTRAVENOUS | Status: AC
  Administered 2020-06-18: 2 g via INTRAVENOUS

## 2020-06-18 MED ORDER — POVIDONE-IODINE 10 % EX SWAB
2.0000 "application " | Freq: Once | CUTANEOUS | Status: AC
  Administered 2020-06-18: 2 via TOPICAL

## 2020-06-18 MED ORDER — ROCURONIUM BROMIDE 10 MG/ML (PF) SYRINGE
PREFILLED_SYRINGE | INTRAVENOUS | Status: AC
  Filled 2020-06-18: qty 20

## 2020-06-18 MED ORDER — ALUM & MAG HYDROXIDE-SIMETH 200-200-20 MG/5ML PO SUSP: 30.0000 mL | ORAL | Status: DC | PRN

## 2020-06-18 MED ORDER — LIDOCAINE 2% (20 MG/ML) 5 ML SYRINGE
INTRAMUSCULAR | Status: AC
  Filled 2020-06-18: qty 5

## 2020-06-18 MED ORDER — ACETAMINOPHEN 500 MG PO TABS
1000.0000 mg | ORAL_TABLET | Freq: Once | ORAL | Status: AC
  Administered 2020-06-18: 1000 mg via ORAL

## 2020-06-18 MED ORDER — CEFAZOLIN SODIUM-DEXTROSE 2-4 GM/100ML-% IV SOLN
INTRAVENOUS | Status: AC
  Filled 2020-06-18: qty 100

## 2020-06-18 MED ORDER — ROCURONIUM BROMIDE 100 MG/10ML IV SOLN
INTRAVENOUS | Status: DC | PRN
  Administered 2020-06-18: 100 mg via INTRAVENOUS
  Administered 2020-06-18: 10 mg via INTRAVENOUS

## 2020-06-18 MED ORDER — SODIUM CHLORIDE 0.9 % IV SOLN
INTRAVENOUS | Status: DC | PRN
  Administered 2020-06-18: 13 ug/min via INTRAVENOUS

## 2020-06-18 MED ORDER — CEFAZOLIN SODIUM-DEXTROSE 2-4 GM/100ML-% IV SOLN
2.0000 g | Freq: Four times a day (QID) | INTRAVENOUS | Status: AC
  Administered 2020-06-18 – 2020-06-19 (×3): 2 g via INTRAVENOUS
  Filled 2020-06-18 (×3): qty 100

## 2020-06-18 MED ORDER — ASPIRIN EC 81 MG PO TBEC
81.0000 mg | DELAYED_RELEASE_TABLET | Freq: Every day | ORAL | Status: DC
  Administered 2020-06-19: 81 mg via ORAL
  Filled 2020-06-18: qty 1

## 2020-06-18 MED ORDER — HYDROCODONE-ACETAMINOPHEN 5-325 MG PO TABS
1.0000 | ORAL_TABLET | ORAL | Status: DC | PRN
  Administered 2020-06-19 (×2): 1 via ORAL
  Filled 2020-06-18 (×2): qty 1

## 2020-06-18 MED ORDER — BUPIVACAINE HCL (PF) 0.5 % IJ SOLN
INTRAMUSCULAR | Status: DC | PRN
  Administered 2020-06-18: 20 mL via PERINEURAL

## 2020-06-18 MED ORDER — TRANEXAMIC ACID-NACL 1000-0.7 MG/100ML-% IV SOLN
INTRAVENOUS | Status: AC
  Filled 2020-06-18: qty 100

## 2020-06-18 MED ORDER — MAGNESIUM CITRATE PO SOLN: 1.0000 | Freq: Once | ORAL | Status: DC | PRN

## 2020-06-18 MED ORDER — LACTATED RINGERS IV SOLN: INTRAVENOUS | Status: DC

## 2020-06-18 MED ORDER — VASOPRESSIN 20 UNIT/ML IV SOLN
INTRAVENOUS | Status: DC | PRN
  Administered 2020-06-18 (×5): .2 [IU] via INTRAVENOUS

## 2020-06-18 MED ORDER — EPHEDRINE SULFATE 50 MG/ML IJ SOLN
INTRAMUSCULAR | Status: DC | PRN
  Administered 2020-06-18: 10 mg via INTRAVENOUS
  Administered 2020-06-18: 20 mg via INTRAVENOUS
  Administered 2020-06-18 (×2): 10 mg via INTRAVENOUS

## 2020-06-18 MED ORDER — BISACODYL 10 MG RE SUPP: 10.0000 mg | Freq: Every day | RECTAL | Status: DC | PRN

## 2020-06-18 SURGICAL SUPPLY — 61 items
BAG ZIPLOCK 12X15 (MISCELLANEOUS) IMPLANT
BIT DRILL QUICK REL 1/8 2PK SL (DRILL) ×1 IMPLANT
BLADE SAW SGTL 18X1.27X75 (BLADE) ×2 IMPLANT
CEMENT BONE R 1X40 (Cement) ×2 IMPLANT
CLSR STERI-STRIP ANTIMIC 1/2X4 (GAUZE/BANDAGES/DRESSINGS) ×2 IMPLANT
COOLER ICEMAN CLASSIC (MISCELLANEOUS) IMPLANT
COVER BACK TABLE 60X90IN (DRAPES) ×2 IMPLANT
COVER MAYO STAND STRL (DRAPES) ×2 IMPLANT
COVER SURGICAL LIGHT HANDLE (MISCELLANEOUS) ×2 IMPLANT
COVER WAND RF STERILE (DRAPES) ×2 IMPLANT
DECANTER SPIKE VIAL GLASS SM (MISCELLANEOUS) IMPLANT
DRAPE POUCH INSTRU U-SHP 10X18 (DRAPES) ×2 IMPLANT
DRAPE SHEET LG 3/4 BI-LAMINATE (DRAPES) ×4 IMPLANT
DRAPE SURG 17X11 SM STRL (DRAPES) ×2 IMPLANT
DRAPE U-SHAPE 47X51 STRL (DRAPES) ×2 IMPLANT
DRILL QUICK RELEASE 1/8 INCH (DRILL) ×2
DRSG MEPILEX BORDER 4X8 (GAUZE/BANDAGES/DRESSINGS) ×2 IMPLANT
DURAPREP 26ML APPLICATOR (WOUND CARE) ×4 IMPLANT
ELECT REM PT RETURN 15FT ADLT (MISCELLANEOUS) ×2 IMPLANT
GLENOID MOD PE 4 PEG SZ 4 (Shoulder) ×2 IMPLANT
GLENOID MOD POST TM SZ2 (Post) ×2 IMPLANT
GLOVE SRG 8 PF TXTR STRL LF DI (GLOVE) ×2 IMPLANT
GLOVE SURG ENC MOIS LTX SZ7 (GLOVE) ×2 IMPLANT
GLOVE SURG ENC MOIS LTX SZ7.5 (GLOVE) ×2 IMPLANT
GLOVE SURG ENC MOIS LTX SZ8 (GLOVE) ×2 IMPLANT
GLOVE SURG UNDER POLY LF SZ7.5 (GLOVE) ×2 IMPLANT
GLOVE SURG UNDER POLY LF SZ8 (GLOVE) ×4
GOWN STRL REUS W/TWL LRG LVL3 (GOWN DISPOSABLE) ×4 IMPLANT
HANDPIECE INTERPULSE COAX TIP (DISPOSABLE) ×2
HEAD HUMERAL BIPOLAR 42X18X46 (Orthopedic Implant) ×1 IMPLANT
HEAD HUMERAL COMP STD (Orthopedic Implant) ×1 IMPLANT
HOOD PEEL AWAY FLYTE STAYCOOL (MISCELLANEOUS) ×6 IMPLANT
HUMERAL HEAD BIPOLAR 42X18X46 (Orthopedic Implant) ×2 IMPLANT
HUMERAL HEAD COMP STD (Orthopedic Implant) ×2 IMPLANT
KIT BASIN OR (CUSTOM PROCEDURE TRAY) ×2 IMPLANT
KIT TURNOVER KIT A (KITS) ×2 IMPLANT
NS IRRIG 1000ML POUR BTL (IV SOLUTION) ×2 IMPLANT
PACK SHOULDER (CUSTOM PROCEDURE TRAY) ×2 IMPLANT
PAD COLD SHLDR WRAP-ON (PAD) ×2 IMPLANT
PENCIL SMOKE EVACUATOR (MISCELLANEOUS) IMPLANT
PIN THREADED REVERSE (PIN) ×2 IMPLANT
PROTECTOR NERVE ULNAR (MISCELLANEOUS) ×2 IMPLANT
RESTRAINT HEAD UNIVERSAL NS (MISCELLANEOUS) ×2 IMPLANT
SET HNDPC FAN SPRY TIP SCT (DISPOSABLE) ×1 IMPLANT
SLING ARM IMMOBILIZER LRG (SOFTGOODS) ×2 IMPLANT
SMARTMIX MINI TOWER (MISCELLANEOUS) ×2
STEM HUMERAL STRL 14MMX140MM (Stem) ×2 IMPLANT
SUCTION FRAZIER HANDLE 12FR (TUBING) ×2
SUCTION TUBE FRAZIER 12FR DISP (TUBING) ×1 IMPLANT
SUPPORT WRAP ARM LG (MISCELLANEOUS) ×2 IMPLANT
SUT FIBERWIRE #2 38 REV NDL BL (SUTURE) ×8
SUT MAXBRAID #5 CCS-NDL 2PK (SUTURE) ×2 IMPLANT
SUT VIC AB 1 CT1 36 (SUTURE) ×2 IMPLANT
SUT VIC AB 2-0 CT1 27 (SUTURE) ×2
SUT VIC AB 2-0 CT1 TAPERPNT 27 (SUTURE) ×1 IMPLANT
SUT VIC AB 3-0 SH 8-18 (SUTURE) ×2 IMPLANT
SUTURE FIBERWR#2 38 REV NDL BL (SUTURE) ×4 IMPLANT
TOWEL OR 17X26 10 PK STRL BLUE (TOWEL DISPOSABLE) ×2 IMPLANT
TOWEL OR NON WOVEN STRL DISP B (DISPOSABLE) ×2 IMPLANT
TOWER SMARTMIX MINI (MISCELLANEOUS) ×1 IMPLANT
WATER STERILE IRR 1000ML POUR (IV SOLUTION) ×4 IMPLANT

## 2020-06-18 NOTE — Discharge Instructions (Signed)
Diet: As you were doing prior to hospitalization   Shower:  May shower but keep the wounds dry, use an occlusive plastic wrap, NO SOAKING IN TUB.  If the bandage gets wet, change with a clean dry gauze.    Dressing:  You may change your dressing 3-5 days after surgery, if you see drainage coming through your bandage. If you do not, you may leave your dressing in place until next visit. There are sticky tapes (steri-strips) on your wounds and all the stitches are absorbable.  Leave the steri-strips in place when changing your dressings, they will peel off with time, usually 2-3 weeks.  Activity:  Increase activity slowly as tolerated, but follow the weight bearing instructions below.  The rules on driving is that you can not be taking narcotics while you drive, and you must feel in control of the vehicle.    Weight Bearing: No weight bearing with right arm. Keep arm in sling, you may do the wrist, elbow, and hand exercises given to you by occupational therapy.   To prevent constipation: you may use a stool softener such as -  Colace (over the counter) 100 mg by mouth twice a day  Drink plenty of fluids (prune juice may be helpful) and high fiber foods Miralax (over the counter) for constipation as needed.    Itching:  If you experience itching with your medications, try taking only a single pain pill, or even half a pain pill at a time.  You may take up to 10 pain pills per day, and you can also use benadryl over the counter for itching or also to help with sleep.   Precautions:  If you experience chest pain or shortness of breath - call 911 immediately for transfer to the hospital emergency department!!  If you develop a fever greater that 101 F, purulent drainage from wound, increased redness or drainage from wound, or calf pain -- Call the office at [phone]                                                Follow- Up Appointment:  Please call for an appointment to be seen in 2 weeks  [place] - [phone]

## 2020-06-18 NOTE — Plan of Care (Signed)
  Problem: Education: Goal: Knowledge of General Education information will improve Description Including pain rating scale, medication(s)/side effects and non-pharmacologic comfort measures Outcome: Progressing   

## 2020-06-18 NOTE — Anesthesia Procedure Notes (Signed)
Anesthesia Regional Block: Interscalene brachial plexus block   Pre-Anesthetic Checklist: ,, timeout performed, Correct Patient, Correct Site, Correct Laterality, Correct Procedure, Correct Position, site marked, Risks and benefits discussed,  Surgical consent,  Pre-op evaluation,  At surgeon's request and post-op pain management  Laterality: Right  Prep: chloraprep       Needles:  Injection technique: Single-shot  Needle Type: Stimiplex     Needle Length: 9cm  Needle Gauge: 21     Additional Needles:   Procedures:,,,, ultrasound used (permanent image in chart),,,,  Narrative:  Start time: 06/18/2020 7:18 AM End time: 06/18/2020 7:23 AM Injection made incrementally with aspirations every 5 mL.  Performed by: Personally  Anesthesiologist: Lynda Rainwater, MD

## 2020-06-18 NOTE — Interval H&P Note (Signed)
History and Physical Interval Note:  06/18/2020 7:18 AM  Marc Gilbert  has presented today for surgery, with the diagnosis of djd right shoulder.  The various methods of treatment have been discussed with the patient and family. After consideration of risks, benefits and other options for treatment, the patient has consented to  Procedure(s): TOTAL SHOULDER ARTHROPLASTY (Right) as a surgical intervention.  The patient's history has been reviewed, patient examined, no change in status, stable for surgery.  I have reviewed the patient's chart and labs.  Questions were answered to the patient's satisfaction.     [name]

## 2020-06-18 NOTE — Op Note (Signed)
06/18/2020  10:27 AM  PATIENT:  Marc Gilbert    PRE-OPERATIVE DIAGNOSIS: Right shoulder primary localized osteoarthritis  POST-OPERATIVE DIAGNOSIS:  Same  PROCEDURE: RIGHT total Shoulder Arthroplasty  SURGEON:  [name], MD  PHYSICIAN ASSISTANT: Merlene Pulling, PA-C, present and scrubbed throughout the case, critical for completion in a timely fashion, and for retraction, instrumentation, and closure.  ANESTHESIA:   General with an interscalene block  ESTIMATED BLOOD LOSS: 100 mL  UNIQUE ASPECTS OF THE CASE: His rotator cuff was intact.  The humeral head was significantly flattened.  There was moderate inferior osteophyte.  He had a little bit of posterior wear, I reamed down the high side anteriorly, and had excellent fixation on the glenoid with all 4 pegs contained.  I debated on the size of the humeral head, initially it was fairly large, but after removing all of the osteophytes the 42 felt like it was recreated the anatomy the best.  The 46 felt a little bit very large.  PREOPERATIVE INDICATIONS:  Marc Gilbert is a  87 y.o. male who failed conservative measures and elected for surgical management.    The risks benefits and alternatives were discussed with the patient preoperatively including but not limited to the risks of infection, bleeding, nerve injury, cardiopulmonary complications, the need for revision surgery, dislocation, loosening, incomplete relief of pain, among others, and the patient was willing to proceed.   OPERATIVE IMPLANTS: Biomet size 14 micro press-fit humeral stem, size 42+18 Versa-dial humeral head, set in the C position with increased coverage posterosuperiorly, with a 4 cemented glenoid polyethylene 3 peg implant with a central regenerex noncemented post.   OPERATIVE FINDINGS: Advanced glenohumeral osteoarthritis involving the glenoid and the humeral head with substantial osteophyte formation inferiorly.   OPERATIVE PROCEDURE: The patient was  brought to the operating room and placed in the supine position. General anesthesia was administered. IV antibiotics were given.  The upper extremity was prepped and draped in usual sterile fashion. The patient was in a beachchair position with all bony prominences padded.   Time out was performed and a deltopectoral approach was carried out. The biceps tendon was tenodesed to the pectoralis tendon. The subscapularis was released, tagging it with a #2 FiberWire, leaving a cuff of tendon for repair.   The inferior osteophyte was removed, and release of the capsule off of the humeral side was completed. The head was dislocated, and I reamed sequentially. I placed the humeral cutting guide at 30 of retroversion, and then pinned this into place, and made my humeral neck cut. This was at the appropriate level.   I then placed deep retractors and exposed the glenoid. I excised the labrum circumferentially, taking care to protect the axillary nerve inferiorly.   I then placed a guidewire into the center position, controlling appropriate version and inclination. I then reamed over the guidewire with the small reamer, and was satisfied with the preparation. I preserved the subchondral bone in order to maximize the strength and minimize the risk for subsequent subsidence.   I then drilled the central hole for the regenerex peg, and then placed the guide, and then drilled the 3 peripheral peg holes. I had excellent bony circumferential contact.   I then cleaned the glenoid, irrigated it copiously, and then dried it and cemented the prosthesis into place. Excellent seating was achieved. I had full exposure. The cement cured while I turned my attention to the humeral side.   I sequentially broached, up to the selected  size, with the broach set at 30 of retroversion. I placed 3 #2 Fiberwire through the bone for subsequent repair.  I then placed the real stem. I trialed with multiple heads, and the above-named  component was selected. Increased posterior coverage improved the coverage. The soft tissue tension was appropriate.   I then impacted the real humeral head into place, reduced the head, and irrigated copiously. Excellent stability and range of motion was achieved. I repaired the subscapularis with a total of 3 #2 FiberWire and 2 #5 Maxbraid ; one for the interval, one for the corner, and then the remaining three from the lesser tuberosity which had already been passed.  Excellent repair achieved and I irrigated copiously once more. The subcutaneous tissue was closed with Vicryl including the deltopectoral fascia.   The skin was closed with Steri-Strips and sterile gauze was applied. He had a preoperative nerve block. He tolerated the procedure well and there were no complications.

## 2020-06-18 NOTE — Anesthesia Postprocedure Evaluation (Signed)
Anesthesia Post Note  Patient: Marc Gilbert  Procedure(s) Performed: TOTAL SHOULDER ARTHROPLASTY (Right Shoulder)     Patient location during evaluation: PACU Anesthesia Type: General Level of consciousness: awake and alert Pain management: pain level controlled Vital Signs Assessment: post-procedure vital signs reviewed and stable Respiratory status: spontaneous breathing, nonlabored ventilation and respiratory function stable Cardiovascular status: blood pressure returned to baseline and stable Postop Assessment: no apparent nausea or vomiting Anesthetic complications: no   No complications documented.  Last Vitals:  Vitals:   06/18/20 1145 06/18/20 1200  BP: 126/64 125/64  Pulse: 70 71  Resp: 19 18  Temp:    SpO2: 98% 99%    Last Pain:  Vitals:   06/18/20 1200  TempSrc:   PainSc: 0-No pain                 Lynda Rainwater

## 2020-06-18 NOTE — Anesthesia Procedure Notes (Signed)
Procedure Name: Intubation Date/Time: 06/18/2020 7:55 AM Performed by: Adalberto Ill, CRNA Pre-anesthesia Checklist: Patient identified, Emergency Drugs available, Suction available, Timeout performed and Patient being monitored Patient Re-evaluated:Patient Re-evaluated prior to induction Oxygen Delivery Method: Circle system utilized Preoxygenation: Pre-oxygenation with 100% oxygen Induction Type: IV induction Ventilation: Mask ventilation without difficulty Laryngoscope Size: Mac and 3 Grade View: Grade III Tube type: Oral Tube size: 7.5 mm Number of attempts: 1 Airway Equipment and Method: Stylet Placement Confirmation: ETT inserted through vocal cords under direct vision,  positive ETCO2 and breath sounds checked- equal and bilateral Secured at: 22 cm Tube secured with: Tape Dental Injury: Teeth and Oropharynx as per pre-operative assessment

## 2020-06-18 NOTE — Transfer of Care (Signed)
Immediate Anesthesia Transfer of Care Note  Patient: Marc Gilbert  Procedure(s) Performed: TOTAL SHOULDER ARTHROPLASTY (Right Shoulder)  Patient Location: PACU  Anesthesia Type:GA combined with regional for post-op pain  Level of Consciousness: awake, alert , oriented and patient cooperative  Airway & Oxygen Therapy: Patient Spontanous Breathing and Patient connected to face mask oxygen  Post-op Assessment: Report given to RN and Post -op Vital signs reviewed and stable  Post vital signs: Reviewed and stable  Last Vitals:  Vitals Value Taken Time  BP 119/67 06/18/20 1045  Temp    Pulse 69 06/18/20 1048  Resp 20 06/18/20 1048  SpO2 100 % 06/18/20 1048  Vitals shown include unvalidated device data.  Last Pain:  Vitals:   06/18/20 0545  TempSrc:   PainSc: 0-No pain      Patients Stated Pain Goal: 4 (19/75/88 3254)  Complications: No complications documented.

## 2020-06-19 DIAGNOSIS — I2510 Atherosclerotic heart disease of native coronary artery without angina pectoris: Secondary | ICD-10-CM | POA: Diagnosis not present

## 2020-06-19 DIAGNOSIS — J449 Chronic obstructive pulmonary disease, unspecified: Secondary | ICD-10-CM | POA: Diagnosis not present

## 2020-06-19 DIAGNOSIS — M19011 Primary osteoarthritis, right shoulder: Secondary | ICD-10-CM | POA: Diagnosis not present

## 2020-06-19 DIAGNOSIS — N183 Chronic kidney disease, stage 3 unspecified: Secondary | ICD-10-CM | POA: Diagnosis not present

## 2020-06-19 DIAGNOSIS — Z85828 Personal history of other malignant neoplasm of skin: Secondary | ICD-10-CM | POA: Diagnosis not present

## 2020-06-19 DIAGNOSIS — I129 Hypertensive chronic kidney disease with stage 1 through stage 4 chronic kidney disease, or unspecified chronic kidney disease: Secondary | ICD-10-CM | POA: Diagnosis not present

## 2020-06-19 LAB — BASIC METABOLIC PANEL
Anion gap: 7 (ref 5–15)
BUN: 25 mg/dL — ABNORMAL HIGH (ref 8–23)
CO2: 26 mmol/L (ref 22–32)
Calcium: 9 mg/dL (ref 8.9–10.3)
Chloride: 107 mmol/L (ref 98–111)
Creatinine, Ser: 0.99 mg/dL (ref 0.61–1.24)
GFR, Estimated: >60 mL/min (ref >60)
Glucose, Bld: 112 mg/dL — ABNORMAL HIGH (ref 70–99)
Potassium: 4.4 mmol/L (ref 3.5–5.1)
Sodium: 140 mmol/L (ref 135–145)

## 2020-06-19 LAB — CBC
HCT: 38.5 % — ABNORMAL LOW (ref 39.0–52.0)
Hemoglobin: 12.5 g/dL — ABNORMAL LOW (ref 13.0–17.0)
MCH: 31.4 pg (ref 26.0–34.0)
MCHC: 32.5 g/dL (ref 30.0–36.0)
MCV: 96.7 fL (ref 80.0–100.0)
Platelets: 171 10*3/uL (ref 150–400)
RBC: 3.98 MIL/uL — ABNORMAL LOW (ref 4.22–5.81)
RDW: 13.2 % (ref 11.5–15.5)
WBC: 9.5 10*3/uL (ref 4.0–10.5)
nRBC: 0 % (ref 0.0–0.2)

## 2020-06-19 MED ORDER — HYDROCODONE-ACETAMINOPHEN 5-325 MG PO TABS: 1.0000 | ORAL_TABLET | Freq: Four times a day (QID) | ORAL | 0 refills | Status: DC | PRN

## 2020-06-19 MED ORDER — ONDANSETRON HCL 4 MG PO TABS: 4.0000 mg | ORAL_TABLET | Freq: Three times a day (TID) | ORAL | 0 refills | Status: DC | PRN

## 2020-06-19 NOTE — Progress Notes (Addendum)
Subjective: 1 Day Post-Op s/p Procedure(s): TOTAL SHOULDER ARTHROPLASTY  Patient is alert, oriented. Patient reports pain as moderate.Denies chest pain, SOB, Calf pain. No nausea/vomiting. No other complaints.    Objective:  PE: VITALS:   Vitals:   06/18/20 1923 06/18/20 2320 06/19/20 0201 06/19/20 0604  BP: 123/63 133/75 127/83 129/75  Pulse: 85 77 77 76  Resp: 20 18 16 16   Temp: 98 F (36.7 C) 98.1 F (36.7 C) 98.3 F (36.8 C) 97.8 F (36.6 C)  TempSrc: Oral Oral Oral Oral  SpO2: 95% 94% 97% 98%  Weight:      Height:        ABD soft Intact pulses distally Incision: scant drainage  Able to flex, extend, and abduct all fingers of the right hand. Full ROM right wrist. Sensation intact to axillary nerve distribution. Sensation intact to all fingers of right hand.   LABS  Results for orders placed or performed during the hospital encounter of 06/18/20 (from the past 24 hour(s))  CBC     Status: Abnormal   Collection Time: 06/19/20  3:06 AM  Result Value Ref Range   WBC 9.5 4.0 - 10.5 K/uL   RBC 3.98 (L) 4.22 - 5.81 MIL/uL   Hemoglobin 12.5 (L) 13.0 - 17.0 g/dL   HCT 38.5 (L) 39.0 - 52.0 %   MCV 96.7 80.0 - 100.0 fL   MCH 31.4 26.0 - 34.0 pg   MCHC 32.5 30.0 - 36.0 g/dL   RDW 13.2 11.5 - 15.5 %   Platelets 171 150 - 400 K/uL   nRBC 0.0 0.0 - 0.2 %  Basic metabolic panel     Status: Abnormal   Collection Time: 06/19/20  3:06 AM  Result Value Ref Range   Sodium 140 135 - 145 mmol/L   Potassium 4.4 3.5 - 5.1 mmol/L   Chloride 107 98 - 111 mmol/L   CO2 26 22 - 32 mmol/L   Glucose, Bld 112 (H) 70 - 99 mg/dL   BUN 25 (H) 8 - 23 mg/dL   Creatinine, Ser 0.99 0.61 - 1.24 mg/dL   Calcium 9.0 8.9 - 10.3 mg/dL   GFR, Estimated >60 >60 mL/min   Anion gap 7 5 - 15    DG Shoulder Right Port  Result Date: 06/18/2020 CLINICAL DATA:  Right shoulder arthroplasty. EXAM: PORTABLE RIGHT SHOULDER COMPARISON:  CT 01/31/2020. FINDINGS: Total right shoulder replacement.  Hardware intact. Anatomic alignment. No acute bony abnormality. Prior CABG. IMPRESSION: Total right shoulder replacement with anatomic alignment. Electronically Signed   By: Marcello Moores  Register   On: 06/18/2020 13:56    Assessment/Plan: Principal Problem:   Osteoarthritis of right shoulder Active Problems:   S/P reverse total shoulder arthroplasty, right  1 Day Post-Op s/p Procedure(s): TOTAL SHOULDER ARTHROPLASTY - overall doing well, nerve block has not completely worn off  Weightbearing: NWB RUE Insicional and dressing care: Dressings left intact until follow-up, patient's sling was readjusted this morning but he is a requesting a sling that fits his wrist better - will order XL sling to see if this is more comfortable for him Pain control: Originally planned for tramadol on discharge, but patient has needed norco while in the hospital, will plan to discharge with norco 5/325 Follow - up plan: in 2 weeks with Dr. Mardelle Matte Dispo: Plan is for OT to work with patient at 17 today, ok to discharge after OT if passes OT eval  Contact information:   Weekdays 8-5 Merlene Pulling, [place] [phone]  A fter hours and holidays please check [email or web address] for group call information for [facility] 06/19/2020, 8:27 AM

## 2020-06-19 NOTE — Evaluation (Signed)
Occupational Therapy Evaluation Patient Details Name: Marc Gilbert MRN: [id number] DOB: 09/09/1930 Today's Date: 06/19/2020    History of Present Illness Patient is an 87 year old male s/p R total shoulder arthroplasty. PMH includes COPD, CKD   Clinical Impression   Patient is an 87 year old male s/p shoulder replacement without functional use of right dominant upper extremity secondary to effects of surgery and interscalene block and shoulder precautions. Therapist provided education and instruction to patient and daughter in regards to exercises, precautions, positioning, donning upper extremity clothing and bathing while maintaining shoulder precautions, ice and edema management and donning/doffing sling. Patient and daughter verbalized understanding and demonstrated as needed. Patient needed assistance to donn shirt, underwear, pants, socks and shoes and provided with instruction on compensatory strategies to perform ADLs. Patient to follow up with MD for further therapy needs.      Follow Up Recommendations  Follow surgeon's recommendation for DC plan and follow-up therapies    Equipment Recommendations  None recommended by OT       Precautions / Restrictions Precautions Precautions: Shoulder Type of Shoulder Precautions: AROM elbow, wrist, hand ok NO A/PROM shoulder Shoulder Interventions: Shoulder sling/immobilizer;Off for dressing/bathing/exercises Required Braces or Orthoses: Sling Restrictions Weight Bearing Restrictions: Yes RUE Weight Bearing: Non weight bearing      Mobility Bed Mobility Overal bed mobility: Modified Independent             General bed mobility comments: bed height significantly elevated, educated pt on set up sleeping in recliner at home    Transfers Overall transfer level: Needs assistance Equipment used: None Transfers: Sit to/from Stand Sit to Stand: Supervision         General transfer comment: S for safety    Balance Overall  balance assessment: Mild deficits observed, not formally tested                                         ADL either performed or assessed with clinical judgement   ADL Overall ADL's : Needs assistance/impaired Eating/Feeding: Independent   Grooming: Independent;Standing   Upper Body Bathing: Supervision/ safety;Standing   Lower Body Bathing: Minimal assistance;Sit to/from stand;Sitting/lateral leans   Upper Body Dressing : Minimal assistance;Standing Upper Body Dressing Details (indicate cue type and reason): assist to thread L UE due to wrist bands/ IV Lower Body Dressing: Minimal assistance;Sitting/lateral leans;Sit to/from stand Lower Body Dressing Details (indicate cue type and reason): to pull up clothing over R hip Toilet Transfer: Supervision/safety;Ambulation;Regular Toilet   Toileting- Clothing Manipulation and Hygiene: Minimal assistance;Sitting/lateral lean;Sit to/from stand       Functional mobility during ADLs: Supervision/safety General ADL Comments: patient and DTR educated in compensatory strategies for ADL tasks in order to maintain shoulder precautions, verbalized understanding     Pertinent Vitals/Pain Pain Assessment: Faces Faces Pain Scale: Hurts little more Pain Location: R shoulder Pain Descriptors / Indicators: Heaviness;Numbness Pain Intervention(s): Monitored during session     Hand Dominance Right   Extremity/Trunk Assessment Upper Extremity Assessment Upper Extremity Assessment: RUE deficits/detail RUE Deficits / Details: + nerve block RUE: Unable to fully assess due to immobilization   Lower Extremity Assessment Lower Extremity Assessment: Overall WFL for tasks assessed       Communication Communication Communication: HOH   Cognition Arousal/Alertness: Awake/alert Behavior During Therapy: WFL for tasks assessed/performed Overall Cognitive Status: Within Functional Limits for tasks assessed  Exercises Exercises: Shoulder   Shoulder Instructions Shoulder Instructions Donning/doffing shirt without moving shoulder: Minimal assistance;Patient able to independently direct caregiver;Caregiver independent with task Method for sponge bathing under operated UE: Minimal assistance;Caregiver independent with task;Patient able to independently direct caregiver Donning/doffing sling/immobilizer: Maximal assistance;Caregiver independent with task;Patient able to independently direct caregiver Correct positioning of sling/immobilizer: Caregiver independent with task;Patient able to independently direct caregiver Pendulum exercises (written home exercise program):  (N/A) ROM for elbow, wrist and digits of operated UE: Patient able to independently direct caregiver;Caregiver independent with task Sling wearing schedule (on at all times/off for ADL's): Patient able to independently direct caregiver;Caregiver independent with task Proper positioning of operated UE when showering: Caregiver independent with task;Patient able to independently direct caregiver Dressing change:  (N/A) Positioning of UE while sleeping: Patient able to independently direct caregiver;Caregiver independent with task    Home Living Family/patient expects to be discharged to:: Private residence Living Arrangements: Spouse/significant other Available Help at Discharge: Family;Available 24 hours/day Type of Home: Other(Comment) (retirement community)       Home Layout: One level     Bathroom Shower/Tub: Walk-in Psychologist, prison and probation services: Handicapped height     Home Equipment: Grab bars - toilet;Grab bars - tub/shower;Shower seat - built in;Cane - single point          Prior Functioning/Environment Level of Independence: Independent                 OT Problem List: Pain;Impaired UE functional use;Decreased knowledge of precautions         OT Goals(Current goals can be found  in the care plan section) Acute Rehab OT Goals Patient Stated Goal: home OT Goal Formulation: All assessment and education complete, DC therapy   AM-PAC OT "6 Clicks" Daily Activity     Outcome Measure Help from another person eating meals?: None Help from another person taking care of personal grooming?: A Little Help from another person toileting, which includes using toliet, bedpan, or urinal?: A Little Help from another person bathing (including washing, rinsing, drying)?: A Little Help from another person to put on and taking off regular upper body clothing?: A Little Help from another person to put on and taking off regular lower body clothing?: A Little 6 Click Score: 19   End of Session Equipment Utilized During Treatment: Other (comment) (sling) Nurse Communication: Other (comment) (OT complete)  Activity Tolerance: Patient tolerated treatment well Patient left: in chair;with family/visitor present  OT Visit Diagnosis: Pain Pain - Right/Left: Right Pain - part of body: Shoulder                Time: 3235-5732 OT Time Calculation (min): 31 min Charges:  OT General Charges $OT Visit: 1 Visit OT Evaluation $OT Eval Low Complexity: 1 Low OT Treatments $Self Care/Home Management : 8-22 mins  Delbert Phenix OT OT pager: [place] 06/19/2020, 11:59 AM

## 2020-06-19 NOTE — Progress Notes (Signed)
Orthopedic Tech Progress Note Patient Details:  Marc Gilbert January 02, 1931 [id number] XL Arm Sling was left in patient's room.  Ortho Devices Type of Ortho Device: Arm sling Ortho Device/Splint Location: Right Arm Ortho Device/Splint Interventions: Ordered       Marc Gilbert [name] 06/19/2020, 9:25 AM

## 2020-06-20 NOTE — Discharge Summary (Signed)
Discharge Summary  Patient ID: Marc Gilbert MRN: [id number] DOB/AGE: [birth year] 85 y.o.  Admit date: 06/18/2020 Discharge date: 06/19/20  Admission Diagnoses:  Osteoarthritis of right shoulder  Discharge Diagnoses:  Principal Problem:   Osteoarthritis of right shoulder Active Problems:   S/P reverse total shoulder arthroplasty, right   Past Medical History:  Diagnosis Date  . Arthritis    "right hand; back" (08/30/2015)  . Cancer (HCC)    skin  squamous and basal  . Chronic lower back pain   . CKD (chronic kidney disease), stage III ([place])    Stage III  . COPD (chronic obstructive pulmonary disease) (HCC)    no home O2  . Coronary artery disease   . Dysplastic colon polyp age 24   carcinoma in situ  . GERD (gastroesophageal reflux disease)    occ  . H/O hiatal hernia   . Hyperlipidemia   . Hypertension   . Prostate cancer ([place]) 07/2014   active surveillance, [place] 6    Surgeries: Procedure(s): TOTAL SHOULDER ARTHROPLASTY on 06/18/2020   Consultants (if any):   Discharged Condition: Improved  Hospital Course: Marc Gilbert is an 87 y.o. male who was admitted 06/18/2020 with a diagnosis of Osteoarthritis of right shoulder and went to the operating room on 06/18/2020 and underwent the above named procedures.    He was given perioperative antibiotics:  Anti-infectives (From admission, onward)   Start     Dose/Rate Route Frequency Ordered Stop   06/18/20 1400  ceFAZolin (ANCEF) IVPB 2g/100 mL premix        2 g 200 mL/hr over 30 Minutes Intravenous Every 6 hours 06/18/20 1301 06/19/20 0630   06/18/20 0600  ceFAZolin (ANCEF) IVPB 2g/100 mL premix        2 g 200 mL/hr over 30 Minutes Intravenous On call to O.R. 06/18/20 0528 06/18/20 0826   06/18/20 0530  ceFAZolin (ANCEF) 2-4 GM/100ML-% IVPB       Note to Pharmacy: Randa Evens  : cabinet override      06/18/20 0530 06/18/20 0922    .  He was given sequential compression devices and early ambulation for DVT  prophylaxis.  He benefited maximally from the hospital stay and there were no complications.    Recent vital signs:  Vitals:   06/19/20 0604 06/19/20 0929  BP: 129/75 (!) 119/59  Pulse: 76 72  Resp: 16 16  Temp: 97.8 F (36.6 C) 98.6 F (37 C)  SpO2: 98% 92%    Recent laboratory studies:  Lab Results  Component Value Date   HGB 12.5 (L) 06/19/2020   HGB 14.1 06/11/2020   HGB 11.3 (L) 08/16/2016   Lab Results  Component Value Date   WBC 9.5 06/19/2020   PLT 171 06/19/2020   Lab Results  Component Value Date   INR 1.39 08/14/2016   Lab Results  Component Value Date   NA 140 06/19/2020   K 4.4 06/19/2020   CL 107 06/19/2020   CO2 26 06/19/2020   BUN 25 (H) 06/19/2020   CREATININE 0.99 06/19/2020   GLUCOSE 112 (H) 06/19/2020    Discharge Medications:   Allergies as of 06/19/2020   No Known Allergies     Medication List    STOP taking these medications   acetaminophen 500 MG tablet Commonly known as: TYLENOL     TAKE these medications   amoxicillin 500 MG tablet Commonly known as: AMOXIL Take 2,000 mg by mouth See admin instructions. Take prior to dental visits.  aspirin 81 MG tablet Take 81 mg by mouth daily.   atorvastatin 40 MG tablet Commonly known as: LIPITOR Take 1 tablet (40 mg total) by mouth daily.   diclofenac Sodium 1 % Gel Commonly known as: VOLTAREN Apply 1 application topically daily.   docusate sodium 250 MG capsule Commonly known as: COLACE Take 250 mg by mouth daily.   FISH OIL OMEGA-3 PO Take 2 capsules by mouth daily. 980 mg per cap   HYDROcodone-acetaminophen 5-325 MG tablet Commonly known as: Norco Take 1 tablet by mouth every 6 (six) hours as needed for moderate pain. MAXIMUM TOTAL ACETAMINOPHEN DOSE IS 4000 MG PER DAY   metoprolol succinate 50 MG 24 hr tablet Commonly known as: TOPROL-XL TAKE 1 TABLET 2 TIMES DAILY. TAKE WITH OR IMMEDIATELY FOLLOWING A MEAL. What changed:   how much to take  how to take  this  when to take this   ondansetron 4 MG tablet Commonly known as: Zofran Take 1 tablet (4 mg total) by mouth every 8 (eight) hours as needed for nausea or vomiting.   One-A-Day Mens 50+ Advantage Tabs Take 1 tablet by mouth daily with breakfast.   polyethylene glycol 17 g packet Commonly known as: MIRALAX / GLYCOLAX Take 8.5 g by mouth daily.   tamsulosin 0.4 MG Caps capsule Commonly known as: FLOMAX Take 0.4 mg by mouth in the morning and at bedtime. Reports taking one tablet at night then one in the morning.       Diagnostic Studies: DG Shoulder Right Port  Result Date: 06/18/2020 CLINICAL DATA:  Right shoulder arthroplasty. EXAM: PORTABLE RIGHT SHOULDER COMPARISON:  CT 01/31/2020. FINDINGS: Total right shoulder replacement. Hardware intact. Anatomic alignment. No acute bony abnormality. Prior CABG. IMPRESSION: Total right shoulder replacement with anatomic alignment. Electronically Signed   By: Marcello Moores  Register   On: 06/18/2020 13:56    Disposition: Discharge disposition: 01-Home or Self Care          Follow-up Information    Marchia Bond, MD. Schedule an appointment as soon as possible for a visit in 2 weeks.   Specialty: Orthopedic Surgery Contact information: [address] [place] [address] [phone]                Signed: Jola Baptist 06/20/2020, 1:06 PM

## 2020-06-21 ENCOUNTER — Encounter (HOSPITAL_COMMUNITY): Payer: Self-pay | Admitting: Orthopedic Surgery

## 2020-06-24 DIAGNOSIS — N401 Enlarged prostate with lower urinary tract symptoms: Secondary | ICD-10-CM | POA: Diagnosis not present

## 2020-06-24 DIAGNOSIS — C61 Malignant neoplasm of prostate: Secondary | ICD-10-CM | POA: Diagnosis not present

## 2020-06-24 DIAGNOSIS — R351 Nocturia: Secondary | ICD-10-CM | POA: Diagnosis not present

## 2020-06-30 ENCOUNTER — Ambulatory Visit (HOSPITAL_COMMUNITY)
Admission: RE | Admit: 2020-06-30 | Discharge: 2020-06-30 | Disposition: A | Discharge status: Home / Self Care | Payer: Medicare Other | Source: Ambulatory Visit | Attending: Physician Assistant | Admitting: Physician Assistant

## 2020-06-30 ENCOUNTER — Other Ambulatory Visit: Payer: Self-pay

## 2020-06-30 ENCOUNTER — Other Ambulatory Visit: Payer: Self-pay | Admitting: Physician Assistant

## 2020-06-30 DIAGNOSIS — M79604 Pain in right leg: Secondary | ICD-10-CM

## 2020-06-30 NOTE — Progress Notes (Signed)
VASCULAR LAB    Right lower extremity venous duplex has been performed.  See CV proc for preliminary results.  Called Kirstin Arcadia, PA-C with results  Briauna Gilmartin, RVT 06/30/2020, 2:50 PM

## 2020-07-01 DIAGNOSIS — I82451 Acute embolism and thrombosis of right peroneal vein: Secondary | ICD-10-CM | POA: Diagnosis not present

## 2020-07-01 DIAGNOSIS — E7800 Pure hypercholesterolemia, unspecified: Secondary | ICD-10-CM | POA: Diagnosis not present

## 2020-07-01 DIAGNOSIS — K5901 Slow transit constipation: Secondary | ICD-10-CM | POA: Diagnosis not present

## 2020-07-01 DIAGNOSIS — F5101 Primary insomnia: Secondary | ICD-10-CM | POA: Diagnosis not present

## 2020-07-01 DIAGNOSIS — Z79899 Other long term (current) drug therapy: Secondary | ICD-10-CM | POA: Diagnosis not present

## 2020-07-01 DIAGNOSIS — M19011 Primary osteoarthritis, right shoulder: Secondary | ICD-10-CM | POA: Diagnosis not present

## 2020-07-01 DIAGNOSIS — N1831 Chronic kidney disease, stage 3a: Secondary | ICD-10-CM | POA: Diagnosis not present

## 2020-07-01 DIAGNOSIS — I700 Atherosclerosis of aorta: Secondary | ICD-10-CM | POA: Diagnosis not present

## 2020-07-01 DIAGNOSIS — Z0000 Encounter for general adult medical examination without abnormal findings: Secondary | ICD-10-CM | POA: Diagnosis not present

## 2020-07-01 DIAGNOSIS — J449 Chronic obstructive pulmonary disease, unspecified: Secondary | ICD-10-CM | POA: Diagnosis not present

## 2020-07-01 DIAGNOSIS — I129 Hypertensive chronic kidney disease with stage 1 through stage 4 chronic kidney disease, or unspecified chronic kidney disease: Secondary | ICD-10-CM | POA: Diagnosis not present

## 2020-07-01 DIAGNOSIS — Z77090 Contact with and (suspected) exposure to asbestos: Secondary | ICD-10-CM | POA: Diagnosis not present

## 2020-07-01 DIAGNOSIS — Z1389 Encounter for screening for other disorder: Secondary | ICD-10-CM | POA: Diagnosis not present

## 2020-07-02 ENCOUNTER — Other Ambulatory Visit: Payer: Self-pay

## 2020-07-02 ENCOUNTER — Ambulatory Visit (INDEPENDENT_AMBULATORY_CARE_PROVIDER_SITE_OTHER): Payer: Medicare Other | Admitting: Cardiovascular Disease

## 2020-07-02 ENCOUNTER — Encounter: Payer: Self-pay | Admitting: Cardiovascular Disease

## 2020-07-02 VITALS — BP 130/70 | HR 80 | Ht 71.0 in | Wt 216.0 lb

## 2020-07-02 DIAGNOSIS — I82451 Acute embolism and thrombosis of right peroneal vein: Secondary | ICD-10-CM | POA: Diagnosis not present

## 2020-07-02 DIAGNOSIS — I82409 Acute embolism and thrombosis of unspecified deep veins of unspecified lower extremity: Secondary | ICD-10-CM | POA: Insufficient documentation

## 2020-07-02 DIAGNOSIS — I2510 Atherosclerotic heart disease of native coronary artery without angina pectoris: Secondary | ICD-10-CM

## 2020-07-02 MED ORDER — ELIQUIS 5 MG PO TABS: 5.0000 mg | ORAL_TABLET | Freq: Two times a day (BID) | ORAL | 1 refills | Status: DC

## 2020-07-02 NOTE — Progress Notes (Signed)
07/02/2020 Marc Gilbert   Oct 13, 1930  [id number]  Primary Physician Lajean Manes, MD Primary Cardiologist: Lorretta Harp MD Lupe Carney, [place]  HPI:  Marc Gilbert is a 87 y.o. mild to moderately overweight married Caucasian male father of 3 children, grandfather of 6 grandchildren who is accompanied by his wife Tomi Bamberger today.  He is a cardiology patient of Dr. Rosezella [place].  He does have a history of CAD status post CABG times 26/29/18 with a LIMA to his LAD and a vein to an obtuse marginal branch.  He has treated hypertension and hyperlipidemia.  He had right shoulder arthroplasty by Dr. Mardelle Matte on 06/18/2020.  This past Thursday he developed right foot pain which progressed to swelling of his right lower extremity and discoloration.  He was seen in the emergency room where a venous Doppler showed a right peroneal vein DVT.  He was begun on Eliquis oral anticoagulation.  He said symptoms have improved and the swelling is slowly resolving.   Current Meds  Medication Sig  . amoxicillin (AMOXIL) 500 MG tablet Take 2,000 mg by mouth See admin instructions. Take prior to dental visits.  Marland Kitchen aspirin 81 MG tablet Take 81 mg by mouth daily.  Marland Kitchen atorvastatin (LIPITOR) 40 MG tablet Take 1 tablet (40 mg total) by mouth daily.  . diclofenac Sodium (VOLTAREN) 1 % GEL Apply 1 application topically daily.  Marland Kitchen docusate sodium (COLACE) 250 MG capsule Take 250 mg by mouth daily.  Marland Kitchen ELIQUIS 5 MG TABS tablet Take by mouth.  Marland Kitchen HYDROcodone-acetaminophen (NORCO) 5-325 MG tablet Take 1 tablet by mouth every 6 (six) hours as needed for moderate pain. MAXIMUM TOTAL ACETAMINOPHEN DOSE IS 4000 MG PER DAY  . melatonin 3 MG TABS tablet 1 tablet at bedtime as needed  . metoprolol succinate (TOPROL-XL) 50 MG 24 hr tablet TAKE 1 TABLET 2 TIMES DAILY. TAKE WITH OR IMMEDIATELY FOLLOWING A MEAL. (Patient taking differently: Take 50 mg by mouth in the morning and at bedtime. TAKE 1 TABLET 2 TIMES DAILY. TAKE WITH OR  IMMEDIATELY FOLLOWING A MEAL.)  . Multiple Vitamins-Minerals (ONE-A-DAY MENS 50+ ADVANTAGE) TABS Take 1 tablet by mouth daily with breakfast.  . Omega-3 Fatty Acids (FISH OIL OMEGA-3 PO) Take 2 capsules by mouth daily. 980 mg per cap  . ondansetron (ZOFRAN) 4 MG tablet Take 1 tablet (4 mg total) by mouth every 8 (eight) hours as needed for nausea or vomiting.  Marland Kitchen oxybutynin (DITROPAN) 5 MG tablet Take 5 mg by mouth daily.  . polyethylene glycol (MIRALAX / GLYCOLAX) 17 g packet Take 8.5 g by mouth daily.  . Tamsulosin HCl (FLOMAX) 0.4 MG CAPS Take 0.4 mg by mouth in the morning and at bedtime. Reports taking one tablet at night then one in the morning.     No Known Allergies  Social History   Socioeconomic History  . Marital status: Married    Spouse name: Not on file  . Number of children: Not on file  . Years of education: Not on file  . Highest education level: Not on file  Occupational History  . Occupation: retired 1996  Tobacco Use  . Smoking status: Former Smoker    Packs/day: 0.50    Years: 25.00    Pack years: 12.50    Types: Cigarettes    Quit date: 05/24/1988    Years since quitting: 32.1  . Smokeless tobacco: Never Used  Vaping Use  . Vaping Use: Never used  Substance and Sexual  Activity  . Alcohol use: Yes    Alcohol/week: 10.0 standard drinks    Types: 5 Glasses of wine, 5 Cans of beer per week    Comment: daily  . Drug use: No  . Sexual activity: Not Currently  Other Topics Concern  . Not on file  Social History Narrative  . Not on file   Social Determinants of Health   Financial Resource Strain: Not on file  Food Insecurity: Not on file  Transportation Needs: Not on file  Physical Activity: Not on file  Stress: Not on file  Social Connections: Not on file  Intimate Partner Violence: Not on file     Review of Systems: General: negative for chills, fever, night sweats or weight changes.  Cardiovascular: negative for chest pain, dyspnea on exertion,  edema, orthopnea, palpitations, paroxysmal nocturnal dyspnea or shortness of breath Dermatological: negative for rash Respiratory: negative for cough or wheezing Urologic: negative for hematuria Abdominal: negative for nausea, vomiting, diarrhea, bright red blood per rectum, melena, or hematemesis Neurologic: negative for visual changes, syncope, or dizziness All other systems reviewed and are otherwise negative except as noted above.    Blood pressure 130/70, pulse 80, height 5\' 11"  (1.803 m), weight 216 lb (98 kg), SpO2 94 %.  General appearance: alert and no distress Neck: no adenopathy, no carotid bruit, no JVD, supple, symmetrical, trachea midline and thyroid not enlarged, symmetric, no tenderness/mass/nodules Lungs: clear to auscultation bilaterally Heart: regular rate and rhythm, S1, S2 normal, no murmur, click, rub or gallop Extremities: extremities normal, atraumatic, no cyanosis or edema Pulses: 2+ and symmetric Skin: Skin color, texture, turgor normal. No rashes or lesions Neurologic: Alert and oriented X 3, normal strength and tone. Normal symmetric reflexes. Normal coordination and gait  EKG sinus rhythm at 80 with right bundle branch block and low limb voltage.  I personally reviewed this EKG.  ASSESSMENT AND PLAN:   DVT (deep venous thrombosis) [facility]) Mr. Malkiewicz and right total shoulder replacement by Dr. Mardelle Matte on 06/18/2020.  He was discharged home the following day.  This past Thursday he developed pain in his right foot which progressed into his calf over the weekend.  He was seen in the emergency room where venous Doppler showed peroneal vein DVT on the right.  He was started on Eliquis oral anticoagulation.  His symptoms have mildly improved.  I am going to keep him on his Eliquis for 3 months and then discontinue this.  We will hold his aspirin while he is on his Eliquis.  I am going to repeat his venous Doppler studies in 3 months.      Lorretta Harp MD  FACP,FACC,FAHA, [facility] 07/02/2020 11:49 AM

## 2020-07-02 NOTE — Patient Instructions (Signed)
Medication Instructions:   -Stop taking aspirin while on eluquis. May resume once eliquis has been stopped.  *If you need a refill on your cardiac medications before your next appointment, please call your pharmacy*   Testing/Procedures: Your physician has requested that you have a lower extremity venous duplex. This test is an ultrasound of the veins in the legs. It looks at venous blood flow that carries blood from the heart to the legs or arms. Allow one hour for a Lower Venous exam. There are no restrictions or special instructions. This procedure is done at [place]. 2nd Floor   Follow-Up: At [facility], you and your health needs are our priority.  As part of our continuing mission to provide you with exceptional heart care, we have created designated Provider Care Teams.  These Care Teams include your primary Cardiologist (physician) and Advanced Practice Providers (APPs -  Physician Assistants and Nurse Practitioners) who all work together to provide you with the care you need, when you need it.  We recommend signing up for the patient portal called "MyChart".  Sign up information is provided on this After Visit Summary.  MyChart is used to connect with patients for Virtual Visits (Telemedicine).  Patients are able to view lab/test results, encounter notes, upcoming appointments, etc.  Non-urgent messages can be sent to your provider as well.   To learn more about what you can do with MyChart, go to [email or web address].    Your next appointment:   3 month(s)  The format for your next appointment:   In Person  Provider:   Minus Breeding, MD   Other Instructions Dr. Gwenlyn Found will see you on an as needed basis.

## 2020-07-02 NOTE — Assessment & Plan Note (Signed)
Marc Gilbert and right total shoulder replacement by Dr. Mardelle Matte on 06/18/2020.  He was discharged home the following day.  This past Thursday he developed pain in his right foot which progressed into his calf over the weekend.  He was seen in the emergency room where venous Doppler showed peroneal vein DVT on the right.  He was started on Eliquis oral anticoagulation.  His symptoms have mildly improved.  I am going to keep him on his Eliquis for 3 months and then discontinue this.  We will hold his aspirin while he is on his Eliquis.  I am going to repeat his venous Doppler studies in 3 months.

## 2020-07-10 DIAGNOSIS — D225 Melanocytic nevi of trunk: Secondary | ICD-10-CM | POA: Diagnosis not present

## 2020-07-10 DIAGNOSIS — L570 Actinic keratosis: Secondary | ICD-10-CM | POA: Diagnosis not present

## 2020-07-10 DIAGNOSIS — D485 Neoplasm of uncertain behavior of skin: Secondary | ICD-10-CM | POA: Diagnosis not present

## 2020-07-10 DIAGNOSIS — L821 Other seborrheic keratosis: Secondary | ICD-10-CM | POA: Diagnosis not present

## 2020-07-10 DIAGNOSIS — Z86018 Personal history of other benign neoplasm: Secondary | ICD-10-CM | POA: Diagnosis not present

## 2020-07-10 DIAGNOSIS — Z85828 Personal history of other malignant neoplasm of skin: Secondary | ICD-10-CM | POA: Diagnosis not present

## 2020-07-10 DIAGNOSIS — L578 Other skin changes due to chronic exposure to nonionizing radiation: Secondary | ICD-10-CM | POA: Diagnosis not present

## 2020-07-10 DIAGNOSIS — C4442 Squamous cell carcinoma of skin of scalp and neck: Secondary | ICD-10-CM | POA: Diagnosis not present

## 2020-07-31 DIAGNOSIS — M19011 Primary osteoarthritis, right shoulder: Secondary | ICD-10-CM | POA: Diagnosis not present

## 2020-08-06 DIAGNOSIS — M25511 Pain in right shoulder: Secondary | ICD-10-CM | POA: Diagnosis not present

## 2020-08-06 DIAGNOSIS — Z4789 Encounter for other orthopedic aftercare: Secondary | ICD-10-CM | POA: Diagnosis not present

## 2020-08-08 DIAGNOSIS — Z4789 Encounter for other orthopedic aftercare: Secondary | ICD-10-CM | POA: Diagnosis not present

## 2020-08-08 DIAGNOSIS — M25511 Pain in right shoulder: Secondary | ICD-10-CM | POA: Diagnosis not present

## 2020-08-12 DIAGNOSIS — M25511 Pain in right shoulder: Secondary | ICD-10-CM | POA: Diagnosis not present

## 2020-08-12 DIAGNOSIS — Z4789 Encounter for other orthopedic aftercare: Secondary | ICD-10-CM | POA: Diagnosis not present

## 2020-08-13 ENCOUNTER — Telehealth: Payer: Self-pay | Admitting: Cardiology

## 2020-08-13 DIAGNOSIS — Z4789 Encounter for other orthopedic aftercare: Secondary | ICD-10-CM | POA: Diagnosis not present

## 2020-08-13 DIAGNOSIS — M25511 Pain in right shoulder: Secondary | ICD-10-CM | POA: Diagnosis not present

## 2020-08-13 NOTE — Telephone Encounter (Signed)
Created in error

## 2020-08-15 DIAGNOSIS — Z4789 Encounter for other orthopedic aftercare: Secondary | ICD-10-CM | POA: Diagnosis not present

## 2020-08-15 DIAGNOSIS — M25511 Pain in right shoulder: Secondary | ICD-10-CM | POA: Diagnosis not present

## 2020-08-16 DIAGNOSIS — C4442 Squamous cell carcinoma of skin of scalp and neck: Secondary | ICD-10-CM | POA: Diagnosis not present

## 2020-08-20 DIAGNOSIS — M25511 Pain in right shoulder: Secondary | ICD-10-CM | POA: Diagnosis not present

## 2020-08-20 DIAGNOSIS — Z4789 Encounter for other orthopedic aftercare: Secondary | ICD-10-CM | POA: Diagnosis not present

## 2020-08-22 DIAGNOSIS — Z4789 Encounter for other orthopedic aftercare: Secondary | ICD-10-CM | POA: Diagnosis not present

## 2020-08-22 DIAGNOSIS — M25511 Pain in right shoulder: Secondary | ICD-10-CM | POA: Diagnosis not present

## 2020-08-25 DIAGNOSIS — Z20822 Contact with and (suspected) exposure to covid-19: Secondary | ICD-10-CM | POA: Diagnosis not present

## 2020-08-26 ENCOUNTER — Telehealth: Payer: Self-pay

## 2020-08-26 DIAGNOSIS — Z4789 Encounter for other orthopedic aftercare: Secondary | ICD-10-CM | POA: Diagnosis not present

## 2020-08-26 DIAGNOSIS — M25511 Pain in right shoulder: Secondary | ICD-10-CM | POA: Diagnosis not present

## 2020-08-26 NOTE — Telephone Encounter (Signed)
See My Chart Message.

## 2020-08-26 NOTE — Telephone Encounter (Signed)
This RN called pt's wife Everet Flagg (ok per DPR) per pt request. Pt's wife said she and her husband share an email address and she believes his results are coming through to her MyChart account. Pt's wife not sure if email alerts are coming to their shared email account or if actual data is in the wrong MyChart account. Pt's wife said she was late for a meeting, so she will speak with her husband tonight to try to figure out the problem and will call back in the morning.

## 2020-08-27 DIAGNOSIS — M25511 Pain in right shoulder: Secondary | ICD-10-CM | POA: Diagnosis not present

## 2020-08-27 DIAGNOSIS — Z4789 Encounter for other orthopedic aftercare: Secondary | ICD-10-CM | POA: Diagnosis not present

## 2020-08-28 DIAGNOSIS — M19011 Primary osteoarthritis, right shoulder: Secondary | ICD-10-CM | POA: Diagnosis not present

## 2020-08-29 ENCOUNTER — Other Ambulatory Visit: Payer: Self-pay

## 2020-08-29 DIAGNOSIS — M25511 Pain in right shoulder: Secondary | ICD-10-CM | POA: Diagnosis not present

## 2020-08-29 DIAGNOSIS — Z4789 Encounter for other orthopedic aftercare: Secondary | ICD-10-CM | POA: Diagnosis not present

## 2020-08-29 MED ORDER — ELIQUIS 5 MG PO TABS: 5.0000 mg | ORAL_TABLET | Freq: Two times a day (BID) | ORAL | 1 refills | Status: DC

## 2020-08-29 NOTE — Telephone Encounter (Signed)
15m, 98kg, scr 0.99 06/19/20, lovw/berry 07/02/20

## 2020-09-02 DIAGNOSIS — Z4789 Encounter for other orthopedic aftercare: Secondary | ICD-10-CM | POA: Diagnosis not present

## 2020-09-02 DIAGNOSIS — M25511 Pain in right shoulder: Secondary | ICD-10-CM | POA: Diagnosis not present

## 2020-09-03 DIAGNOSIS — Z4789 Encounter for other orthopedic aftercare: Secondary | ICD-10-CM | POA: Diagnosis not present

## 2020-09-03 DIAGNOSIS — M25511 Pain in right shoulder: Secondary | ICD-10-CM | POA: Diagnosis not present

## 2020-09-05 DIAGNOSIS — M25511 Pain in right shoulder: Secondary | ICD-10-CM | POA: Diagnosis not present

## 2020-09-05 DIAGNOSIS — Z4789 Encounter for other orthopedic aftercare: Secondary | ICD-10-CM | POA: Diagnosis not present

## 2020-09-09 DIAGNOSIS — Z4789 Encounter for other orthopedic aftercare: Secondary | ICD-10-CM | POA: Diagnosis not present

## 2020-09-09 DIAGNOSIS — M25511 Pain in right shoulder: Secondary | ICD-10-CM | POA: Diagnosis not present

## 2020-09-11 DIAGNOSIS — Z4789 Encounter for other orthopedic aftercare: Secondary | ICD-10-CM | POA: Diagnosis not present

## 2020-09-11 DIAGNOSIS — M25511 Pain in right shoulder: Secondary | ICD-10-CM | POA: Diagnosis not present

## 2020-09-13 DIAGNOSIS — Z4789 Encounter for other orthopedic aftercare: Secondary | ICD-10-CM | POA: Diagnosis not present

## 2020-09-13 DIAGNOSIS — M25511 Pain in right shoulder: Secondary | ICD-10-CM | POA: Diagnosis not present

## 2020-09-16 DIAGNOSIS — Z4789 Encounter for other orthopedic aftercare: Secondary | ICD-10-CM | POA: Diagnosis not present

## 2020-09-16 DIAGNOSIS — M25511 Pain in right shoulder: Secondary | ICD-10-CM | POA: Diagnosis not present

## 2020-09-18 DIAGNOSIS — Z4789 Encounter for other orthopedic aftercare: Secondary | ICD-10-CM | POA: Diagnosis not present

## 2020-09-18 DIAGNOSIS — M25511 Pain in right shoulder: Secondary | ICD-10-CM | POA: Diagnosis not present

## 2020-09-20 DIAGNOSIS — M25511 Pain in right shoulder: Secondary | ICD-10-CM | POA: Diagnosis not present

## 2020-09-20 DIAGNOSIS — Z4789 Encounter for other orthopedic aftercare: Secondary | ICD-10-CM | POA: Diagnosis not present

## 2020-09-25 DIAGNOSIS — M25511 Pain in right shoulder: Secondary | ICD-10-CM | POA: Diagnosis not present

## 2020-09-25 DIAGNOSIS — Z4789 Encounter for other orthopedic aftercare: Secondary | ICD-10-CM | POA: Diagnosis not present

## 2020-09-27 DIAGNOSIS — Z4789 Encounter for other orthopedic aftercare: Secondary | ICD-10-CM | POA: Diagnosis not present

## 2020-09-27 DIAGNOSIS — M25511 Pain in right shoulder: Secondary | ICD-10-CM | POA: Diagnosis not present

## 2020-09-30 DIAGNOSIS — Z4789 Encounter for other orthopedic aftercare: Secondary | ICD-10-CM | POA: Diagnosis not present

## 2020-09-30 DIAGNOSIS — M25511 Pain in right shoulder: Secondary | ICD-10-CM | POA: Diagnosis not present

## 2020-10-01 ENCOUNTER — Ambulatory Visit (HOSPITAL_COMMUNITY)
Admission: RE | Admit: 2020-10-01 | Discharge: 2020-10-01 | Disposition: A | Discharge status: Home / Self Care | Payer: Medicare Other | Source: Ambulatory Visit | Attending: Cardiology | Admitting: Cardiology

## 2020-10-01 ENCOUNTER — Other Ambulatory Visit: Payer: Self-pay

## 2020-10-01 DIAGNOSIS — I82451 Acute embolism and thrombosis of right peroneal vein: Secondary | ICD-10-CM | POA: Diagnosis not present

## 2020-10-02 DIAGNOSIS — M25511 Pain in right shoulder: Secondary | ICD-10-CM | POA: Diagnosis not present

## 2020-10-02 DIAGNOSIS — Z4789 Encounter for other orthopedic aftercare: Secondary | ICD-10-CM | POA: Diagnosis not present

## 2020-10-03 ENCOUNTER — Telehealth: Payer: Self-pay | Admitting: *Deleted

## 2020-10-03 NOTE — Telephone Encounter (Signed)
Spoke with pt, Aware of dr berry's recommendations.

## 2020-10-03 NOTE — Telephone Encounter (Signed)
-----   Message from Lorretta Harp, MD sent at 10/02/2020  6:53 AM EDT ----- Regarding: RE: RT LEG VENOUS Thx Alecia. Khylee Algeo, [place] to DC Eliquis.  JJB ----- Message ----- From: Cira Servant, RVT Sent: 10/01/2020   3:36 PM EDT To: Lorretta Harp, MD Subject: RT LEG VENOUS                                  Hi,  His right peroneal vein DVT has completely resolved.   Large Baker's cyst measuring over 5.6 x 4.5 cm that was not previously noted.   Told him the results and sent him home.  Thanks, Estill Batten

## 2020-10-04 DIAGNOSIS — M25511 Pain in right shoulder: Secondary | ICD-10-CM | POA: Diagnosis not present

## 2020-10-04 DIAGNOSIS — Z4789 Encounter for other orthopedic aftercare: Secondary | ICD-10-CM | POA: Diagnosis not present

## 2020-10-04 NOTE — Telephone Encounter (Signed)
  Lorretta Harp, MD  You 2 days ago   Yes, DC Eliquis and begin ASA 81 mg/day     Pt made aware via mychart.

## 2020-10-07 DIAGNOSIS — Z4789 Encounter for other orthopedic aftercare: Secondary | ICD-10-CM | POA: Diagnosis not present

## 2020-10-07 DIAGNOSIS — M25511 Pain in right shoulder: Secondary | ICD-10-CM | POA: Diagnosis not present

## 2020-10-07 NOTE — Progress Notes (Signed)
Cardiology Office Note   Date:  10/08/2020   ID:  [place], [place] Nov 21, 1930, MRN [id number]  PCP:  Lajean Manes, MD  Cardiologist:   Minus Breeding, MD    Chief Complaint  Patient presents with   Coronary Artery Disease       History of Present Illness: Marc Gilbert is a 87 y.o. male who presents for follow up of CAD/CABG.    He had CABG 08/14/2016 w/ LIMA-LAD and SVG-OM.   Since I last saw him he had a DVT and has been treated with anticoagulation.  He has otherwise done well.  He lives at [place] [facility] and he does physical therapy on his shoulder 3 times per week.  He is doing exercise classes there very low level but keep him moving. The patient denies any new symptoms such as chest discomfort, neck or arm discomfort. There has been no new shortness of breath, PND or orthopnea. There have been no reported palpitations, presyncope or syncope.    Past Medical History:  Diagnosis Date   Arthritis    "right hand; back" (08/30/2015)   Cancer (HCC)    skin  squamous and basal   Chronic lower back pain    CKD (chronic kidney disease), stage III (HCC)    Stage III   COPD (chronic obstructive pulmonary disease) (HCC)    no home O2   Coronary artery disease    Dysplastic colon polyp age 29   carcinoma in situ   GERD (gastroesophageal reflux disease)    occ   H/O hiatal hernia    Hyperlipidemia    Hypertension    Prostate cancer ([place]) 07/2014   active surveillance, Glisson 6    Past Surgical History:  Procedure Laterality Date   CATARACT EXTRACTION W/ INTRAOCULAR LENS IMPLANT Left 08/27/15   CORONARY ARTERY BYPASS GRAFT N/A 08/14/2016   Procedure: CORONARY ARTERY BYPASS GRAFTING (CABG)x2, using left internal mammary artery and right greater saphenous vein harvested endoscopically;  Surgeon: Gaye Pollack, MD;  Location: [facility];  Service: Open Heart Surgery;  Laterality: N/A;   JOINT REPLACEMENT     LEFT HEART CATH AND CORONARY ANGIOGRAPHY N/A 08/11/2016   Procedure:  Left Heart Cath and Coronary Angiography;  Surgeon: Belva Crome, MD;  Location: [facility];  Service: Cardiovascular;  Laterality: N/A;   MOHS SURGERY     multiple SCC   PROSTATE BIOPSY  07/2014   TEE WITHOUT CARDIOVERSION N/A 08/14/2016   Procedure: TRANSESOPHAGEAL ECHOCARDIOGRAM (TEE);  Surgeon: Gaye Pollack, MD;  Location: [facility];  Service: Open Heart Surgery;  Laterality: N/A;   TONSILLECTOMY     TOTAL HIP ARTHROPLASTY  05/26/2011   Procedure: TOTAL HIP ARTHROPLASTY;  Surgeon: Garald Balding, MD;  Location: [facility];  Service: Orthopedics;  Laterality: Right;   TOTAL SHOULDER ARTHROPLASTY Right 06/18/2020   Procedure: TOTAL SHOULDER ARTHROPLASTY;  Surgeon: Marchia Bond, MD;  Location: [facility];  Service: Orthopedics;  Laterality: Right;   ULTRASOUND GUIDANCE FOR VASCULAR ACCESS  08/11/2016   Procedure: Ultrasound Guidance For Vascular Access;  Surgeon: Belva Crome, MD;  Location: [facility];  Service: Cardiovascular;;     Current Outpatient Medications  Medication Sig Dispense Refill   amoxicillin (AMOXIL) 500 MG tablet Take 2,000 mg by mouth See admin instructions. Take prior to dental visits.     aspirin 81 MG tablet Take 81 mg by mouth daily.     atorvastatin (LIPITOR) 40 MG tablet Take 1  tablet (40 mg total) by mouth daily. 90 tablet 3   diclofenac Sodium (VOLTAREN) 1 % GEL Apply 1 application topically daily.     docusate sodium (COLACE) 250 MG capsule Take 250 mg by mouth daily.     HYDROcodone-acetaminophen (NORCO) 5-325 MG tablet Take 1 tablet by mouth every 6 (six) hours as needed for moderate pain. MAXIMUM TOTAL ACETAMINOPHEN DOSE IS 4000 MG PER DAY 20 tablet 0   melatonin 3 MG TABS tablet 1 tablet at bedtime as needed     metoprolol succinate (TOPROL-XL) 50 MG 24 hr tablet TAKE 1 TABLET 2 TIMES DAILY. TAKE WITH OR IMMEDIATELY FOLLOWING A MEAL. (Patient taking differently: Take 50 mg by mouth in the morning and at bedtime. TAKE 1 TABLET 2 TIMES DAILY. TAKE WITH  OR IMMEDIATELY FOLLOWING A MEAL.) 180 tablet 3   Multiple Vitamins-Minerals (ONE-A-DAY MENS 50+ ADVANTAGE) TABS Take 1 tablet by mouth daily with breakfast.     Omega-3 Fatty Acids (FISH OIL OMEGA-3 PO) Take 2 capsules by mouth daily. 980 mg per cap     oxybutynin (DITROPAN) 5 MG tablet Take 5 mg by mouth daily.     polyethylene glycol (MIRALAX / GLYCOLAX) 17 g packet Take 8.5 g by mouth daily.     Tamsulosin HCl (FLOMAX) 0.4 MG CAPS Take 0.4 mg by mouth in the morning and at bedtime. Reports taking one tablet at night then one in the morning.     No current facility-administered medications for this visit.    Allergies:   Patient has no known allergies.    ROS:  Please see the history of present illness.   Otherwise, review of systems are positive for none.   All other systems are reviewed and negative.    PHYSICAL EXAM: VS:  BP 124/62   Pulse 80   Ht 6' (1.829 m)   Wt 207 lb 9.6 oz (94.2 kg)   SpO2 96%   BMI 28.16 kg/m  , BMI Body mass index is 28.16 kg/m. GENERAL:  Well appearing NECK:  No jugular venous distention, waveform within normal limits, carotid upstroke brisk and symmetric, no bruits, no thyromegaly LUNGS:  Clear to auscultation bilaterally CHEST:  Well healed sternotomy scar. HEART:  PMI not displaced or sustained,S1 and S2 within normal limits, no S3, no S4, no clicks, no rubs, no murmurs ABD:  Flat, positive bowel sounds normal in frequency in pitch, no bruits, no rebound, no guarding, no midline pulsatile mass, no hepatomegaly, no splenomegaly EXT:  2 plus pulses throughout, no edema, no cyanosis no clubbing   EKG:  EKG is not ordered today.    Recent Labs: 06/19/2020: BUN 25; Creatinine, Ser 0.99; Hemoglobin 12.5; Platelets 171; Potassium 4.4; Sodium 140   Wt Readings from Last 3 Encounters:  10/08/20 207 lb 9.6 oz (94.2 kg)  07/02/20 216 lb (98 kg)  06/18/20 209 lb (94.8 kg)      Other studies Reviewed: Additional studies/ records that were reviewed  today include: Labs Review of the above records demonstrates: See elsewhere   ASSESSMENT AND PLAN:   CAD:  The patient has no new sypmtoms.  No further cardiovascular testing is indicated.  We will continue with aggressive risk reduction and meds as listed.  HTN: The blood pressure is target.  No change in therapy.  DYSLIPIDEMIA:    LDL was 54 with an HDL of 51.  No change in therapy.   DVT: He completed therapy and actually had a follow-up ultrasound which showed complete  resolution of his thrombus.  Current medicines are reviewed at length with the patient today.  The patient does not have concerns regarding medicines.  The following changes have been made: None  Labs/ tests ordered today include: None  No orders of the defined types were placed in this encounter.    Disposition:   FU with me in 12 months.     Signed, Minus Breeding, MD  10/08/2020 1:45 PM    [facility]

## 2020-10-08 ENCOUNTER — Ambulatory Visit (INDEPENDENT_AMBULATORY_CARE_PROVIDER_SITE_OTHER): Payer: Medicare Other | Admitting: Cardiology

## 2020-10-08 ENCOUNTER — Other Ambulatory Visit: Payer: Self-pay

## 2020-10-08 ENCOUNTER — Encounter: Payer: Self-pay | Admitting: Cardiology

## 2020-10-08 VITALS — BP 124/62 | HR 80 | Ht 72.0 in | Wt 207.6 lb

## 2020-10-08 DIAGNOSIS — I2510 Atherosclerotic heart disease of native coronary artery without angina pectoris: Secondary | ICD-10-CM | POA: Diagnosis not present

## 2020-10-08 DIAGNOSIS — I10 Essential (primary) hypertension: Secondary | ICD-10-CM | POA: Diagnosis not present

## 2020-10-08 DIAGNOSIS — E785 Hyperlipidemia, unspecified: Secondary | ICD-10-CM | POA: Diagnosis not present

## 2020-10-08 DIAGNOSIS — I82451 Acute embolism and thrombosis of right peroneal vein: Secondary | ICD-10-CM | POA: Diagnosis not present

## 2020-10-08 NOTE — Patient Instructions (Signed)
Medication Instructions:  Your physician recommends that you continue on your current medications as directed. Please refer to the Current Medication list given to you today.  *If you need a refill on your cardiac medications before your next appointment, please call your pharmacy*  Follow-Up: At [facility], you and your health needs are our priority.  As part of our continuing mission to provide you with exceptional heart care, we have created designated Provider Care Teams.  These Care Teams include your primary Cardiologist (physician) and Advanced Practice Providers (APPs -  Physician Assistants and Nurse Practitioners) who all work together to provide you with the care you need, when you need it.  We recommend signing up for the patient portal called "MyChart".  Sign up information is provided on this After Visit Summary.  MyChart is used to connect with patients for Virtual Visits (Telemedicine).  Patients are able to view lab/test results, encounter notes, upcoming appointments, etc.  Non-urgent messages can be sent to your provider as well.   To learn more about what you can do with MyChart, go to [email or web address].    Your next appointment:   12 month(s)  The format for your next appointment:   In Person  Provider:   You may see Dr. Percival Spanish or one of the following Advanced Practice Providers on your designated Care Team:   [name], PA-C Caron Presume, PA-C Jory Sims, DNP, ANP   Other Instructions

## 2020-10-09 DIAGNOSIS — C44219 Basal cell carcinoma of skin of left ear and external auricular canal: Secondary | ICD-10-CM | POA: Diagnosis not present

## 2020-10-09 DIAGNOSIS — L723 Sebaceous cyst: Secondary | ICD-10-CM | POA: Diagnosis not present

## 2020-10-09 DIAGNOSIS — Z4789 Encounter for other orthopedic aftercare: Secondary | ICD-10-CM | POA: Diagnosis not present

## 2020-10-09 DIAGNOSIS — M25511 Pain in right shoulder: Secondary | ICD-10-CM | POA: Diagnosis not present

## 2020-10-09 DIAGNOSIS — D485 Neoplasm of uncertain behavior of skin: Secondary | ICD-10-CM | POA: Diagnosis not present

## 2020-10-14 DIAGNOSIS — M25511 Pain in right shoulder: Secondary | ICD-10-CM | POA: Diagnosis not present

## 2020-10-14 DIAGNOSIS — Z4789 Encounter for other orthopedic aftercare: Secondary | ICD-10-CM | POA: Diagnosis not present

## 2020-10-16 DIAGNOSIS — M19011 Primary osteoarthritis, right shoulder: Secondary | ICD-10-CM | POA: Diagnosis not present

## 2020-10-18 DIAGNOSIS — M25511 Pain in right shoulder: Secondary | ICD-10-CM | POA: Diagnosis not present

## 2020-10-18 DIAGNOSIS — C61 Malignant neoplasm of prostate: Secondary | ICD-10-CM | POA: Diagnosis not present

## 2020-10-18 DIAGNOSIS — Z4789 Encounter for other orthopedic aftercare: Secondary | ICD-10-CM | POA: Diagnosis not present

## 2020-10-21 DIAGNOSIS — M25511 Pain in right shoulder: Secondary | ICD-10-CM | POA: Diagnosis not present

## 2020-10-21 DIAGNOSIS — Z4789 Encounter for other orthopedic aftercare: Secondary | ICD-10-CM | POA: Diagnosis not present

## 2020-10-24 DIAGNOSIS — Z4789 Encounter for other orthopedic aftercare: Secondary | ICD-10-CM | POA: Diagnosis not present

## 2020-10-24 DIAGNOSIS — M25511 Pain in right shoulder: Secondary | ICD-10-CM | POA: Diagnosis not present

## 2020-10-25 DIAGNOSIS — R351 Nocturia: Secondary | ICD-10-CM | POA: Diagnosis not present

## 2020-10-25 DIAGNOSIS — C61 Malignant neoplasm of prostate: Secondary | ICD-10-CM | POA: Diagnosis not present

## 2020-10-25 DIAGNOSIS — N401 Enlarged prostate with lower urinary tract symptoms: Secondary | ICD-10-CM | POA: Diagnosis not present

## 2020-10-29 DIAGNOSIS — Z4789 Encounter for other orthopedic aftercare: Secondary | ICD-10-CM | POA: Diagnosis not present

## 2020-10-29 DIAGNOSIS — M25511 Pain in right shoulder: Secondary | ICD-10-CM | POA: Diagnosis not present

## 2020-10-30 DIAGNOSIS — Z23 Encounter for immunization: Secondary | ICD-10-CM | POA: Diagnosis not present

## 2020-11-01 DIAGNOSIS — R079 Chest pain, unspecified: Secondary | ICD-10-CM | POA: Diagnosis not present

## 2020-11-01 DIAGNOSIS — N1831 Chronic kidney disease, stage 3a: Secondary | ICD-10-CM | POA: Diagnosis not present

## 2020-11-01 DIAGNOSIS — E7800 Pure hypercholesterolemia, unspecified: Secondary | ICD-10-CM | POA: Diagnosis not present

## 2020-11-01 DIAGNOSIS — G8929 Other chronic pain: Secondary | ICD-10-CM | POA: Diagnosis not present

## 2020-11-01 DIAGNOSIS — R053 Chronic cough: Secondary | ICD-10-CM | POA: Diagnosis not present

## 2020-11-01 DIAGNOSIS — I129 Hypertensive chronic kidney disease with stage 1 through stage 4 chronic kidney disease, or unspecified chronic kidney disease: Secondary | ICD-10-CM | POA: Diagnosis not present

## 2020-11-01 DIAGNOSIS — M545 Low back pain, unspecified: Secondary | ICD-10-CM | POA: Diagnosis not present

## 2020-11-05 DIAGNOSIS — Z4789 Encounter for other orthopedic aftercare: Secondary | ICD-10-CM | POA: Diagnosis not present

## 2020-11-05 DIAGNOSIS — M25511 Pain in right shoulder: Secondary | ICD-10-CM | POA: Diagnosis not present

## 2020-11-06 DIAGNOSIS — M545 Low back pain, unspecified: Secondary | ICD-10-CM | POA: Diagnosis not present

## 2020-11-06 DIAGNOSIS — M19011 Primary osteoarthritis, right shoulder: Secondary | ICD-10-CM | POA: Diagnosis not present

## 2020-11-07 DIAGNOSIS — M25511 Pain in right shoulder: Secondary | ICD-10-CM | POA: Diagnosis not present

## 2020-11-07 DIAGNOSIS — Z4789 Encounter for other orthopedic aftercare: Secondary | ICD-10-CM | POA: Diagnosis not present

## 2020-11-12 DIAGNOSIS — M25511 Pain in right shoulder: Secondary | ICD-10-CM | POA: Diagnosis not present

## 2020-11-12 DIAGNOSIS — Z4789 Encounter for other orthopedic aftercare: Secondary | ICD-10-CM | POA: Diagnosis not present

## 2020-11-15 DIAGNOSIS — M25511 Pain in right shoulder: Secondary | ICD-10-CM | POA: Diagnosis not present

## 2020-11-15 DIAGNOSIS — Z4789 Encounter for other orthopedic aftercare: Secondary | ICD-10-CM | POA: Diagnosis not present

## 2020-11-19 DIAGNOSIS — Z4789 Encounter for other orthopedic aftercare: Secondary | ICD-10-CM | POA: Diagnosis not present

## 2020-11-19 DIAGNOSIS — M25511 Pain in right shoulder: Secondary | ICD-10-CM | POA: Diagnosis not present

## 2020-11-21 DIAGNOSIS — M25511 Pain in right shoulder: Secondary | ICD-10-CM | POA: Diagnosis not present

## 2020-11-21 DIAGNOSIS — Z4789 Encounter for other orthopedic aftercare: Secondary | ICD-10-CM | POA: Diagnosis not present

## 2020-11-22 DIAGNOSIS — M25511 Pain in right shoulder: Secondary | ICD-10-CM | POA: Diagnosis not present

## 2020-11-22 DIAGNOSIS — Z4789 Encounter for other orthopedic aftercare: Secondary | ICD-10-CM | POA: Diagnosis not present

## 2020-11-26 DIAGNOSIS — M25511 Pain in right shoulder: Secondary | ICD-10-CM | POA: Diagnosis not present

## 2020-11-26 DIAGNOSIS — Z4789 Encounter for other orthopedic aftercare: Secondary | ICD-10-CM | POA: Diagnosis not present

## 2020-11-28 DIAGNOSIS — Z4789 Encounter for other orthopedic aftercare: Secondary | ICD-10-CM | POA: Diagnosis not present

## 2020-11-28 DIAGNOSIS — M25511 Pain in right shoulder: Secondary | ICD-10-CM | POA: Diagnosis not present

## 2020-11-29 DIAGNOSIS — M25511 Pain in right shoulder: Secondary | ICD-10-CM | POA: Diagnosis not present

## 2020-11-29 DIAGNOSIS — Z4789 Encounter for other orthopedic aftercare: Secondary | ICD-10-CM | POA: Diagnosis not present

## 2020-12-03 DIAGNOSIS — M25511 Pain in right shoulder: Secondary | ICD-10-CM | POA: Diagnosis not present

## 2020-12-03 DIAGNOSIS — Z4789 Encounter for other orthopedic aftercare: Secondary | ICD-10-CM | POA: Diagnosis not present

## 2020-12-05 DIAGNOSIS — M25511 Pain in right shoulder: Secondary | ICD-10-CM | POA: Diagnosis not present

## 2020-12-05 DIAGNOSIS — Z4789 Encounter for other orthopedic aftercare: Secondary | ICD-10-CM | POA: Diagnosis not present

## 2020-12-08 ENCOUNTER — Other Ambulatory Visit: Payer: Self-pay | Admitting: Cardiology

## 2020-12-10 DIAGNOSIS — Z4789 Encounter for other orthopedic aftercare: Secondary | ICD-10-CM | POA: Diagnosis not present

## 2020-12-10 DIAGNOSIS — M25511 Pain in right shoulder: Secondary | ICD-10-CM | POA: Diagnosis not present

## 2020-12-12 DIAGNOSIS — M25511 Pain in right shoulder: Secondary | ICD-10-CM | POA: Diagnosis not present

## 2020-12-12 DIAGNOSIS — Z4789 Encounter for other orthopedic aftercare: Secondary | ICD-10-CM | POA: Diagnosis not present

## 2020-12-17 ENCOUNTER — Telehealth: Payer: Self-pay | Admitting: Cardiology

## 2020-12-17 DIAGNOSIS — Z4789 Encounter for other orthopedic aftercare: Secondary | ICD-10-CM | POA: Diagnosis not present

## 2020-12-17 DIAGNOSIS — M25511 Pain in right shoulder: Secondary | ICD-10-CM | POA: Diagnosis not present

## 2020-12-17 MED ORDER — METOPROLOL SUCCINATE ER 50 MG PO TB24: 50.0000 mg | ORAL_TABLET | Freq: Two times a day (BID) | ORAL | 3 refills | Status: DC

## 2020-12-17 NOTE — Telephone Encounter (Signed)
Pt c/o medication issue:  1. Name of Medication:  metoprolol succinate (TOPROL-XL) 50 MG 24 hr tablet  2. How are you currently taking this medication (dosage and times per day)?   3. Are you having a reaction (difficulty breathing--STAT)?   4. What is your medication issue?   [facility] received 2 sets of instructions for this medication. Please clarify how patient needs to take.  Phone#: [phone] (ref#: [id number]) Option 2

## 2020-12-17 NOTE — Telephone Encounter (Signed)
Call placed to the patient to verify that he was taking the Succinate 50 mg twice daily.  A refill had been sent in for Succinate 50 mg once daily instead of twice daily. [facility] and [facility] has been corrected.

## 2020-12-19 DIAGNOSIS — M25511 Pain in right shoulder: Secondary | ICD-10-CM | POA: Diagnosis not present

## 2020-12-19 DIAGNOSIS — Z4789 Encounter for other orthopedic aftercare: Secondary | ICD-10-CM | POA: Diagnosis not present

## 2020-12-24 DIAGNOSIS — Z4789 Encounter for other orthopedic aftercare: Secondary | ICD-10-CM | POA: Diagnosis not present

## 2020-12-24 DIAGNOSIS — M25511 Pain in right shoulder: Secondary | ICD-10-CM | POA: Diagnosis not present

## 2020-12-26 DIAGNOSIS — Z4789 Encounter for other orthopedic aftercare: Secondary | ICD-10-CM | POA: Diagnosis not present

## 2020-12-26 DIAGNOSIS — M25511 Pain in right shoulder: Secondary | ICD-10-CM | POA: Diagnosis not present

## 2020-12-27 DIAGNOSIS — C44219 Basal cell carcinoma of skin of left ear and external auricular canal: Secondary | ICD-10-CM | POA: Diagnosis not present

## 2021-01-06 DIAGNOSIS — J449 Chronic obstructive pulmonary disease, unspecified: Secondary | ICD-10-CM | POA: Diagnosis not present

## 2021-01-06 DIAGNOSIS — N1831 Chronic kidney disease, stage 3a: Secondary | ICD-10-CM | POA: Diagnosis not present

## 2021-01-06 DIAGNOSIS — I129 Hypertensive chronic kidney disease with stage 1 through stage 4 chronic kidney disease, or unspecified chronic kidney disease: Secondary | ICD-10-CM | POA: Diagnosis not present

## 2021-01-13 DIAGNOSIS — M79675 Pain in left toe(s): Secondary | ICD-10-CM | POA: Diagnosis not present

## 2021-01-13 DIAGNOSIS — B351 Tinea unguium: Secondary | ICD-10-CM | POA: Diagnosis not present

## 2021-01-13 DIAGNOSIS — M79674 Pain in right toe(s): Secondary | ICD-10-CM | POA: Diagnosis not present

## 2021-01-23 DIAGNOSIS — Z20822 Contact with and (suspected) exposure to covid-19: Secondary | ICD-10-CM | POA: Diagnosis not present

## 2021-01-28 DIAGNOSIS — C61 Malignant neoplasm of prostate: Secondary | ICD-10-CM | POA: Diagnosis not present

## 2021-01-31 DIAGNOSIS — L578 Other skin changes due to chronic exposure to nonionizing radiation: Secondary | ICD-10-CM | POA: Diagnosis not present

## 2021-01-31 DIAGNOSIS — C44729 Squamous cell carcinoma of skin of left lower limb, including hip: Secondary | ICD-10-CM | POA: Diagnosis not present

## 2021-01-31 DIAGNOSIS — D225 Melanocytic nevi of trunk: Secondary | ICD-10-CM | POA: Diagnosis not present

## 2021-01-31 DIAGNOSIS — L98499 Non-pressure chronic ulcer of skin of other sites with unspecified severity: Secondary | ICD-10-CM | POA: Diagnosis not present

## 2021-01-31 DIAGNOSIS — D485 Neoplasm of uncertain behavior of skin: Secondary | ICD-10-CM | POA: Diagnosis not present

## 2021-01-31 DIAGNOSIS — L821 Other seborrheic keratosis: Secondary | ICD-10-CM | POA: Diagnosis not present

## 2021-01-31 DIAGNOSIS — Z23 Encounter for immunization: Secondary | ICD-10-CM | POA: Diagnosis not present

## 2021-01-31 DIAGNOSIS — Z86018 Personal history of other benign neoplasm: Secondary | ICD-10-CM | POA: Diagnosis not present

## 2021-01-31 DIAGNOSIS — Z85828 Personal history of other malignant neoplasm of skin: Secondary | ICD-10-CM | POA: Diagnosis not present

## 2021-01-31 DIAGNOSIS — L570 Actinic keratosis: Secondary | ICD-10-CM | POA: Diagnosis not present

## 2021-02-19 ENCOUNTER — Ambulatory Visit: Payer: Medicare Other | Admitting: Orthopaedic Surgery

## 2021-03-11 ENCOUNTER — Ambulatory Visit: Payer: Medicare Other | Admitting: Orthopaedic Surgery

## 2021-03-11 DIAGNOSIS — L570 Actinic keratosis: Secondary | ICD-10-CM | POA: Diagnosis not present

## 2021-03-11 DIAGNOSIS — Z5189 Encounter for other specified aftercare: Secondary | ICD-10-CM | POA: Diagnosis not present

## 2021-03-12 DIAGNOSIS — L923 Foreign body granuloma of the skin and subcutaneous tissue: Secondary | ICD-10-CM | POA: Diagnosis not present

## 2021-03-12 DIAGNOSIS — C44729 Squamous cell carcinoma of skin of left lower limb, including hip: Secondary | ICD-10-CM | POA: Diagnosis not present

## 2021-03-12 DIAGNOSIS — I872 Venous insufficiency (chronic) (peripheral): Secondary | ICD-10-CM | POA: Diagnosis not present

## 2021-03-13 ENCOUNTER — Ambulatory Visit: Payer: Medicare Other | Admitting: Orthopaedic Surgery

## 2021-03-24 DIAGNOSIS — M79675 Pain in left toe(s): Secondary | ICD-10-CM | POA: Diagnosis not present

## 2021-03-24 DIAGNOSIS — B351 Tinea unguium: Secondary | ICD-10-CM | POA: Diagnosis not present

## 2021-03-24 DIAGNOSIS — M79674 Pain in right toe(s): Secondary | ICD-10-CM | POA: Diagnosis not present

## 2021-04-09 DIAGNOSIS — Z961 Presence of intraocular lens: Secondary | ICD-10-CM | POA: Diagnosis not present

## 2021-04-09 DIAGNOSIS — H35372 Puckering of macula, left eye: Secondary | ICD-10-CM | POA: Diagnosis not present

## 2021-04-14 DIAGNOSIS — Z20822 Contact with and (suspected) exposure to covid-19: Secondary | ICD-10-CM | POA: Diagnosis not present

## 2021-04-18 DIAGNOSIS — C61 Malignant neoplasm of prostate: Secondary | ICD-10-CM | POA: Diagnosis not present

## 2021-04-23 DIAGNOSIS — Z20828 Contact with and (suspected) exposure to other viral communicable diseases: Secondary | ICD-10-CM | POA: Diagnosis not present

## 2021-04-25 DIAGNOSIS — N401 Enlarged prostate with lower urinary tract symptoms: Secondary | ICD-10-CM | POA: Diagnosis not present

## 2021-04-25 DIAGNOSIS — Z8546 Personal history of malignant neoplasm of prostate: Secondary | ICD-10-CM | POA: Diagnosis not present

## 2021-04-25 DIAGNOSIS — R351 Nocturia: Secondary | ICD-10-CM | POA: Diagnosis not present

## 2021-04-29 DIAGNOSIS — Z4801 Encounter for change or removal of surgical wound dressing: Secondary | ICD-10-CM | POA: Diagnosis not present

## 2021-04-29 DIAGNOSIS — Z48817 Encounter for surgical aftercare following surgery on the skin and subcutaneous tissue: Secondary | ICD-10-CM | POA: Diagnosis not present

## 2021-05-22 DIAGNOSIS — Z20822 Contact with and (suspected) exposure to covid-19: Secondary | ICD-10-CM | POA: Diagnosis not present

## 2021-05-26 DIAGNOSIS — B351 Tinea unguium: Secondary | ICD-10-CM | POA: Diagnosis not present

## 2021-05-26 DIAGNOSIS — M79675 Pain in left toe(s): Secondary | ICD-10-CM | POA: Diagnosis not present

## 2021-05-26 DIAGNOSIS — M79674 Pain in right toe(s): Secondary | ICD-10-CM | POA: Diagnosis not present

## 2021-05-27 ENCOUNTER — Ambulatory Visit (INDEPENDENT_AMBULATORY_CARE_PROVIDER_SITE_OTHER): Payer: Medicare Other

## 2021-05-27 ENCOUNTER — Ambulatory Visit (INDEPENDENT_AMBULATORY_CARE_PROVIDER_SITE_OTHER): Payer: Medicare Other | Admitting: Orthopaedic Surgery

## 2021-05-27 ENCOUNTER — Encounter: Payer: Self-pay | Admitting: Orthopaedic Surgery

## 2021-05-27 VITALS — Ht 72.0 in | Wt 210.0 lb

## 2021-05-27 DIAGNOSIS — M545 Low back pain, unspecified: Secondary | ICD-10-CM | POA: Insufficient documentation

## 2021-05-27 DIAGNOSIS — M1612 Unilateral primary osteoarthritis, left hip: Secondary | ICD-10-CM | POA: Diagnosis not present

## 2021-05-27 DIAGNOSIS — G8929 Other chronic pain: Secondary | ICD-10-CM | POA: Diagnosis not present

## 2021-05-27 DIAGNOSIS — M25552 Pain in left hip: Secondary | ICD-10-CM | POA: Diagnosis not present

## 2021-05-27 NOTE — Progress Notes (Signed)
? ?Office Visit Note ?  ?Patient: Marc Gilbert           ?Date of Birth: October 31, 1930           ?MRN: [id number] ?Visit Date: 05/27/2021 ?             ?Requested by: Lajean Manes, MD ?[address] ?[address] ?[place],  [place] [address] ?PCP: Lajean Manes, MD ? ? ?Assessment & Plan: ?Visit Diagnoses:  ?1. Pain in left hip   ?2. Chronic right-sided low back pain, unspecified whether sciatica present   ?3. Unilateral primary osteoarthritis, left hip   ?4. Chronic bilateral low back pain without sciatica   ? ? ?Plan: Marc Gilbert is accompanied by his daughter, Marc Gilbert, and is seen for evaluation for chronic low back and left hip pain.  Low back pain appears to be localized to the back and worse when he first gets up in the morning and seems to be little bit worse when he is up and ambulating.  He has not experienced any lower extremity radicular pain, with more feeling of heaviness.  He has had a right total hip replacement years ago and is doing very well but is is having some trouble with his left hip.  X-rays demonstrate advanced arthritis of the left hip and of the lumbar spine as well.  He had an MRI scan of his lumbar spine in 2007 demonstrating a right posterolateral disc herniation at L5-S1 that responded to time and injections.  Long discussion regarding both the left hip and his back.  He is definitely having more trouble with his low back pain and I think it is worth repeating the MRI scan.  He might be a candidate for epidural steroid injection.  He might be a good candidate for intra-articular cortisone injection in his left hip at some point in the future.  Surgery is not an option at this point. ? ?Follow-Up Instructions: Return After MRI scan lumbar spine.  ? ?Orders:  ?Orders Placed This Encounter  ?Procedures  ? XR Lumbar Spine 2-3 Views  ? XR HIP UNILAT W OR W/O PELVIS 2-3 VIEWS LEFT  ? MR Lumbar Spine w/o contrast  ? ?No orders of the defined types were placed in this encounter. ? ? ? ?  Procedures: ?No procedures performed ? ? ?Clinical Data: ?No additional findings. ? ? ?Subjective: ?Chief Complaint  ?Patient presents with  ? Lower Back - Pain  ? Left Hip - Pain  ?Patient presents today for his right lower back pain and left hip pain. He said that his lower back has been an issue for years, but worsening with time. He has no pain or numbness down either leg. He has more pain with walking. He states that his pain can worse on the right side. He states that he received injections abouit 15years ago and they helped. His left hip hurts in the groin and lateral hip He finds it difficult to abduct his leg to the side. He said that it feels like "it wants to jump out of place". He has a history of right total hip arthroplasty. He takes Tylenol for pain as needed. ? ?HPI ? ?Review of Systems ? ? ?Objective: ?Vital Signs: Ht 6' (1.829 m)   Wt 210 lb (95.3 kg)   BMI 28.48 kg/m?  ? ?Physical Exam ?Constitutional:   ?   Appearance: He is well-developed.  ?Pulmonary:  ?   Effort: Pulmonary effort is normal.  ?Skin: ?  General: Skin is warm and dry.  ?Neurological:  ?   Mental Status: He is alert and oriented to person, place, and time.  ?Psychiatric:     ?   Behavior: Behavior normal.  ? ? ?Ortho Exam awake alert and oriented x3.  Comfortable sitting.  Does use a cane to help with his ambulation.  Has some difficulty fully standing erect related to his low back pain and there is some mild percussible tenderness lumbar spine but negative straight leg raise.  Painless range of motion of his right hip but there is decreased internal/external rotation of his left hip with pain on extreme consistent with the arthritic changes identified by film.  He has had Moh's surgery the left shin which is still healing.  Motor exam appears to be intact. ? ?Specialty Comments:  ?No specialty comments available. ? ?Imaging: ?XR HIP UNILAT W OR W/O PELVIS 2-3 VIEWS LEFT ? ?Result Date: 05/27/2021 ?AP pelvis and lateral left hip  demonstrate degenerative changes of the left hip.  There is a right total hip replacement in excellent position without obvious complications.  There is subchondral sclerosis on both sides of the joint in the left hip with significant narrowing of the joint space.  There is small subchondral cysts in the femoral head.  Films are consistent with advanced osteoarthritis ? ?XR Lumbar Spine 2-3 Views ? ?Result Date: 05/27/2021 ?Films lumbar spine obtained in 2 projections.  There is significant degenerative change throughout the lumbar spine there is slight anterior listhesis of L5 on S1 and a degenerative scoliosis involving the lower 3 or 4 lumbar vertebrae to the right.  Diffuse facet sclerosis and narrowing of the disc spaces consistent with advanced osteoarthritis.  I do not see an obvious compression fracture.  Diffuse calcification abdominal aorta without obvious aneurysmal dilatation  ? ? ?PMFS History: ?Patient Active Problem List  ? Diagnosis Date Noted  ? Unilateral primary osteoarthritis, left hip 05/27/2021  ? Low back pain 05/27/2021  ? DVT (deep venous thrombosis) ([place]) 07/02/2020  ? S/P reverse total shoulder arthroplasty, right 06/18/2020  ? Osteoarthritis of right shoulder 05/16/2020  ? Malignant neoplasm of prostate ([place]) 04/25/2019  ? Dyslipidemia 01/03/2019  ? Pain in right shoulder 09/20/2018  ? Unilateral primary osteoarthritis, right knee 09/20/2018  ? Bilateral hearing loss 12/07/2016  ? Bilateral impacted cerumen 12/07/2016  ? Presence of aortocoronary bypass graft 09/17/2016  ? Coronary artery disease of native heart with stable angina pectoris ([place]) 09/17/2016  ? Unstable angina ([place]) 09/17/2016  ? Hx of CABG 08/14/2016  ? Chest pain 08/10/2016  ? CKD (chronic kidney disease) stage 3, GFR 30-59 ml/min (HCC) 08/30/2015  ? Hyperlipidemia 08/30/2015  ? Osteoarthritis of hip 05/01/2011  ? Hypertension 05/01/2011  ? COPD (chronic obstructive pulmonary disease) ([place]) 05/01/2011  ? Cancer of skin,  face 05/01/2011  ? ?Past Medical History:  ?Diagnosis Date  ? Arthritis   ? "right hand; back" (08/30/2015)  ? Cancer [facility])   ? skin  squamous and basal  ? Chronic lower back pain   ? CKD (chronic kidney disease), stage III ([place])   ? Stage III  ? COPD (chronic obstructive pulmonary disease) ([place])   ? no home O2  ? Coronary artery disease   ? Dysplastic colon polyp age 37  ? carcinoma in situ  ? GERD (gastroesophageal reflux disease)   ? occ  ? H/O hiatal hernia   ? Hyperlipidemia   ? Hypertension   ? Prostate cancer ([place]) 07/2014  ?  active surveillance, Glisson 6  ?  ?Family History  ?Problem Relation Age of Onset  ? Breast cancer Mother 39  ?     mastectomy  ? Diabetes Father   ? Breast cancer Daughter   ?     2005. Lumpectomy with chemo and radiation. Under the care of Dr. Jana Hakim    ? Pancreatic cancer Neg Hx   ? Colon cancer Neg Hx   ? Prostate cancer Neg Hx   ?  ?Past Surgical History:  ?Procedure Laterality Date  ? CATARACT EXTRACTION W/ INTRAOCULAR LENS IMPLANT Left 08/27/15  ? CORONARY ARTERY BYPASS GRAFT N/A 08/14/2016  ? Procedure: CORONARY ARTERY BYPASS GRAFTING (CABG)x2, using left internal mammary artery and right greater saphenous vein harvested endoscopically;  Surgeon: Gaye Pollack, MD;  Location: [facility];  Service: Open Heart Surgery;  Laterality: N/A;  ? JOINT REPLACEMENT    ? LEFT HEART CATH AND CORONARY ANGIOGRAPHY N/A 08/11/2016  ? Procedure: Left Heart Cath and Coronary Angiography;  Surgeon: Belva Crome, MD;  Location: [facility];  Service: Cardiovascular;  Laterality: N/A;  ? MOHS SURGERY    ? multiple SCC  ? PROSTATE BIOPSY  07/2014  ? TEE WITHOUT CARDIOVERSION N/A 08/14/2016  ? Procedure: TRANSESOPHAGEAL ECHOCARDIOGRAM (TEE);  Surgeon: Gaye Pollack, MD;  Location: [facility];  Service: Open Heart Surgery;  Laterality: N/A;  ? TONSILLECTOMY    ? TOTAL HIP ARTHROPLASTY  05/26/2011  ? Procedure: TOTAL HIP ARTHROPLASTY;  Surgeon: Garald Balding, MD;  Location: [facility];  Service: Orthopedics;   Laterality: Right;  ? TOTAL SHOULDER ARTHROPLASTY Right 06/18/2020  ? Procedure: TOTAL SHOULDER ARTHROPLASTY;  Surgeon: Marchia Bond, MD;  [facility];  Service: Orthopedics;  Laterality: Right;  ? ULTRASOUND

## 2021-05-31 IMAGING — CT CT SHOULDER*R* W/O CM
2 of 3 series · 12 of 20 positions shown, 15 images · non-contrast
Comparison: Right shoulder x-rays dated September 20, 2018

CLINICAL DATA: Chronic right shoulder pain.

EXAM:
CT OF THE UPPER RIGHT EXTREMITY WITHOUT CONTRAST
TECHNIQUE: Multidetector CT imaging of the right shoulder was performed
according to the standard protocol.

[Series 6: shoulder 2.00 br40 s3 cor soft · coronal · 0.40mm/px · 3 of 112 slices shown]
[im 23/112  bone]
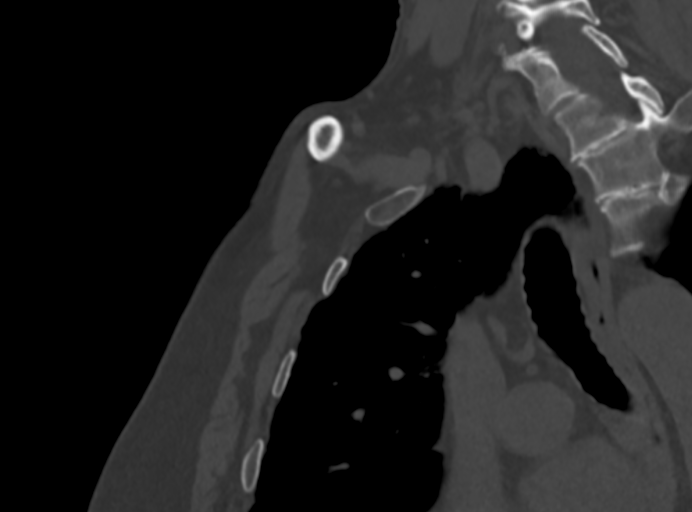
[im 45/112  bone]
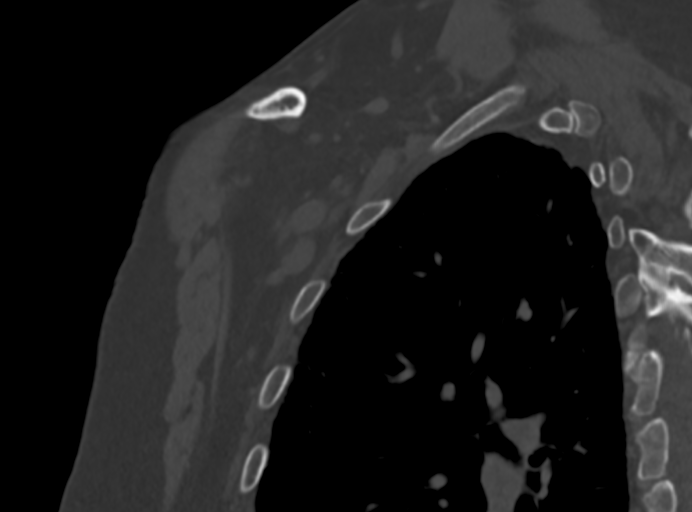
[im 67/112  bone]
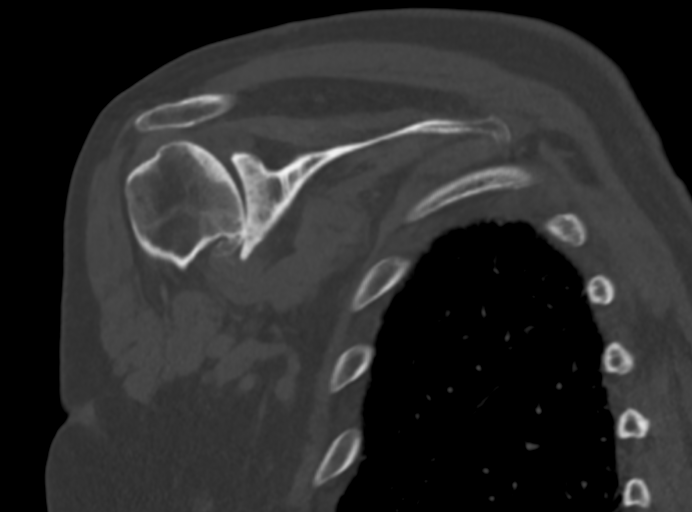

[Series 15: shoulder 0.60 br60 s3 thins bone · axial · 0.55mm/px · z∈[-995,-833]mm · 9 of 339 slices shown, 12 images]
[im 34/339  soft-tissue]
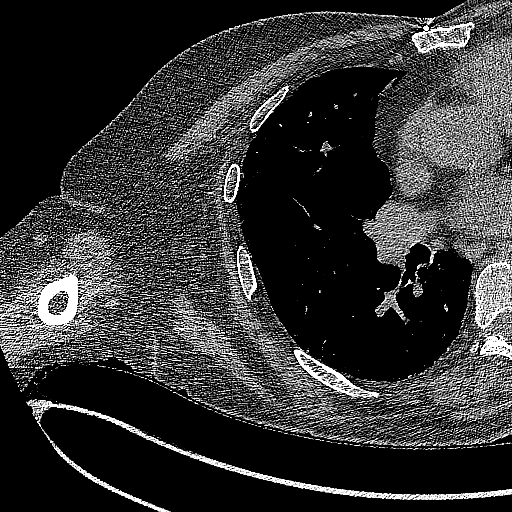
[im 34/339  bone]
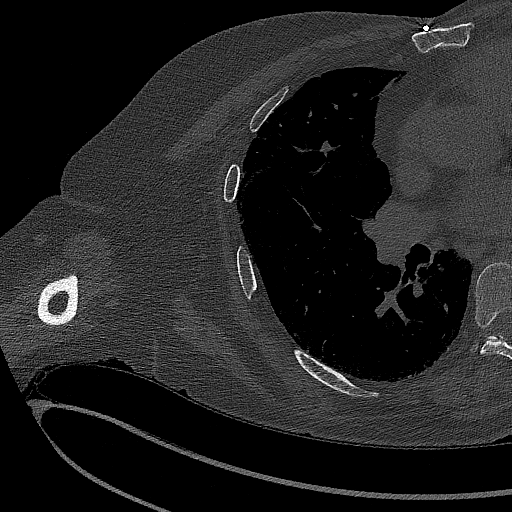
[im 68/339  bone]
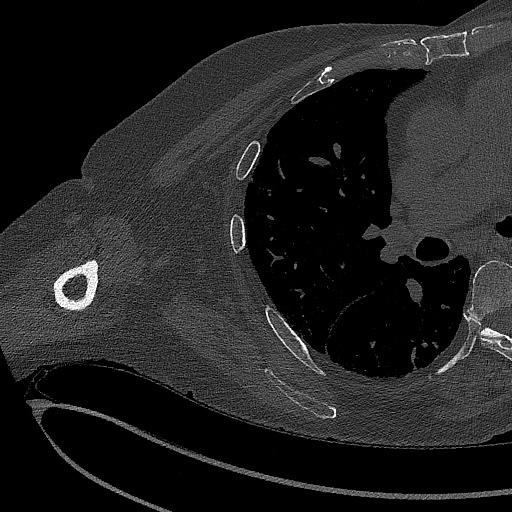
[im 102/339  bone]
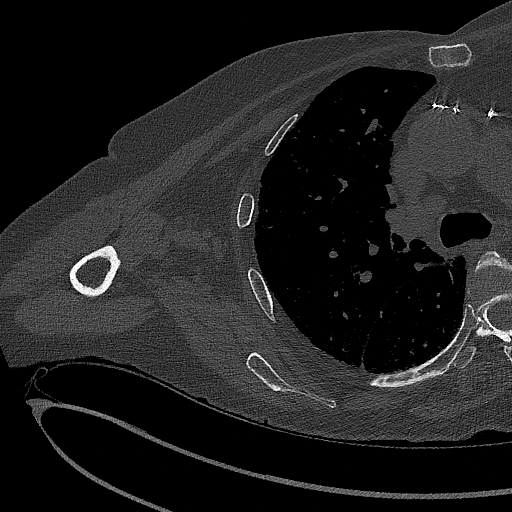
[im 136/339  bone]
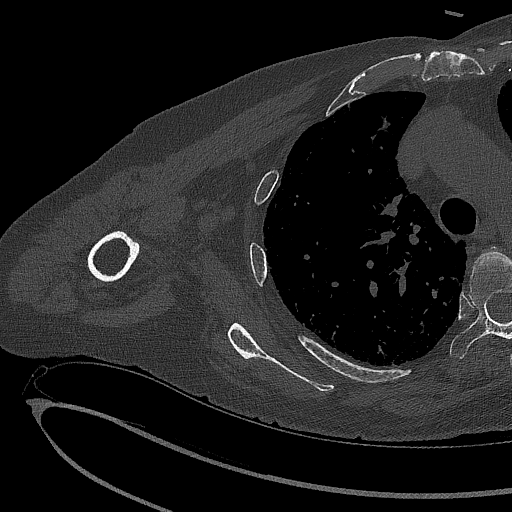
[im 170/339  soft-tissue]
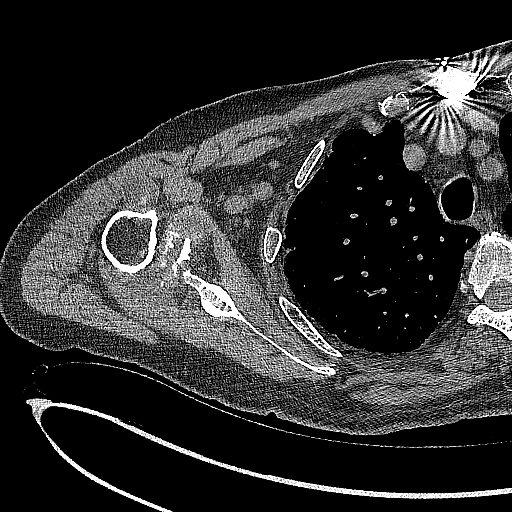
[im 170/339  bone]
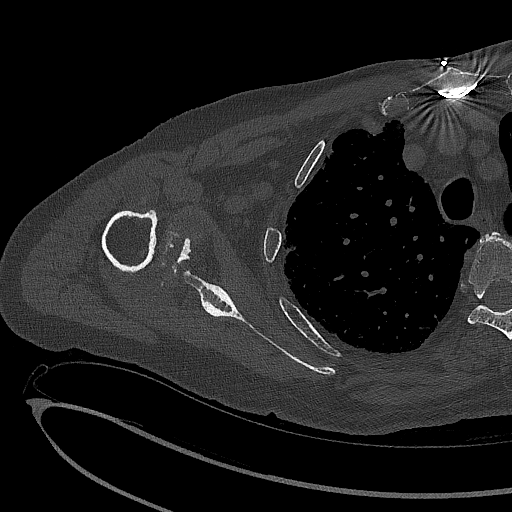
[im 203/339  bone]
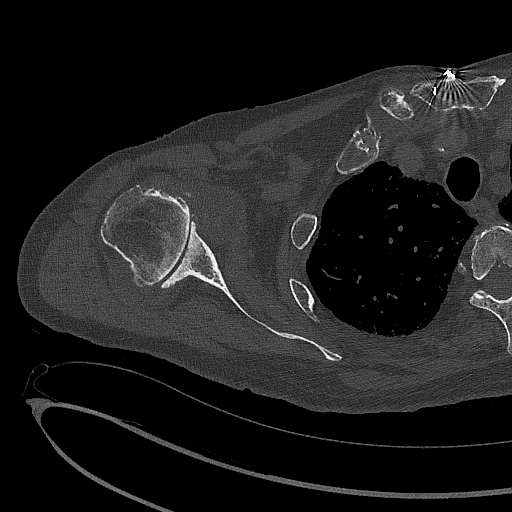
[im 237/339  bone]
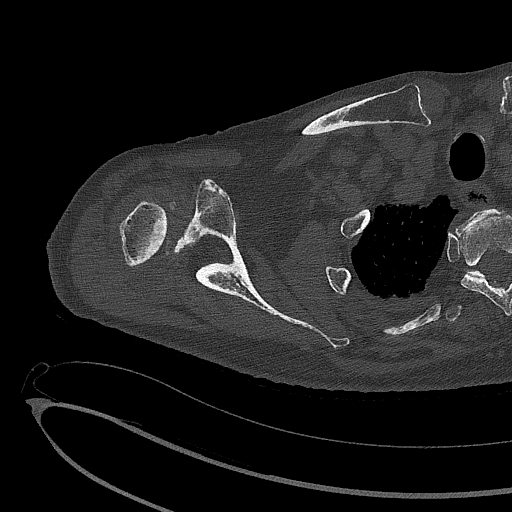
[im 271/339  bone]
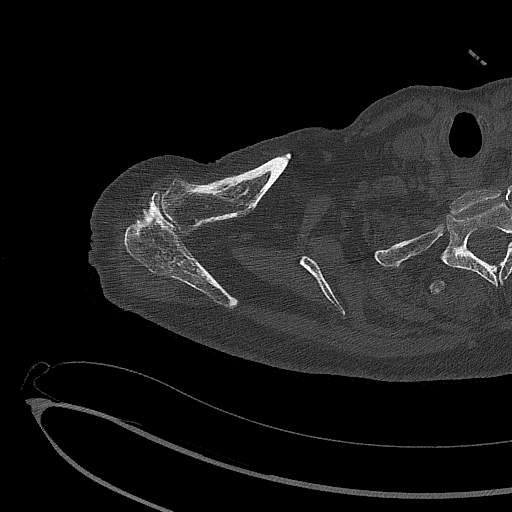
[im 305/339  soft-tissue]
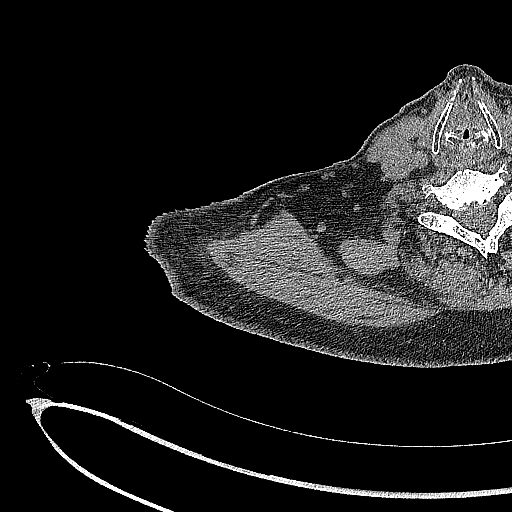
[im 305/339  bone]
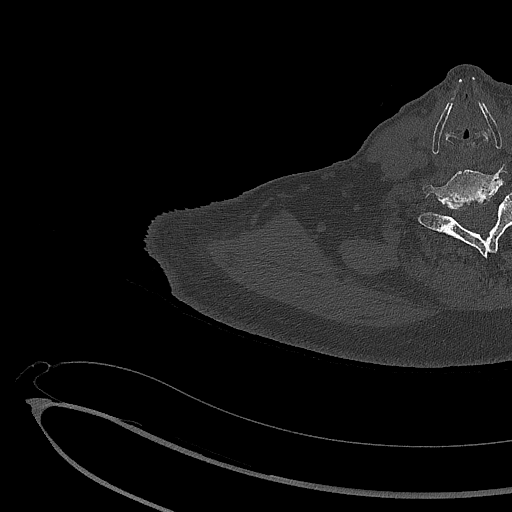

[12 of 20 positions shown; findings below may reference images not displayed]

FINDINGS: Bones/Joint/Cartilage

No fracture or dislocation. Large glenohumeral joint effusion with
multiple tiny intra-articular bodies

Severe osteoarthritis of the glenohumeral joint with severe joint
space narrowing, bone-on-bone appearance, subchondral sclerosis,
subchondral cystic changes and marginal osteophytosis.

Mild arthropathy of the acromioclavicular joint.  Type I acromion.

Ligaments

Ligaments are suboptimally evaluated by CT.

Muscles and Tendons
Grossly intact.  No muscle atrophy.

Soft tissue
No fluid collection or hematoma. No soft tissue mass. Prior CABG.
Mild increased subpleural reticulation in the right lung.
IMPRESSION: 1. Severe glenohumeral osteoarthritis with large joint effusion and
multiple tiny intra-articular bodies.

## 2021-06-02 DIAGNOSIS — Z20822 Contact with and (suspected) exposure to covid-19: Secondary | ICD-10-CM | POA: Diagnosis not present

## 2021-06-02 DIAGNOSIS — S81802A Unspecified open wound, left lower leg, initial encounter: Secondary | ICD-10-CM | POA: Diagnosis not present

## 2021-06-16 ENCOUNTER — Ambulatory Visit
Admission: RE | Admit: 2021-06-16 | Discharge: 2021-06-16 | Disposition: A | Discharge status: Home / Self Care | Payer: Medicare Other | Source: Ambulatory Visit | Attending: Orthopaedic Surgery | Admitting: Orthopaedic Surgery

## 2021-06-16 DIAGNOSIS — M545 Low back pain, unspecified: Secondary | ICD-10-CM

## 2021-06-17 ENCOUNTER — Encounter: Payer: Self-pay | Admitting: Orthopaedic Surgery

## 2021-06-17 NOTE — Telephone Encounter (Signed)
Called and scheduled patient

## 2021-06-21 DIAGNOSIS — Z20822 Contact with and (suspected) exposure to covid-19: Secondary | ICD-10-CM | POA: Diagnosis not present

## 2021-06-23 DIAGNOSIS — Z20822 Contact with and (suspected) exposure to covid-19: Secondary | ICD-10-CM | POA: Diagnosis not present

## 2021-06-24 ENCOUNTER — Encounter: Payer: Self-pay | Admitting: Orthopaedic Surgery

## 2021-06-24 ENCOUNTER — Ambulatory Visit (INDEPENDENT_AMBULATORY_CARE_PROVIDER_SITE_OTHER): Payer: Medicare Other | Admitting: Orthopaedic Surgery

## 2021-06-24 DIAGNOSIS — M545 Low back pain, unspecified: Secondary | ICD-10-CM

## 2021-06-24 DIAGNOSIS — G8929 Other chronic pain: Secondary | ICD-10-CM

## 2021-06-24 DIAGNOSIS — M1612 Unilateral primary osteoarthritis, left hip: Secondary | ICD-10-CM

## 2021-06-24 NOTE — Progress Notes (Signed)
? ?Office Visit Note ?  ?Patient: Marc Gilbert           ?Date of Birth: 18-Jun-1930           ?MRN: [id number] ?Visit Date: 06/24/2021 ?             ?Requested by: Lajean Manes, MD ?[address] ?[address] ?[place],  [place] [address] ?PCP: Lajean Manes, MD ? ? ?Assessment & Plan: ?Visit Diagnoses:  ?1. Chronic right-sided low back pain, unspecified whether sciatica present   ?2. Primary osteoarthritis of left hip   ? ? ?Plan: Marc Gilbert is accompanied by his daughter and wife and here in follow-up of his chronic back pain having had an MRI scan.  The scan demonstrated progression of severe L5-S1 facet arthrosis with worsened anterior listhesis and unchanged moderate bilateral foraminal stenosis.  There was progression of degenerative disc disease at L3-4 with worsening moderate left foraminal stenosis.  No leg pain.  Pain is localized to the back into his left hip which was previously diagnosed with osteoarthritis.  Long discussion regarding all of the above.  Answered questions.  I think having Dr. Ernestina Patches evaluate for possible L5-S1 facet injections is worthwhile.  At some point he might be a good candidate to inject his left hip ? ?Follow-Up Instructions: Return if symptoms worsen or fail to improve.  ? ?Orders:  ?Orders Placed This Encounter  ?Procedures  ? Ambulatory referral to Physical Medicine Rehab  ? ?No orders of the defined types were placed in this encounter. ? ? ? ? Procedures: ?No procedures performed ? ? ?Clinical Data: ?No additional findings. ? ? ?Subjective: ?Chief Complaint  ?Patient presents with  ? Lower Back - Follow-up  ?  MRI review  ?Patient presents today for follow up on his lower back. He had an MRI and is here today for those results.  No change in his symptoms.  His back pain is localized to his back without referred pain to either lower extremity.  He does have arthritis of his left hip and does have good groin pain ? ?HPI ? ?Review of Systems ? ? ?Objective: ?Vital Signs: There  were no vitals taken for this visit. ? ?Physical Exam ?Constitutional:   ?   Appearance: He is well-developed.  ?Eyes:  ?   Pupils: Pupils are equal, round, and reactive to light.  ?Pulmonary:  ?   Effort: Pulmonary effort is normal.  ?Skin: ?   General: Skin is warm and dry.  ?Neurological:  ?   Mental Status: He is alert and oriented to person, place, and time.  ?Psychiatric:     ?   Behavior: Behavior normal.  ? ? ?Ortho Exam awake alert and oriented x3.  Comfortable sitting no percussible tenderness of lumbar spine.  He does have some pain with internal/external rotation of his left hip previous diagnosis of osteoarthritis.  Seems to have good lower extremity strength.  No flank pain ? ?Specialty Comments:  ?No specialty comments available. ? ?Imaging: ?No results found. ? ? ?PMFS History: ?Patient Active Problem List  ? Diagnosis Date Noted  ? Unilateral primary osteoarthritis, left hip 05/27/2021  ? Low back pain 05/27/2021  ? DVT (deep venous thrombosis) ([facility]) 07/02/2020  ? S/P reverse total shoulder arthroplasty, right 06/18/2020  ? Osteoarthritis of right shoulder 05/16/2020  ? Malignant neoplasm of prostate ([place]) 04/25/2019  ? Dyslipidemia 01/03/2019  ? Pain in right shoulder 09/20/2018  ? Unilateral primary osteoarthritis, right knee 09/20/2018  ? Bilateral  hearing loss 12/07/2016  ? Bilateral impacted cerumen 12/07/2016  ? Presence of aortocoronary bypass graft 09/17/2016  ? Coronary artery disease of native heart with stable angina pectoris ([facility]) 09/17/2016  ? Unstable angina ([facility]) 09/17/2016  ? Hx of CABG 08/14/2016  ? Chest pain 08/10/2016  ? CKD (chronic kidney disease) stage 3, GFR 30-59 ml/min ([facility]) 08/30/2015  ? Hyperlipidemia 08/30/2015  ? Osteoarthritis of hip 05/01/2011  ? Hypertension 05/01/2011  ? COPD (chronic obstructive pulmonary disease) ([facility]) 05/01/2011  ? Cancer of skin, face 05/01/2011  ? ?Past Medical History:  ?Diagnosis Date  ? Arthritis   ? "right hand; back" (08/30/2015)  ? Cancer  [facility])   ? skin  squamous and basal  ? Chronic lower back pain   ? CKD (chronic kidney disease), stage III ([place])   ? Stage III  ? COPD (chronic obstructive pulmonary disease) ([place])   ? no home O2  ? Coronary artery disease   ? Dysplastic colon polyp age 64  ? carcinoma in situ  ? GERD (gastroesophageal reflux disease)   ? occ  ? H/O hiatal hernia   ? Hyperlipidemia   ? Hypertension   ? Prostate cancer ([place]) 07/2014  ? active surveillance, [place] [facility]  ?  ?Family History  ?Problem Relation Age of Onset  ? Breast cancer Mother 78  ?     mastectomy  ? Diabetes Father   ? Breast cancer Daughter   ?     2005. Lumpectomy with chemo and radiation. Under the care of Dr. Jana Hakim    ? Pancreatic cancer Neg Hx   ? Colon cancer Neg Hx   ? Prostate cancer Neg Hx   ?  ?Past Surgical History:  ?Procedure Laterality Date  ? CATARACT EXTRACTION W/ INTRAOCULAR LENS IMPLANT Left 08/27/15  ? CORONARY ARTERY BYPASS GRAFT N/A 08/14/2016  ? Procedure: CORONARY ARTERY BYPASS GRAFTING (CABG)x2, using left internal mammary artery and right greater saphenous vein harvested endoscopically;  Surgeon: Gaye Pollack, MD;  Location: [facility];  Service: Open Heart Surgery;  Laterality: N/A;  ? JOINT REPLACEMENT    ? LEFT HEART CATH AND CORONARY ANGIOGRAPHY N/A 08/11/2016  ? Procedure: Left Heart Cath and Coronary Angiography;  Surgeon: Belva Crome, MD;  Location: [facility];  Service: Cardiovascular;  Laterality: N/A;  ? MOHS SURGERY    ? multiple SCC  ? PROSTATE BIOPSY  07/2014  ? TEE WITHOUT CARDIOVERSION N/A 08/14/2016  ? Procedure: TRANSESOPHAGEAL ECHOCARDIOGRAM (TEE);  Surgeon: Gaye Pollack, MD;  Location: [facility];  Service: Open Heart Surgery;  Laterality: N/A;  ? TONSILLECTOMY    ? TOTAL HIP ARTHROPLASTY  05/26/2011  ? Procedure: TOTAL HIP ARTHROPLASTY;  Surgeon: Garald Balding, MD;  Location: [facility];  Service: Orthopedics;  Laterality: Right;  ? TOTAL SHOULDER ARTHROPLASTY Right 06/18/2020  ? Procedure: TOTAL SHOULDER ARTHROPLASTY;   Surgeon: Marchia Bond, MD;  Location: [facility];  Service: Orthopedics;  Laterality: Right;  ? ULTRASOUND GUIDANCE FOR VASCULAR ACCESS  08/11/2016  ? Procedure: Ultrasound Guidance For Vascular Access;  Surgeon: Belva Crome, MD;  Location: [facility];  Service: Cardiovascular;;  ? ?Social History  ? ?Occupational History  ? Occupation: retired 1996  ?Tobacco Use  ? Smoking status: Former  ?  Packs/day: 0.50  ?  Years: 25.00  ?  Pack years: 12.50  ?  Types: Cigarettes  ?  Quit date: 05/24/1988  ?  Years since quitting: 33.1  ? Smokeless tobacco: Never  ?Vaping  Use  ? Vaping Use: Never used  ?Substance and Sexual Activity  ? Alcohol use: Yes  ?  Alcohol/week: 10.0 standard drinks  ?  Types: 5 Glasses of wine, 5 Cans of beer per week  ?  Comment: daily  ? Drug use: No  ? Sexual activity: Not Currently  ? ? ? ? ? ? ?

## 2021-06-26 ENCOUNTER — Telehealth: Payer: Self-pay | Admitting: Physical Medicine and Rehabilitation

## 2021-06-26 NOTE — Telephone Encounter (Signed)
Patient returned call asked for a call back to schedule appointment.  Ph# [phone]  ?

## 2021-07-02 DIAGNOSIS — S81802A Unspecified open wound, left lower leg, initial encounter: Secondary | ICD-10-CM | POA: Diagnosis not present

## 2021-07-03 ENCOUNTER — Telehealth: Payer: Self-pay | Admitting: Physical Medicine and Rehabilitation

## 2021-07-03 NOTE — Telephone Encounter (Signed)
Patient called. He would like an earlier date for the injection. His call back number is [phone]

## 2021-07-09 DIAGNOSIS — Z23 Encounter for immunization: Secondary | ICD-10-CM | POA: Diagnosis not present

## 2021-07-16 DIAGNOSIS — L821 Other seborrheic keratosis: Secondary | ICD-10-CM | POA: Diagnosis not present

## 2021-07-16 DIAGNOSIS — L578 Other skin changes due to chronic exposure to nonionizing radiation: Secondary | ICD-10-CM | POA: Diagnosis not present

## 2021-07-16 DIAGNOSIS — L570 Actinic keratosis: Secondary | ICD-10-CM | POA: Diagnosis not present

## 2021-07-16 DIAGNOSIS — Z86018 Personal history of other benign neoplasm: Secondary | ICD-10-CM | POA: Diagnosis not present

## 2021-07-16 DIAGNOSIS — D225 Melanocytic nevi of trunk: Secondary | ICD-10-CM | POA: Diagnosis not present

## 2021-07-16 DIAGNOSIS — Z85828 Personal history of other malignant neoplasm of skin: Secondary | ICD-10-CM | POA: Diagnosis not present

## 2021-07-24 ENCOUNTER — Ambulatory Visit: Payer: Self-pay

## 2021-07-24 ENCOUNTER — Ambulatory Visit (INDEPENDENT_AMBULATORY_CARE_PROVIDER_SITE_OTHER): Payer: Medicare Other | Admitting: Physical Medicine and Rehabilitation

## 2021-07-24 ENCOUNTER — Encounter: Payer: Self-pay | Admitting: Physical Medicine and Rehabilitation

## 2021-07-24 VITALS — BP 143/73 | HR 85

## 2021-07-24 DIAGNOSIS — M25552 Pain in left hip: Secondary | ICD-10-CM

## 2021-07-24 DIAGNOSIS — M47816 Spondylosis without myelopathy or radiculopathy, lumbar region: Secondary | ICD-10-CM

## 2021-07-24 MED ORDER — METHYLPREDNISOLONE ACETATE 80 MG/ML IJ SUSP: 80.0000 mg | Freq: Once | INTRAMUSCULAR | Status: DC

## 2021-07-24 NOTE — Progress Notes (Signed)
Pt state lower back pain that travels to his left hip. Pt state walking makes the pain worse. Pt state he takes over the counter pain meds to help ease his pain.  Numeric Pain Rating Scale and Functional Assessment Average Pain 2   In the last MONTH (on 0-10 scale) has pain interfered with the following?  1. General activity like being  able to carry out your everyday physical activities such as walking, climbing stairs, carrying groceries, or moving a chair?  Rating(10)   +Driver, -BT, -Dye Allergies.

## 2021-07-24 NOTE — Progress Notes (Signed)
Marc Gilbert - 88 y.o. male MRN [id number]  Date of birth: 1930/03/05  Office Visit Note: Visit Date: 07/24/2021 PCP: Lajean Manes, MD Referred by: Lajean Manes, MD  Subjective: Chief Complaint  Patient presents with   Lower Back - Pain   Left Hip - Pain   HPI:  Marc Gilbert is a 88 y.o. male who comes in today at the request of Dr. Joni Fears for planned Left anesthetic hip arthrogram with fluoroscopic guidance.  The patient has failed conservative care including home exercise, medications, time and activity modification.  This injection will be diagnostic and hopefully therapeutic.  Please see requesting physician notes for further details and justification.   ROS Otherwise per HPI.  Assessment & Plan: Visit Diagnoses:    ICD-10-CM   1. Pain in left hip  M25.552 XR C-ARM NO REPORT    methylPREDNISolone acetate (DEPO-MEDROL) injection 80 mg    Large Joint Inj: L hip joint    2. Spondylosis without myelopathy or radiculopathy, lumbar region  M47.816 Ambulatory referral to Physical Medicine Rehab      Plan: No additional findings.   Meds & Orders:  Meds ordered this encounter  Medications   methylPREDNISolone acetate (DEPO-MEDROL) injection 80 mg    Orders Placed This Encounter  Procedures   Large Joint Inj: L hip joint   XR C-ARM NO REPORT   Ambulatory referral to Physical Medicine Rehab    Follow-up: Return if symptoms worsen or fail to improve.   Procedures: Large Joint Inj: L hip joint on 07/24/2021 10:00 AM Indications: diagnostic evaluation and pain Details: 22 G 3.5 in needle, fluoroscopy-guided anterior approach  Arthrogram: No  Medications: 4 mL bupivacaine 0.25 %; 60 mg triamcinolone acetonide 40 MG/ML Outcome: tolerated well, no immediate complications  There was excellent flow of contrast producing a partial arthrogram of the hip. The patient did have relief of symptoms during the anesthetic phase of the injection. Procedure, treatment  alternatives, risks and benefits explained, specific risks discussed. Consent was given by the patient. Immediately prior to procedure a time out was called to verify the correct patient, procedure, equipment, support staff and site/side marked as required. Patient was prepped and draped in the usual sterile fashion.          Clinical History: MRI LUMBAR SPINE WITHOUT CONTRAST   TECHNIQUE: Multiplanar, multisequence MR imaging of the lumbar spine was performed. No intravenous contrast was administered.   COMPARISON:  10/29/2005   FINDINGS: Segmentation:  Standard   Alignment: Grade 1 retrolisthesis at L2-3 and L3-4. Grade 1 anterolisthesis at L5-S1. S shaped scoliosis.   Vertebrae:  No fracture, evidence of discitis, or bone lesion.   Conus medullaris and cauda equina: Conus extends to the L1 level. Conus and cauda equina appear normal.   Paraspinal and other soft tissues: Fatty atrophy of the paraspinous muscles   Disc levels:   T12-L1: Small disc bulge without stenosis.   L1-L2: Progression of disc bulge with endplate spurring. No spinal canal stenosis. Mild left neural foraminal stenosis.   L2-L3: Small disc bulge, unchanged. No spinal canal stenosis. Mild left neural foraminal stenosis.   L3-L4: Progression of disc degeneration with small bulge and endplate spurring. Unchanged left lateral recess narrowing without central spinal canal stenosis. Progression of moderate left neural foraminal stenosis.   L4-L5: Small right asymmetric disc bulge, slightly worsened. No spinal canal stenosis. Unchanged mild bilateral neural foraminal stenosis.   L5-S1: Progression of severe facet arthrosis with increased anterolisthesis. Regression of  right subarticular disc herniation. Narrowing of the lateral recesses without central spinal canal stenosis. Unchanged moderate bilateral neural foraminal stenosis.   Visualized sacrum: Normal.   IMPRESSION: 1. Progression of severe  L5-S1 facet arthrosis with worsened anterolisthesis and unchanged moderate bilateral foraminal stenosis. 2. Progression of degenerative disc disease at L3-4 with worsened moderate left foraminal stenosis.     Electronically Signed   By: Ulyses Jarred M.D.   On: 06/16/2021 21:10     Objective:  VS:  HT:    WT:   BMI:     BP:(!) 143/73  HR:85bpm  TEMP: ( )  RESP:  Physical Exam   Imaging: No results found.

## 2021-07-31 ENCOUNTER — Ambulatory Visit: Payer: Self-pay

## 2021-07-31 ENCOUNTER — Ambulatory Visit (INDEPENDENT_AMBULATORY_CARE_PROVIDER_SITE_OTHER): Payer: Medicare Other | Admitting: Physical Medicine and Rehabilitation

## 2021-07-31 ENCOUNTER — Encounter: Payer: Self-pay | Admitting: Physical Medicine and Rehabilitation

## 2021-07-31 ENCOUNTER — Telehealth: Payer: Self-pay | Admitting: Physical Medicine and Rehabilitation

## 2021-07-31 VITALS — BP 127/84 | HR 74

## 2021-07-31 DIAGNOSIS — M47816 Spondylosis without myelopathy or radiculopathy, lumbar region: Secondary | ICD-10-CM | POA: Diagnosis not present

## 2021-07-31 MED ORDER — BUPIVACAINE HCL 0.25 % IJ SOLN
4.0000 mL | INTRAMUSCULAR | Status: AC | PRN
  Administered 2021-07-24: 4 mL via INTRA_ARTICULAR

## 2021-07-31 MED ORDER — METHYLPREDNISOLONE ACETATE 80 MG/ML IJ SUSP
80.0000 mg | Freq: Once | INTRAMUSCULAR | Status: AC
  Administered 2021-07-31: 80 mg

## 2021-07-31 MED ORDER — TRIAMCINOLONE ACETONIDE 40 MG/ML IJ SUSP
60.0000 mg | INTRAMUSCULAR | Status: AC | PRN
  Administered 2021-07-24: 60 mg via INTRA_ARTICULAR

## 2021-07-31 NOTE — Progress Notes (Unsigned)
Pt state lower back pain that travels to his Right hip. Pt state walking makes the pain worse. Pt state he takes over the counter pain meds to help ease his pain.  Numeric Pain Rating Scale and Functional Assessment Average Pain 4   In the last MONTH (on 0-10 scale) has pain interfered with the following?  1. General activity like being  able to carry out your everyday physical activities such as walking, climbing stairs, carrying groceries, or moving a chair?  Rating(9)   +Driver, -BT, -Dye Allergies.

## 2021-07-31 NOTE — Telephone Encounter (Signed)
Pt called back asking to be added to cancelation list for Dr. Ernestina Patches. Please add pt to list

## 2021-07-31 NOTE — Patient Instructions (Signed)

## 2021-08-04 DIAGNOSIS — S81802A Unspecified open wound, left lower leg, initial encounter: Secondary | ICD-10-CM | POA: Diagnosis not present

## 2021-08-11 ENCOUNTER — Telehealth: Payer: Self-pay | Admitting: Physical Medicine and Rehabilitation

## 2021-08-12 NOTE — Progress Notes (Signed)
Marc Gilbert - 88 y.o. male MRN [id number]  Date of birth: 22-Jun-1930  Office Visit Note: Visit Date: 07/31/2021 PCP: Merlene Laughter, MD Referred by: Merlene Laughter, MD  Subjective: Chief Complaint  Patient presents with   Lower Back - Pain   Left Hip - Pain   HPI:  Marc Gilbert is a 88 y.o. male who comes in today at the request of Dr. [name] for planned Right  L5-S1 Lumbar facet/medial branch block with fluoroscopic guidance.  The patient has failed conservative care including home exercise, medications, time and activity modification.  This injection will be diagnostic and hopefully therapeutic.  Please see requesting physician notes for further details and justification.  Exam has shown concordant pain with facet joint loading.  Previous left intra-articular hip injection was very successful and he is having hardly any pain at all in the left hip.  This is also diagnostic showing that he is having some pathology of the left hip.  Depending on length of relief with the left hip injection he will need to follow-up with Dr. Cleophas Dunker.   ROS Otherwise per HPI.  Assessment & Plan: Visit Diagnoses:    ICD-10-CM   1. Spondylosis without myelopathy or radiculopathy, lumbar region  M47.816 XR C-ARM NO REPORT    Facet Injection    methylPREDNISolone acetate (DEPO-MEDROL) injection 80 mg      Plan: No additional findings.   Meds & Orders:  Meds ordered this encounter  Medications   methylPREDNISolone acetate (DEPO-MEDROL) injection 80 mg    Orders Placed This Encounter  Procedures   Facet Injection   XR C-ARM NO REPORT    Follow-up: Return if symptoms worsen or fail to improve.   Procedures: No procedures performed  Lumbar Facet Joint Intra-Articular Injection(s) with Fluoroscopic Guidance  Patient: Marc Gilbert      Date of Birth: Nov 08, 1930 MRN: [id number] PCP: Merlene Laughter, MD      Visit Date: 07/31/2021   Universal Protocol:    Date/Time:  07/31/2021  Consent Given By: the patient  Position: PRONE   Additional Comments: Vital signs were monitored before and after the procedure. Patient was prepped and draped in the usual sterile fashion. The correct patient, procedure, and site was verified.   Injection Procedure Details:  Procedure Site One Meds Administered:  Meds ordered this encounter  Medications   methylPREDNISolone acetate (DEPO-MEDROL) injection 80 mg     Laterality: Right  Location/Site:  L5-S1  Needle size: 22 guage  Needle type: Spinal  Needle Placement: Articular  Findings:  -Comments: Excellent flow of contrast producing a partial arthrogram.  Procedure Details: The fluoroscope beam is vertically oriented in AP, and the inferior recess is visualized beneath the lower pole of the inferior apophyseal process, which represents the target point for needle insertion. When direct visualization is difficult the target point is located at the medial projection of the vertebral pedicle. The region overlying each aforementioned target is locally anesthetized with a 1 to 2 ml. volume of 1% Lidocaine without Epinephrine.   The spinal needle was inserted into each of the above mentioned facet joints using biplanar fluoroscopic guidance. A 0.25 to 0.5 ml. volume of Isovue-250 was injected and a partial facet joint arthrogram was obtained. A single spot film was obtained of the resulting arthrogram.    One to 1.25 ml of the steroid/anesthetic solution was then injected into each of the facet joints noted above.   Additional Comments:  The patient tolerated the procedure  well Dressing: 2 x 2 sterile gauze and Band-Aid    Post-procedure details: Patient was observed during the procedure. Post-procedure instructions were reviewed.  Patient left the clinic in stable condition.     Clinical History: MRI LUMBAR SPINE WITHOUT CONTRAST   TECHNIQUE: Multiplanar, multisequence MR imaging of the lumbar spine  was performed. No intravenous contrast was administered.   COMPARISON:  10/29/2005   FINDINGS: Segmentation:  Standard   Alignment: Grade 1 retrolisthesis at L2-3 and L3-4. Grade 1 anterolisthesis at L5-S1. S shaped scoliosis.   Vertebrae:  No fracture, evidence of discitis, or bone lesion.   Conus medullaris and cauda equina: Conus extends to the L1 level. Conus and cauda equina appear normal.   Paraspinal and other soft tissues: Fatty atrophy of the paraspinous muscles   Disc levels:   T12-L1: Small disc bulge without stenosis.   L1-L2: Progression of disc bulge with endplate spurring. No spinal canal stenosis. Mild left neural foraminal stenosis.   L2-L3: Small disc bulge, unchanged. No spinal canal stenosis. Mild left neural foraminal stenosis.   L3-L4: Progression of disc degeneration with small bulge and endplate spurring. Unchanged left lateral recess narrowing without central spinal canal stenosis. Progression of moderate left neural foraminal stenosis.   L4-L5: Small right asymmetric disc bulge, slightly worsened. No spinal canal stenosis. Unchanged mild bilateral neural foraminal stenosis.   L5-S1: Progression of severe facet arthrosis with increased anterolisthesis. Regression of right subarticular disc herniation. Narrowing of the lateral recesses without central spinal canal stenosis. Unchanged moderate bilateral neural foraminal stenosis.   Visualized sacrum: Normal.   IMPRESSION: 1. Progression of severe L5-S1 facet arthrosis with worsened anterolisthesis and unchanged moderate bilateral foraminal stenosis. 2. Progression of degenerative disc disease at L3-4 with worsened moderate left foraminal stenosis.     Electronically Signed   By: Deatra Robinson M.D.   On: 06/16/2021 21:10     Objective:  VS:  HT:    WT:   BMI:     BP:127/84  HR:74bpm  TEMP: ( )  RESP:  Physical Exam Vitals and nursing note reviewed.  Constitutional:       General: He is not in acute distress.    Appearance: Normal appearance. He is not ill-appearing.  HENT:     Head: Normocephalic and atraumatic.     Right Ear: External ear normal.     Left Ear: External ear normal.     Nose: No congestion.  Eyes:     Extraocular Movements: Extraocular movements intact.  Cardiovascular:     Rate and Rhythm: Normal rate.     Pulses: Normal pulses.  Pulmonary:     Effort: Pulmonary effort is normal. No respiratory distress.  Abdominal:     General: There is no distension.     Palpations: Abdomen is soft.  Musculoskeletal:        General: No tenderness or signs of injury.     Cervical back: Neck supple.     Right lower leg: No edema.     Left lower leg: No edema.     Comments: Patient has good distal strength without clonus. Patient somewhat slow to rise from a seated position to full extension.  There is concordant low back pain with facet loading and lumbar spine extension rotation.  There are no definitive trigger points but the patient is somewhat tender across the lower back and PSIS.  There is no pain with hip rotation.   Skin:    Findings: No erythema or rash.  Neurological:     General: No focal deficit present.     Mental Status: He is alert and oriented to person, place, and time.     Sensory: No sensory deficit.     Motor: No weakness or abnormal muscle tone.     Coordination: Coordination normal.  Psychiatric:        Mood and Affect: Mood normal.        Behavior: Behavior normal.      Imaging: No results found.

## 2021-08-20 ENCOUNTER — Ambulatory Visit (INDEPENDENT_AMBULATORY_CARE_PROVIDER_SITE_OTHER): Payer: Medicare Other | Admitting: Orthopaedic Surgery

## 2021-08-20 ENCOUNTER — Encounter: Payer: Self-pay | Admitting: Orthopaedic Surgery

## 2021-08-20 DIAGNOSIS — M7062 Trochanteric bursitis, left hip: Secondary | ICD-10-CM | POA: Diagnosis not present

## 2021-08-20 MED ORDER — BUPIVACAINE HCL 0.25 % IJ SOLN
2.0000 mL | INTRAMUSCULAR | Status: AC | PRN
  Administered 2021-08-20: 2 mL via INTRA_ARTICULAR

## 2021-08-20 MED ORDER — METHYLPREDNISOLONE ACETATE 40 MG/ML IJ SUSP
80.0000 mg | INTRAMUSCULAR | Status: AC | PRN
  Administered 2021-08-20: 80 mg via INTRA_ARTICULAR

## 2021-08-20 MED ORDER — LIDOCAINE HCL 1 % IJ SOLN
2.0000 mL | INTRAMUSCULAR | Status: AC | PRN
  Administered 2021-08-20: 2 mL

## 2021-08-20 NOTE — Progress Notes (Signed)
Office Visit Note   Patient: Marc Gilbert           Date of Birth: 1930/11/20           MRN: [id number] Visit Date: 08/20/2021              Requested by: Lajean Manes, MD [address] [place],  [place] [address] PCP: Lajean Manes, MD   Assessment & Plan: Visit Diagnoses:  1. Trochanteric bursitis, left hip     Plan: [name] has been seen by Dr. Ernestina Patches for an injection in his lumbar spine and his left hip.  He notes that both injections have made a difference but now is having localized pain directly over the greater trochanter of his left hip.  He was tender in that area without much pain with internal/external rotation so I am going to inject that 1 area with Depo-Medrol and monitor his response.  This does not appear to be referred from his back or his hip.  It has been so significant that he has been using a walker just recently  Follow-Up Instructions: Return if symptoms worsen or fail to improve.   Orders:  No orders of the defined types were placed in this encounter.  No orders of the defined types were placed in this encounter.     Procedures: Large Joint Inj: L greater trochanter on 08/20/2021 3:08 PM Indications: pain and diagnostic evaluation Details: 25 G 1.5 in needle, lateral approach  Arthrogram: No  Medications: 2 mL lidocaine 1 %; 80 mg methylPREDNISolone acetate 40 MG/ML; 2 mL bupivacaine 0.25 % Procedure, treatment alternatives, risks and benefits explained, specific risks discussed. Consent was given by the patient. Immediately prior to procedure a time out was called to verify the correct patient, procedure, equipment, support staff and site/side marked as required. Patient was prepped and draped in the usual sterile fashion.       Clinical Data: No additional findings.   Subjective: Chief Complaint  Patient presents with   Left Hip - Follow-up   Patient presents today for follow up of his left hip pain. Patient states that  he has received an injection on 07/24/2021 in his left hip with Dr. Ernestina Patches and then facet injection on 07/31/2021 with him as well. His left injection gave 60-80% if relief and states that he was able to use a cane to help walk for around 3 days. Patient shortly returned after and he has had to result back to using a walker. OTC tylenol is being used daily to help manage pain.   Review of Systems   Objective: Vital Signs: There were no vitals taken for this visit.  Physical Exam Constitutional:      Appearance: He is well-developed.  Pulmonary:     Effort: Pulmonary effort is normal.  Skin:    General: Skin is warm and dry.  Neurological:     Mental Status: He is alert and oriented to person, place, and time.  Psychiatric:        Behavior: Behavior normal.     Ortho Exam left hip with localized tenderness directly over the greater trochanter to hip.  Skin intact.  Straight leg raise negative.  No percussible tenderness of his lumbar spine.  Did not seem to have much pain with internal/external rotation of his hip and specifically no pain referred to the greater trochanter  Specialty Comments:  MRI LUMBAR SPINE WITHOUT CONTRAST   TECHNIQUE: Multiplanar, multisequence MR imaging of the  lumbar spine was performed. No intravenous contrast was administered.   COMPARISON:  10/29/2005   FINDINGS: Segmentation:  Standard   Alignment: Grade 1 retrolisthesis at L2-3 and L3-4. Grade 1 anterolisthesis at L5-S1. S shaped scoliosis.   Vertebrae:  No fracture, evidence of discitis, or bone lesion.   Conus medullaris and cauda equina: Conus extends to the L1 level. Conus and cauda equina appear normal.   Paraspinal and other soft tissues: Fatty atrophy of the paraspinous muscles   Disc levels:   T12-L1: Small disc bulge without stenosis.   L1-L2: Progression of disc bulge with endplate spurring. No spinal canal stenosis. Mild left neural foraminal stenosis.   L2-L3: Small disc  bulge, unchanged. No spinal canal stenosis. Mild left neural foraminal stenosis.   L3-L4: Progression of disc degeneration with small bulge and endplate spurring. Unchanged left lateral recess narrowing without central spinal canal stenosis. Progression of moderate left neural foraminal stenosis.   L4-L5: Small right asymmetric disc bulge, slightly worsened. No spinal canal stenosis. Unchanged mild bilateral neural foraminal stenosis.   L5-S1: Progression of severe facet arthrosis with increased anterolisthesis. Regression of right subarticular disc herniation. Narrowing of the lateral recesses without central spinal canal stenosis. Unchanged moderate bilateral neural foraminal stenosis.   Visualized sacrum: Normal.   IMPRESSION: 1. Progression of severe L5-S1 facet arthrosis with worsened anterolisthesis and unchanged moderate bilateral foraminal stenosis. 2. Progression of degenerative disc disease at L3-4 with worsened moderate left foraminal stenosis.     Electronically Signed   By: Ulyses Jarred M.D.   On: 06/16/2021 21:10  Imaging: No results found.   PMFS History: Patient Active Problem List   Diagnosis Date Noted   Trochanteric bursitis, left hip 08/20/2021   Unilateral primary osteoarthritis, left hip 05/27/2021   Low back pain 05/27/2021   DVT (deep venous thrombosis) ([facility]) 07/02/2020   S/P reverse total shoulder arthroplasty, right 06/18/2020   Osteoarthritis of right shoulder 05/16/2020   Malignant neoplasm of prostate ([facility]) 04/25/2019   Dyslipidemia 01/03/2019   Pain in right shoulder 09/20/2018   Unilateral primary osteoarthritis, right knee 09/20/2018   Bilateral hearing loss 12/07/2016   Bilateral impacted cerumen 12/07/2016   Presence of aortocoronary bypass graft 09/17/2016   Coronary artery disease of native heart with stable angina pectoris ([facility]) 09/17/2016   Unstable angina (HCC) 09/17/2016   Hx of CABG 08/14/2016   Chest pain 08/10/2016    CKD (chronic kidney disease) stage 3, GFR 30-59 ml/min (HCC) 08/30/2015   Hyperlipidemia 08/30/2015   Osteoarthritis of hip 05/01/2011   Hypertension 05/01/2011   COPD (chronic obstructive pulmonary disease) ([facility]) 05/01/2011   Cancer of skin, face 05/01/2011   Past Medical History:  Diagnosis Date   Arthritis    "right hand; back" (08/30/2015)   Cancer (HCC)    skin  squamous and basal   Chronic lower back pain    CKD (chronic kidney disease), stage III (HCC)    Stage III   COPD (chronic obstructive pulmonary disease) ([facility])    no home O2   Coronary artery disease    Dysplastic colon polyp age 31   carcinoma in situ   GERD (gastroesophageal reflux disease)    occ   H/O hiatal hernia    Hyperlipidemia    Hypertension    Prostate cancer ([facility]) 07/2014   active surveillance, Glisson 6    Family History  Problem Relation Age of Onset   Breast cancer Mother 108       mastectomy   Diabetes  Father    Breast cancer Daughter        2005. Lumpectomy with chemo and radiation. Under the care of Dr. Jana Hakim     Pancreatic cancer Neg Hx    Colon cancer Neg Hx    Prostate cancer Neg Hx     Past Surgical History:  Procedure Laterality Date   CATARACT EXTRACTION W/ INTRAOCULAR LENS IMPLANT Left 08/27/15   CORONARY ARTERY BYPASS GRAFT N/A 08/14/2016   Procedure: CORONARY ARTERY BYPASS GRAFTING (CABG)x2, using left internal mammary artery and right greater saphenous vein harvested endoscopically;  Surgeon: Gaye Pollack, MD;  Location: [facility];  Service: Open Heart Surgery;  Laterality: N/A;   JOINT REPLACEMENT     LEFT HEART CATH AND CORONARY ANGIOGRAPHY N/A 08/11/2016   Procedure: Left Heart Cath and Coronary Angiography;  Surgeon: Belva Crome, MD;  Location: [facility];  Service: Cardiovascular;  Laterality: N/A;   MOHS SURGERY     multiple SCC   PROSTATE BIOPSY  07/2014   TEE WITHOUT CARDIOVERSION N/A 08/14/2016   Procedure: TRANSESOPHAGEAL ECHOCARDIOGRAM (TEE);  Surgeon:  Gaye Pollack, MD;  Location: [facility];  Service: Open Heart Surgery;  Laterality: N/A;   TONSILLECTOMY     TOTAL HIP ARTHROPLASTY  05/26/2011   Procedure: TOTAL HIP ARTHROPLASTY;  Surgeon: Garald Balding, MD;  Location: [facility];  Service: Orthopedics;  Laterality: Right;   TOTAL SHOULDER ARTHROPLASTY Right 06/18/2020   Procedure: TOTAL SHOULDER ARTHROPLASTY;  Surgeon: Marchia Bond, MD;  Location: [facility];  Service: Orthopedics;  Laterality: Right;   ULTRASOUND GUIDANCE FOR VASCULAR ACCESS  08/11/2016   Procedure: Ultrasound Guidance For Vascular Access;  Surgeon: Belva Crome, MD;  Location: [facility];  Service: Cardiovascular;;   Social History   Occupational History   Occupation: retired 1996  Tobacco Use   Smoking status: Former    Packs/day: 0.50    Years: 25.00    Total pack years: 12.50    Types: Cigarettes    Quit date: 05/24/1988    Years since quitting: 33.2   Smokeless tobacco: Never  Vaping Use   Vaping Use: Never used  Substance and Sexual Activity   Alcohol use: Yes    Alcohol/week: 10.0 standard drinks of alcohol    Types: 5 Glasses of wine, 5 Cans of beer per week    Comment: daily   Drug use: No   Sexual activity: Not Currently

## 2021-08-25 ENCOUNTER — Encounter: Payer: Self-pay | Admitting: Orthopaedic Surgery

## 2021-08-26 ENCOUNTER — Encounter: Payer: Medicare Other | Admitting: Physical Medicine and Rehabilitation

## 2021-08-26 DIAGNOSIS — Z48817 Encounter for surgical aftercare following surgery on the skin and subcutaneous tissue: Secondary | ICD-10-CM | POA: Diagnosis not present

## 2021-08-26 DIAGNOSIS — L97829 Non-pressure chronic ulcer of other part of left lower leg with unspecified severity: Secondary | ICD-10-CM | POA: Diagnosis not present

## 2021-08-26 DIAGNOSIS — S81802A Unspecified open wound, left lower leg, initial encounter: Secondary | ICD-10-CM | POA: Diagnosis not present

## 2021-08-26 DIAGNOSIS — Z85828 Personal history of other malignant neoplasm of skin: Secondary | ICD-10-CM | POA: Diagnosis not present

## 2021-08-29 DIAGNOSIS — N1831 Chronic kidney disease, stage 3a: Secondary | ICD-10-CM | POA: Diagnosis not present

## 2021-08-29 DIAGNOSIS — Z79899 Other long term (current) drug therapy: Secondary | ICD-10-CM | POA: Diagnosis not present

## 2021-08-29 DIAGNOSIS — J449 Chronic obstructive pulmonary disease, unspecified: Secondary | ICD-10-CM | POA: Diagnosis not present

## 2021-08-29 DIAGNOSIS — E7800 Pure hypercholesterolemia, unspecified: Secondary | ICD-10-CM | POA: Diagnosis not present

## 2021-08-29 DIAGNOSIS — I700 Atherosclerosis of aorta: Secondary | ICD-10-CM | POA: Diagnosis not present

## 2021-08-29 DIAGNOSIS — I129 Hypertensive chronic kidney disease with stage 1 through stage 4 chronic kidney disease, or unspecified chronic kidney disease: Secondary | ICD-10-CM | POA: Diagnosis not present

## 2021-09-02 ENCOUNTER — Telehealth: Payer: Self-pay | Admitting: Orthopaedic Surgery

## 2021-09-02 DIAGNOSIS — Z85828 Personal history of other malignant neoplasm of skin: Secondary | ICD-10-CM | POA: Diagnosis not present

## 2021-09-02 DIAGNOSIS — Z48817 Encounter for surgical aftercare following surgery on the skin and subcutaneous tissue: Secondary | ICD-10-CM | POA: Diagnosis not present

## 2021-09-02 DIAGNOSIS — M7062 Trochanteric bursitis, left hip: Secondary | ICD-10-CM

## 2021-09-02 DIAGNOSIS — C44729 Squamous cell carcinoma of skin of left lower limb, including hip: Secondary | ICD-10-CM | POA: Diagnosis not present

## 2021-09-02 DIAGNOSIS — S81802A Unspecified open wound, left lower leg, initial encounter: Secondary | ICD-10-CM | POA: Diagnosis not present

## 2021-09-02 NOTE — Telephone Encounter (Signed)
Brooke (PT from [facility]) is calling requesting verbal PT orders for Marc Gilbert. The orders requested are as follows:  Physical therapy for pain manage, balance training, gait and stability.   Fax#: [phone] if you prefer to send the orders via fax  Please advise.

## 2021-09-02 NOTE — Telephone Encounter (Signed)
Ok to send prescription for PT as outlined

## 2021-09-02 NOTE — Telephone Encounter (Signed)
Order has been created

## 2021-09-03 DIAGNOSIS — R262 Difficulty in walking, not elsewhere classified: Secondary | ICD-10-CM | POA: Diagnosis not present

## 2021-09-03 DIAGNOSIS — M25552 Pain in left hip: Secondary | ICD-10-CM | POA: Diagnosis not present

## 2021-09-05 DIAGNOSIS — M25552 Pain in left hip: Secondary | ICD-10-CM | POA: Diagnosis not present

## 2021-09-05 DIAGNOSIS — R262 Difficulty in walking, not elsewhere classified: Secondary | ICD-10-CM | POA: Diagnosis not present

## 2021-09-08 DIAGNOSIS — M25552 Pain in left hip: Secondary | ICD-10-CM | POA: Diagnosis not present

## 2021-09-08 DIAGNOSIS — R262 Difficulty in walking, not elsewhere classified: Secondary | ICD-10-CM | POA: Diagnosis not present

## 2021-09-09 DIAGNOSIS — Z85828 Personal history of other malignant neoplasm of skin: Secondary | ICD-10-CM | POA: Diagnosis not present

## 2021-09-09 DIAGNOSIS — S81802A Unspecified open wound, left lower leg, initial encounter: Secondary | ICD-10-CM | POA: Diagnosis not present

## 2021-09-09 DIAGNOSIS — C44729 Squamous cell carcinoma of skin of left lower limb, including hip: Secondary | ICD-10-CM | POA: Diagnosis not present

## 2021-09-09 DIAGNOSIS — I872 Venous insufficiency (chronic) (peripheral): Secondary | ICD-10-CM | POA: Diagnosis not present

## 2021-09-15 DIAGNOSIS — R262 Difficulty in walking, not elsewhere classified: Secondary | ICD-10-CM | POA: Diagnosis not present

## 2021-09-15 DIAGNOSIS — M25552 Pain in left hip: Secondary | ICD-10-CM | POA: Diagnosis not present

## 2021-09-16 DIAGNOSIS — I872 Venous insufficiency (chronic) (peripheral): Secondary | ICD-10-CM | POA: Diagnosis not present

## 2021-09-16 DIAGNOSIS — Z85828 Personal history of other malignant neoplasm of skin: Secondary | ICD-10-CM | POA: Diagnosis not present

## 2021-09-16 DIAGNOSIS — C44729 Squamous cell carcinoma of skin of left lower limb, including hip: Secondary | ICD-10-CM | POA: Diagnosis not present

## 2021-09-16 DIAGNOSIS — S81802D Unspecified open wound, left lower leg, subsequent encounter: Secondary | ICD-10-CM | POA: Diagnosis not present

## 2021-09-17 DIAGNOSIS — M25552 Pain in left hip: Secondary | ICD-10-CM | POA: Diagnosis not present

## 2021-09-17 DIAGNOSIS — R262 Difficulty in walking, not elsewhere classified: Secondary | ICD-10-CM | POA: Diagnosis not present

## 2021-09-22 DIAGNOSIS — R262 Difficulty in walking, not elsewhere classified: Secondary | ICD-10-CM | POA: Diagnosis not present

## 2021-09-22 DIAGNOSIS — M25552 Pain in left hip: Secondary | ICD-10-CM | POA: Diagnosis not present

## 2021-09-23 DIAGNOSIS — S81802A Unspecified open wound, left lower leg, initial encounter: Secondary | ICD-10-CM | POA: Diagnosis not present

## 2021-09-29 DIAGNOSIS — R262 Difficulty in walking, not elsewhere classified: Secondary | ICD-10-CM | POA: Diagnosis not present

## 2021-09-29 DIAGNOSIS — M25552 Pain in left hip: Secondary | ICD-10-CM | POA: Diagnosis not present

## 2021-10-02 DIAGNOSIS — R262 Difficulty in walking, not elsewhere classified: Secondary | ICD-10-CM | POA: Diagnosis not present

## 2021-10-02 DIAGNOSIS — M25552 Pain in left hip: Secondary | ICD-10-CM | POA: Diagnosis not present

## 2021-10-07 ENCOUNTER — Encounter: Payer: Self-pay | Admitting: Orthopaedic Surgery

## 2021-10-07 DIAGNOSIS — M25552 Pain in left hip: Secondary | ICD-10-CM | POA: Diagnosis not present

## 2021-10-07 DIAGNOSIS — R262 Difficulty in walking, not elsewhere classified: Secondary | ICD-10-CM | POA: Diagnosis not present

## 2021-10-09 DIAGNOSIS — M25552 Pain in left hip: Secondary | ICD-10-CM | POA: Diagnosis not present

## 2021-10-09 DIAGNOSIS — R262 Difficulty in walking, not elsewhere classified: Secondary | ICD-10-CM | POA: Diagnosis not present

## 2021-10-13 DIAGNOSIS — M79674 Pain in right toe(s): Secondary | ICD-10-CM | POA: Diagnosis not present

## 2021-10-13 DIAGNOSIS — B351 Tinea unguium: Secondary | ICD-10-CM | POA: Diagnosis not present

## 2021-10-13 DIAGNOSIS — M79675 Pain in left toe(s): Secondary | ICD-10-CM | POA: Diagnosis not present

## 2021-10-17 ENCOUNTER — Ambulatory Visit: Payer: Medicare Other | Admitting: Cardiology

## 2021-10-24 DIAGNOSIS — C61 Malignant neoplasm of prostate: Secondary | ICD-10-CM | POA: Diagnosis not present

## 2021-10-28 DIAGNOSIS — M25552 Pain in left hip: Secondary | ICD-10-CM | POA: Diagnosis not present

## 2021-10-28 DIAGNOSIS — R262 Difficulty in walking, not elsewhere classified: Secondary | ICD-10-CM | POA: Diagnosis not present

## 2021-10-29 DIAGNOSIS — U099 Post covid-19 condition, unspecified: Secondary | ICD-10-CM | POA: Diagnosis not present

## 2021-10-29 DIAGNOSIS — R053 Chronic cough: Secondary | ICD-10-CM | POA: Diagnosis not present

## 2021-11-04 DIAGNOSIS — R262 Difficulty in walking, not elsewhere classified: Secondary | ICD-10-CM | POA: Diagnosis not present

## 2021-11-04 DIAGNOSIS — M25552 Pain in left hip: Secondary | ICD-10-CM | POA: Diagnosis not present

## 2021-11-07 DIAGNOSIS — R351 Nocturia: Secondary | ICD-10-CM | POA: Diagnosis not present

## 2021-11-07 DIAGNOSIS — N401 Enlarged prostate with lower urinary tract symptoms: Secondary | ICD-10-CM | POA: Diagnosis not present

## 2021-11-11 DIAGNOSIS — R262 Difficulty in walking, not elsewhere classified: Secondary | ICD-10-CM | POA: Diagnosis not present

## 2021-11-11 DIAGNOSIS — M25552 Pain in left hip: Secondary | ICD-10-CM | POA: Diagnosis not present

## 2021-11-19 DIAGNOSIS — M25552 Pain in left hip: Secondary | ICD-10-CM | POA: Diagnosis not present

## 2021-11-19 DIAGNOSIS — R262 Difficulty in walking, not elsewhere classified: Secondary | ICD-10-CM | POA: Diagnosis not present

## 2021-11-21 DIAGNOSIS — M25552 Pain in left hip: Secondary | ICD-10-CM | POA: Diagnosis not present

## 2021-11-21 DIAGNOSIS — R262 Difficulty in walking, not elsewhere classified: Secondary | ICD-10-CM | POA: Diagnosis not present

## 2021-11-24 DIAGNOSIS — M25552 Pain in left hip: Secondary | ICD-10-CM | POA: Diagnosis not present

## 2021-11-24 DIAGNOSIS — R262 Difficulty in walking, not elsewhere classified: Secondary | ICD-10-CM | POA: Diagnosis not present

## 2021-11-25 ENCOUNTER — Encounter: Payer: Self-pay | Admitting: Orthopaedic Surgery

## 2021-11-25 ENCOUNTER — Ambulatory Visit: Payer: Medicare Other | Admitting: Orthopaedic Surgery

## 2021-11-25 ENCOUNTER — Ambulatory Visit (INDEPENDENT_AMBULATORY_CARE_PROVIDER_SITE_OTHER): Payer: Medicare Other | Admitting: Orthopaedic Surgery

## 2021-11-25 DIAGNOSIS — M1612 Unilateral primary osteoarthritis, left hip: Secondary | ICD-10-CM | POA: Diagnosis not present

## 2021-11-25 NOTE — Progress Notes (Signed)
Office Visit Note   Patient: Marc Gilbert           Date of Birth: 21-Mar-1930           MRN: [id number] Visit Date: 11/25/2021              Requested by: Lajean Manes, MD [address],  [place] [address] PCP: Lajean Manes, MD   Assessment & Plan: Visit Diagnoses:  1. Primary osteoarthritis of left hip   2. Unilateral primary osteoarthritis, left hip     Plan: Mr. Bunda is accompanied by his wife and daughter, Dr. Margaretha Glassing.  He is here for follow-up evaluation of his left hip.  Prior films demonstrate end-stage osteoarthritis.  He is reached a point where he is having more more difficulty with activities of daily living and has progressed from a cane to a rolling walker.  I did inject the greater trochanteric region on his last visit in July which did not really give him much relief.  He is at the point where he really wants to discuss total hip replacement.  Prior films in April of this year demonstrated end-stage osteoarthritis with little if any joint space remaining, subchondral sclerosis, subchondral cyst on both sides of the joint and large peripheral osteophytes.  He has minimal motion from a neutral position.  He also has an issue with his back and has had injections but finds that his hip really is compromising his activities of daily living.  I had a long discussion with him today regarding all the potential problems referable to her hip replacement but he still is interested in proceeding.  He has an appointment to see his cardiologist in 2 days and we will await his assessment.  I discussed many of the potential problems eluding further issues with his heart, lungs.  I will refer him to Dr. Ninfa Linden.  All questions were answered .he will have excellent postop rehabilitation process at [facility] where he is residing.  Follow-Up Instructions: Return Refer to Dr. Ninfa Linden for consideration of left total hip replacement.   Orders:  No orders of the defined types were  placed in this encounter.  No orders of the defined types were placed in this encounter.     Procedures: No procedures performed   Clinical Data: No additional findings.   Subjective: Chief Complaint  Patient presents with   Left Hip - Pain  Follow-up for problems referable to his left hip.  Has had prior films demonstrating end-stage osteoarthritis.  Interested in pursuing hip replacement has a history of coronary artery bypass graft and is being followed by Dr. Percival Spanish will be seeing him in 2 days who can assess his potential risks for surgery  HPI  Review of Systems   Objective: Vital Signs: There were no vitals taken for this visit.  Physical Exam Constitutional:      Appearance: He is well-developed.  Pulmonary:     Effort: Pulmonary effort is normal.  Skin:    General: Skin is warm and dry.  Neurological:     Mental Status: He is alert and oriented to person, place, and time.  Psychiatric:        Behavior: Behavior normal.     Ortho Exam awake alert and oriented x3.  Comfortable sitting.  Not using a rolling walker for ambulation.  Little if any motion of his left hip from a neutral position and pain beyond 5 degrees of both internal and external rotation.  No pain over the greater trochanter.  Has had a skin lesion that finally healed after about 8 months along the anterior tibia region of the same leg.  Its not red or draining or open.  Motor exam intact.  Straight leg raise negative.  No percussible tenderness lumbar spine  Specialty Comments:  MRI LUMBAR SPINE WITHOUT CONTRAST   TECHNIQUE: Multiplanar, multisequence MR imaging of the lumbar spine was performed. No intravenous contrast was administered.   COMPARISON:  10/29/2005   FINDINGS: Segmentation:  Standard   Alignment: Grade 1 retrolisthesis at L2-3 and L3-4. Grade 1 anterolisthesis at L5-S1. S shaped scoliosis.   Vertebrae:  No fracture, evidence of discitis, or bone lesion.   Conus  medullaris and cauda equina: Conus extends to the L1 level. Conus and cauda equina appear normal.   Paraspinal and other soft tissues: Fatty atrophy of the paraspinous muscles   Disc levels:   T12-L1: Small disc bulge without stenosis.   L1-L2: Progression of disc bulge with endplate spurring. No spinal canal stenosis. Mild left neural foraminal stenosis.   L2-L3: Small disc bulge, unchanged. No spinal canal stenosis. Mild left neural foraminal stenosis.   L3-L4: Progression of disc degeneration with small bulge and endplate spurring. Unchanged left lateral recess narrowing without central spinal canal stenosis. Progression of moderate left neural foraminal stenosis.   L4-L5: Small right asymmetric disc bulge, slightly worsened. No spinal canal stenosis. Unchanged mild bilateral neural foraminal stenosis.   L5-S1: Progression of severe facet arthrosis with increased anterolisthesis. Regression of right subarticular disc herniation. Narrowing of the lateral recesses without central spinal canal stenosis. Unchanged moderate bilateral neural foraminal stenosis.   Visualized sacrum: Normal.   IMPRESSION: 1. Progression of severe L5-S1 facet arthrosis with worsened anterolisthesis and unchanged moderate bilateral foraminal stenosis. 2. Progression of degenerative disc disease at L3-4 with worsened moderate left foraminal stenosis.     Electronically Signed   By: Ulyses Jarred M.D.   On: 06/16/2021 21:10  Imaging: No results found.   PMFS History: Patient Active Problem List   Diagnosis Date Noted   Trochanteric bursitis, left hip 08/20/2021   Unilateral primary osteoarthritis, left hip 05/27/2021   Low back pain 05/27/2021   DVT (deep venous thrombosis) ([facility]) 07/02/2020   S/P reverse total shoulder arthroplasty, right 06/18/2020   Osteoarthritis of right shoulder 05/16/2020   Malignant neoplasm of prostate ([facility]) 04/25/2019   Dyslipidemia 01/03/2019   Pain in right  shoulder 09/20/2018   Unilateral primary osteoarthritis, right knee 09/20/2018   Bilateral hearing loss 12/07/2016   Bilateral impacted cerumen 12/07/2016   Presence of aortocoronary bypass graft 09/17/2016   Coronary artery disease of native heart with stable angina pectoris ([facility]) 09/17/2016   Unstable angina ([facility]) 09/17/2016   Hx of CABG 08/14/2016   Chest pain 08/10/2016   CKD (chronic kidney disease) stage 3, GFR 30-59 ml/min (HCC) 08/30/2015   Hyperlipidemia 08/30/2015   Osteoarthritis of hip 05/01/2011   Hypertension 05/01/2011   COPD (chronic obstructive pulmonary disease) ([facility]) 05/01/2011   Cancer of skin, face 05/01/2011   Past Medical History:  Diagnosis Date   Arthritis    "right hand; back" (08/30/2015)   Cancer (HCC)    skin  squamous and basal   Chronic lower back pain    CKD (chronic kidney disease), stage III (HCC)    Stage III   COPD (chronic obstructive pulmonary disease) (HCC)    no home O2   Coronary artery disease    Dysplastic colon polyp age  10   carcinoma in situ   GERD (gastroesophageal reflux disease)    occ   H/O hiatal hernia    Hyperlipidemia    Hypertension    Prostate cancer ([facility]) 07/2014   active surveillance, Glisson 6    Family History  Problem Relation Age of Onset   Breast cancer Mother 47       mastectomy   Diabetes Father    Breast cancer Daughter        2005. Lumpectomy with chemo and radiation. Under the care of Dr. Jana Hakim     Pancreatic cancer Neg Hx    Colon cancer Neg Hx    Prostate cancer Neg Hx     Past Surgical History:  Procedure Laterality Date   CATARACT EXTRACTION W/ INTRAOCULAR LENS IMPLANT Left 08/27/15   CORONARY ARTERY BYPASS GRAFT N/A 08/14/2016   Procedure: CORONARY ARTERY BYPASS GRAFTING (CABG)x2, using left internal mammary artery and right greater saphenous vein harvested endoscopically;  Surgeon: Gaye Pollack, MD;  Location: [facility];  Service: Open Heart Surgery;  Laterality: N/A;   JOINT REPLACEMENT      LEFT HEART CATH AND CORONARY ANGIOGRAPHY N/A 08/11/2016   Procedure: Left Heart Cath and Coronary Angiography;  Surgeon: Belva Crome, MD;  Location: [facility];  Service: Cardiovascular;  Laterality: N/A;   MOHS SURGERY     multiple SCC   PROSTATE BIOPSY  07/2014   TEE WITHOUT CARDIOVERSION N/A 08/14/2016   Procedure: TRANSESOPHAGEAL ECHOCARDIOGRAM (TEE);  Surgeon: Gaye Pollack, MD;  Location: [facility];  Service: Open Heart Surgery;  Laterality: N/A;   TONSILLECTOMY     TOTAL HIP ARTHROPLASTY  05/26/2011   Procedure: TOTAL HIP ARTHROPLASTY;  Surgeon: Garald Balding, MD;  [facility];  Service: Orthopedics;  Laterality: Right;   TOTAL SHOULDER ARTHROPLASTY Right 06/18/2020   Procedure: TOTAL SHOULDER ARTHROPLASTY;  Surgeon: Marchia Bond, MD;  [facility];  Service: Orthopedics;  Laterality: Right;   ULTRASOUND GUIDANCE FOR VASCULAR ACCESS  08/11/2016   Procedure: Ultrasound Guidance For Vascular Access;  Surgeon: Belva Crome, MD;  Location: [facility];  Service: Cardiovascular;;   Social History   Occupational History   Occupation: retired 1996  Tobacco Use   Smoking status: Former    Packs/day: 0.50    Years: 25.00    Total pack years: 12.50    Types: Cigarettes    Quit date: 05/24/1988    Years since quitting: 33.5   Smokeless tobacco: Never  Vaping Use   Vaping Use: Never used  Substance and Sexual Activity   Alcohol use: Yes    Alcohol/week: 10.0 standard drinks of alcohol    Types: 5 Glasses of wine, 5 Cans of beer per week    Comment: daily   Drug use: No   Sexual activity: Not Currently     Garald Balding, MD   Note - This record has been created using [employer].  Chart creation errors have been sought, but may not always  have been located. Such creation errors do not reflect on  the standard of medical care.

## 2021-11-26 ENCOUNTER — Other Ambulatory Visit: Payer: Self-pay | Admitting: Cardiology

## 2021-11-26 DIAGNOSIS — M25552 Pain in left hip: Secondary | ICD-10-CM | POA: Diagnosis not present

## 2021-11-26 DIAGNOSIS — R262 Difficulty in walking, not elsewhere classified: Secondary | ICD-10-CM | POA: Diagnosis not present

## 2021-11-26 NOTE — Progress Notes (Signed)
Cardiology Office Note   Date:  11/27/2021   ID:  Marc Gilbert, DOB [birth year], MRN [id number]  PCP:  Lajean Manes, MD  Cardiologist:   Minus Breeding, MD    Chief Complaint  Patient presents with   Coronary Artery Disease     History of Present Illness: Marc Gilbert is a 88 y.o. male who presents for follow up of CAD/CABG.    He had CABG 08/14/2016 w/ LIMA-LAD and SVG-OM.    He is considering having hip surgery.  He has very limited by hip pain.  He is now using a rollator.  He denies any chest pressure, neck or arm discomfort.  He has not had any of the symptoms that he had prior to his CABG.  He is limited to walking with his walker.  However, he does this without bringing on any new symptoms.  He has some chronic dyspnea on exertion but this does not seem to be a particular problematic.  He has not had any new PND or orthopnea.   Past Medical History:  Diagnosis Date   Arthritis    "right hand; back" (08/30/2015)   Cancer (HCC)    skin  squamous and basal   Chronic lower back pain    CKD (chronic kidney disease), stage III (HCC)    Stage III   COPD (chronic obstructive pulmonary disease) (HCC)    no home O2   Coronary artery disease    Dysplastic colon polyp age 48   carcinoma in situ   GERD (gastroesophageal reflux disease)    occ   H/O hiatal hernia    Hyperlipidemia    Hypertension    Prostate cancer ([place]) 07/2014   active surveillance, [place] 6    Past Surgical History:  Procedure Laterality Date   CATARACT EXTRACTION W/ INTRAOCULAR LENS IMPLANT Left 08/27/15   CORONARY ARTERY BYPASS GRAFT N/A 08/14/2016   Procedure: CORONARY ARTERY BYPASS GRAFTING (CABG)x2, using left internal mammary artery and right greater saphenous vein harvested endoscopically;  Surgeon: Gaye Pollack, MD;  Location: [facility];  Service: Open Heart Surgery;  Laterality: N/A;   JOINT REPLACEMENT     LEFT HEART CATH AND CORONARY ANGIOGRAPHY N/A 08/11/2016   Procedure: Left Heart  Cath and Coronary Angiography;  Surgeon: Belva Crome, MD;  Location: [facility];  Service: Cardiovascular;  Laterality: N/A;   MOHS SURGERY     multiple SCC   PROSTATE BIOPSY  07/2014   TEE WITHOUT CARDIOVERSION N/A 08/14/2016   Procedure: TRANSESOPHAGEAL ECHOCARDIOGRAM (TEE);  Surgeon: Gaye Pollack, MD;  Location: [facility];  Service: Open Heart Surgery;  Laterality: N/A;   TONSILLECTOMY     TOTAL HIP ARTHROPLASTY  05/26/2011   Procedure: TOTAL HIP ARTHROPLASTY;  Surgeon: Garald Balding, MD;  Location: [facility];  Service: Orthopedics;  Laterality: Right;   TOTAL SHOULDER ARTHROPLASTY Right 06/18/2020   Procedure: TOTAL SHOULDER ARTHROPLASTY;  Surgeon: Marchia Bond, MD;  Location: [facility];  Service: Orthopedics;  Laterality: Right;   ULTRASOUND GUIDANCE FOR VASCULAR ACCESS  08/11/2016   Procedure: Ultrasound Guidance For Vascular Access;  Surgeon: Belva Crome, MD;  Location: [facility];  Service: Cardiovascular;;     Current Outpatient Medications  Medication Sig Dispense Refill   amoxicillin (AMOXIL) 500 MG tablet Take 2,000 mg by mouth See admin instructions. Take prior to dental visits.     aspirin 81 MG tablet Take 81 mg by mouth daily.  diclofenac Sodium (VOLTAREN) 1 % GEL Apply 1 application topically daily.     docusate sodium (COLACE) 250 MG capsule Take 250 mg by mouth daily.     gabapentin (NEURONTIN) 300 MG capsule Take 300 mg by mouth 3 (three) times daily.     HYDROcodone-acetaminophen (NORCO) 5-325 MG tablet Take 1 tablet by mouth every 6 (six) hours as needed for moderate pain. MAXIMUM TOTAL ACETAMINOPHEN DOSE IS 4000 MG PER DAY 20 tablet 0   melatonin 3 MG TABS tablet 1 tablet at bedtime as needed     Multiple Vitamins-Minerals (ONE-A-DAY MENS 50+ ADVANTAGE) TABS Take 1 tablet by mouth daily with breakfast.     Omega-3 Fatty Acids (FISH OIL OMEGA-3 PO) Take 2 capsules by mouth daily. 980 mg per cap     oxybutynin (DITROPAN) 5 MG tablet Take 5 mg by mouth  daily.     polyethylene glycol (MIRALAX / GLYCOLAX) 17 g packet Take 8.5 g by mouth daily.     Tamsulosin HCl (FLOMAX) 0.4 MG CAPS Take 0.4 mg by mouth daily.     atorvastatin (LIPITOR) 40 MG tablet Take 1 tablet (40 mg total) by mouth daily. 90 tablet 3   metoprolol succinate (TOPROL-XL) 50 MG 24 hr tablet Take 1 tablet (50 mg total) by mouth 2 (two) times daily. 180 tablet 3   No current facility-administered medications for this visit.    Allergies:   Patient has no known allergies.    ROS:  Please see the history of present illness.   Otherwise, review of systems are positive for none.   All other systems are reviewed and negative.    PHYSICAL EXAM: VS:  BP 132/82   Pulse 74   Ht 6' (1.829 m)   Wt 216 lb 6.4 oz (98.2 kg)   SpO2 94%   BMI 29.35 kg/m  , BMI Body mass index is 29.35 kg/m. GENERAL:  Well appearing NECK:  No jugular venous distention, waveform within normal limits, carotid upstroke brisk and symmetric, no bruits, no thyromegaly LUNGS:  Clear to auscultation bilaterally CHEST:  Well healed sternotomy scar. HEART:  PMI not displaced or sustained,S1 and S2 within normal limits, no S3, no S4, no clicks, no rubs, no murmurs ABD:  Flat, positive bowel sounds normal in frequency in pitch, no bruits, no rebound, no guarding, no midline pulsatile mass, no hepatomegaly, no splenomegaly EXT:  2 plus pulses throughout, no edema, no cyanosis no clubbing   EKG:  EKG is  ordered today. Sinus rhythm, rate 74, right bundle branch block, no acute ST-T wave changes.   Recent Labs: No results found for requested labs within last 365 days.   Wt Readings from Last 3 Encounters:  11/27/21 216 lb 6.4 oz (98.2 kg)  05/27/21 210 lb (95.3 kg)  10/08/20 207 lb 9.6 oz (94.2 kg)      Other studies Reviewed: Additional studies/ records that were reviewed today include: Labs Review of the above records demonstrates: See elsewhere   ASSESSMENT AND PLAN:   CAD:  The patient has  no new sypmtoms.  No further cardiovascular testing is indicated.  We will continue with aggressive risk reduction and meds as listed.  HTN: The blood pressure is at target.  No change in therapy.    DYSLIPIDEMIA:    LDL was 74.  LDL was also in the 70s.  No change in therapy.   PREOP: The patient has no high risk findings.  He has no high risk symptoms.  His  functional level is low but he limited mostly because of his hip.  He is not limited by cardiovascular issues.  He understands that his risk of cardiovascular events is probably about 4 to 5% based on his past cardiac history but recovery is in outcome is mostly determined by his age.  No further preoperative cardiovascular testing is suggested.  Current medicines are reviewed at length with the patient today.  The patient does not have concerns regarding medicines.  The following changes have been made: None  Labs/ tests ordered today include: None  Orders Placed This Encounter  Procedures   EKG 12-Lead     Disposition:   FU with me in 12 months.     Signed, Minus Breeding, MD  11/27/2021 1:17 PM    [facility]

## 2021-11-27 ENCOUNTER — Encounter: Payer: Self-pay | Admitting: Cardiology

## 2021-11-27 ENCOUNTER — Ambulatory Visit: Payer: Medicare Other | Attending: Cardiology | Admitting: Cardiology

## 2021-11-27 VITALS — BP 132/82 | HR 74 | Ht 72.0 in | Wt 216.4 lb

## 2021-11-27 DIAGNOSIS — I25118 Atherosclerotic heart disease of native coronary artery with other forms of angina pectoris: Secondary | ICD-10-CM | POA: Insufficient documentation

## 2021-11-27 DIAGNOSIS — I10 Essential (primary) hypertension: Secondary | ICD-10-CM | POA: Diagnosis not present

## 2021-11-27 DIAGNOSIS — E785 Hyperlipidemia, unspecified: Secondary | ICD-10-CM | POA: Diagnosis not present

## 2021-11-27 DIAGNOSIS — I82451 Acute embolism and thrombosis of right peroneal vein: Secondary | ICD-10-CM | POA: Diagnosis not present

## 2021-11-27 MED ORDER — METOPROLOL SUCCINATE ER 50 MG PO TB24: 50.0000 mg | ORAL_TABLET | Freq: Two times a day (BID) | ORAL | 3 refills | Status: DC

## 2021-11-27 MED ORDER — METOPROLOL SUCCINATE ER 50 MG PO TB24
50.0000 mg | ORAL_TABLET | Freq: Two times a day (BID) | ORAL | 3 refills | Status: DC
Start: 2021-11-27 — End: 2021-11-27

## 2021-11-27 MED ORDER — ATORVASTATIN CALCIUM 40 MG PO TABS: 40.0000 mg | ORAL_TABLET | Freq: Every day | ORAL | 3 refills | Status: DC

## 2021-11-27 NOTE — Patient Instructions (Signed)
Medication Instructions:  Your physician recommends that you continue on your current medications as directed. Please refer to the Current Medication list given to you today.  *If you need a refill on your cardiac medications before your next appointment, please call your pharmacy*   Lab Work: NONE ordered at this time of appointment  If you have labs (blood work) drawn today and your tests are completely normal, you will receive your results only by: [place] (if you have MyChart) OR A paper copy in the mail If you have any lab test that is abnormal or we need to change your treatment, we will call you to review the results.   Testing/Procedures: NONE ordered at this time of appointment    Follow-Up: At [facility], you and your health needs are our priority.  As part of our continuing mission to provide you with exceptional heart care, we have created designated Provider Care Teams.  These Care Teams include your primary Cardiologist (physician) and Advanced Practice Providers (APPs -  Physician Assistants and Nurse Practitioners) who all work together to provide you with the care you need, when you need it.  We recommend signing up for the patient portal called "MyChart".  Sign up information is provided on this After Visit Summary.  MyChart is used to connect with patients for Virtual Visits (Telemedicine).  Patients are able to view lab/test results, encounter notes, upcoming appointments, etc.  Non-urgent messages can be sent to your provider as well.   To learn more about what you can do with MyChart, go to [email or web address].    Your next appointment:   1 year(s)  The format for your next appointment:   In Person  Provider:   Minus Breeding, MD      Important Information About Sugar

## 2021-12-01 ENCOUNTER — Encounter: Payer: Self-pay | Admitting: Orthopaedic Surgery

## 2021-12-01 ENCOUNTER — Ambulatory Visit (INDEPENDENT_AMBULATORY_CARE_PROVIDER_SITE_OTHER): Payer: Medicare Other | Admitting: Orthopaedic Surgery

## 2021-12-01 DIAGNOSIS — I25118 Atherosclerotic heart disease of native coronary artery with other forms of angina pectoris: Secondary | ICD-10-CM | POA: Diagnosis not present

## 2021-12-01 DIAGNOSIS — M25552 Pain in left hip: Secondary | ICD-10-CM | POA: Diagnosis not present

## 2021-12-01 DIAGNOSIS — M1612 Unilateral primary osteoarthritis, left hip: Secondary | ICD-10-CM | POA: Diagnosis not present

## 2021-12-01 NOTE — Progress Notes (Signed)
The patient is referred from Dr. Durward Fortes due to severe arthritis of his left hip.  He is 88 years old.  He has a history of a right hip replacement performed many years ago by Dr. Durward Fortes and that is done very well.  He does have to ambulate with a rolling walker due to the severity of his left hip arthritis combined with scoliosis of the spine.  He is not on blood thinning medications.  He does have remote history of a DVT following shoulder surgery.  We would then likely have him on Eliquis for short-term after his hip replacement.  He does have severe well-documented in stage arthritis of his left hip.  This has been seen on clinical exam and x-ray findings and he is interested in hip replacement surgery.  His left hip pain is daily and it is 10 out of 10.  It is detrimentally affecting his mobility, his quality of life and his activities day living.  He has been cleared for surgery by his cardiologist.  He does have a remote history of a CABG.  His daughter is actually Dr. Margaretha Glassing who is at [facility] for many years.  She is here today as well.  I did go over his x-ray findings with him in detail.  His left hip has severe end-stage arthritis with basically no joint space remaining.  There is severe bone-on-bone wear and osteophytes around the left hip.  His right hip is a well-seated total hip arthroplasty.  Exam his right operative hip moves smoothly and fluidly.  The left hip has severe pain with any attempts of internal and external rotation and there is significant decrease in rotation due to the stiffness of his left hip.  We went over hip replacement surgery in detail through an anterior approach.  I showed him a hip replacement model and gave him a handout about hip replacement surgery.  We described the risks and benefits of the surgery and what to expect from an intraoperative and postoperative course.  He and his wife are already residence of [place].  He will transition to their rehab  facility after surgery.  All question concerns were answered and addressed.  We will work on getting this scheduled.

## 2021-12-02 DIAGNOSIS — M25552 Pain in left hip: Secondary | ICD-10-CM | POA: Diagnosis not present

## 2021-12-02 DIAGNOSIS — R262 Difficulty in walking, not elsewhere classified: Secondary | ICD-10-CM | POA: Diagnosis not present

## 2021-12-05 DIAGNOSIS — M25552 Pain in left hip: Secondary | ICD-10-CM | POA: Diagnosis not present

## 2021-12-05 DIAGNOSIS — R262 Difficulty in walking, not elsewhere classified: Secondary | ICD-10-CM | POA: Diagnosis not present

## 2021-12-08 DIAGNOSIS — M25552 Pain in left hip: Secondary | ICD-10-CM | POA: Diagnosis not present

## 2021-12-08 DIAGNOSIS — R262 Difficulty in walking, not elsewhere classified: Secondary | ICD-10-CM | POA: Diagnosis not present

## 2021-12-11 ENCOUNTER — Other Ambulatory Visit: Payer: Self-pay

## 2021-12-11 DIAGNOSIS — M25552 Pain in left hip: Secondary | ICD-10-CM | POA: Diagnosis not present

## 2021-12-11 DIAGNOSIS — R262 Difficulty in walking, not elsewhere classified: Secondary | ICD-10-CM | POA: Diagnosis not present

## 2021-12-11 NOTE — Patient Instructions (Addendum)
SURGICAL WAITING ROOM VISITATION Patients having surgery or a procedure may have no more than 2 support people in the waiting area - these visitors may rotate.   Children under the age of 78 must have an adult with them who is not the patient. If the patient needs to stay at the hospital during part of their recovery, the visitor guidelines for inpatient rooms apply. Pre-op nurse will coordinate an appropriate time for 1 support person to accompany patient in pre-op.  This support person may not rotate.    Please refer to the [facility] website for the visitor guidelines for Inpatients (after your surgery is over and you are in a regular room).      Your procedure is scheduled on: 12-25-21   Report to [facility] Main Entrance    Report to admitting at 7:30 AM   Call this number if you have problems the morning of surgery [phone]   Do not eat food :After Midnight.   After Midnight you may have the following liquids until 7:00 AM DAY OF SURGERY  Water Non-Citrus Juices (without pulp, NO RED) Carbonated Beverages Black Coffee (NO MILK/CREAM OR CREAMERS, sugar ok)  Clear Tea (NO MILK/CREAM OR CREAMERS, sugar ok) regular and decaf                             Plain Jell-O (NO RED)                                           Fruit ices (not with fruit pulp, NO RED)                                     Popsicles (NO RED)                                                               Sports drinks like Gatorade (NO RED)                   The day of surgery:  Drink ONE (1) Pre-Surgery Clear Ensure 7:00 AM the morning of surgery. Drink in one sitting. Do not sip.  This drink was given to you during your hospital  pre-op appointment visit. Nothing else to drink after completing the Pre-Surgery Clear Ensure.          If you have questions, please contact your surgeon's office.   FOLLOW  ANY ADDITIONAL PRE OP INSTRUCTIONS YOU RECEIVED FROM YOUR SURGEON'S OFFICE!!!     Oral  Hygiene is also important to reduce your risk of infection.                                    Remember - BRUSH YOUR TEETH THE MORNING OF SURGERY WITH YOUR REGULAR TOOTHPASTE   Do NOT smoke after Midnight   Take these medicines the morning of surgery with A SIP OF WATER: Gabapentin Metoprolol  You may not have any metal on your body including  jewelry, and body piercing             Do not wear lotions, powders, cologne, or deodorant              Men may shave face and neck.   Do not bring valuables to the hospital. Marc Gilbert.   Contacts, dentures or bridgework may not be worn into surgery.   Bring small overnight bag day of surgery.   DO NOT [place]. PHARMACY WILL DISPENSE MEDICATIONS LISTED ON YOUR MEDICATION LIST TO YOU DURING YOUR ADMISSION [place]!    Special Instructions: Bring a copy of your healthcare power of attorney and living will documents the day of surgery if you haven't scanned them before.              Please read over the following fact sheets you were given: IF Marc Gilbert  If you received a COVID test during your pre-op visit  it is requested that you wear a mask when out in public, stay away from anyone that may not be feeling well and notify your surgeon if you develop symptoms. If you test positive for Covid or have been in contact with anyone that has tested positive in the last 10 days please notify you surgeon.  [facility] - Preparing for Surgery Before surgery, you can play an important role.  Because skin is not sterile, your skin needs to be as free of germs as possible.  You can reduce the number of germs on your skin by washing with CHG (chlorahexidine gluconate) soap before surgery.  CHG is an antiseptic cleaner which kills germs and bonds with the skin to continue killing germs  even after washing. Please DO NOT use if you have an allergy to CHG or antibacterial soaps.  If your skin becomes reddened/irritated stop using the CHG and inform your nurse when you arrive at Short Stay. Do not shave (including legs and underarms) for at least 48 hours prior to the first CHG shower.  You may shave your face/neck.  Please follow these instructions carefully:  1.  Shower with CHG Soap the night before surgery and the  morning of surgery.  2.  If you choose to wash your hair, wash your hair first as usual with your normal  shampoo.  3.  After you shampoo, rinse your hair and body thoroughly to remove the shampoo.                             4.  Use CHG as you would any other liquid soap.  You can apply chg directly to the skin and wash.  Gently with a scrungie or clean washcloth.  5.  Apply the CHG Soap to your body ONLY FROM THE NECK DOWN.   Do   not use on face/ open                           Wound or open sores. Avoid contact with eyes, ears mouth and   genitals (private parts).                       Wash face,  Genitals (private parts) with your normal  soap.             6.  Wash thoroughly, paying special attention to the area where your    surgery  will be performed.  7.  Thoroughly rinse your body with warm water from the neck down.  8.  DO NOT shower/wash with your normal soap after using and rinsing off the CHG Soap.                9.  Pat yourself dry with a clean towel.            10.  Wear clean pajamas.            11.  Place clean sheets on your bed the night of your first shower and do not  sleep with pets. Day of Surgery : Do not apply any lotions/deodorants the morning of surgery.  Please wear clean clothes to the hospital/surgery center.  FAILURE TO FOLLOW THESE INSTRUCTIONS MAY RESULT IN THE CANCELLATION OF YOUR SURGERY  PATIENT SIGNATURE_________________________________  NURSE  SIGNATURE__________________________________  ________________________________________________________________________     Marc Gilbert  An incentive spirometer is a tool that can help keep your lungs clear and active. This tool measures how well you are filling your lungs with each breath. Taking long deep breaths may help reverse or decrease the chance of developing breathing (pulmonary) problems (especially infection) following: A long period of time when you are unable to move or be active. BEFORE THE PROCEDURE  If the spirometer includes an indicator to show your best effort, your nurse or respiratory therapist will set it to a desired goal. If possible, sit up straight or lean slightly forward. Try not to slouch. Hold the incentive spirometer in an upright position. INSTRUCTIONS FOR USE  Sit on the edge of your bed if possible, or sit up as far as you can in bed or on a chair. Hold the incentive spirometer in an upright position. Breathe out normally. Place the mouthpiece in your mouth and seal your lips tightly around it. Breathe in slowly and as deeply as possible, raising the piston or the ball toward the top of the column. Hold your breath for 3-5 seconds or for as long as possible. Allow the piston or ball to fall to the bottom of the column. Remove the mouthpiece from your mouth and breathe out normally. Rest for a few seconds and repeat Steps 1 through 7 at least 10 times every 1-2 hours when you are awake. Take your time and take a few normal breaths between deep breaths. The spirometer may include an indicator to show your best effort. Use the indicator as a goal to work toward during each repetition. After each set of 10 deep breaths, practice coughing to be sure your lungs are clear. If you have an incision (the cut made at the time of surgery), support your incision when coughing by placing a pillow or rolled up towels firmly against it. Once you are able to get out  of bed, walk around indoors and cough well. You may stop using the incentive spirometer when instructed by your caregiver.  RISKS AND COMPLICATIONS Take your time so you do not get dizzy or light-headed. If you are in pain, you may need to take or ask for pain medication before doing incentive spirometry. It is harder to take a deep breath if you are having pain. AFTER USE Rest and breathe slowly and easily. It can be helpful to keep track of a log of  your progress. Your caregiver can provide you with a simple table to help with this. If you are using the spirometer at home, follow these instructions: [place] IF:  You are having difficultly using the spirometer. You have trouble using the spirometer as often as instructed. Your pain medication is not giving enough relief while using the spirometer. You develop fever of 100.5 F (38.1 C) or higher. SEEK IMMEDIATE MEDICAL CARE IF:  You cough up bloody sputum that had not been present before. You develop fever of 102 F (38.9 C) or greater. You develop worsening pain at or near the incision site. MAKE SURE YOU:  Understand these instructions. Will watch your condition. Will get help right away if you are not doing well or get worse. Document Released: 06/15/2006 Document Revised: 04/27/2011 Document Reviewed: 08/16/2006 Exit[employer] Patient Information 2014 Exit[employer], [place].   ________________________________________________________________________  WHAT IS A BLOOD TRANSFUSION? Blood Transfusion Information  A transfusion is the replacement of blood or some of its parts. Blood is made up of multiple cells which provide different functions. Red blood cells carry oxygen and are used for blood loss replacement. White blood cells fight against infection. Platelets control bleeding. Plasma helps clot blood. Other blood products are available for specialized needs, such as hemophilia or other clotting disorders. BEFORE THE TRANSFUSION   Who gives blood for transfusions?  Healthy volunteers who are fully evaluated to make sure their blood is safe. This is blood bank blood. Transfusion therapy is the safest it has ever been in the practice of medicine. Before blood is taken from a donor, a complete history is taken to make sure that person has no history of diseases nor engages in risky social behavior (examples are intravenous drug use or sexual activity with multiple partners). The donor's travel history is screened to minimize risk of transmitting infections, such as malaria. The donated blood is tested for signs of infectious diseases, such as HIV and hepatitis. The blood is then tested to be sure it is compatible with you in order to minimize the chance of a transfusion reaction. If you or a relative donates blood, this is often done in anticipation of surgery and is not appropriate for emergency situations. It takes many days to process the donated blood. RISKS AND COMPLICATIONS Although transfusion therapy is very safe and saves many lives, the main dangers of transfusion include:  Getting an infectious disease. Developing a transfusion reaction. This is an allergic reaction to something in the blood you were given. Every precaution is taken to prevent this. The decision to have a blood transfusion has been considered carefully by your caregiver before blood is given. Blood is not given unless the benefits outweigh the risks. AFTER THE TRANSFUSION Right after receiving a blood transfusion, you will usually feel much better and more energetic. This is especially true if your red blood cells have gotten low (anemic). The transfusion raises the level of the red blood cells which carry oxygen, and this usually causes an energy increase. The nurse administering the transfusion will monitor you carefully for complications. HOME CARE INSTRUCTIONS  No special instructions are needed after a transfusion. You may find your energy is  better. Speak with your caregiver about any limitations on activity for underlying diseases you may have. SEEK MEDICAL CARE IF:  Your condition is not improving after your transfusion. You develop redness or irritation at the intravenous (IV) site. SEEK IMMEDIATE MEDICAL CARE IF:  Any of the following symptoms occur over the next  12 hours: Shaking chills. You have a temperature by mouth above 102 F (38.9 C), not controlled by medicine. Chest, back, or muscle pain. People around you feel you are not acting correctly or are confused. Shortness of breath or difficulty breathing. Dizziness and fainting. You get a rash or develop hives. You have a decrease in urine output. Your urine turns a dark color or changes to pink, red, or brown. Any of the following symptoms occur over the next 10 days: You have a temperature by mouth above 102 F (38.9 C), not controlled by medicine. Shortness of breath. Weakness after normal activity. The white part of the eye turns yellow (jaundice). You have a decrease in the amount of urine or are urinating less often. Your urine turns a dark color or changes to pink, red, or brown. Document Released: 01/31/2000 Document Revised: 04/27/2011 Document Reviewed: 09/19/2007 [facility] Patient Information 2014 [place], [place].  _______________________________________________________________________

## 2021-12-11 NOTE — Progress Notes (Addendum)
COVID Vaccine Completed:  Yes  Date of COVID positive in last 90 days:  Yes  Had Covid the last week of August   PCP - Lajean Manes, MD Cardiologist -Minus Breeding, MD  Cardiac clearance in note dated 11-27-21 by Dr. Percival Spanish  Chest x-ray - N/A EKG - 11-27-21 Epic Stress Test - 2017 Epic ECHO - 2018 Epic Cardiac Cath - 2018 Epic Pacemaker/ICD device last checked: Spinal Cord Stimulator: N/A  Bowel Prep -   N/A  Sleep Study - N/A CPAP -   Fasting Blood Sugar - N/A Checks Blood Sugar _____ times a day  Blood Thinner Instructions: Aspirin Instructions:  ASA 81.  Patient states he is to continue Last Dose:  Activity level:  Can go up a flight of stairs and perform activities of daily living without stopping and without symptoms of chest pain.  Patient states that he has mild shortness of breath at times with exertion due to COPD.  Limitations climbing stairs due to hip pain  Anesthesia review:  CAD, COPD, HTN, CKD.  Hx of CABG  Patient denies shortness of breath, fever, cough and chest pain at PAT appointment  Patient verbalized understanding of instructions that were given to them at the PAT appointment. Patient was also instructed that they will need to review over the PAT instructions again at home before surgery.

## 2021-12-12 NOTE — Progress Notes (Signed)
Sent message, via epic in basket, requesting orders in epic from surgeon.  

## 2021-12-15 ENCOUNTER — Other Ambulatory Visit: Payer: Self-pay | Admitting: Physician Assistant

## 2021-12-15 DIAGNOSIS — R262 Difficulty in walking, not elsewhere classified: Secondary | ICD-10-CM | POA: Diagnosis not present

## 2021-12-15 DIAGNOSIS — M25552 Pain in left hip: Secondary | ICD-10-CM | POA: Diagnosis not present

## 2021-12-16 ENCOUNTER — Encounter (HOSPITAL_COMMUNITY)
Admission: RE | Admit: 2021-12-16 | Discharge: 2021-12-16 | Disposition: A | Discharge status: Home / Self Care | Payer: Medicare Other | Source: Ambulatory Visit | Attending: Orthopaedic Surgery | Admitting: Orthopaedic Surgery

## 2021-12-16 ENCOUNTER — Encounter (HOSPITAL_COMMUNITY): Payer: Self-pay

## 2021-12-16 ENCOUNTER — Other Ambulatory Visit: Payer: Self-pay

## 2021-12-16 VITALS — BP 129/82 | HR 76 | Temp 98.9°F | Resp 20 | Ht 72.0 in | Wt 215.0 lb

## 2021-12-16 DIAGNOSIS — I2510 Atherosclerotic heart disease of native coronary artery without angina pectoris: Secondary | ICD-10-CM | POA: Diagnosis not present

## 2021-12-16 DIAGNOSIS — I129 Hypertensive chronic kidney disease with stage 1 through stage 4 chronic kidney disease, or unspecified chronic kidney disease: Secondary | ICD-10-CM | POA: Diagnosis not present

## 2021-12-16 DIAGNOSIS — Z87891 Personal history of nicotine dependence: Secondary | ICD-10-CM | POA: Insufficient documentation

## 2021-12-16 DIAGNOSIS — J449 Chronic obstructive pulmonary disease, unspecified: Secondary | ICD-10-CM | POA: Insufficient documentation

## 2021-12-16 DIAGNOSIS — M1612 Unilateral primary osteoarthritis, left hip: Secondary | ICD-10-CM | POA: Diagnosis not present

## 2021-12-16 DIAGNOSIS — N183 Chronic kidney disease, stage 3 unspecified: Secondary | ICD-10-CM | POA: Insufficient documentation

## 2021-12-16 DIAGNOSIS — Z01812 Encounter for preprocedural laboratory examination: Secondary | ICD-10-CM | POA: Insufficient documentation

## 2021-12-16 DIAGNOSIS — Z01818 Encounter for other preprocedural examination: Secondary | ICD-10-CM

## 2021-12-16 LAB — BASIC METABOLIC PANEL
Anion gap: 6 (ref 5–15)
BUN: 25 mg/dL — ABNORMAL HIGH (ref 8–23)
CO2: 25 mmol/L (ref 22–32)
Calcium: 9.2 mg/dL (ref 8.9–10.3)
Chloride: 105 mmol/L (ref 98–111)
Creatinine, Ser: 1.1 mg/dL (ref 0.61–1.24)
GFR, Estimated: >60 mL/min (ref >60)
Glucose, Bld: 122 mg/dL — ABNORMAL HIGH (ref 70–99)
Potassium: 4.8 mmol/L (ref 3.5–5.1)
Sodium: 136 mmol/L (ref 135–145)

## 2021-12-16 LAB — CBC
HCT: 45 % (ref 39.0–52.0)
Hemoglobin: 14.7 g/dL (ref 13.0–17.0)
MCH: 31.5 pg (ref 26.0–34.0)
MCHC: 32.7 g/dL (ref 30.0–36.0)
MCV: 96.6 fL (ref 80.0–100.0)
Platelets: 183 10*3/uL (ref 150–400)
RBC: 4.66 MIL/uL (ref 4.22–5.81)
RDW: 12.5 % (ref 11.5–15.5)
WBC: 7 10*3/uL (ref 4.0–10.5)
nRBC: 0 % (ref 0.0–0.2)

## 2021-12-16 LAB — SURGICAL PCR SCREEN
MRSA, PCR: NEGATIVE
Staphylococcus aureus: POSITIVE — AB

## 2021-12-16 NOTE — Progress Notes (Signed)
PCR results sent to Dr. Ninfa Linden to review.

## 2021-12-17 DIAGNOSIS — M25552 Pain in left hip: Secondary | ICD-10-CM | POA: Diagnosis not present

## 2021-12-17 DIAGNOSIS — R262 Difficulty in walking, not elsewhere classified: Secondary | ICD-10-CM | POA: Diagnosis not present

## 2021-12-17 NOTE — Anesthesia Preprocedure Evaluation (Addendum)
Anesthesia Evaluation  Patient identified by MRN, date of birth, ID band Patient awake    Reviewed: Allergy & Precautions, NPO status , Patient's Chart, lab work & pertinent test results, reviewed documented beta blocker date and time   Airway Mallampati: II  TM Distance: >3 FB Neck ROM: Full    Dental no notable dental hx. (+) Dental Advisory Given, Caps   Pulmonary COPD,  COPD inhaler, former smoker   Pulmonary exam normal breath sounds clear to auscultation       Cardiovascular hypertension, Pt. on medications + angina with exertion + CAD and + CABG  Normal cardiovascular exam Rhythm:Regular Rate:Normal  Echo 08/11/16 Left ventricle: The cavity size was normal. Wall thickness was    normal. Systolic function was normal. The estimated ejection    fraction was in the range of 55% to 60%. Incoordinate septal    motion. Doppler parameters are consistent with abnormal left    ventricular relaxation (grade 1 diastolic dysfunction). The E/e&'   ratio is <8, suggesting normal LV filling pressure.  - Aortic valve: Sclerosis without stenosis. There was no    regurgitation.  - Aorta: Mildly dilated aortic root. Aortic root dimension: 39 mm    (ED).  - Mitral valve: Mildly thickened leaflets . There was trivial    regurgitation.  - Left atrium: The atrium was normal in size.  - Right ventricle: The cavity size was mildly dilated. Moderate    systolic dysfunction. Lateral annulus peak S velocity: 5.33 cm/s.  - Pulmonary arteries: PA peak pressure: 32 mm Hg (S).  - Inferior vena cava: The vessel was normal in size. The    respirophasic diameter changes were in the normal range (>= 50%),    consistent with normal central venous pressure.   EKG 11/27/21 NSR, non specific IVCD, inverted T waves V1-V4 possible anterior ischemia    Neuro/Psych Bilateral hearing loss  negative psych ROS   GI/Hepatic Neg liver ROS, hiatal hernia,GERD   Medicated,,  Endo/Other  Hyperlipidemia  Renal/GU Renal disease  negative genitourinary   Musculoskeletal  (+) Arthritis , Osteoarthritis,  OA left hip   Abdominal Normal abdominal exam  (+)   Peds  Hematology negative hematology ROS (+)   Anesthesia Other Findings   Reproductive/Obstetrics                             Anesthesia Physical Anesthesia Plan  ASA: 3  Anesthesia Plan: Spinal   Post-op Pain Management: Regional block* and Minimal or no pain anticipated   Induction: Intravenous  PONV Risk Score and Plan: 3 and Treatment may vary due to age or medical condition, Ondansetron and Propofol infusion  Airway Management Planned: Natural Airway and Simple Face Mask  Additional Equipment: None  Intra-op Plan:   Post-operative Plan:   Informed Consent: I have reviewed the patients History and Physical, chart, labs and discussed the procedure including the risks, benefits and alternatives for the proposed anesthesia with the patient or authorized representative who has indicated his/her understanding and acceptance.     Dental advisory given  Plan Discussed with: Anesthesiologist and CRNA  Anesthesia Plan Comments: (See PAT note 12/16/2021)        Anesthesia Quick Evaluation

## 2021-12-17 NOTE — Progress Notes (Signed)
Anesthesia Chart Review   Case: [id number] Date/Time: 12/25/21 2992   Procedure: LEFT TOTAL HIP ARTHROPLASTY ANTERIOR APPROACH (Left: Hip)   Anesthesia type: Spinal   Pre-op diagnosis: osteoarthritis left hip   Location: [facility] 09 / [facility]   Surgeons: [name], MD       DISCUSSION:88 y.o. former smoker with h/o COPD, HTN, CAD (CABG 08/14/2016), CKD Stage III, left hip OA scheduled for above procedure 12/25/2021 with Dr. Jean Rosenthal.   Pt last seen by cardiology 11/27/2021. Per OV note, "PREOP: The patient has no high risk findings.  He has no high risk symptoms.  His functional level is low but he limited mostly because of his hip.  He is not limited by cardiovascular issues.  He understands that his risk of cardiovascular events is probably about 4 to 5% based on his past cardiac history but recovery is in outcome is mostly determined by his age.  No further preoperative cardiovascular testing is suggested."  Anticipate pt can proceed with planned procedure barring acute status change.   VS: BP 129/82   Pulse 76   Temp 37.2 C (Oral)   Resp 20   Ht 6' (1.829 m)   Wt 97.5 kg   SpO2 94%   BMI 29.16 kg/m   PROVIDERS: Lajean Manes, MD is PCP   Cardiologist -Minus Breeding, MD LABS: Labs reviewed: Acceptable for surgery. (all labs ordered are listed, but only abnormal results are displayed)  Labs Reviewed  SURGICAL PCR SCREEN - Abnormal; Notable for the following components:      Result Value   Staphylococcus aureus POSITIVE (*)    All other components within normal limits  BASIC METABOLIC PANEL - Abnormal; Notable for the following components:   Glucose, Bld 122 (*)    BUN 25 (*)    All other components within normal limits  CBC  TYPE AND SCREEN     IMAGES:   EKG:   CV: Echo 08/14/2016  Septum: No Patent Foramen Ovale present.   Left atrium: Patent foramen ovale not present.   Aortic valve: No stenosis. Trace regurgitation.   Mitral  valve: Mild regurgitation.   Left ventricle: LV systolic function is mildly reduced with an EF of  45-50%.  Past Medical History:  Diagnosis Date   Arthritis    "right hand; back" (08/30/2015)   Cancer (HCC)    skin  squamous and basal   Chronic lower back pain    CKD (chronic kidney disease), stage III (HCC)    Stage III   COPD (chronic obstructive pulmonary disease) (HCC)    no home O2   Coronary artery disease    Dysplastic colon polyp age 59   carcinoma in situ   GERD (gastroesophageal reflux disease)    occ   H/O hiatal hernia    Hyperlipidemia    Hypertension    Prostate cancer ([place]) 07/2014   active surveillance, Glisson 6    Past Surgical History:  Procedure Laterality Date   CARDIAC CATHETERIZATION     CATARACT EXTRACTION W/ INTRAOCULAR LENS IMPLANT Left 08/27/2015   CORONARY ARTERY BYPASS GRAFT N/A 08/14/2016   Procedure: CORONARY ARTERY BYPASS GRAFTING (CABG)x2, using left internal mammary artery and right greater saphenous vein harvested endoscopically;  Surgeon: Gaye Pollack, MD;  Location: [place];  Service: Open Heart Surgery;  Laterality: N/A;   JOINT REPLACEMENT     LEFT HEART CATH AND CORONARY ANGIOGRAPHY N/A 08/11/2016   Procedure: Left Heart Cath and Coronary Angiography;  Surgeon: Belva Crome, MD;  Location: [place] [facility];  Service: Cardiovascular;  Laterality: N/A;   MOHS SURGERY     multiple SCC   PROSTATE BIOPSY  07/18/2014   TEE WITHOUT CARDIOVERSION N/A 08/14/2016   Procedure: TRANSESOPHAGEAL ECHOCARDIOGRAM (TEE);  Surgeon: Gaye Pollack, MD;  Location: [place];  Service: Open Heart Surgery;  Laterality: N/A;   TONSILLECTOMY     TOTAL HIP ARTHROPLASTY  05/26/2011   Procedure: TOTAL HIP ARTHROPLASTY;  Surgeon: Garald Balding, MD;  Location: [place];  Service: Orthopedics;  Laterality: Right;   TOTAL SHOULDER ARTHROPLASTY Right 06/18/2020   Procedure: TOTAL SHOULDER ARTHROPLASTY;  Surgeon: Marchia Bond, MD;  [facility];  Service:  Orthopedics;  Laterality: Right;   ULTRASOUND GUIDANCE FOR VASCULAR ACCESS  08/11/2016   Procedure: Ultrasound Guidance For Vascular Access;  Surgeon: Belva Crome, MD;  Location: [place] [facility];  Service: Cardiovascular;;    MEDICATIONS:  amoxicillin (AMOXIL) 500 MG tablet   aspirin 81 MG tablet   atorvastatin (LIPITOR) 40 MG tablet   diclofenac Sodium (VOLTAREN) 1 % GEL   docusate sodium (COLACE) 250 MG capsule   gabapentin (NEURONTIN) 300 MG capsule   Melatonin 10 MG TABS   metoprolol succinate (TOPROL-XL) 50 MG 24 hr tablet   Multiple Vitamins-Minerals (ONE-A-DAY MENS 50+ ADVANTAGE) TABS   Omega-3 Fatty Acids (FISH OIL OMEGA-3 PO)   oxybutynin (DITROPAN) 5 MG tablet   polyethylene glycol (MIRALAX / GLYCOLAX) 17 g packet   sodium chloride (OCEAN) 0.65 % SOLN nasal spray   Tamsulosin HCl (FLOMAX) 0.4 MG CAPS   No current facility-administered medications for this encounter.    Konrad Felix Ward, PA-C [facility] [phone]

## 2021-12-19 ENCOUNTER — Encounter: Payer: Self-pay | Admitting: Orthopaedic Surgery

## 2021-12-19 DIAGNOSIS — E7800 Pure hypercholesterolemia, unspecified: Secondary | ICD-10-CM | POA: Diagnosis not present

## 2021-12-19 DIAGNOSIS — Z0000 Encounter for general adult medical examination without abnormal findings: Secondary | ICD-10-CM | POA: Diagnosis not present

## 2021-12-19 DIAGNOSIS — Z1331 Encounter for screening for depression: Secondary | ICD-10-CM | POA: Diagnosis not present

## 2021-12-19 DIAGNOSIS — M1612 Unilateral primary osteoarthritis, left hip: Secondary | ICD-10-CM | POA: Diagnosis not present

## 2021-12-19 DIAGNOSIS — I129 Hypertensive chronic kidney disease with stage 1 through stage 4 chronic kidney disease, or unspecified chronic kidney disease: Secondary | ICD-10-CM | POA: Diagnosis not present

## 2021-12-19 DIAGNOSIS — C61 Malignant neoplasm of prostate: Secondary | ICD-10-CM | POA: Diagnosis not present

## 2021-12-19 DIAGNOSIS — J449 Chronic obstructive pulmonary disease, unspecified: Secondary | ICD-10-CM | POA: Diagnosis not present

## 2021-12-22 DIAGNOSIS — B351 Tinea unguium: Secondary | ICD-10-CM | POA: Diagnosis not present

## 2021-12-22 DIAGNOSIS — M79675 Pain in left toe(s): Secondary | ICD-10-CM | POA: Diagnosis not present

## 2021-12-22 DIAGNOSIS — M79674 Pain in right toe(s): Secondary | ICD-10-CM | POA: Diagnosis not present

## 2021-12-22 DIAGNOSIS — M25552 Pain in left hip: Secondary | ICD-10-CM | POA: Diagnosis not present

## 2021-12-22 DIAGNOSIS — R262 Difficulty in walking, not elsewhere classified: Secondary | ICD-10-CM | POA: Diagnosis not present

## 2021-12-24 ENCOUNTER — Telehealth: Payer: Self-pay | Admitting: *Deleted

## 2021-12-24 NOTE — Telephone Encounter (Signed)
Ortho bundle pre-op call completed. 

## 2021-12-24 NOTE — Care Plan (Signed)
[facility] RNCM call to patient to discuss his upcoming Left total hip arthroplasty with Dr. Ninfa Linden on Thursday, 12/24/21. He is an Ortho bundle through [facility] and is agreeable to case management. He lives with a spouse at [facility] and has pre-arranged transitional rehab there once he discharges. He has a RW he states as well as a cane.He anticipates staying at [facility] for around a week he hopes. Reviewed post op care instructions. Will continue to follow for needs.

## 2021-12-25 ENCOUNTER — Ambulatory Visit (HOSPITAL_COMMUNITY): Payer: Medicare Other

## 2021-12-25 ENCOUNTER — Encounter (HOSPITAL_COMMUNITY)
Admission: RE | Disposition: A | Discharge status: Skilled Nursing Facility | Payer: Self-pay | Source: Ambulatory Visit | Attending: Orthopaedic Surgery

## 2021-12-25 ENCOUNTER — Ambulatory Visit (HOSPITAL_BASED_OUTPATIENT_CLINIC_OR_DEPARTMENT_OTHER): Payer: Medicare Other | Admitting: Anesthesiology

## 2021-12-25 ENCOUNTER — Encounter (HOSPITAL_COMMUNITY): Payer: Self-pay | Admitting: Orthopaedic Surgery

## 2021-12-25 ENCOUNTER — Ambulatory Visit (HOSPITAL_COMMUNITY): Payer: Medicare Other | Admitting: Physician Assistant

## 2021-12-25 ENCOUNTER — Observation Stay (HOSPITAL_COMMUNITY)
Admission: RE | Admit: 2021-12-25 | Discharge: 2021-12-27 | Disposition: A | Discharge status: Skilled Nursing Facility | Payer: Medicare Other | Source: Ambulatory Visit | Attending: Orthopaedic Surgery | Admitting: Orthopaedic Surgery

## 2021-12-25 ENCOUNTER — Observation Stay (HOSPITAL_COMMUNITY): Payer: Medicare Other

## 2021-12-25 ENCOUNTER — Other Ambulatory Visit: Payer: Self-pay

## 2021-12-25 DIAGNOSIS — Z471 Aftercare following joint replacement surgery: Secondary | ICD-10-CM | POA: Diagnosis not present

## 2021-12-25 DIAGNOSIS — Z8546 Personal history of malignant neoplasm of prostate: Secondary | ICD-10-CM | POA: Insufficient documentation

## 2021-12-25 DIAGNOSIS — I25118 Atherosclerotic heart disease of native coronary artery with other forms of angina pectoris: Secondary | ICD-10-CM | POA: Diagnosis not present

## 2021-12-25 DIAGNOSIS — Z96641 Presence of right artificial hip joint: Secondary | ICD-10-CM | POA: Diagnosis not present

## 2021-12-25 DIAGNOSIS — Z86718 Personal history of other venous thrombosis and embolism: Secondary | ICD-10-CM | POA: Diagnosis not present

## 2021-12-25 DIAGNOSIS — Z96642 Presence of left artificial hip joint: Secondary | ICD-10-CM

## 2021-12-25 DIAGNOSIS — I10 Essential (primary) hypertension: Secondary | ICD-10-CM | POA: Diagnosis not present

## 2021-12-25 DIAGNOSIS — Z87891 Personal history of nicotine dependence: Secondary | ICD-10-CM | POA: Diagnosis not present

## 2021-12-25 DIAGNOSIS — Z85828 Personal history of other malignant neoplasm of skin: Secondary | ICD-10-CM | POA: Diagnosis not present

## 2021-12-25 DIAGNOSIS — J449 Chronic obstructive pulmonary disease, unspecified: Secondary | ICD-10-CM | POA: Diagnosis not present

## 2021-12-25 DIAGNOSIS — Z96611 Presence of right artificial shoulder joint: Secondary | ICD-10-CM | POA: Insufficient documentation

## 2021-12-25 DIAGNOSIS — Z951 Presence of aortocoronary bypass graft: Secondary | ICD-10-CM | POA: Diagnosis not present

## 2021-12-25 DIAGNOSIS — I129 Hypertensive chronic kidney disease with stage 1 through stage 4 chronic kidney disease, or unspecified chronic kidney disease: Secondary | ICD-10-CM | POA: Diagnosis not present

## 2021-12-25 DIAGNOSIS — I25119 Atherosclerotic heart disease of native coronary artery with unspecified angina pectoris: Secondary | ICD-10-CM | POA: Diagnosis not present

## 2021-12-25 DIAGNOSIS — M1612 Unilateral primary osteoarthritis, left hip: Secondary | ICD-10-CM

## 2021-12-25 DIAGNOSIS — N183 Chronic kidney disease, stage 3 unspecified: Secondary | ICD-10-CM | POA: Diagnosis not present

## 2021-12-25 DIAGNOSIS — E785 Hyperlipidemia, unspecified: Secondary | ICD-10-CM | POA: Diagnosis not present

## 2021-12-25 HISTORY — PX: TOTAL HIP ARTHROPLASTY: SHX124

## 2021-12-25 LAB — TYPE AND SCREEN
ABO/RH(D): A POS
Antibody Screen: NEGATIVE

## 2021-12-25 SURGERY — ARTHROPLASTY, HIP, TOTAL, ANTERIOR APPROACH
Anesthesia: General | Site: Hip | Laterality: Left

## 2021-12-25 MED ORDER — FENTANYL CITRATE (PF) 100 MCG/2ML IJ SOLN
INTRAMUSCULAR | Status: AC
  Filled 2021-12-25: qty 2

## 2021-12-25 MED ORDER — 0.9 % SODIUM CHLORIDE (POUR BTL) OPTIME
TOPICAL | Status: DC | PRN
  Administered 2021-12-25: 1000 mL

## 2021-12-25 MED ORDER — MENTHOL 3 MG MT LOZG: 1.0000 | LOZENGE | OROMUCOSAL | Status: DC | PRN

## 2021-12-25 MED ORDER — TAMSULOSIN HCL 0.4 MG PO CAPS
0.4000 mg | ORAL_CAPSULE | Freq: Every day | ORAL | Status: DC
  Administered 2021-12-25 – 2021-12-26 (×2): 0.4 mg via ORAL
  Filled 2021-12-25 (×2): qty 1

## 2021-12-25 MED ORDER — GABAPENTIN 300 MG PO CAPS
300.0000 mg | ORAL_CAPSULE | Freq: Three times a day (TID) | ORAL | Status: DC
  Administered 2021-12-25 – 2021-12-27 (×5): 300 mg via ORAL
  Filled 2021-12-25 (×5): qty 1

## 2021-12-25 MED ORDER — METHOCARBAMOL 500 MG IVPB - SIMPLE MED: 500.0000 mg | Freq: Four times a day (QID) | INTRAVENOUS | Status: DC | PRN

## 2021-12-25 MED ORDER — SALINE SPRAY 0.65 % NA SOLN
1.0000 | Freq: Four times a day (QID) | NASAL | Status: DC
  Administered 2021-12-25 – 2021-12-27 (×3): 1 via NASAL
  Filled 2021-12-25: qty 44

## 2021-12-25 MED ORDER — PHENYLEPHRINE HCL-NACL 20-0.9 MG/250ML-% IV SOLN
INTRAVENOUS | Status: DC | PRN
  Administered 2021-12-25: 50 ug/min via INTRAVENOUS

## 2021-12-25 MED ORDER — PROPOFOL 1000 MG/100ML IV EMUL
INTRAVENOUS | Status: AC
  Filled 2021-12-25: qty 100

## 2021-12-25 MED ORDER — CEFAZOLIN SODIUM-DEXTROSE 2-4 GM/100ML-% IV SOLN
2.0000 g | INTRAVENOUS | Status: AC
  Administered 2021-12-25: 2 g via INTRAVENOUS
  Filled 2021-12-25: qty 100

## 2021-12-25 MED ORDER — LIDOCAINE HCL (PF) 2 % IJ SOLN
INTRAMUSCULAR | Status: AC
  Filled 2021-12-25: qty 5

## 2021-12-25 MED ORDER — DOCUSATE SODIUM 100 MG PO CAPS
100.0000 mg | ORAL_CAPSULE | Freq: Two times a day (BID) | ORAL | Status: DC
  Administered 2021-12-25 – 2021-12-27 (×5): 100 mg via ORAL
  Filled 2021-12-25 (×5): qty 1

## 2021-12-25 MED ORDER — OXYBUTYNIN CHLORIDE 5 MG PO TABS
5.0000 mg | ORAL_TABLET | Freq: Every day | ORAL | Status: DC
  Administered 2021-12-25 – 2021-12-26 (×2): 5 mg via ORAL
  Filled 2021-12-25 (×2): qty 1

## 2021-12-25 MED ORDER — FENTANYL CITRATE (PF) 100 MCG/2ML IJ SOLN
INTRAMUSCULAR | Status: DC | PRN
  Administered 2021-12-25: 50 ug via INTRAVENOUS

## 2021-12-25 MED ORDER — OXYCODONE HCL 5 MG PO TABS: 5.0000 mg | ORAL_TABLET | ORAL | Status: DC | PRN

## 2021-12-25 MED ORDER — MELATONIN 5 MG PO TABS
10.0000 mg | ORAL_TABLET | Freq: Every day | ORAL | Status: DC
  Administered 2021-12-25 – 2021-12-26 (×2): 10 mg via ORAL
  Filled 2021-12-25 (×2): qty 2

## 2021-12-25 MED ORDER — HYDROMORPHONE HCL 1 MG/ML IJ SOLN: 0.5000 mg | INTRAMUSCULAR | Status: DC | PRN

## 2021-12-25 MED ORDER — EPHEDRINE 5 MG/ML INJ
INTRAVENOUS | Status: AC
  Filled 2021-12-25: qty 5

## 2021-12-25 MED ORDER — OXYCODONE HCL 5 MG PO TABS: 10.0000 mg | ORAL_TABLET | ORAL | Status: DC | PRN

## 2021-12-25 MED ORDER — CEFAZOLIN SODIUM-DEXTROSE 1-4 GM/50ML-% IV SOLN
1.0000 g | Freq: Four times a day (QID) | INTRAVENOUS | Status: AC
  Administered 2021-12-25 (×2): 1 g via INTRAVENOUS
  Filled 2021-12-25 (×2): qty 50

## 2021-12-25 MED ORDER — PHENOL 1.4 % MT LIQD: 1.0000 | OROMUCOSAL | Status: DC | PRN

## 2021-12-25 MED ORDER — ACETAMINOPHEN 500 MG PO TABS
1000.0000 mg | ORAL_TABLET | Freq: Four times a day (QID) | ORAL | Status: DC | PRN
  Administered 2021-12-26 (×2): 1000 mg via ORAL
  Filled 2021-12-25 (×2): qty 2

## 2021-12-25 MED ORDER — PANTOPRAZOLE SODIUM 40 MG PO TBEC
40.0000 mg | DELAYED_RELEASE_TABLET | Freq: Every day | ORAL | Status: DC
  Administered 2021-12-25 – 2021-12-27 (×3): 40 mg via ORAL
  Filled 2021-12-25 (×3): qty 1

## 2021-12-25 MED ORDER — CHLORHEXIDINE GLUCONATE 0.12 % MT SOLN
15.0000 mL | Freq: Once | OROMUCOSAL | Status: AC
  Administered 2021-12-25: 15 mL via OROMUCOSAL

## 2021-12-25 MED ORDER — ROCURONIUM BROMIDE 10 MG/ML (PF) SYRINGE
PREFILLED_SYRINGE | INTRAVENOUS | Status: AC
  Filled 2021-12-25: qty 10

## 2021-12-25 MED ORDER — ACETAMINOPHEN 325 MG PO TABS: 325.0000 mg | ORAL_TABLET | Freq: Four times a day (QID) | ORAL | Status: DC | PRN

## 2021-12-25 MED ORDER — METOCLOPRAMIDE HCL 5 MG/ML IJ SOLN: 5.0000 mg | Freq: Three times a day (TID) | INTRAMUSCULAR | Status: DC | PRN

## 2021-12-25 MED ORDER — DEXAMETHASONE SODIUM PHOSPHATE 10 MG/ML IJ SOLN
INTRAMUSCULAR | Status: AC
  Filled 2021-12-25: qty 1

## 2021-12-25 MED ORDER — POVIDONE-IODINE 10 % EX SWAB
2.0000 | Freq: Once | CUTANEOUS | Status: AC
  Administered 2021-12-25: 2 via TOPICAL

## 2021-12-25 MED ORDER — ONDANSETRON HCL 4 MG/2ML IJ SOLN: 4.0000 mg | Freq: Four times a day (QID) | INTRAMUSCULAR | Status: DC | PRN

## 2021-12-25 MED ORDER — PROPOFOL 10 MG/ML IV BOLUS
INTRAVENOUS | Status: DC | PRN
  Administered 2021-12-25: 20 mg via INTRAVENOUS
  Administered 2021-12-25 (×2): 10 mg via INTRAVENOUS

## 2021-12-25 MED ORDER — SODIUM CHLORIDE 0.9 % IR SOLN
Status: DC | PRN
  Administered 2021-12-25: 1000 mL

## 2021-12-25 MED ORDER — TRANEXAMIC ACID-NACL 1000-0.7 MG/100ML-% IV SOLN
1000.0000 mg | INTRAVENOUS | Status: AC
  Administered 2021-12-25: 1000 mg via INTRAVENOUS
  Filled 2021-12-25: qty 100

## 2021-12-25 MED ORDER — ORAL CARE MOUTH RINSE: 15.0000 mL | Freq: Once | OROMUCOSAL | Status: AC

## 2021-12-25 MED ORDER — ONDANSETRON HCL 4 MG/2ML IJ SOLN
INTRAMUSCULAR | Status: DC | PRN
  Administered 2021-12-25: 4 mg via INTRAVENOUS

## 2021-12-25 MED ORDER — METOPROLOL SUCCINATE ER 50 MG PO TB24
50.0000 mg | ORAL_TABLET | Freq: Two times a day (BID) | ORAL | Status: DC
  Administered 2021-12-25 – 2021-12-27 (×4): 50 mg via ORAL
  Filled 2021-12-25 (×4): qty 1

## 2021-12-25 MED ORDER — BUPIVACAINE IN DEXTROSE 0.75-8.25 % IT SOLN
INTRATHECAL | Status: DC | PRN
  Administered 2021-12-25: 2 mL via INTRATHECAL

## 2021-12-25 MED ORDER — PROPOFOL 10 MG/ML IV BOLUS
INTRAVENOUS | Status: AC
  Filled 2021-12-25: qty 20

## 2021-12-25 MED ORDER — DEXAMETHASONE SODIUM PHOSPHATE 10 MG/ML IJ SOLN
INTRAMUSCULAR | Status: DC | PRN
  Administered 2021-12-25: 10 mg via INTRAVENOUS

## 2021-12-25 MED ORDER — EPHEDRINE SULFATE-NACL 50-0.9 MG/10ML-% IV SOSY
PREFILLED_SYRINGE | INTRAVENOUS | Status: DC | PRN
  Administered 2021-12-25: 5 mg via INTRAVENOUS

## 2021-12-25 MED ORDER — SODIUM CHLORIDE 0.9 % IV SOLN: INTRAVENOUS | Status: DC

## 2021-12-25 MED ORDER — POLYETHYLENE GLYCOL 3350 17 G PO PACK
17.0000 g | PACK | Freq: Every day | ORAL | Status: DC | PRN
  Administered 2021-12-26: 17 g via ORAL
  Filled 2021-12-25: qty 1

## 2021-12-25 MED ORDER — APIXABAN 2.5 MG PO TABS
2.5000 mg | ORAL_TABLET | Freq: Two times a day (BID) | ORAL | Status: DC
  Administered 2021-12-26 – 2021-12-27 (×3): 2.5 mg via ORAL
  Filled 2021-12-25 (×3): qty 1

## 2021-12-25 MED ORDER — ATORVASTATIN CALCIUM 40 MG PO TABS
40.0000 mg | ORAL_TABLET | Freq: Every day | ORAL | Status: DC
  Administered 2021-12-25 – 2021-12-26 (×2): 40 mg via ORAL
  Filled 2021-12-25 (×2): qty 1

## 2021-12-25 MED ORDER — LACTATED RINGERS IV SOLN: INTRAVENOUS | Status: DC

## 2021-12-25 MED ORDER — ONDANSETRON HCL 4 MG PO TABS: 4.0000 mg | ORAL_TABLET | Freq: Four times a day (QID) | ORAL | Status: DC | PRN

## 2021-12-25 MED ORDER — ALUM & MAG HYDROXIDE-SIMETH 200-200-20 MG/5ML PO SUSP: 30.0000 mL | ORAL | Status: DC | PRN

## 2021-12-25 MED ORDER — METOCLOPRAMIDE HCL 5 MG PO TABS: 5.0000 mg | ORAL_TABLET | Freq: Three times a day (TID) | ORAL | Status: DC | PRN

## 2021-12-25 MED ORDER — TRAMADOL HCL 50 MG PO TABS
100.0000 mg | ORAL_TABLET | Freq: Four times a day (QID) | ORAL | Status: DC | PRN
  Administered 2021-12-25 – 2021-12-27 (×5): 100 mg via ORAL
  Filled 2021-12-25 (×6): qty 2

## 2021-12-25 MED ORDER — DIPHENHYDRAMINE HCL 12.5 MG/5ML PO ELIX: 12.5000 mg | ORAL_SOLUTION | ORAL | Status: DC | PRN

## 2021-12-25 MED ORDER — PROPOFOL 500 MG/50ML IV EMUL
INTRAVENOUS | Status: DC | PRN
  Administered 2021-12-25: 60 ug/kg/min via INTRAVENOUS

## 2021-12-25 MED ORDER — METHOCARBAMOL 500 MG PO TABS
500.0000 mg | ORAL_TABLET | Freq: Four times a day (QID) | ORAL | Status: DC | PRN
  Administered 2021-12-26 (×2): 500 mg via ORAL
  Filled 2021-12-25 (×2): qty 1

## 2021-12-25 MED ORDER — ONDANSETRON HCL 4 MG/2ML IJ SOLN
INTRAMUSCULAR | Status: AC
  Filled 2021-12-25: qty 2

## 2021-12-25 SURGICAL SUPPLY — 39 items
ARTICULEZE HEAD (Hips) ×1 IMPLANT
BAG COUNTER SPONGE SURGICOUNT (BAG) ×1 IMPLANT
BAG ZIPLOCK 12X15 (MISCELLANEOUS) IMPLANT
BENZOIN TINCTURE PRP APPL 2/3 (GAUZE/BANDAGES/DRESSINGS) IMPLANT
BLADE SAW SGTL 18X1.27X75 (BLADE) ×1 IMPLANT
COVER PERINEAL POST (MISCELLANEOUS) ×1 IMPLANT
COVER SURGICAL LIGHT HANDLE (MISCELLANEOUS) ×1 IMPLANT
CUP SECTOR GRIPTON 58MM (Orthopedic Implant) IMPLANT
DRAPE FOOT SWITCH (DRAPES) ×1 IMPLANT
DRAPE STERI IOBAN 125X83 (DRAPES) ×1 IMPLANT
DRAPE U-SHAPE 47X51 STRL (DRAPES) ×2 IMPLANT
DRSG AQUACEL AG ADV 3.5X10 (GAUZE/BANDAGES/DRESSINGS) ×1 IMPLANT
DURAPREP 26ML APPLICATOR (WOUND CARE) ×1 IMPLANT
ELECT REM PT RETURN 15FT ADLT (MISCELLANEOUS) ×1 IMPLANT
GAUZE XEROFORM 1X8 LF (GAUZE/BANDAGES/DRESSINGS) ×1 IMPLANT
GLOVE BIO SURGEON STRL SZ7.5 (GLOVE) ×1 IMPLANT
GLOVE BIOGEL PI IND STRL 8 (GLOVE) ×2 IMPLANT
GLOVE ECLIPSE 8.0 STRL XLNG CF (GLOVE) ×1 IMPLANT
GOWN STRL REUS W/ TWL XL LVL3 (GOWN DISPOSABLE) ×2 IMPLANT
GOWN STRL REUS W/TWL XL LVL3 (GOWN DISPOSABLE) ×2
HANDPIECE INTERPULSE COAX TIP (DISPOSABLE) ×1
HEAD ARTICULEZE (Hips) IMPLANT
HOLDER FOLEY CATH W/STRAP (MISCELLANEOUS) ×1 IMPLANT
KIT TURNOVER KIT A (KITS) IMPLANT
LINER NEUTRAL 36X58 PLUS4 IMPLANT
PACK ANTERIOR HIP CUSTOM (KITS) ×1 IMPLANT
SET HNDPC FAN SPRY TIP SCT (DISPOSABLE) ×1 IMPLANT
STAPLER VISISTAT 35W (STAPLE) IMPLANT
STEM FEMORAL SZ6 HIGH ACTIS (Stem) IMPLANT
STRIP CLOSURE SKIN 1/2X4 (GAUZE/BANDAGES/DRESSINGS) IMPLANT
SUT ETHIBOND NAB CT1 #1 30IN (SUTURE) ×1 IMPLANT
SUT ETHILON 2 0 PS N (SUTURE) IMPLANT
SUT MNCRL AB 4-0 PS2 18 (SUTURE) IMPLANT
SUT VIC AB 0 CT1 36 (SUTURE) ×1 IMPLANT
SUT VIC AB 1 CT1 36 (SUTURE) ×1 IMPLANT
SUT VIC AB 2-0 CT1 27 (SUTURE) ×2
SUT VIC AB 2-0 CT1 TAPERPNT 27 (SUTURE) ×2 IMPLANT
TRAY FOLEY MTR SLVR 16FR STAT (SET/KITS/TRAYS/PACK) IMPLANT
YANKAUER SUCT BULB TIP NO VENT (SUCTIONS) ×1 IMPLANT

## 2021-12-25 NOTE — Anesthesia Postprocedure Evaluation (Signed)
Anesthesia Post Note  Patient: Marc Gilbert  Procedure(s) Performed: LEFT TOTAL HIP ARTHROPLASTY ANTERIOR APPROACH (Left: Hip)     Patient location during evaluation: PACU Anesthesia Type: Spinal Level of consciousness: oriented and awake and alert Pain management: pain level controlled Vital Signs Assessment: post-procedure vital signs reviewed and stable Respiratory status: spontaneous breathing, respiratory function stable and nonlabored ventilation Cardiovascular status: blood pressure returned to baseline and stable Postop Assessment: no headache, no backache, no apparent nausea or vomiting, spinal receding and patient able to bend at knees Anesthetic complications: no   No notable events documented.  Last Vitals:  Vitals:   12/25/21 1100 12/25/21 1130  BP: 109/60 112/63  Pulse: 72 65  Resp: 18 12  Temp:    SpO2: 96% 93%    Last Pain:  Vitals:   12/25/21 1130  TempSrc:   PainSc: 0-No pain    LLE Motor Response: No movement due to regional block (12/25/21 1130) LLE Sensation: Numbness (12/25/21 1130) RLE Motor Response: No movement due to regional block (12/25/21 1130) RLE Sensation: Numbness (12/25/21 1130) L Sensory Level: L3-Anterior knee, lower leg (12/25/21 1130) R Sensory Level: L3-Anterior knee, lower leg (12/25/21 1130)  Classie Weng A.

## 2021-12-25 NOTE — H&P (Signed)
TOTAL HIP ADMISSION H&P  Patient is admitted for left total hip arthroplasty.  Subjective:  Chief Complaint: left hip pain  HPI: Marc Gilbert, 88 y.o. male, has a history of pain and functional disability in the left hip(s) due to arthritis and patient has failed non-surgical conservative treatments for greater than 12 weeks to include NSAID's and/or analgesics, corticosteriod injections, use of assistive devices, and activity modification.  Onset of symptoms was gradual starting 5 years ago with gradually worsening course since that time.The patient noted no past surgery on the left hip(s).  Patient currently rates pain in the left hip at 10 out of 10 with activity. Patient has night pain, worsening of pain with activity and weight bearing, trendelenberg gait, pain that interfers with activities of daily living, and pain with passive range of motion. Patient has evidence of subchondral sclerosis, periarticular osteophytes, and joint space narrowing by imaging studies. This condition presents safety issues increasing the risk of falls.  There is no current active infection.  Patient Active Problem List   Diagnosis Date Noted   Trochanteric bursitis, left hip 08/20/2021   Unilateral primary osteoarthritis, left hip 05/27/2021   Low back pain 05/27/2021   DVT (deep venous thrombosis) ([place]) 07/02/2020   S/P reverse total shoulder arthroplasty, right 06/18/2020   Osteoarthritis of right shoulder 05/16/2020   Malignant neoplasm of prostate ([place]) 04/25/2019   Dyslipidemia 01/03/2019   Pain in right shoulder 09/20/2018   Unilateral primary osteoarthritis, right knee 09/20/2018   Bilateral hearing loss 12/07/2016   Bilateral impacted cerumen 12/07/2016   Presence of aortocoronary bypass graft 09/17/2016   Coronary artery disease of native heart with stable angina pectoris ([place]) 09/17/2016   Unstable angina ([place]) 09/17/2016   Hx of CABG 08/14/2016   Chest pain 08/10/2016   CKD (chronic kidney  disease) stage 3, GFR 30-59 ml/min (HCC) 08/30/2015   Hyperlipidemia 08/30/2015   Osteoarthritis of hip 05/01/2011   Hypertension 05/01/2011   COPD (chronic obstructive pulmonary disease) ([place]) 05/01/2011   Cancer of skin, face 05/01/2011   Past Medical History:  Diagnosis Date   Arthritis    "right hand; back" (08/30/2015)   Cancer (HCC)    skin  squamous and basal   Chronic lower back pain    CKD (chronic kidney disease), stage III (HCC)    Stage III   COPD (chronic obstructive pulmonary disease) (HCC)    no home O2   Coronary artery disease    Dysplastic colon polyp age 4   carcinoma in situ   GERD (gastroesophageal reflux disease)    occ   H/O hiatal hernia    Hyperlipidemia    Hypertension    Prostate cancer ([place]) 07/2014   active surveillance, Glisson 6    Past Surgical History:  Procedure Laterality Date   CARDIAC CATHETERIZATION     CATARACT EXTRACTION W/ INTRAOCULAR LENS IMPLANT Left 08/27/2015   CORONARY ARTERY BYPASS GRAFT N/A 08/14/2016   Procedure: CORONARY ARTERY BYPASS GRAFTING (CABG)x2, using left internal mammary artery and right greater saphenous vein harvested endoscopically;  Surgeon: Gaye Pollack, MD;  Location: [place];  Service: Open Heart Surgery;  Laterality: N/A;   JOINT REPLACEMENT     LEFT HEART CATH AND CORONARY ANGIOGRAPHY N/A 08/11/2016   Procedure: Left Heart Cath and Coronary Angiography;  Surgeon: Belva Crome, MD;  Location: [place] [facility];  Service: Cardiovascular;  Laterality: N/A;   MOHS SURGERY     multiple SCC   PROSTATE BIOPSY  07/18/2014  TEE WITHOUT CARDIOVERSION N/A 08/14/2016   Procedure: TRANSESOPHAGEAL ECHOCARDIOGRAM (TEE);  Surgeon: Gaye Pollack, MD;  Location: [place];  Service: Open Heart Surgery;  Laterality: N/A;   TONSILLECTOMY     TOTAL HIP ARTHROPLASTY  05/26/2011   Procedure: TOTAL HIP ARTHROPLASTY;  Surgeon: Garald Balding, MD;  Location: [place];  Service: Orthopedics;  Laterality: Right;   TOTAL SHOULDER  ARTHROPLASTY Right 06/18/2020   Procedure: TOTAL SHOULDER ARTHROPLASTY;  Surgeon: Marchia Bond, MD;  [facility];  Service: Orthopedics;  Laterality: Right;   ULTRASOUND GUIDANCE FOR VASCULAR ACCESS  08/11/2016   Procedure: Ultrasound Guidance For Vascular Access;  Surgeon: Belva Crome, MD;  Location: [place] [facility];  Service: Cardiovascular;;    Current Facility-Administered Medications  Medication Dose Route Frequency Provider Last Rate Last Admin   ceFAZolin (ANCEF) IVPB 2g/100 mL premix  2 g Intravenous On Call to OR Pete Pelt, PA-C       lactated ringers infusion   Intravenous Continuous Effie Berkshire, MD 10 mL/hr at 12/25/21 0800 New Bag at 12/25/21 0800   tranexamic acid (CYKLOKAPRON) IVPB 1,000 mg  1,000 mg Intravenous To OR Pete Pelt, PA-C       No Known Allergies  Social History   Tobacco Use   Smoking status: Former    Packs/day: 0.50    Years: 25.00    Total pack years: 12.50    Types: Cigarettes    Quit date: 05/24/1988    Years since quitting: 33.6   Smokeless tobacco: Never  Substance Use Topics   Alcohol use: Yes    Alcohol/week: 10.0 standard drinks of alcohol    Types: 5 Glasses of wine, 5 Cans of beer per week    Comment: daily    Family History  Problem Relation Age of Onset   Breast cancer Mother 6       mastectomy   Diabetes Father    Breast cancer Daughter        2005. Lumpectomy with chemo and radiation. Under the care of Dr. Jana Hakim     Pancreatic cancer Neg Hx    Colon cancer Neg Hx    Prostate cancer Neg Hx      Review of Systems  Objective:  Physical Exam Vitals reviewed.  Constitutional:      Appearance: Normal appearance. He is normal weight.  HENT:     Head: Normocephalic and atraumatic.  Eyes:     Extraocular Movements: Extraocular movements intact.     Pupils: Pupils are equal, round, and reactive to light.  Cardiovascular:     Rate and Rhythm: Normal rate and regular rhythm.  Pulmonary:      Effort: Pulmonary effort is normal.     Breath sounds: Normal breath sounds.  Abdominal:     Palpations: Abdomen is soft.  Musculoskeletal:     Cervical back: Normal range of motion and neck supple.     Left hip: Tenderness and bony tenderness present. Decreased range of motion. Decreased strength.  Neurological:     Mental Status: He is alert and oriented to person, place, and time.  Psychiatric:        Behavior: Behavior normal.     Vital signs in last 24 hours: Temp:  [97.9 F (36.6 C)] 97.9 F (36.6 C) (11/09 0749) Pulse Rate:  [60] 60 (11/09 0749) Resp:  [16] 16 (11/09 0749) BP: (162)/(77) 162/77 (11/09 0749) SpO2:  [98 %] 98 % (11/09 0749) Weight:  [97.5 kg]  97.5 kg (11/09 0757)  Labs:   Estimated body mass index is 29.16 kg/m as calculated from the following:   Height as of this encounter: 6' (1.829 m).   Weight as of this encounter: 97.5 kg.   Imaging Review Plain radiographs demonstrate severe degenerative joint disease of the left hip(s). The bone quality appears to be good for age and reported activity level.      Assessment/Plan:  End stage arthritis, left hip(s)  The patient history, physical examination, clinical judgement of the provider and imaging studies are consistent with end stage degenerative joint disease of the left hip(s) and total hip arthroplasty is deemed medically necessary. The treatment options including medical management, injection therapy, arthroscopy and arthroplasty were discussed at length. The risks and benefits of total hip arthroplasty were presented and reviewed. The risks due to aseptic loosening, infection, stiffness, dislocation/subluxation,  thromboembolic complications and other imponderables were discussed.  The patient acknowledged the explanation, agreed to proceed with the plan and consent was signed. Patient is being admitted for inpatient treatment for surgery, pain control, PT, OT, prophylactic antibiotics, VTE  prophylaxis, progressive ambulation and ADL's and discharge planning.The patient is planning to be discharged to skilled nursing facility

## 2021-12-25 NOTE — Plan of Care (Signed)

## 2021-12-25 NOTE — Anesthesia Procedure Notes (Signed)
Spinal  Patient location during procedure: OR Start time: 12/25/2021 9:26 AM End time: 12/25/2021 9:28 AM Reason for block: surgical anesthesia Staffing Performed: resident/CRNA  Anesthesiologist: Josephine Igo, MD Resident/CRNA: Lind Covert, CRNA Performed by: Lind Covert, CRNA Authorized by: Lind Covert, CRNA   Preanesthetic Checklist Completed: patient identified, IV checked, site marked, risks and benefits discussed, surgical consent, monitors and equipment checked, pre-op evaluation and timeout performed Spinal Block Patient position: sitting Prep: DuraPrep Patient monitoring: heart rate, cardiac monitor, continuous pulse ox and blood pressure Location: L3-4 Injection technique: single-shot Needle Needle type: Pencan  Needle gauge: 24 G Needle length: 10 cm Needle insertion depth: 7 cm Assessment Sensory level: T6 Events: CSF return Additional Notes Timeout performed. Patient with scoliosis. L3-4 identified. Cleansed with duraprep. SAB without difficulty. To supine position.

## 2021-12-25 NOTE — Evaluation (Signed)
Physical Therapy Evaluation Patient Details Name: Marc Gilbert MRN: [id number] DOB: [birth year] Today's Date: 12/25/2021  History of Present Illness  Pt s/p L THR and with hx of CKD, COPD, CAD, R TSR, R THR (2013 by posterior approach), CABG  Clinical Impression  Pt s/p L THR and presents with decreased L LE strength/ROM and post op pain limiting functional mobility.  Pt lives in [place] living at [facility] and plans follow up at [facility].     Recommendations for follow up therapy are one component of a multi-disciplinary discharge planning process, led by the attending physician.  Recommendations may be updated based on patient status, additional functional criteria and insurance authorization.  Follow Up Recommendations Follow physician's recommendations for discharge plan and follow up therapies      Assistance Recommended at Discharge Intermittent Supervision/Assistance  Patient can return home with the following  A little help with walking and/or transfers;A little help with bathing/dressing/bathroom;Assistance with cooking/housework;Help with stairs or ramp for entrance;Assist for transportation    Equipment Recommendations None recommended by PT  Recommendations for Other Services       Functional Status Assessment Patient has had a recent decline in their functional status and demonstrates the ability to make significant improvements in function in a reasonable and predictable amount of time.     Precautions / Restrictions Precautions Precautions: Fall Restrictions Weight Bearing Restrictions: No Other Position/Activity Restrictions: WBAT      Mobility  Bed Mobility Overal bed mobility: Needs Assistance Bed Mobility: Supine to Sit     Supine to sit: Min assist, Mod assist     General bed mobility comments: cues for sequence, posture and position from RW    Transfers Overall transfer level: Needs assistance Equipment used: Rolling walker (2  wheels) Transfers: Sit to/from Stand Sit to Stand: Min assist, From elevated surface           General transfer comment: cues for LE management and use of UEs to self assist    Ambulation/Gait Ambulation/Gait assistance: Min assist Gait Distance (Feet): 95 Feet Assistive device: Rolling walker (2 wheels) Gait Pattern/deviations: Step-to pattern, Decreased step length - right, Decreased step length - left, Shuffle, Trunk flexed Gait velocity: decr     General Gait Details: cues for sequence, posture and position from [employer]            Wheelchair Mobility    Modified Rankin (Stroke Patients Only)       Balance Overall balance assessment: Needs assistance Sitting-balance support: No upper extremity supported, Feet supported Sitting balance-Leahy Scale: Good     Standing balance support: Bilateral upper extremity supported Standing balance-Leahy Scale: Poor                               Pertinent Vitals/Pain Pain Assessment Pain Assessment: 0-10 Pain Score: 4  Pain Location: L hip Pain Descriptors / Indicators: Aching, Sore Pain Intervention(s): Limited activity within patient's tolerance, Monitored during session, Premedicated before session, Ice applied    Home Living Family/patient expects to be discharged to:: Other (Comment)                   Additional Comments: Pt plans dc to rehab unit at [facility] where he resides in IND living    Prior Function Prior Level of Function : Independent/Modified Independent             Mobility Comments: cane, RW, or rollator  as needed       Hand Dominance   Dominant Hand: Right    Extremity/Trunk Assessment   Upper Extremity Assessment Upper Extremity Assessment: Overall WFL for tasks assessed    Lower Extremity Assessment Lower Extremity Assessment: LLE deficits/detail       Communication   Communication: HOH  Cognition Arousal/Alertness: Awake/alert Behavior During  Therapy: WFL for tasks assessed/performed Overall Cognitive Status: Within Functional Limits for tasks assessed                                          General Comments      Exercises Total Joint Exercises Ankle Circles/Pumps: AROM, Both, 15 reps, Supine   Assessment/Plan    PT Assessment Patient needs continued PT services  PT Problem List Decreased strength;Decreased range of motion;Decreased activity tolerance;Decreased balance;Decreased mobility;Decreased knowledge of use of DME       PT Treatment Interventions DME instruction;Gait training;Patient/family education;Functional mobility training;Therapeutic activities;Therapeutic exercise    PT Goals (Current goals can be found in the Care Plan section)  Acute Rehab PT Goals Patient Stated Goal: Regain IND PT Goal Formulation: With patient Time For Goal Achievement: 01/08/22 Potential to Achieve Goals: Good    Frequency 7X/week     Co-evaluation               AM-PAC PT "6 Clicks" Mobility  Outcome Measure Help needed turning from your back to your side while in a flat bed without using bedrails?: A Lot Help needed moving from lying on your back to sitting on the side of a flat bed without using bedrails?: A Lot Help needed moving to and from a bed to a chair (including a wheelchair)?: A Little Help needed standing up from a chair using your arms (e.g., wheelchair or bedside chair)?: A Little Help needed to walk in hospital room?: A Little Help needed climbing 3-5 steps with a railing? : A Lot 6 Click Score: 15    End of Session Equipment Utilized During Treatment: Gait belt Activity Tolerance: Patient tolerated treatment well Patient left: in chair;with call bell/phone within reach;with chair alarm set Nurse Communication: Mobility status PT Visit Diagnosis: Difficulty in walking, not elsewhere classified (R26.2)    Time: 9528-4132 PT Time Calculation (min) (ACUTE ONLY): 30  min   Charges:   PT Evaluation $PT Eval Low Complexity: 1 Low PT Treatments $Gait Training: 8-22 mins        [place] Pager [phone] Office [phone]   Rowena Moilanen 12/25/2021, 4:54 PM

## 2021-12-25 NOTE — Transfer of Care (Signed)
Immediate Anesthesia Transfer of Care Note  Patient: [name]  Procedure(s) Performed: LEFT TOTAL HIP ARTHROPLASTY ANTERIOR APPROACH (Left: Hip)  Patient Location: PACU  Anesthesia Type:Spinal  Level of Consciousness: sedated  Airway & Oxygen Therapy: Patient Spontanous Breathing and Patient connected to face mask oxygen  Post-op Assessment: Report given to RN and Post -op Vital signs reviewed and stable  Post vital signs: Reviewed and stable  Last Vitals:  Vitals Value Taken Time  BP    Temp    Pulse 74 12/25/21 1049  Resp 20 12/25/21 1049  SpO2 100 % 12/25/21 1049  Vitals shown include unvalidated device data.  Last Pain:  Vitals:   12/25/21 0757  TempSrc:   PainSc: 0-No pain      Patients Stated Pain Goal: 4 (23/34/35 6861)  Complications: No notable events documented.

## 2021-12-25 NOTE — Op Note (Signed)
Operative Note  Date of operation: 12/25/2021 Preoperative diagnosis: Severe left hip osteoarthritis Postoperative diagnosis: Same  Procedure: Left direct anterior total hip arthroplasty  Implants: DePuy sector GRIPTION acetabular opponent size 58, size 36+4 polythene liner, size 6 Actis femoral component with high offset, 36 +5 metal head ball  Surgeon: Lind Guest. Ninfa Linden, MD Assistant: Benita Stabile, PA-C  Anesthesia: Spinal EBL: 300 cc Antibiotics: 2 g IV Ancef Complications: None  Indications: The patient is an 88 year old gentleman with debilitating arthritis involving his left hip.  This been well-documented with x-rays as well as clinical exam findings.  At this point his left hip pain is daily.  He ambulates with a rolling walker.  It is detrimentally affecting his mobility, his quality of life and his actives daily living to the point he does wish to proceed with a hip replacement.  We had a long and thorough discussion about the risk of acute blood loss anemia, nerve or vessel injury, fracture, infection, dislocation, DVT, implant failure, leg length differences and wound healing issues.  He understands her goals are hopefully decrease pain, improve mobility and improve quality of life.  Procedure description: After informed consent was obtained and the appropriate left hip was marked, the patient was brought to the operating room and set up on the stretcher where spinal anesthesia was obtained.  He was then laid in supine position on the operating table and a Foley catheter was placed.  We assess his leg length and then placed traction boots on both his feet.  Next he is placed supine on the Hana fracture table with a perineal post in place in both legs and inline skeletal traction devices but no traction applied.  His hip was assessed radiographically and the pelvis is well so we could have a preoperative indication of what we need to do in terms of maximizing his offset and leg  length.  Of note he does seem a little shorter on his left side than right.  A timeout was called and he identified his correct patient correct left hip.  We then made incision just inferior and posterior to the anterior superior iliac spine and carried this slightly obliquely down the leg.  Dissection was carried down to the tensor fascia lata muscle and the tensor fascia was then divided longitudinally to proceed with a directed approach to the hip.  Circumflex vessels were identified and cauterized.  The hip capsule identified and opened up in an L-type format finding a large joint effusion.  There was significant periarticular osteophytes around the lateral femoral head neck.  A femoral neck cut was then made with an oscillating saw just proximal to the lesser trochanter.  This was completed with an osteotome.  A corkscrew guide was placed in the femoral head and the femoral head was removed in its entirety.  There was a significant area load of cartilage.  A bent Hohmann was then placed over the medial acetabular rim and remnants that sterile labrum and other debris removed.  I then began reaming from a size 43 reamer and stepwise increments going to a size 57 reamer with all reamers placed in direct visitation in the last reamer was placed under direct fluoroscopy so we could obtain our depth of reaming, our inclination and the anteversion.  The real DePuy sector GRIPTION acetabular 58 was then placed without difficulty and it was assessed radiographically.  A 36+4 polythene liner was placed for that size 58 acetabular component.  Attention was then turned to the  femur.  With the leg externally rotated to 120 degrees, extended and brought under the other leg, a Mueller retractor was placed medially and a Hohmann retractor was placed behind the greater trochanter.  The lateral joint capsule was released and a box cutting osteotome was used to enter femoral canal.  Broaching was then started from a size 0 broach  going up to a size 6 broach with all broaches placed in direct visualization.  We then trialed a high offset femoral neck and a 36+1.5 metal head ball.  The leg was brought over and up and then with traction and internal rotation reduced in the pelvis.  We felt like we needed to lengthen him.  We dislocated the hip and remove the trial components.  We placed the real Actis femoral component size 6 with high offset and went with the real 36+5 metal head ball.  This was reduced and acetabular pleased with leg length, range of motion, offset and stability when comparing her interoperative preoperative and postop films.  We assessed it radiographically and clinically and replaced.  The soft tissue was then irrigated with normal saline solution.  The joint capsule was closed with interrupted #1 Ethibond suture followed by 0 Vicryl close the deep tissue and a 2-0 Vicryl close subcutaneous tissue.  The skin was closed with staples.  An Aquacel dressing was applied.  The patient was taken off the Hana table and taken to the recovery room in stable addition with all final counts being correct and no complications noted.  Benita Stabile, PA-C did assist in the entire case from beginning to end and his assistance was medically necessary for helping soft tissue retraction and management, helping guide implant placement and a layered closure of the wound.

## 2021-12-25 NOTE — Discharge Instructions (Addendum)
Information on my medicine - ELIQUIS (apixaban)  This medication education was reviewed with me or my healthcare representative as part of my discharge preparation.  The pharmacist that spoke with me during my hospital stay was:    Why was Eliquis prescribed for you? Eliquis was prescribed for you to reduce the risk of blood clots forming after orthopedic surgery.    What do You need to know about Eliquis? Take your Eliquis TWICE DAILY - one tablet in the morning and one tablet in the evening with or without food.  It would be best to take the dose about the same time each day.  If you have difficulty swallowing the tablet whole please discuss with your pharmacist how to take the medication safely.  Take Eliquis exactly as prescribed by your doctor and DO NOT stop taking Eliquis without talking to the doctor who prescribed the medication.  Stopping without other medication to take the place of Eliquis may increase your risk of developing a clot.  After discharge, you should have regular check-up appointments with your healthcare provider that is prescribing your Eliquis.  What do you do if you miss a dose? If a dose of ELIQUIS is not taken at the scheduled time, take it as soon as possible on the same day and twice-daily administration should be resumed.  The dose should not be doubled to make up for a missed dose.  Do not take more than one tablet of ELIQUIS at the same time.  Important Safety Information A possible side effect of Eliquis is bleeding. You should call your healthcare provider right away if you experience any of the following: Bleeding from an injury or your nose that does not stop. Unusual colored urine (red or dark brown) or unusual colored stools (red or black). Unusual bruising for unknown reasons. A serious fall or if you hit your head (even if there is no bleeding).  Some medicines may interact with Eliquis and might increase your risk of bleeding or  clotting while on Eliquis. To help avoid this, consult your healthcare provider or pharmacist prior to using any new prescription or non-prescription medications, including herbals, vitamins, non-steroidal anti-inflammatory drugs (NSAIDs) and supplements.  This website has more information on Eliquis (apixaban): http://www.eliquis.com/eliquis/home   INSTRUCTIONS AFTER JOINT REPLACEMENT   Remove items at home which could result in a fall. This includes throw rugs or furniture in walking pathways ICE to the affected joint every three hours while awake for 30 minutes at a time, for at least the first 3-5 days, and then as needed for pain and swelling.  Continue to use ice for pain and swelling. You may notice swelling that will progress down to the foot and ankle.  This is normal after surgery.  Elevate your leg when you are not up walking on it.   Continue to use the breathing machine you got in the hospital (incentive spirometer) which will help keep your temperature down.  It is common for your temperature to cycle up and down following surgery, especially at night when you are not up moving around and exerting yourself.  The breathing machine keeps your lungs expanded and your temperature down.   DIET:  As you were doing prior to hospitalization, we recommend a well-balanced diet.  DRESSING / WOUND CARE / SHOWERING  Keep the surgical dressing until follow up.  The dressing is water proof, so you can shower without any extra covering.  IF THE DRESSING FALLS OFF or the wound gets  wet inside, change the dressing with sterile gauze.  Please use good hand washing techniques before changing the dressing.  Do not use any lotions or creams on the incision until instructed by your surgeon.    ACTIVITY  Increase activity slowly as tolerated, but follow the weight bearing instructions below.   No driving for 6 weeks or until further direction given by your physician.  You cannot drive while taking  narcotics.  No lifting or carrying greater than 10 lbs. until further directed by your surgeon. Avoid periods of inactivity such as sitting longer than an hour when not asleep. This helps prevent blood clots.  You may return to work once you are authorized by your doctor.     WEIGHT BEARING   Weight bearing as tolerated with assist device (walker, cane, etc) as directed, use it as long as suggested by your surgeon or therapist, typically at least 4-6 weeks.   EXERCISES  Results after joint replacement surgery are often greatly improved when you follow the exercise, range of motion and muscle strengthening exercises prescribed by your doctor. Safety measures are also important to protect the joint from further injury. Any time any of these exercises cause you to have increased pain or swelling, decrease what you are doing until you are comfortable again and then slowly increase them. If you have problems or questions, call your caregiver or physical therapist for advice.   Rehabilitation is important following a joint replacement. After just a few days of immobilization, the muscles of the leg can become weakened and shrink (atrophy).  These exercises are designed to build up the tone and strength of the thigh and leg muscles and to improve motion. Often times heat used for twenty to thirty minutes before working out will loosen up your tissues and help with improving the range of motion but do not use heat for the first two weeks following surgery (sometimes heat can increase post-operative swelling).   These exercises can be done on a training (exercise) mat, on the floor, on a table or on a bed. Use whatever works the best and is most comfortable for you.    Use music or television while you are exercising so that the exercises are a pleasant break in your day. This will make your life better with the exercises acting as a break in your routine that you can look forward to.   Perform all  exercises about fifteen times, three times per day or as directed.  You should exercise both the operative leg and the other leg as well.  Exercises include:   Quad Sets - Tighten up the muscle on the front of the thigh (Quad) and hold for 5-10 seconds.   Straight Leg Raises - With your knee straight (if you were given a brace, keep it on), lift the leg to 60 degrees, hold for 3 seconds, and slowly lower the leg.  Perform this exercise against resistance later as your leg gets stronger.  Leg Slides: Lying on your back, slowly slide your foot toward your buttocks, bending your knee up off the floor (only go as far as is comfortable). Then slowly slide your foot back down until your leg is flat on the floor again.  Angel Wings: Lying on your back spread your legs to the side as far apart as you can without causing discomfort.  Hamstring Strength:  Lying on your back, push your heel against the floor with your leg straight by tightening up the muscles  of your buttocks.  Repeat, but this time bend your knee to a comfortable angle, and push your heel against the floor.  You may put a pillow under the heel to make it more comfortable if necessary.   A rehabilitation program following joint replacement surgery can speed recovery and prevent re-injury in the future due to weakened muscles. Contact your doctor or a physical therapist for more information on knee rehabilitation.    CONSTIPATION  Constipation is defined medically as fewer than three stools per week and severe constipation as less than one stool per week.  Even if you have a regular bowel pattern at home, your normal regimen is likely to be disrupted due to multiple reasons following surgery.  Combination of anesthesia, postoperative narcotics, change in appetite and fluid intake all can affect your bowels.   YOU MUST use at least one of the following options; they are listed in order of increasing strength to get the job done.  They are all  available over the counter, and you may need to use some, POSSIBLY even all of these options:    Drink plenty of fluids (prune juice may be helpful) and high fiber foods Colace 100 mg by mouth twice a day  Senokot for constipation as directed and as needed Dulcolax (bisacodyl), take with full glass of water  Miralax (polyethylene glycol) once or twice a day as needed.  If you have tried all these things and are unable to have a bowel movement in the first 3-4 days after surgery call either your surgeon or your primary doctor.    If you experience loose stools or diarrhea, hold the medications until you stool forms back up.  If your symptoms do not get better within 1 week or if they get worse, check with your doctor.  If you experience "the worst abdominal pain ever" or develop nausea or vomiting, please contact the office immediately for further recommendations for treatment.   ITCHING:  If you experience itching with your medications, try taking only a single pain pill, or even half a pain pill at a time.  You can also use Benadryl over the counter for itching or also to help with sleep.   TED HOSE STOCKINGS:  Use stockings on both legs until for at least 2 weeks or as directed by physician office. They may be removed at night for sleeping.  MEDICATIONS:  See your medication summary on the "After Visit Summary" that nursing will review with you.  You may have some home medications which will be placed on hold until you complete the course of blood thinner medication.  It is important for you to complete the blood thinner medication as prescribed.  PRECAUTIONS:  If you experience chest pain or shortness of breath - call 911 immediately for transfer to the hospital emergency department.   If you develop a fever greater that 101 F, purulent drainage from wound, increased redness or drainage from wound, foul odor from the wound/dressing, or calf pain - CONTACT YOUR SURGEON.                                                    FOLLOW-UP APPOINTMENTS:  If you do not already have a post-op appointment, please call the office for an appointment to be seen by your surgeon.  Guidelines for how soon  to be seen are listed in your "After Visit Summary", but are typically between 1-4 weeks after surgery.  OTHER INSTRUCTIONS:   Knee Replacement:  Do not place pillow under knee, focus on keeping the knee straight while resting. CPM instructions: 0-90 degrees, 2 hours in the morning, 2 hours in the afternoon, and 2 hours in the evening. Place foam block, curve side up under heel at all times except when in CPM or when walking.  DO NOT modify, tear, cut, or change the foam block in any way.  POST-OPERATIVE OPIOID TAPER INSTRUCTIONS: It is important to wean off of your opioid medication as soon as possible. If you do not need pain medication after your surgery it is ok to stop day one. Opioids include: Codeine, Hydrocodone(Norco, Vicodin), Oxycodone(Percocet, oxycontin) and hydromorphone amongst others.  Long term and even short term use of opiods can cause: Increased pain response Dependence Constipation Depression Respiratory depression And more.  Withdrawal symptoms can include Flu like symptoms Nausea, vomiting And more Techniques to manage these symptoms Hydrate well Eat regular healthy meals Stay active Use relaxation techniques(deep breathing, meditating, yoga) Do Not substitute Alcohol to help with tapering If you have been on opioids for less than two weeks and do not have pain than it is ok to stop all together.  Plan to wean off of opioids This plan should start within one week post op of your joint replacement. Maintain the same interval or time between taking each dose and first decrease the dose.  Cut the total daily intake of opioids by one tablet each day Next start to increase the time between doses. The last dose that should be eliminated is the evening dose.   MAKE SURE  YOU:  Understand these instructions.  Get help right away if you are not doing well or get worse.    Thank you for letting us be a part of your medical care team.  It is a privilege we respect greatly.  We hope these instructions will help you stay on track for a fast and full recovery!

## 2021-12-26 ENCOUNTER — Encounter (HOSPITAL_COMMUNITY): Payer: Self-pay | Admitting: Orthopaedic Surgery

## 2021-12-26 DIAGNOSIS — I129 Hypertensive chronic kidney disease with stage 1 through stage 4 chronic kidney disease, or unspecified chronic kidney disease: Secondary | ICD-10-CM | POA: Diagnosis not present

## 2021-12-26 DIAGNOSIS — M1612 Unilateral primary osteoarthritis, left hip: Secondary | ICD-10-CM | POA: Diagnosis not present

## 2021-12-26 DIAGNOSIS — Z8546 Personal history of malignant neoplasm of prostate: Secondary | ICD-10-CM | POA: Diagnosis not present

## 2021-12-26 DIAGNOSIS — Z85828 Personal history of other malignant neoplasm of skin: Secondary | ICD-10-CM | POA: Diagnosis not present

## 2021-12-26 DIAGNOSIS — J449 Chronic obstructive pulmonary disease, unspecified: Secondary | ICD-10-CM | POA: Diagnosis not present

## 2021-12-26 DIAGNOSIS — N183 Chronic kidney disease, stage 3 unspecified: Secondary | ICD-10-CM | POA: Diagnosis not present

## 2021-12-26 LAB — CBC
HCT: 35.3 % — ABNORMAL LOW (ref 39.0–52.0)
Hemoglobin: 11.5 g/dL — ABNORMAL LOW (ref 13.0–17.0)
MCH: 31.9 pg (ref 26.0–34.0)
MCHC: 32.6 g/dL (ref 30.0–36.0)
MCV: 97.8 fL (ref 80.0–100.0)
Platelets: 171 10*3/uL (ref 150–400)
RBC: 3.61 MIL/uL — ABNORMAL LOW (ref 4.22–5.81)
RDW: 12.4 % (ref 11.5–15.5)
WBC: 10.4 10*3/uL (ref 4.0–10.5)
nRBC: 0 % (ref 0.0–0.2)

## 2021-12-26 LAB — BASIC METABOLIC PANEL
Anion gap: 6 (ref 5–15)
BUN: 20 mg/dL (ref 8–23)
CO2: 25 mmol/L (ref 22–32)
Calcium: 8.1 mg/dL — ABNORMAL LOW (ref 8.9–10.3)
Chloride: 106 mmol/L (ref 98–111)
Creatinine, Ser: 1.19 mg/dL (ref 0.61–1.24)
GFR, Estimated: 58 mL/min — ABNORMAL LOW (ref >60)
Glucose, Bld: 136 mg/dL — ABNORMAL HIGH (ref 70–99)
Potassium: 4.4 mmol/L (ref 3.5–5.1)
Sodium: 137 mmol/L (ref 135–145)

## 2021-12-26 MED ORDER — HYDROCODONE-ACETAMINOPHEN 5-325 MG PO TABS: 1.0000 | ORAL_TABLET | Freq: Four times a day (QID) | ORAL | Status: DC | PRN

## 2021-12-26 NOTE — Progress Notes (Signed)
Physical Therapy Treatment Patient Details Name: Marc Gilbert MRN: [id number] DOB: February 06, 1931 Today's Date: 12/26/2021   History of Present Illness Pt s/p L THR and with hx of CKD, COPD, CAD, R TSR, R THR (2013 by posterior approach), CABG    PT Comments    Pt continues very motivated but progress limited this pm by increased pain.  Pt requiring increased time to ambulate limited distance in hall and return to bed.  Physician aware and making changes to pt pain meds.  Pt hopeful for dc to [facility].   Recommendations for follow up therapy are one component of a multi-disciplinary discharge planning process, led by the attending physician.  Recommendations may be updated based on patient status, additional functional criteria and insurance authorization.  Follow Up Recommendations  Follow physician's recommendations for discharge plan and follow up therapies     Assistance Recommended at Discharge Intermittent Supervision/Assistance  Patient can return home with the following A little help with walking and/or transfers;A little help with bathing/dressing/bathroom;Assistance with cooking/housework;Help with stairs or ramp for entrance;Assist for transportation   Equipment Recommendations  None recommended by PT    Recommendations for Other Services       Precautions / Restrictions Precautions Precautions: Fall Restrictions Weight Bearing Restrictions: No Other Position/Activity Restrictions: WBAT     Mobility  Bed Mobility Overal bed mobility: Needs Assistance Bed Mobility: Sit to Supine     Supine to sit: Min assist Sit to supine: Mod assist   General bed mobility comments: cues for sequence and assist to manage bil LEs into bed    Transfers Overall transfer level: Needs assistance Equipment used: Rolling walker (2 wheels) Transfers: Sit to/from Stand Sit to Stand: Min assist           General transfer comment: cues for LE management and use of UEs  to self assist    Ambulation/Gait Ambulation/Gait assistance: Min assist, Min guard Gait Distance (Feet): 48 Feet Assistive device: Rolling walker (2 wheels) Gait Pattern/deviations: Decreased step length - right, Decreased step length - left, Shuffle, Trunk flexed, Step-to pattern Gait velocity: decr     General Gait Details: cues for sequence, posture and position from RW; distance pain limited   Stairs             Wheelchair Mobility    Modified Rankin (Stroke Patients Only)       Balance Overall balance assessment: Needs assistance Sitting-balance support: No upper extremity supported, Feet supported Sitting balance-Leahy Scale: Good     Standing balance support: Bilateral upper extremity supported Standing balance-Leahy Scale: Poor                              Cognition Arousal/Alertness: Awake/alert Behavior During Therapy: WFL for tasks assessed/performed Overall Cognitive Status: Within Functional Limits for tasks assessed                                          Exercises Total Joint Exercises Ankle Circles/Pumps: AROM, Both, 15 reps, Supine Quad Sets: AROM, Both, 10 reps, Supine Heel Slides: AAROM, Left, 20 reps, Supine Hip ABduction/ADduction: AAROM, Left, 15 reps, Supine    General Comments        Pertinent Vitals/Pain Pain Assessment Pain Assessment: 0-10 Pain Score: 7  Pain Location: L hip and thigh with activity Pain Descriptors / Indicators: Aching,  Sore, Grimacing Pain Intervention(s): Limited activity within patient's tolerance, Monitored during session, Premedicated before session, Ice applied    Home Living                          Prior Function            PT Goals (current goals can now be found in the care plan section) Acute Rehab PT Goals Patient Stated Goal: Regain IND PT Goal Formulation: With patient Time For Goal Achievement: 01/08/22 Potential to Achieve Goals:  Good Progress towards PT goals: Not progressing toward goals - comment (pain limited this pm)    Frequency    7X/week      PT Plan Current plan remains appropriate    Co-evaluation              AM-PAC PT "6 Clicks" Mobility   Outcome Measure  Help needed turning from your back to your side while in a flat bed without using bedrails?: A Little Help needed moving from lying on your back to sitting on the side of a flat bed without using bedrails?: A Little Help needed moving to and from a bed to a chair (including a wheelchair)?: A Little Help needed standing up from a chair using your arms (e.g., wheelchair or bedside chair)?: A Little Help needed to walk in hospital room?: A Little Help needed climbing 3-5 steps with a railing? : A Lot 6 Click Score: 17    End of Session Equipment Utilized During Treatment: Gait belt Activity Tolerance: Patient limited by pain Patient left: in bed;with call bell/phone within reach;with bed alarm set;with family/visitor present Nurse Communication: Mobility status PT Visit Diagnosis: Difficulty in walking, not elsewhere classified (R26.2)     Time: 4193-7902 PT Time Calculation (min) (ACUTE ONLY): 28 min  Charges:  $Gait Training: 8-22 mins $Therapeutic Activity: 8-22 mins                     Debe Coder PT Acute Rehabilitation Services Pager [phone] Office [phone]    Jaylee Freeze 12/26/2021, 3:10 PM

## 2021-12-26 NOTE — TOC Progression Note (Signed)
Transition of Care [facility]) - Progression Note    Patient Details  Name: DEPAUL ARIZPE MRN: [id number] Date of Birth: [birth year]  Transition of Care [facility]) CM/SW Contact  Lennart Pall, LCSW Phone Number: 12/26/2021, 2:37 PM  Clinical Narrative:    Met with pt, wife and daughter to review dc plans.  All report plan for SNF rehab at [facility] which I have confirmed with facility admissions coordinator.  Pt is eligible for [facility] waiver which allows him to admit to facility tomorrow if medically cleared.     Expected Discharge Plan: [place] Barriers to Discharge: No Barriers Identified  Expected Discharge Plan and Services Expected Discharge Plan: [place] In-house Referral: Clinical Social Work     Living arrangements for the past 2 months: [place]                 DME Arranged: N/A DME Agency: NA                   Social Determinants of Health (SDOH) Interventions    Readmission Risk Interventions     No data to display

## 2021-12-26 NOTE — Plan of Care (Signed)
  Problem: Education: Goal: Knowledge of General Education information will improve Description Including pain rating scale, medication(s)/side effects and non-pharmacologic comfort measures Outcome: Progressing   

## 2021-12-26 NOTE — Progress Notes (Signed)
Subjective: 1 Day Post-Op Procedure(s) (LRB): LEFT TOTAL HIP ARTHROPLASTY ANTERIOR APPROACH (Left) Patient reports pain as mild.    Objective: Vital signs in last 24 hours: Temp:  [97.4 F (36.3 C)-98.1 F (36.7 C)] 98 F (36.7 C) (11/10 0955) Pulse Rate:  [67-77] 74 (11/10 0955) Resp:  [17-20] 20 (11/10 0955) BP: (101-128)/(58-70) 101/61 (11/10 0955) SpO2:  [93 %-97 %] 95 % (11/10 0955)  Intake/Output from previous day: 11/09 0701 - 11/10 0700 In: 2190 [P.O.:240; I.V.:1900; IV Piggyback:50] Out: 2300 [Urine:2000; Blood:300] Intake/Output this shift: Total I/O In: 360 [P.O.:360] Out: 0   Recent Labs    12/26/21 0308  HGB 11.5*   Recent Labs    12/26/21 0308  WBC 10.4  RBC 3.61*  HCT 35.3*  PLT 171   Recent Labs    12/26/21 0308  NA 137  K 4.4  CL 106  CO2 25  BUN 20  CREATININE 1.19  GLUCOSE 136*  CALCIUM 8.1*   No results for input(s): "LABPT", "INR" in the last 72 hours.  Sensation intact distally Intact pulses distally Dorsiflexion/Plantar flexion intact Incision: dressing C/D/I   Assessment/Plan: 1 Day Post-Op Procedure(s) (LRB): LEFT TOTAL HIP ARTHROPLASTY ANTERIOR APPROACH (Left) Up with therapy Plan for discharge tomorrow Discharge to SNF      Marc Gilbert 12/26/2021, 1:34 PM

## 2021-12-26 NOTE — NC FL2 (Signed)
[place] LEVEL OF CARE SCREENING TOOL     IDENTIFICATION  Patient Name: Marc Gilbert Birthdate: August 20, 1930 Sex: male Admission Date (Current Location): 12/25/2021  [facility] [place] Number:  Herbalist and Address:  [facility],  [address] [place], [place]      Provider Number: [id number]  Attending Physician Name and Address:  [name],*  Relative Name and Phone Number:  daughter, Margaretha Glassing [phone]    Current Level of Care: Hospital Recommended Level of Care: [place] Prior Approval Number:    Date Approved/Denied:   PASRR Number: [id number]  Discharge Plan: SNF    Current Diagnoses: Patient Active Problem List   Diagnosis Date Noted   Status post total replacement of left hip 12/25/2021   Trochanteric bursitis, left hip 08/20/2021   Unilateral primary osteoarthritis, left hip 05/27/2021   Low back pain 05/27/2021   DVT (deep venous thrombosis) ([place]) 07/02/2020   S/P reverse total shoulder arthroplasty, right 06/18/2020   Osteoarthritis of right shoulder 05/16/2020   Malignant neoplasm of prostate ([place]) 04/25/2019   Dyslipidemia 01/03/2019   Pain in right shoulder 09/20/2018   Unilateral primary osteoarthritis, right knee 09/20/2018   Bilateral hearing loss 12/07/2016   Bilateral impacted cerumen 12/07/2016   Presence of aortocoronary bypass graft 09/17/2016   Coronary artery disease of native heart with stable angina pectoris ([place]) 09/17/2016   Unstable angina ([place]) 09/17/2016   Hx of CABG 08/14/2016   Chest pain 08/10/2016   CKD (chronic kidney disease) stage 3, GFR 30-59 ml/min (HCC) 08/30/2015   Hyperlipidemia 08/30/2015   Osteoarthritis of hip 05/01/2011   Hypertension 05/01/2011   COPD (chronic obstructive pulmonary disease) ([place]) 05/01/2011   Cancer of skin, face 05/01/2011    Orientation RESPIRATION BLADDER Height & Weight     Self, Time, Situation, Place   Normal Continent Weight: 215 lb (97.5 kg) Height:  6' (182.9 cm)  BEHAVIORAL SYMPTOMS/MOOD NEUROLOGICAL BOWEL NUTRITION STATUS      Continent Diet (regular)  AMBULATORY STATUS COMMUNICATION OF NEEDS Skin   Limited Assist Verbally Other (Comment) (surgical incision only)                       Personal Care Assistance Level of Assistance  Bathing, Dressing Bathing Assistance: Limited assistance   Dressing Assistance: Limited assistance     Functional Limitations Info             [place]  PT (By licensed PT), OT (By licensed OT)     PT Frequency: 5x/wk OT Frequency: 5x/wk            Contractures Contractures Info: Not present    Additional Factors Info  Code Status, Allergies Code Status Info: Full Allergies Info: NKDA           Current Medications (12/26/2021):  This is the current hospital active medication list Current Facility-Administered Medications  Medication Dose Route Frequency Provider Last Rate Last Admin   0.9 %  sodium chloride infusion   Intravenous Continuous [name], MD 75 mL/hr at 12/25/21 1400 New Bag at 12/25/21 1400   acetaminophen (TYLENOL) tablet 1,000 mg  1,000 mg Oral Q6H PRN [name], MD   1,000 mg at 12/26/21 0131   acetaminophen (TYLENOL) tablet 325-650 mg  325-650 mg Oral Q6H PRN [name], MD       alum & mag hydroxide-simeth (MAALOX/MYLANTA) 200-200-20 MG/5ML suspension 30 mL  30 mL Oral Q4H PRN [name], MD       apixaban Arne [place]) tablet 2.5 mg  2.5 mg Oral Q12H [name], MD   2.5 mg at 12/26/21 0900   atorvastatin (LIPITOR) tablet 40 mg  40 mg Oral QHS [name], MD   40 mg at 12/25/21 2131   diphenhydrAMINE (BENADRYL) 12.5 MG/5ML elixir 12.5-25 mg  12.5-25 mg Oral Q4H PRN [name], MD       docusate sodium (COLACE) capsule 100 mg  100 mg Oral BID [name], MD   100 mg at 12/26/21 0900    gabapentin (NEURONTIN) capsule 300 mg  300 mg Oral TID [name], MD   300 mg at 12/26/21 0900   HYDROmorphone (DILAUDID) injection 0.5-1 mg  0.5-1 mg Intravenous Q4H PRN [name], MD       melatonin tablet 10 mg  10 mg Oral QHS [name], MD   10 mg at 12/25/21 2130   menthol-cetylpyridinium (CEPACOL) lozenge 3 mg  1 lozenge Oral PRN [name], MD       Or   phenol (CHLORASEPTIC) mouth spray 1 spray  1 spray Mouth/Throat PRN [name], MD       methocarbamol (ROBAXIN) tablet 500 mg  500 mg Oral Q6H PRN [name], MD   500 mg at 12/26/21 0900   Or   methocarbamol (ROBAXIN) 500 mg in dextrose 5 % 50 mL IVPB  500 mg Intravenous Q6H PRN [name], MD       metoCLOPramide (REGLAN) tablet 5-10 mg  5-10 mg Oral Q8H PRN [name], MD       Or   metoCLOPramide (REGLAN) injection 5-10 mg  5-10 mg Intravenous Q8H PRN [name], MD       metoprolol succinate (TOPROL-XL) 24 hr tablet 50 mg  50 mg Oral BID [name], MD   50 mg at 12/26/21 0900   ondansetron (ZOFRAN) tablet 4 mg  4 mg Oral Q6H PRN [name], MD       Or   ondansetron [facility]) injection 4 mg  4 mg Intravenous Q6H PRN [name], MD       oxybutynin (DITROPAN) tablet 5 mg  5 mg Oral QHS [name], MD   5 mg at 12/25/21 2131   pantoprazole (PROTONIX) EC tablet 40 mg  40 mg Oral Daily [name], MD   40 mg at 12/26/21 0900   polyethylene glycol (MIRALAX / GLYCOLAX) packet 17 g  17 g Oral Daily PRN [name], MD       sodium chloride (OCEAN) 0.65 % nasal spray 1 spray  1 spray Each Nare QID [name], MD   1 spray at 12/25/21 1836   tamsulosin (FLOMAX) capsule 0.4 mg  0.4 mg Oral QHS [name], MD   0.4 mg at 12/25/21 2130   traMADol (ULTRAM) tablet 100 mg  100 mg Oral Q6H PRN [name], MD   100 mg  at 12/26/21 8416     Discharge Medications: Please see discharge summary for a list of discharge medications.  Relevant Imaging Results:  Relevant Lab Results:   Additional Information SS# [id number]  Lennart Pall, LCSW

## 2021-12-26 NOTE — Progress Notes (Signed)
Physical Therapy Treatment Patient Details Name: Marc Gilbert MRN: [id number] DOB: 26-Mar-1930 Today's Date: 12/26/2021   History of Present Illness Pt s/p L THR and with hx of CKD, COPD, CAD, R TSR, R THR (2013 by posterior approach), CABG    PT Comments    Pt very motivated and progressing steadily with mobility.  Therex program initiated and pt up to ambulate increased distance in hall.  Pt hopeful for dc to rehab setting tomorrow.  Recommendations for follow up therapy are one component of a multi-disciplinary discharge planning process, led by the attending physician.  Recommendations may be updated based on patient status, additional functional criteria and insurance authorization.  Follow Up Recommendations  Follow physician's recommendations for discharge plan and follow up therapies     Assistance Recommended at Discharge Intermittent Supervision/Assistance  Patient can return home with the following A little help with walking and/or transfers;A little help with bathing/dressing/bathroom;Assistance with cooking/housework;Help with stairs or ramp for entrance;Assist for transportation   Equipment Recommendations  None recommended by PT    Recommendations for Other Services       Precautions / Restrictions Precautions Precautions: Fall Restrictions Weight Bearing Restrictions: No Other Position/Activity Restrictions: WBAT     Mobility  Bed Mobility Overal bed mobility: Needs Assistance Bed Mobility: Supine to Sit     Supine to sit: Min assist     General bed mobility comments: cues for sequence, posture and position from RW    Transfers Overall transfer level: Needs assistance Equipment used: Rolling walker (2 wheels) Transfers: Sit to/from Stand Sit to Stand: Min assist           General transfer comment: cues for LE management and use of UEs to self assist    Ambulation/Gait Ambulation/Gait assistance: Min assist, Min guard Gait Distance (Feet):  100 Feet Assistive device: Rolling walker (2 wheels) Gait Pattern/deviations: Decreased step length - right, Decreased step length - left, Shuffle, Trunk flexed, Step-to pattern, Step-through pattern Gait velocity: decr     General Gait Details: cues for sequence, posture and position from [employer]             Wheelchair Mobility    Modified Rankin (Stroke Patients Only)       Balance Overall balance assessment: Needs assistance Sitting-balance support: No upper extremity supported, Feet supported Sitting balance-Leahy Scale: Good     Standing balance support: Single extremity supported Standing balance-Leahy Scale: Poor                              Cognition Arousal/Alertness: Awake/alert Behavior During Therapy: WFL for tasks assessed/performed Overall Cognitive Status: Within Functional Limits for tasks assessed                                          Exercises Total Joint Exercises Ankle Circles/Pumps: AROM, Both, 15 reps, Supine Quad Sets: AROM, Both, 10 reps, Supine Heel Slides: AAROM, Left, 20 reps, Supine Hip ABduction/ADduction: AAROM, Left, 15 reps, Supine    General Comments        Pertinent Vitals/Pain Pain Assessment Pain Assessment: 0-10 Pain Score: 4  Pain Location: L hip Pain Descriptors / Indicators: Aching, Sore Pain Intervention(s): Limited activity within patient's tolerance, Monitored during session, Premedicated before session, Ice applied    Home Living  Prior Function            PT Goals (current goals can now be found in the care plan section) Acute Rehab PT Goals Patient Stated Goal: Regain IND PT Goal Formulation: With patient Time For Goal Achievement: 01/08/22 Potential to Achieve Goals: Good Progress towards PT goals: Progressing toward goals    Frequency    7X/week      PT Plan Current plan remains appropriate    Co-evaluation               AM-PAC PT "6 Clicks" Mobility   Outcome Measure  Help needed turning from your back to your side while in a flat bed without using bedrails?: A Little Help needed moving from lying on your back to sitting on the side of a flat bed without using bedrails?: A Little Help needed moving to and from a bed to a chair (including a wheelchair)?: A Little Help needed standing up from a chair using your arms (e.g., wheelchair or bedside chair)?: A Little Help needed to walk in hospital room?: A Little Help needed climbing 3-5 steps with a railing? : A Lot 6 Click Score: 17    End of Session Equipment Utilized During Treatment: Gait belt Activity Tolerance: Patient tolerated treatment well Patient left: in chair;with call bell/phone within reach;with chair alarm set Nurse Communication: Mobility status PT Visit Diagnosis: Difficulty in walking, not elsewhere classified (R26.2)     Time: 0981-1914 PT Time Calculation (min) (ACUTE ONLY): 33 min  Charges:  $Gait Training: 8-22 mins $Therapeutic Exercise: 8-22 mins                     Debe Coder PT Acute Rehabilitation Services Pager [phone] Office [phone]    Kailah Pennel 12/26/2021, 11:50 AM

## 2021-12-27 ENCOUNTER — Other Ambulatory Visit: Payer: Self-pay

## 2021-12-27 DIAGNOSIS — M79604 Pain in right leg: Secondary | ICD-10-CM | POA: Diagnosis not present

## 2021-12-27 DIAGNOSIS — M79605 Pain in left leg: Secondary | ICD-10-CM | POA: Diagnosis not present

## 2021-12-27 DIAGNOSIS — N183 Chronic kidney disease, stage 3 unspecified: Secondary | ICD-10-CM | POA: Diagnosis not present

## 2021-12-27 DIAGNOSIS — M1612 Unilateral primary osteoarthritis, left hip: Secondary | ICD-10-CM | POA: Diagnosis not present

## 2021-12-27 DIAGNOSIS — H9193 Unspecified hearing loss, bilateral: Secondary | ICD-10-CM | POA: Diagnosis not present

## 2021-12-27 DIAGNOSIS — Z7901 Long term (current) use of anticoagulants: Secondary | ICD-10-CM | POA: Diagnosis not present

## 2021-12-27 DIAGNOSIS — Z7982 Long term (current) use of aspirin: Secondary | ICD-10-CM | POA: Diagnosis not present

## 2021-12-27 DIAGNOSIS — N400 Enlarged prostate without lower urinary tract symptoms: Secondary | ICD-10-CM | POA: Diagnosis not present

## 2021-12-27 DIAGNOSIS — I129 Hypertensive chronic kidney disease with stage 1 through stage 4 chronic kidney disease, or unspecified chronic kidney disease: Secondary | ICD-10-CM | POA: Diagnosis not present

## 2021-12-27 DIAGNOSIS — I10 Essential (primary) hypertension: Secondary | ICD-10-CM | POA: Diagnosis not present

## 2021-12-27 DIAGNOSIS — Z96642 Presence of left artificial hip joint: Secondary | ICD-10-CM | POA: Diagnosis not present

## 2021-12-27 DIAGNOSIS — E785 Hyperlipidemia, unspecified: Secondary | ICD-10-CM | POA: Diagnosis not present

## 2021-12-27 DIAGNOSIS — J449 Chronic obstructive pulmonary disease, unspecified: Secondary | ICD-10-CM | POA: Diagnosis not present

## 2021-12-27 DIAGNOSIS — Z471 Aftercare following joint replacement surgery: Secondary | ICD-10-CM | POA: Diagnosis not present

## 2021-12-27 DIAGNOSIS — Z96611 Presence of right artificial shoulder joint: Secondary | ICD-10-CM | POA: Diagnosis not present

## 2021-12-27 DIAGNOSIS — R609 Edema, unspecified: Secondary | ICD-10-CM | POA: Diagnosis not present

## 2021-12-27 DIAGNOSIS — C44300 Unspecified malignant neoplasm of skin of unspecified part of face: Secondary | ICD-10-CM | POA: Diagnosis not present

## 2021-12-27 DIAGNOSIS — I824Z9 Acute embolism and thrombosis of unspecified deep veins of unspecified distal lower extremity: Secondary | ICD-10-CM | POA: Diagnosis not present

## 2021-12-27 DIAGNOSIS — I2510 Atherosclerotic heart disease of native coronary artery without angina pectoris: Secondary | ICD-10-CM | POA: Diagnosis not present

## 2021-12-27 DIAGNOSIS — Z85828 Personal history of other malignant neoplasm of skin: Secondary | ICD-10-CM | POA: Diagnosis not present

## 2021-12-27 DIAGNOSIS — Z8546 Personal history of malignant neoplasm of prostate: Secondary | ICD-10-CM | POA: Diagnosis not present

## 2021-12-27 DIAGNOSIS — Z951 Presence of aortocoronary bypass graft: Secondary | ICD-10-CM | POA: Diagnosis not present

## 2021-12-27 DIAGNOSIS — C61 Malignant neoplasm of prostate: Secondary | ICD-10-CM | POA: Diagnosis not present

## 2021-12-27 DIAGNOSIS — M545 Low back pain, unspecified: Secondary | ICD-10-CM | POA: Diagnosis not present

## 2021-12-27 MED ORDER — HYDROCODONE-ACETAMINOPHEN 5-325 MG PO TABS: 1.0000 | ORAL_TABLET | Freq: Four times a day (QID) | ORAL | 0 refills | Status: DC | PRN

## 2021-12-27 MED ORDER — APIXABAN 2.5 MG PO TABS: 2.5000 mg | ORAL_TABLET | Freq: Two times a day (BID) | ORAL | 0 refills | Status: DC

## 2021-12-27 MED ORDER — TRAMADOL HCL 50 MG PO TABS: 100.0000 mg | ORAL_TABLET | Freq: Four times a day (QID) | ORAL | 0 refills | Status: DC | PRN

## 2021-12-27 NOTE — Progress Notes (Signed)
Physical Therapy Treatment Patient Details Name: Marc Gilbert MRN: [id number] DOB: [birth year] Today's Date: 12/27/2021   History of Present Illness Pt s/p L THR and with hx of CKD, COPD, CAD, R TSR, R THR (2013 by posterior approach), CABG    PT Comments    Pt very motivated and progressing well with mobility this am with reports of marked improvement in pain control.  Pt performed therex program in bed, up for toileting and to ambulate increased distance in hall.  Pt's dtr on phone and pleased with improvement from last day.  Pt eager for dc to rehab setting.  Recommendations for follow up therapy are one component of a multi-disciplinary discharge planning process, led by the attending physician.  Recommendations may be updated based on patient status, additional functional criteria and insurance authorization.  Follow Up Recommendations  Follow physician's recommendations for discharge plan and follow up therapies     Assistance Recommended at Discharge Intermittent Supervision/Assistance  Patient can return home with the following A little help with walking and/or transfers;A little help with bathing/dressing/bathroom;Assistance with cooking/housework;Help with stairs or ramp for entrance;Assist for transportation   Equipment Recommendations  None recommended by PT    Recommendations for Other Services       Precautions / Restrictions Precautions Precautions: Fall Restrictions Weight Bearing Restrictions: No Other Position/Activity Restrictions: WBAT     Mobility  Bed Mobility Overal bed mobility: Needs Assistance Bed Mobility: Supine to Sit     Supine to sit: Min assist     General bed mobility comments: cues for sequence and use of R LE to self assist; physical assist to bring trunk to upright    Transfers Overall transfer level: Needs assistance Equipment used: Rolling walker (2 wheels) Transfers: Sit to/from Stand Sit to Stand: Min assist            General transfer comment: cues for LE management and use of UEs to self assist    Ambulation/Gait Ambulation/Gait assistance: Min guard Gait Distance (Feet): 90 Feet (90' twice) Assistive device: Rolling walker (2 wheels) Gait Pattern/deviations: Decreased step length - right, Decreased step length - left, Shuffle, Trunk flexed, Step-to pattern Gait velocity: decr     General Gait Details: cues for sequence, posture and position from RW; pt progressed to reciprocal gait on secone attempt at ambulation   Stairs             Wheelchair Mobility    Modified Rankin (Stroke Patients Only)       Balance Overall balance assessment: Needs assistance Sitting-balance support: No upper extremity supported, Feet supported Sitting balance-Leahy Scale: Good     Standing balance support: Single extremity supported Standing balance-Leahy Scale: Poor                              Cognition Arousal/Alertness: Awake/alert Behavior During Therapy: WFL for tasks assessed/performed Overall Cognitive Status: Within Functional Limits for tasks assessed                                          Exercises Total Joint Exercises Ankle Circles/Pumps: AROM, Both, 15 reps, Supine Quad Sets: AROM, Both, 10 reps, Supine Heel Slides: AAROM, Left, 20 reps, Supine Hip ABduction/ADduction: AAROM, Left, 15 reps, Supine    General Comments        Pertinent Vitals/Pain Pain Assessment Pain  Assessment: 0-10 Pain Score: 4  Pain Location: L hip and thigh with activity Pain Descriptors / Indicators: Aching, Sore Pain Intervention(s): Limited activity within patient's tolerance, Monitored during session, Premedicated before session, Ice applied    Home Living                          Prior Function            PT Goals (current goals can now be found in the care plan section) Acute Rehab PT Goals Patient Stated Goal: Regain IND PT Goal Formulation:  With patient Time For Goal Achievement: 01/08/22 Potential to Achieve Goals: Good Progress towards PT goals: Progressing toward goals    Frequency    7X/week      PT Plan Current plan remains appropriate    Co-evaluation              AM-PAC PT "6 Clicks" Mobility   Outcome Measure  Help needed turning from your back to your side while in a flat bed without using bedrails?: A Little Help needed moving from lying on your back to sitting on the side of a flat bed without using bedrails?: A Little Help needed moving to and from a bed to a chair (including a wheelchair)?: A Little Help needed standing up from a chair using your arms (e.g., wheelchair or bedside chair)?: A Little Help needed to walk in hospital room?: A Little Help needed climbing 3-5 steps with a railing? : A Lot 6 Click Score: 17    End of Session Equipment Utilized During Treatment: Gait belt Activity Tolerance: Patient tolerated treatment well Patient left: in chair;with call bell/phone within reach;with chair alarm set Nurse Communication: Mobility status PT Visit Diagnosis: Difficulty in walking, not elsewhere classified (R26.2)     Time: 9532-0233 PT Time Calculation (min) (ACUTE ONLY): 43 min  Charges:  $Gait Training: 8-22 mins $Therapeutic Exercise: 8-22 mins $Therapeutic Activity: 8-22 mins                     Debe Coder PT Acute Rehabilitation Services Pager [phone] Office [phone]    Bronte Sabado 12/27/2021, 1:03 PM

## 2021-12-27 NOTE — Plan of Care (Signed)
Patient discharging to [place]. Report called to Fredrik Rigger, Nurse. Patient transporting via private vehicle with Margaretha Glassing. Ivan Anchors, RN 12/27/21 12:02 PM

## 2021-12-27 NOTE — Plan of Care (Signed)
Problem: Education: Goal: Knowledge of General Education information will improve Description: Including pain rating scale, medication(s)/side effects and non-pharmacologic comfort measures Outcome: Progressing   Problem: Clinical Measurements: Goal: Ability to maintain clinical measurements within normal limits will improve Outcome: Progressing   Problem: Activity: Goal: Risk for activity intolerance will decrease Outcome: Progressing   Problem: Pain Managment: Goal: General experience of comfort will improve Outcome: [place], RN 12/27/21 8:14 AM

## 2021-12-27 NOTE — Discharge Summary (Signed)
Patient ID: Marc Gilbert MRN: [id number] DOB/AGE: [birth year] 88 y.o.  Admit date: 12/25/2021 Discharge date: 12/27/2021  Admission Diagnoses:  Principal Problem:   Unilateral primary osteoarthritis, left hip Active Problems:   Status post total replacement of left hip   Discharge Diagnoses:  Same  Past Medical History:  Diagnosis Date   Arthritis    "right hand; back" (08/30/2015)   Cancer (HCC)    skin  squamous and basal   Chronic lower back pain    CKD (chronic kidney disease), stage III (HCC)    Stage III   COPD (chronic obstructive pulmonary disease) (HCC)    no home O2   Coronary artery disease    Dysplastic colon polyp age 89   carcinoma in situ   GERD (gastroesophageal reflux disease)    occ   H/O hiatal hernia    Hyperlipidemia    Hypertension    Prostate cancer ([place]) 07/2014   active surveillance, Glisson 6    Surgeries: Procedure(s): LEFT TOTAL HIP ARTHROPLASTY ANTERIOR APPROACH on 12/25/2021   Consultants:   Discharged Condition: Improved  Hospital Course: YORDY MATTON is an 88 y.o. male who was admitted 12/25/2021 for operative treatment ofUnilateral primary osteoarthritis, left hip. Patient has severe unremitting pain that affects sleep, daily activities, and work/hobbies. After pre-op clearance the patient was taken to the operating room on 12/25/2021 and underwent  Procedure(s): LEFT TOTAL HIP ARTHROPLASTY ANTERIOR APPROACH.    Patient was given perioperative antibiotics:  Anti-infectives (From admission, onward)    Start     Dose/Rate Route Frequency Ordered Stop   12/25/21 1530  ceFAZolin (ANCEF) IVPB 1 g/50 mL premix        1 g 100 mL/hr over 30 Minutes Intravenous Every 6 hours 12/25/21 1356 12/25/21 2158   12/25/21 0745  ceFAZolin (ANCEF) IVPB 2g/100 mL premix        2 g 200 mL/hr over 30 Minutes Intravenous On call to O.R. 12/25/21 0740 12/25/21 0929        Patient was given sequential compression devices, early ambulation, and  chemoprophylaxis to prevent DVT.  Patient benefited maximally from hospital stay and there were no complications.    Recent vital signs: Patient Vitals for the past 24 hrs:  BP Temp Temp src Pulse Resp SpO2  12/27/21 0539 118/62 98.7 F (37.1 C) Oral 77 18 94 %  12/26/21 2152 (!) 110/59 98.9 F (37.2 C) Oral 77 18 94 %  12/26/21 1343 (!) 125/53 99 F (37.2 C) Oral 85 20 90 %  12/26/21 0955 101/61 98 F (36.7 C) Oral 74 20 95 %     Recent laboratory studies:  Recent Labs    12/26/21 0308  WBC 10.4  HGB 11.5*  HCT 35.3*  PLT 171  NA 137  K 4.4  CL 106  CO2 25  BUN 20  CREATININE 1.19  GLUCOSE 136*  CALCIUM 8.1*     Discharge Medications:   Allergies as of 12/27/2021   No Known Allergies      Medication List     TAKE these medications    amoxicillin 500 MG tablet Commonly known as: AMOXIL Take 2,000 mg by mouth See admin instructions. Take prior to dental visits.   apixaban 2.5 MG Tabs tablet Commonly known as: ELIQUIS Take 1 tablet (2.5 mg total) by mouth every 12 (twelve) hours.   aspirin 81 MG tablet Take 81 mg by mouth at bedtime.   atorvastatin 40 MG tablet Commonly known as: LIPITOR Take  1 tablet (40 mg total) by mouth daily. What changed: when to take this   diclofenac Sodium 1 % Gel Commonly known as: VOLTAREN Apply 1 application topically daily.   docusate sodium 250 MG capsule Commonly known as: COLACE Take 250 mg by mouth daily.   FISH OIL OMEGA-3 PO Take 2 capsules by mouth daily. 980 mg per cap   gabapentin 300 MG capsule Commonly known as: NEURONTIN Take 300 mg by mouth 3 (three) times daily.   HYDROcodone-acetaminophen 5-325 MG tablet Commonly known as: NORCO/VICODIN Take 1 tablet by mouth every 6 (six) hours as needed for severe pain.   Melatonin 10 MG Tabs Take 10 mg by mouth at bedtime.   metoprolol succinate 50 MG 24 hr tablet Commonly known as: TOPROL-XL Take 1 tablet (50 mg total) by mouth 2 (two) times daily.    One-A-Day Mens 50+ Advantage Tabs Take 1 tablet by mouth daily with breakfast.   oxybutynin 5 MG tablet Commonly known as: DITROPAN Take 5 mg by mouth at bedtime.   polyethylene glycol 17 g packet Commonly known as: MIRALAX / GLYCOLAX Take 8.5 g by mouth daily in the afternoon.   sodium chloride 0.65 % Soln nasal spray Commonly known as: OCEAN Place 1 spray into both nostrils 4 (four) times daily.   tamsulosin 0.4 MG Caps capsule Commonly known as: FLOMAX Take 0.4 mg by mouth at bedtime.   traMADol 50 MG tablet Commonly known as: ULTRAM Take 2 tablets (100 mg total) by mouth every 6 (six) hours as needed for moderate pain.               Durable Medical Equipment  (From admission, onward)           Start     Ordered   12/25/21 1357  DME 3 n 1  Once        12/25/21 1356   12/25/21 1357  DME Walker rolling  Once       Question Answer Comment  Walker: With 5 Inch Wheels   Patient needs a walker to treat with the following condition Status post total replacement of left hip      12/25/21 1356            Diagnostic Studies: DG Pelvis Portable  Result Date: 12/25/2021 CLINICAL DATA:  Status post left total hip replacement. EXAM: PORTABLE PELVIS 1-2 VIEWS COMPARISON:  May 26, 2011. FINDINGS: Interval placement of left femoral and acetabular components which are well situated. Expected postoperative changes are seen in the surrounding soft tissues. Previously placed right hip arthroplasty is again noted. IMPRESSION: Status post left total hip arthroplasty. Electronically Signed   By: Marijo Conception M.D.   On: 12/25/2021 11:32   DG HIP PORT UNILAT WITH PELVIS 1V LEFT  Result Date: 12/25/2021 CLINICAL DATA:  Left hip total arthroplasty EXAM: DG HIP (WITH OR WITHOUT PELVIS) 1V PORT LEFT; DG C-ARM 1-60 MIN-NO REPORT COMPARISON:  05/27/2021 FLUOROSCOPY: Air kerma 2.59 mGy FINDINGS: Intraoperative fluoroscopic images of the left hip demonstrate total arthroplasty.  Pre-existing right hip total arthroplasty. No evidence of perihardware fracture or component malpositioning. IMPRESSION: Intraoperative fluoroscopic images of the left hip demonstrate total arthroplasty. Pre-existing right hip total arthroplasty. No evidence of perihardware fracture or component malpositioning. Electronically Signed   By: Delanna Ahmadi M.D.   On: 12/25/2021 11:12   DG C-Arm 1-60 Min-No Report  Result Date: 12/25/2021 Fluoroscopy was utilized by the requesting physician.  No radiographic interpretation.    Disposition:  Discharge disposition: 03-Skilled [place]     [name], MD Follow up in 2 week(s).   Specialty: Orthopedic Surgery Contact information: [address] [place] [address] [phone]                  Signed: [name] 12/27/2021, 8:25 AM

## 2021-12-27 NOTE — TOC Transition Note (Signed)
Transition of Care [facility]) - CM/SW Discharge Note   Patient Details  Name: Marc Gilbert MRN: [id number] Date of Birth: [birth year]  Transition [facility] CM/SW Contact:  Vassie Moselle, LCSW Phone Number: 12/27/2021, 9:50 AM   Clinical Narrative:    Pt is to transfer to [facility] and will be going to room 119. RN to call report to [phone]. Per PT pt is able to transport in private vehicle. CSW spoke with pt's daughter to inform of discharge plan. Pt's daughter is agreeable to pick pt up and states she is able to get to the hospital within 5 minutes when RN is ready.    Final next level of care: Skilled Nursing Facility Barriers to Discharge: No Barriers Identified   Patient Goals and CMS Choice Patient states their goals for this hospitalization and ongoing recovery are:: rehab prior [place] return to [place] apt [place] Medicare.gov Compare Post Acute Care list provided to:: Patient Choice offered to / list presented to : Adult Children, Patient  Discharge Placement   Existing PASRR number confirmed : 12/26/21          Patient chooses bed at: [facility] Patient to be transferred to facility by: Daughter Name of family member notified: Margaretha Glassing Patient and family notified of of transfer: 12/27/21  Discharge Plan and Services In-house Referral: Clinical Social Work              DME Arranged: N/A DME Agency: NA                  Social Determinants of Health ([place]) Interventions     Readmission Risk Interventions     No data to display

## 2021-12-27 NOTE — Progress Notes (Signed)
Subjective: 2 Days Post-Op Procedure(s) (LRB): LEFT TOTAL HIP ARTHROPLASTY ANTERIOR APPROACH (Left) Patient reports pain as mild.    Objective: Vital signs in last 24 hours: Temp:  [98 F (36.7 C)-99 F (37.2 C)] 98.7 F (37.1 C) (11/11 0539) Pulse Rate:  [74-85] 77 (11/11 0539) Resp:  [18-20] 18 (11/11 0539) BP: (101-125)/(53-62) 118/62 (11/11 0539) SpO2:  [90 %-95 %] 94 % (11/11 0539)  Intake/Output from previous day: 11/10 0701 - 11/11 0700 In: 2288.1 [P.O.:1680; I.V.:608.1] Out: 610 [Urine:610] Intake/Output this shift: Total I/O In: -  Out: 100 [Urine:100]  Recent Labs    12/26/21 0308  HGB 11.5*   Recent Labs    12/26/21 0308  WBC 10.4  RBC 3.61*  HCT 35.3*  PLT 171   Recent Labs    12/26/21 0308  NA 137  K 4.4  CL 106  CO2 25  BUN 20  CREATININE 1.19  GLUCOSE 136*  CALCIUM 8.1*   No results for input(s): "LABPT", "INR" in the last 72 hours.  Sensation intact distally Intact pulses distally Dorsiflexion/Plantar flexion intact Incision: dressing C/D/I   Assessment/Plan: 2 Days Post-Op Procedure(s) (LRB): LEFT TOTAL HIP ARTHROPLASTY ANTERIOR APPROACH (Left) Up with therapy Discharge to SNF today.      [name] 12/27/2021, 8:22 AM

## 2021-12-29 DIAGNOSIS — M1612 Unilateral primary osteoarthritis, left hip: Secondary | ICD-10-CM | POA: Diagnosis not present

## 2021-12-29 DIAGNOSIS — N400 Enlarged prostate without lower urinary tract symptoms: Secondary | ICD-10-CM | POA: Diagnosis not present

## 2021-12-29 DIAGNOSIS — Z96642 Presence of left artificial hip joint: Secondary | ICD-10-CM | POA: Diagnosis not present

## 2021-12-29 DIAGNOSIS — I824Z9 Acute embolism and thrombosis of unspecified deep veins of unspecified distal lower extremity: Secondary | ICD-10-CM | POA: Diagnosis not present

## 2021-12-29 DIAGNOSIS — Z471 Aftercare following joint replacement surgery: Secondary | ICD-10-CM | POA: Diagnosis not present

## 2021-12-29 DIAGNOSIS — Z951 Presence of aortocoronary bypass graft: Secondary | ICD-10-CM | POA: Diagnosis not present

## 2021-12-29 DIAGNOSIS — I2510 Atherosclerotic heart disease of native coronary artery without angina pectoris: Secondary | ICD-10-CM | POA: Diagnosis not present

## 2021-12-29 DIAGNOSIS — I10 Essential (primary) hypertension: Secondary | ICD-10-CM | POA: Diagnosis not present

## 2021-12-31 DIAGNOSIS — Z471 Aftercare following joint replacement surgery: Secondary | ICD-10-CM | POA: Diagnosis not present

## 2021-12-31 DIAGNOSIS — M79604 Pain in right leg: Secondary | ICD-10-CM | POA: Diagnosis not present

## 2021-12-31 DIAGNOSIS — M79605 Pain in left leg: Secondary | ICD-10-CM | POA: Diagnosis not present

## 2021-12-31 DIAGNOSIS — Z96642 Presence of left artificial hip joint: Secondary | ICD-10-CM | POA: Diagnosis not present

## 2021-12-31 DIAGNOSIS — R609 Edema, unspecified: Secondary | ICD-10-CM | POA: Diagnosis not present

## 2022-01-01 ENCOUNTER — Encounter: Payer: Self-pay | Admitting: Orthopaedic Surgery

## 2022-01-02 ENCOUNTER — Telehealth: Payer: Self-pay | Admitting: *Deleted

## 2022-01-02 NOTE — Telephone Encounter (Signed)
Ortho bundle call completed. 

## 2022-01-07 ENCOUNTER — Telehealth: Payer: Self-pay | Admitting: *Deleted

## 2022-01-07 ENCOUNTER — Ambulatory Visit (INDEPENDENT_AMBULATORY_CARE_PROVIDER_SITE_OTHER): Payer: Medicare Other | Admitting: Orthopaedic Surgery

## 2022-01-07 DIAGNOSIS — Z96642 Presence of left artificial hip joint: Secondary | ICD-10-CM

## 2022-01-07 NOTE — Progress Notes (Signed)
The patient is here for his first visit status post a left total hip arthroplasty.  His daughter who is a physician is with him.  He has been dealing with significant peripheral edema.  He has had some fluctuation in his weight and his daughter said they have been working with getting some of the fluid off the Lasix.  He was assessed for DVT with ultrasound and this was negative.  He is making progress with mobility as a relates to his hip replacement.  His left hip incision looks good.  The staples and are removed and Steri-Strips applied.  His leg lengths are equal.  He tolerates me putting his left hip through internal and external rotation.  He is on Eliquis chronically.  He will continue to therapy where he is staying.  I will see him in 4 weeks to see how he is doing overall but no x-rays are needed unless there are any issues.  All question concerns were addressed and answered.

## 2022-01-07 NOTE — Telephone Encounter (Signed)
Ortho bundle 14 day appointment completed.

## 2022-01-13 ENCOUNTER — Encounter: Payer: Self-pay | Admitting: Orthopaedic Surgery

## 2022-01-14 DIAGNOSIS — R262 Difficulty in walking, not elsewhere classified: Secondary | ICD-10-CM | POA: Diagnosis not present

## 2022-01-14 DIAGNOSIS — M6281 Muscle weakness (generalized): Secondary | ICD-10-CM | POA: Diagnosis not present

## 2022-01-15 DIAGNOSIS — M6281 Muscle weakness (generalized): Secondary | ICD-10-CM | POA: Diagnosis not present

## 2022-01-15 DIAGNOSIS — R262 Difficulty in walking, not elsewhere classified: Secondary | ICD-10-CM | POA: Diagnosis not present

## 2022-01-16 DIAGNOSIS — M6281 Muscle weakness (generalized): Secondary | ICD-10-CM | POA: Diagnosis not present

## 2022-01-16 DIAGNOSIS — R262 Difficulty in walking, not elsewhere classified: Secondary | ICD-10-CM | POA: Diagnosis not present

## 2022-01-19 DIAGNOSIS — R262 Difficulty in walking, not elsewhere classified: Secondary | ICD-10-CM | POA: Diagnosis not present

## 2022-01-19 DIAGNOSIS — M6281 Muscle weakness (generalized): Secondary | ICD-10-CM | POA: Diagnosis not present

## 2022-01-20 DIAGNOSIS — R609 Edema, unspecified: Secondary | ICD-10-CM | POA: Diagnosis not present

## 2022-01-20 DIAGNOSIS — Z96642 Presence of left artificial hip joint: Secondary | ICD-10-CM | POA: Diagnosis not present

## 2022-01-20 DIAGNOSIS — M1990 Unspecified osteoarthritis, unspecified site: Secondary | ICD-10-CM | POA: Diagnosis not present

## 2022-01-21 DIAGNOSIS — M6281 Muscle weakness (generalized): Secondary | ICD-10-CM | POA: Diagnosis not present

## 2022-01-21 DIAGNOSIS — R262 Difficulty in walking, not elsewhere classified: Secondary | ICD-10-CM | POA: Diagnosis not present

## 2022-01-23 DIAGNOSIS — R262 Difficulty in walking, not elsewhere classified: Secondary | ICD-10-CM | POA: Diagnosis not present

## 2022-01-23 DIAGNOSIS — M6281 Muscle weakness (generalized): Secondary | ICD-10-CM | POA: Diagnosis not present

## 2022-01-26 DIAGNOSIS — M6281 Muscle weakness (generalized): Secondary | ICD-10-CM | POA: Diagnosis not present

## 2022-01-26 DIAGNOSIS — R262 Difficulty in walking, not elsewhere classified: Secondary | ICD-10-CM | POA: Diagnosis not present

## 2022-01-28 DIAGNOSIS — M6281 Muscle weakness (generalized): Secondary | ICD-10-CM | POA: Diagnosis not present

## 2022-01-28 DIAGNOSIS — R262 Difficulty in walking, not elsewhere classified: Secondary | ICD-10-CM | POA: Diagnosis not present

## 2022-01-30 DIAGNOSIS — R262 Difficulty in walking, not elsewhere classified: Secondary | ICD-10-CM | POA: Diagnosis not present

## 2022-01-30 DIAGNOSIS — M6281 Muscle weakness (generalized): Secondary | ICD-10-CM | POA: Diagnosis not present

## 2022-02-02 DIAGNOSIS — R262 Difficulty in walking, not elsewhere classified: Secondary | ICD-10-CM | POA: Diagnosis not present

## 2022-02-02 DIAGNOSIS — M6281 Muscle weakness (generalized): Secondary | ICD-10-CM | POA: Diagnosis not present

## 2022-02-04 DIAGNOSIS — M6281 Muscle weakness (generalized): Secondary | ICD-10-CM | POA: Diagnosis not present

## 2022-02-04 DIAGNOSIS — R262 Difficulty in walking, not elsewhere classified: Secondary | ICD-10-CM | POA: Diagnosis not present

## 2022-02-05 DIAGNOSIS — L905 Scar conditions and fibrosis of skin: Secondary | ICD-10-CM | POA: Diagnosis not present

## 2022-02-05 DIAGNOSIS — I890 Lymphedema, not elsewhere classified: Secondary | ICD-10-CM | POA: Diagnosis not present

## 2022-02-06 DIAGNOSIS — R262 Difficulty in walking, not elsewhere classified: Secondary | ICD-10-CM | POA: Diagnosis not present

## 2022-02-06 DIAGNOSIS — M6281 Muscle weakness (generalized): Secondary | ICD-10-CM | POA: Diagnosis not present

## 2022-02-11 DIAGNOSIS — M6281 Muscle weakness (generalized): Secondary | ICD-10-CM | POA: Diagnosis not present

## 2022-02-11 DIAGNOSIS — R262 Difficulty in walking, not elsewhere classified: Secondary | ICD-10-CM | POA: Diagnosis not present

## 2022-02-12 ENCOUNTER — Ambulatory Visit (INDEPENDENT_AMBULATORY_CARE_PROVIDER_SITE_OTHER): Payer: Medicare Other | Admitting: Orthopaedic Surgery

## 2022-02-12 ENCOUNTER — Telehealth: Payer: Self-pay | Admitting: *Deleted

## 2022-02-12 ENCOUNTER — Encounter: Payer: Self-pay | Admitting: Orthopaedic Surgery

## 2022-02-12 DIAGNOSIS — Z23 Encounter for immunization: Secondary | ICD-10-CM | POA: Diagnosis not present

## 2022-02-12 DIAGNOSIS — Z96642 Presence of left artificial hip joint: Secondary | ICD-10-CM

## 2022-02-12 NOTE — Telephone Encounter (Signed)
Ortho bundle f/u completed.

## 2022-02-12 NOTE — Progress Notes (Signed)
The patient is now 7 weeks status post a left total hip arthroplasty.  He is will ambulate with a rolling walker.  He is a very active 88 year old gentleman and said he is doing great and having some ankle swelling but overall is doing great.  He would like to resume salt water baths if that is fine with [place].  His hip incision looks great.  I am fine with him starting salt water baths.  There is no calf pain or foot and ankle swelling today other than some swelling near where he had previous Mohs surgery at the anterior shin.  Overall his mobility is excellent with his left hip in terms of rotation and flexion.  For my standpoint, the next time I need to see him is not for 6 months unless he is having issues.  Will have an AP pelvis and lateral of his left more recent operative hip and the standpoint.

## 2022-02-13 DIAGNOSIS — R262 Difficulty in walking, not elsewhere classified: Secondary | ICD-10-CM | POA: Diagnosis not present

## 2022-02-13 DIAGNOSIS — M6281 Muscle weakness (generalized): Secondary | ICD-10-CM | POA: Diagnosis not present

## 2022-02-18 DIAGNOSIS — R262 Difficulty in walking, not elsewhere classified: Secondary | ICD-10-CM | POA: Diagnosis not present

## 2022-02-18 DIAGNOSIS — M6281 Muscle weakness (generalized): Secondary | ICD-10-CM | POA: Diagnosis not present

## 2022-02-19 DIAGNOSIS — Z86018 Personal history of other benign neoplasm: Secondary | ICD-10-CM | POA: Diagnosis not present

## 2022-02-19 DIAGNOSIS — L570 Actinic keratosis: Secondary | ICD-10-CM | POA: Diagnosis not present

## 2022-02-19 DIAGNOSIS — L578 Other skin changes due to chronic exposure to nonionizing radiation: Secondary | ICD-10-CM | POA: Diagnosis not present

## 2022-02-19 DIAGNOSIS — D225 Melanocytic nevi of trunk: Secondary | ICD-10-CM | POA: Diagnosis not present

## 2022-02-19 DIAGNOSIS — L821 Other seborrheic keratosis: Secondary | ICD-10-CM | POA: Diagnosis not present

## 2022-02-19 DIAGNOSIS — Z85828 Personal history of other malignant neoplasm of skin: Secondary | ICD-10-CM | POA: Diagnosis not present

## 2022-02-20 DIAGNOSIS — M6281 Muscle weakness (generalized): Secondary | ICD-10-CM | POA: Diagnosis not present

## 2022-02-20 DIAGNOSIS — R262 Difficulty in walking, not elsewhere classified: Secondary | ICD-10-CM | POA: Diagnosis not present

## 2022-02-23 DIAGNOSIS — R262 Difficulty in walking, not elsewhere classified: Secondary | ICD-10-CM | POA: Diagnosis not present

## 2022-02-23 DIAGNOSIS — M6281 Muscle weakness (generalized): Secondary | ICD-10-CM | POA: Diagnosis not present

## 2022-02-25 DIAGNOSIS — R262 Difficulty in walking, not elsewhere classified: Secondary | ICD-10-CM | POA: Diagnosis not present

## 2022-02-25 DIAGNOSIS — M6281 Muscle weakness (generalized): Secondary | ICD-10-CM | POA: Diagnosis not present

## 2022-02-27 DIAGNOSIS — R262 Difficulty in walking, not elsewhere classified: Secondary | ICD-10-CM | POA: Diagnosis not present

## 2022-02-27 DIAGNOSIS — M6281 Muscle weakness (generalized): Secondary | ICD-10-CM | POA: Diagnosis not present

## 2022-03-02 DIAGNOSIS — R262 Difficulty in walking, not elsewhere classified: Secondary | ICD-10-CM | POA: Diagnosis not present

## 2022-03-02 DIAGNOSIS — M6281 Muscle weakness (generalized): Secondary | ICD-10-CM | POA: Diagnosis not present

## 2022-03-04 DIAGNOSIS — R262 Difficulty in walking, not elsewhere classified: Secondary | ICD-10-CM | POA: Diagnosis not present

## 2022-03-04 DIAGNOSIS — M6281 Muscle weakness (generalized): Secondary | ICD-10-CM | POA: Diagnosis not present

## 2022-03-06 DIAGNOSIS — M6281 Muscle weakness (generalized): Secondary | ICD-10-CM | POA: Diagnosis not present

## 2022-03-06 DIAGNOSIS — R262 Difficulty in walking, not elsewhere classified: Secondary | ICD-10-CM | POA: Diagnosis not present

## 2022-03-09 DIAGNOSIS — M6281 Muscle weakness (generalized): Secondary | ICD-10-CM | POA: Diagnosis not present

## 2022-03-09 DIAGNOSIS — M79675 Pain in left toe(s): Secondary | ICD-10-CM | POA: Diagnosis not present

## 2022-03-09 DIAGNOSIS — B351 Tinea unguium: Secondary | ICD-10-CM | POA: Diagnosis not present

## 2022-03-09 DIAGNOSIS — M79674 Pain in right toe(s): Secondary | ICD-10-CM | POA: Diagnosis not present

## 2022-03-09 DIAGNOSIS — R262 Difficulty in walking, not elsewhere classified: Secondary | ICD-10-CM | POA: Diagnosis not present

## 2022-03-11 DIAGNOSIS — M6281 Muscle weakness (generalized): Secondary | ICD-10-CM | POA: Diagnosis not present

## 2022-03-11 DIAGNOSIS — R262 Difficulty in walking, not elsewhere classified: Secondary | ICD-10-CM | POA: Diagnosis not present

## 2022-03-16 DIAGNOSIS — M6281 Muscle weakness (generalized): Secondary | ICD-10-CM | POA: Diagnosis not present

## 2022-03-16 DIAGNOSIS — R262 Difficulty in walking, not elsewhere classified: Secondary | ICD-10-CM | POA: Diagnosis not present

## 2022-03-18 DIAGNOSIS — M6281 Muscle weakness (generalized): Secondary | ICD-10-CM | POA: Diagnosis not present

## 2022-03-18 DIAGNOSIS — R262 Difficulty in walking, not elsewhere classified: Secondary | ICD-10-CM | POA: Diagnosis not present

## 2022-03-20 DIAGNOSIS — M6281 Muscle weakness (generalized): Secondary | ICD-10-CM | POA: Diagnosis not present

## 2022-03-20 DIAGNOSIS — R262 Difficulty in walking, not elsewhere classified: Secondary | ICD-10-CM | POA: Diagnosis not present

## 2022-03-23 DIAGNOSIS — M6281 Muscle weakness (generalized): Secondary | ICD-10-CM | POA: Diagnosis not present

## 2022-03-23 DIAGNOSIS — R262 Difficulty in walking, not elsewhere classified: Secondary | ICD-10-CM | POA: Diagnosis not present

## 2022-03-25 DIAGNOSIS — M6281 Muscle weakness (generalized): Secondary | ICD-10-CM | POA: Diagnosis not present

## 2022-03-25 DIAGNOSIS — R262 Difficulty in walking, not elsewhere classified: Secondary | ICD-10-CM | POA: Diagnosis not present

## 2022-03-27 DIAGNOSIS — R262 Difficulty in walking, not elsewhere classified: Secondary | ICD-10-CM | POA: Diagnosis not present

## 2022-03-27 DIAGNOSIS — M6281 Muscle weakness (generalized): Secondary | ICD-10-CM | POA: Diagnosis not present

## 2022-03-30 DIAGNOSIS — M6281 Muscle weakness (generalized): Secondary | ICD-10-CM | POA: Diagnosis not present

## 2022-03-30 DIAGNOSIS — R262 Difficulty in walking, not elsewhere classified: Secondary | ICD-10-CM | POA: Diagnosis not present

## 2022-04-01 ENCOUNTER — Telehealth: Payer: Self-pay | Admitting: *Deleted

## 2022-04-01 DIAGNOSIS — M6281 Muscle weakness (generalized): Secondary | ICD-10-CM | POA: Diagnosis not present

## 2022-04-01 DIAGNOSIS — R262 Difficulty in walking, not elsewhere classified: Secondary | ICD-10-CM | POA: Diagnosis not present

## 2022-04-01 NOTE — Telephone Encounter (Signed)
Ortho bundle 90 day call completed. 

## 2022-04-03 DIAGNOSIS — M6281 Muscle weakness (generalized): Secondary | ICD-10-CM | POA: Diagnosis not present

## 2022-04-03 DIAGNOSIS — R262 Difficulty in walking, not elsewhere classified: Secondary | ICD-10-CM | POA: Diagnosis not present

## 2022-04-06 DIAGNOSIS — M6281 Muscle weakness (generalized): Secondary | ICD-10-CM | POA: Diagnosis not present

## 2022-04-06 DIAGNOSIS — R262 Difficulty in walking, not elsewhere classified: Secondary | ICD-10-CM | POA: Diagnosis not present

## 2022-04-08 DIAGNOSIS — M6281 Muscle weakness (generalized): Secondary | ICD-10-CM | POA: Diagnosis not present

## 2022-04-08 DIAGNOSIS — R262 Difficulty in walking, not elsewhere classified: Secondary | ICD-10-CM | POA: Diagnosis not present

## 2022-04-10 DIAGNOSIS — M6281 Muscle weakness (generalized): Secondary | ICD-10-CM | POA: Diagnosis not present

## 2022-04-10 DIAGNOSIS — R262 Difficulty in walking, not elsewhere classified: Secondary | ICD-10-CM | POA: Diagnosis not present

## 2022-04-13 DIAGNOSIS — R262 Difficulty in walking, not elsewhere classified: Secondary | ICD-10-CM | POA: Diagnosis not present

## 2022-04-13 DIAGNOSIS — M6281 Muscle weakness (generalized): Secondary | ICD-10-CM | POA: Diagnosis not present

## 2022-04-15 DIAGNOSIS — M6281 Muscle weakness (generalized): Secondary | ICD-10-CM | POA: Diagnosis not present

## 2022-04-15 DIAGNOSIS — R262 Difficulty in walking, not elsewhere classified: Secondary | ICD-10-CM | POA: Diagnosis not present

## 2022-04-17 DIAGNOSIS — R262 Difficulty in walking, not elsewhere classified: Secondary | ICD-10-CM | POA: Diagnosis not present

## 2022-04-17 DIAGNOSIS — M6281 Muscle weakness (generalized): Secondary | ICD-10-CM | POA: Diagnosis not present

## 2022-04-20 DIAGNOSIS — H52203 Unspecified astigmatism, bilateral: Secondary | ICD-10-CM | POA: Diagnosis not present

## 2022-04-20 DIAGNOSIS — Z961 Presence of intraocular lens: Secondary | ICD-10-CM | POA: Diagnosis not present

## 2022-04-20 DIAGNOSIS — R262 Difficulty in walking, not elsewhere classified: Secondary | ICD-10-CM | POA: Diagnosis not present

## 2022-04-20 DIAGNOSIS — M6281 Muscle weakness (generalized): Secondary | ICD-10-CM | POA: Diagnosis not present

## 2022-04-20 DIAGNOSIS — H35372 Puckering of macula, left eye: Secondary | ICD-10-CM | POA: Diagnosis not present

## 2022-04-22 DIAGNOSIS — M6281 Muscle weakness (generalized): Secondary | ICD-10-CM | POA: Diagnosis not present

## 2022-04-22 DIAGNOSIS — R262 Difficulty in walking, not elsewhere classified: Secondary | ICD-10-CM | POA: Diagnosis not present

## 2022-04-23 ENCOUNTER — Encounter: Payer: Self-pay | Admitting: Radiology

## 2022-04-24 DIAGNOSIS — M6281 Muscle weakness (generalized): Secondary | ICD-10-CM | POA: Diagnosis not present

## 2022-04-24 DIAGNOSIS — R262 Difficulty in walking, not elsewhere classified: Secondary | ICD-10-CM | POA: Diagnosis not present

## 2022-04-27 DIAGNOSIS — R262 Difficulty in walking, not elsewhere classified: Secondary | ICD-10-CM | POA: Diagnosis not present

## 2022-04-27 DIAGNOSIS — M6281 Muscle weakness (generalized): Secondary | ICD-10-CM | POA: Diagnosis not present

## 2022-04-29 ENCOUNTER — Ambulatory Visit (INDEPENDENT_AMBULATORY_CARE_PROVIDER_SITE_OTHER): Payer: Medicare Other | Admitting: Plastic Surgery

## 2022-04-29 ENCOUNTER — Encounter: Payer: Self-pay | Admitting: Plastic Surgery

## 2022-04-29 VITALS — BP 128/77 | HR 63 | Ht 72.0 in | Wt 217.8 lb

## 2022-04-29 DIAGNOSIS — H02834 Dermatochalasis of left upper eyelid: Secondary | ICD-10-CM

## 2022-04-29 DIAGNOSIS — H02831 Dermatochalasis of right upper eyelid: Secondary | ICD-10-CM | POA: Diagnosis not present

## 2022-04-29 DIAGNOSIS — R262 Difficulty in walking, not elsewhere classified: Secondary | ICD-10-CM | POA: Diagnosis not present

## 2022-04-29 DIAGNOSIS — M6281 Muscle weakness (generalized): Secondary | ICD-10-CM | POA: Diagnosis not present

## 2022-04-30 ENCOUNTER — Encounter: Payer: Self-pay | Admitting: Plastic Surgery

## 2022-04-30 DIAGNOSIS — H02839 Dermatochalasis of unspecified eye, unspecified eyelid: Secondary | ICD-10-CM | POA: Insufficient documentation

## 2022-04-30 NOTE — Progress Notes (Signed)
Patient ID: KALO DAHLKE, male    DOB: 07/26/1930, 89 y.o.   MRN: [id number]   Chief Complaint  Patient presents with   Advice Only   Skin Problem    The patient is an 89 yrs old male here for evaluation of his eyelids.  The patient states that he has noticed an increase in difficulty seeing his full field over the past several years.  His son in law had his eyes done and said it really helped.  He has multiple medical problems as listed below.  He has occasional dry eyes.  He has an eye doctor, Dr. Prudencio Burly on [address] [phone].  He had cataract surgery in the past and is doing very well.  He has dramatic excess skin of the upper lids with fat herniation medially. He does not seem to have ptosis of the upper lids.    Review of Systems  Constitutional: Negative.   HENT: Negative.    Eyes: Negative.   Respiratory: Negative.  Negative for chest tightness.   Genitourinary: Negative.   Musculoskeletal:  Positive for gait problem.    Past Medical History:  Diagnosis Date   Arthritis    "right hand; back" (08/30/2015)   Cancer (HCC)    skin  squamous and basal   Chronic lower back pain    CKD (chronic kidney disease), stage III (HCC)    Stage III   COPD (chronic obstructive pulmonary disease) (HCC)    no home O2   Coronary artery disease    Dysplastic colon polyp age 38   carcinoma in situ   GERD (gastroesophageal reflux disease)    occ   H/O hiatal hernia    Hyperlipidemia    Hypertension    Prostate cancer ([place]) 07/2014   active surveillance, Glisson 6    Past Surgical History:  Procedure Laterality Date   CARDIAC CATHETERIZATION     CATARACT EXTRACTION W/ INTRAOCULAR LENS IMPLANT Left 08/27/2015   CORONARY ARTERY BYPASS GRAFT N/A 08/14/2016   Procedure: CORONARY ARTERY BYPASS GRAFTING (CABG)x2, using left internal mammary artery and right greater saphenous vein harvested endoscopically;  Surgeon: Gaye Pollack, MD;  Location: [place];  Service: Open Heart  Surgery;  Laterality: N/A;   JOINT REPLACEMENT     LEFT HEART CATH AND CORONARY ANGIOGRAPHY N/A 08/11/2016   Procedure: Left Heart Cath and Coronary Angiography;  Surgeon: Belva Crome, MD;  Location: [place] [facility];  Service: Cardiovascular;  Laterality: N/A;   MOHS SURGERY     multiple SCC   PROSTATE BIOPSY  07/18/2014   TEE WITHOUT CARDIOVERSION N/A 08/14/2016   Procedure: TRANSESOPHAGEAL ECHOCARDIOGRAM (TEE);  Surgeon: Gaye Pollack, MD;  Location: [place];  Service: Open Heart Surgery;  Laterality: N/A;   TONSILLECTOMY     TOTAL HIP ARTHROPLASTY  05/26/2011   Procedure: TOTAL HIP ARTHROPLASTY;  Surgeon: Garald Balding, MD;  Location: [place];  Service: Orthopedics;  Laterality: Right;   TOTAL HIP ARTHROPLASTY Left 12/25/2021   Procedure: LEFT TOTAL HIP ARTHROPLASTY ANTERIOR APPROACH;  Surgeon: [name], MD;  Location: [facility];  Service: Orthopedics;  Laterality: Left;   TOTAL SHOULDER ARTHROPLASTY Right 06/18/2020   Procedure: TOTAL SHOULDER ARTHROPLASTY;  Surgeon: Marchia Bond, MD;  Location: [facility];  Service: Orthopedics;  Laterality: Right;   ULTRASOUND GUIDANCE FOR VASCULAR ACCESS  08/11/2016   Procedure: Ultrasound Guidance For Vascular Access;  Surgeon: Belva Crome, MD;  Location: [place] [facility];  Service:  Cardiovascular;;      Current Outpatient Medications:    amoxicillin (AMOXIL) 500 MG tablet, Take 2,000 mg by mouth See admin instructions. Take prior to dental visits., Disp: , Rfl:    aspirin 81 MG tablet, Take 81 mg by mouth at bedtime., Disp: , Rfl:    atorvastatin (LIPITOR) 40 MG tablet, Take 1 tablet (40 mg total) by mouth daily. (Patient taking differently: Take 40 mg by mouth at bedtime.), Disp: 90 tablet, Rfl: 3   diclofenac Sodium (VOLTAREN) 1 % GEL, Apply 1 application topically daily., Disp: , Rfl:    docusate sodium (COLACE) 250 MG capsule, Take 250 mg by mouth daily., Disp: , Rfl:    gabapentin (NEURONTIN) 300 MG capsule, 2 capsules  Orally three times a day for 90 days, Disp: , Rfl:    Melatonin 10 MG TABS, Take 10 mg by mouth at bedtime., Disp: , Rfl:    metoprolol succinate (TOPROL-XL) 50 MG 24 hr tablet, Take 1 tablet (50 mg total) by mouth 2 (two) times daily., Disp: 180 tablet, Rfl: 3   Multiple Vitamins-Minerals (ONE-A-DAY MENS 50+ ADVANTAGE) TABS, Take 1 tablet by mouth daily with breakfast., Disp: , Rfl:    Omega-3 Fatty Acids (FISH OIL OMEGA-3 PO), Take 2 capsules by mouth daily. 980 mg per cap, Disp: , Rfl:    oxybutynin (DITROPAN) 5 MG tablet, Take 5 mg by mouth at bedtime., Disp: , Rfl:    polyethylene glycol (MIRALAX / GLYCOLAX) 17 g packet, Take 8.5 g by mouth daily in the afternoon., Disp: , Rfl:    sodium chloride (OCEAN) 0.65 % SOLN nasal spray, Place 1 spray into both nostrils 4 (four) times daily., Disp: , Rfl:    Tamsulosin HCl (FLOMAX) 0.4 MG CAPS, Take 0.4 mg by mouth at bedtime., Disp: , Rfl:    Objective:   Vitals:   04/29/22 1501  BP: 128/77  Pulse: 63  SpO2: 96%    Physical Exam Vitals and nursing note reviewed.  HENT:     Head: Atraumatic.  Cardiovascular:     Rate and Rhythm: Normal rate.     Pulses: Normal pulses.  Pulmonary:     Effort: Pulmonary effort is normal.  Musculoskeletal:        General: No swelling or deformity.  Skin:    General: Skin is warm.     Capillary Refill: Capillary refill takes less than 2 seconds.     Coloration: Skin is not jaundiced.     Findings: No bruising.  Neurological:     Mental Status: He is alert and oriented to person, place, and time.  Psychiatric:        Mood and Affect: Mood normal.        Behavior: Behavior normal.        Thought Content: Thought content normal.        Judgment: Judgment normal.     Assessment & Plan:  Dermatochalasis of both upper eyelids  Patient is a good candidate for bilateral upper lid blepharoplasty.  I will send him for a visual field exam.  Pictures were obtained of the patient and placed in the chart  with the patient's or guardian's permission.  [place], [place], DO

## 2022-05-01 DIAGNOSIS — R262 Difficulty in walking, not elsewhere classified: Secondary | ICD-10-CM | POA: Diagnosis not present

## 2022-05-01 DIAGNOSIS — M6281 Muscle weakness (generalized): Secondary | ICD-10-CM | POA: Diagnosis not present

## 2022-05-04 DIAGNOSIS — R262 Difficulty in walking, not elsewhere classified: Secondary | ICD-10-CM | POA: Diagnosis not present

## 2022-05-04 DIAGNOSIS — M6281 Muscle weakness (generalized): Secondary | ICD-10-CM | POA: Diagnosis not present

## 2022-05-13 DIAGNOSIS — R262 Difficulty in walking, not elsewhere classified: Secondary | ICD-10-CM | POA: Diagnosis not present

## 2022-05-13 DIAGNOSIS — M6281 Muscle weakness (generalized): Secondary | ICD-10-CM | POA: Diagnosis not present

## 2022-05-15 DIAGNOSIS — M6281 Muscle weakness (generalized): Secondary | ICD-10-CM | POA: Diagnosis not present

## 2022-05-15 DIAGNOSIS — R262 Difficulty in walking, not elsewhere classified: Secondary | ICD-10-CM | POA: Diagnosis not present

## 2022-05-18 DIAGNOSIS — R262 Difficulty in walking, not elsewhere classified: Secondary | ICD-10-CM | POA: Diagnosis not present

## 2022-05-18 DIAGNOSIS — M6281 Muscle weakness (generalized): Secondary | ICD-10-CM | POA: Diagnosis not present

## 2022-05-20 DIAGNOSIS — M6281 Muscle weakness (generalized): Secondary | ICD-10-CM | POA: Diagnosis not present

## 2022-05-20 DIAGNOSIS — R262 Difficulty in walking, not elsewhere classified: Secondary | ICD-10-CM | POA: Diagnosis not present

## 2022-05-22 DIAGNOSIS — R262 Difficulty in walking, not elsewhere classified: Secondary | ICD-10-CM | POA: Diagnosis not present

## 2022-05-22 DIAGNOSIS — M6281 Muscle weakness (generalized): Secondary | ICD-10-CM | POA: Diagnosis not present

## 2022-05-25 DIAGNOSIS — R262 Difficulty in walking, not elsewhere classified: Secondary | ICD-10-CM | POA: Diagnosis not present

## 2022-05-25 DIAGNOSIS — M6281 Muscle weakness (generalized): Secondary | ICD-10-CM | POA: Diagnosis not present

## 2022-05-26 DIAGNOSIS — M79674 Pain in right toe(s): Secondary | ICD-10-CM | POA: Diagnosis not present

## 2022-05-26 DIAGNOSIS — M79675 Pain in left toe(s): Secondary | ICD-10-CM | POA: Diagnosis not present

## 2022-05-26 DIAGNOSIS — B351 Tinea unguium: Secondary | ICD-10-CM | POA: Diagnosis not present

## 2022-05-27 DIAGNOSIS — R262 Difficulty in walking, not elsewhere classified: Secondary | ICD-10-CM | POA: Diagnosis not present

## 2022-05-27 DIAGNOSIS — M6281 Muscle weakness (generalized): Secondary | ICD-10-CM | POA: Diagnosis not present

## 2022-05-29 DIAGNOSIS — M6281 Muscle weakness (generalized): Secondary | ICD-10-CM | POA: Diagnosis not present

## 2022-05-29 DIAGNOSIS — R262 Difficulty in walking, not elsewhere classified: Secondary | ICD-10-CM | POA: Diagnosis not present

## 2022-06-01 DIAGNOSIS — R262 Difficulty in walking, not elsewhere classified: Secondary | ICD-10-CM | POA: Diagnosis not present

## 2022-06-01 DIAGNOSIS — M6281 Muscle weakness (generalized): Secondary | ICD-10-CM | POA: Diagnosis not present

## 2022-06-03 DIAGNOSIS — M6281 Muscle weakness (generalized): Secondary | ICD-10-CM | POA: Diagnosis not present

## 2022-06-03 DIAGNOSIS — C61 Malignant neoplasm of prostate: Secondary | ICD-10-CM | POA: Diagnosis not present

## 2022-06-03 DIAGNOSIS — R262 Difficulty in walking, not elsewhere classified: Secondary | ICD-10-CM | POA: Diagnosis not present

## 2022-06-05 DIAGNOSIS — M6281 Muscle weakness (generalized): Secondary | ICD-10-CM | POA: Diagnosis not present

## 2022-06-05 DIAGNOSIS — R262 Difficulty in walking, not elsewhere classified: Secondary | ICD-10-CM | POA: Diagnosis not present

## 2022-06-06 ENCOUNTER — Encounter: Payer: Self-pay | Admitting: Cardiology

## 2022-06-06 DIAGNOSIS — I4891 Unspecified atrial fibrillation: Secondary | ICD-10-CM

## 2022-06-08 ENCOUNTER — Telehealth: Payer: Self-pay | Admitting: Cardiology

## 2022-06-08 ENCOUNTER — Ambulatory Visit: Payer: Medicare Other | Attending: Cardiology

## 2022-06-08 DIAGNOSIS — M6281 Muscle weakness (generalized): Secondary | ICD-10-CM | POA: Diagnosis not present

## 2022-06-08 DIAGNOSIS — I4891 Unspecified atrial fibrillation: Secondary | ICD-10-CM

## 2022-06-08 DIAGNOSIS — R262 Difficulty in walking, not elsewhere classified: Secondary | ICD-10-CM | POA: Diagnosis not present

## 2022-06-08 NOTE — Telephone Encounter (Signed)
Patient's wife states she is returning a call regarding heart monitor appointment.

## 2022-06-08 NOTE — Progress Notes (Unsigned)
Enrolled patient for a 3 day Zio XT monitor to be mailed to patients home  

## 2022-06-08 NOTE — Telephone Encounter (Signed)
Called patient, he was confused when he seen the order for the heart monitor. However, I explained and patient verbalized understanding. No questions at this time.

## 2022-06-10 DIAGNOSIS — R351 Nocturia: Secondary | ICD-10-CM | POA: Diagnosis not present

## 2022-06-10 DIAGNOSIS — E349 Endocrine disorder, unspecified: Secondary | ICD-10-CM | POA: Diagnosis not present

## 2022-06-10 DIAGNOSIS — I4891 Unspecified atrial fibrillation: Secondary | ICD-10-CM | POA: Diagnosis not present

## 2022-06-10 DIAGNOSIS — N5201 Erectile dysfunction due to arterial insufficiency: Secondary | ICD-10-CM | POA: Diagnosis not present

## 2022-06-10 DIAGNOSIS — R262 Difficulty in walking, not elsewhere classified: Secondary | ICD-10-CM | POA: Diagnosis not present

## 2022-06-10 DIAGNOSIS — N401 Enlarged prostate with lower urinary tract symptoms: Secondary | ICD-10-CM | POA: Diagnosis not present

## 2022-06-10 DIAGNOSIS — M6281 Muscle weakness (generalized): Secondary | ICD-10-CM | POA: Diagnosis not present

## 2022-06-15 ENCOUNTER — Ambulatory Visit: Payer: Medicare Other | Attending: Cardiology | Admitting: Cardiology

## 2022-06-15 ENCOUNTER — Encounter: Payer: Self-pay | Admitting: Cardiology

## 2022-06-15 ENCOUNTER — Telehealth: Payer: Self-pay | Admitting: Cardiology

## 2022-06-15 VITALS — BP 146/84 | HR 75 | Ht 72.0 in | Wt 222.6 lb

## 2022-06-15 DIAGNOSIS — I5033 Acute on chronic diastolic (congestive) heart failure: Secondary | ICD-10-CM | POA: Insufficient documentation

## 2022-06-15 DIAGNOSIS — I10 Essential (primary) hypertension: Secondary | ICD-10-CM | POA: Diagnosis not present

## 2022-06-15 DIAGNOSIS — D6869 Other thrombophilia: Secondary | ICD-10-CM | POA: Diagnosis not present

## 2022-06-15 DIAGNOSIS — I4892 Unspecified atrial flutter: Secondary | ICD-10-CM | POA: Insufficient documentation

## 2022-06-15 DIAGNOSIS — I25118 Atherosclerotic heart disease of native coronary artery with other forms of angina pectoris: Secondary | ICD-10-CM | POA: Diagnosis not present

## 2022-06-15 DIAGNOSIS — I483 Typical atrial flutter: Secondary | ICD-10-CM | POA: Diagnosis not present

## 2022-06-15 MED ORDER — AMIODARONE HCL 200 MG PO TABS: ORAL_TABLET | ORAL | 0 refills | Status: DC

## 2022-06-15 MED ORDER — APIXABAN 5 MG PO TABS: 5.0000 mg | ORAL_TABLET | Freq: Two times a day (BID) | ORAL | 6 refills | Status: DC

## 2022-06-15 MED ORDER — FUROSEMIDE 20 MG PO TABS: 20.0000 mg | ORAL_TABLET | Freq: Every day | ORAL | 3 refills | Status: DC

## 2022-06-15 NOTE — Assessment & Plan Note (Addendum)
His Apple Watch suggested atrial fibrillation, however, With atrial flutter and variable block it is probably that he just had atrial flutter.  He has a Zio patch completed but not yet ready read.  Would like to know if he is truly paroxysmal with short episodes where if he is persistent as this would make a difference in management plans.  If he is paroxysmal, I do not know that cardioversion would be helpful without antiarrhythmic agent.  However if he is persistent, then cardioversion earlier would be beneficial.  Plan: Start Eliquis 5 mg twice daily With hopes to potentially chemically cardiovert, we will start amiodarone load (not planning for long-term) =>  Amiodarone 400 mg ( 2 tablet) twice  a day for 6 doses  then 200 mg  ( 1 tablet)  twice a day until your appointment on May 7  While taking Amiodarone  only take Metoprolol ( Toprol)  50 mg Once a daily At the May appointment, would need to determine duration of amiodarone treatment but at that point would reduce to once daily 200 mg. Reassess as planned on Jun 22, 2020 with Bernadene Person, NP -> plan will be to have reviewed the Zio patch monitor and reassess rhythm after amiodarone load. If persistent atrial flutter/fib remains, would have low threshold to consider TEE cardioversion.   Will need to assess his symptoms after adding diuresis to determine if he would be able to tolerate a full 3-week Eliquis load versus TEE first. I briefly discussed the concept of both cardioversion and TEE with the patient. If he chemically cardioverted with amiodarone, may want to consider a pill in the pocket technique-and if truly atrial flutter, may be a candidate for atrial flutter ablation.

## 2022-06-15 NOTE — Assessment & Plan Note (Signed)
Elevated JVD worsening edema, weight gain with rales on exam in the setting of atrial flutter/fib.  I suspect he has diastolic function from underlying CAD and hypertension.  Currently only on metoprolol, not on afterload reduction, would potentially consider ARB once we are able to get him out of atrial flutter. Will add low-dose Lasix which he will take 20 mg daily for 5 days and then PRN afterwards.  (He had a similar episode of lower extremity edema following his recent hip surgery and responded well to short course of Lasix) I am reluctant in an 89 year old gentleman to keep him on STEMI dose of Lasix.  Can reassess when he is seen in follow-up. Will probably need to reassess echo once he is out of A-fib.

## 2022-06-15 NOTE — Assessment & Plan Note (Signed)
BP is high today, but I will reduce his Toprol while we have him on a short course of amiodarone.  I am adding a diuretic, on reassessment, potentially consider afterload reduction-but may need echo to help guide therapy.

## 2022-06-15 NOTE — Patient Instructions (Addendum)
Medication Instructions:    Furosemide ---Lasix  ( diuretic- to get rid of excess fluid in the body )  for the first 5 days take everyday then take if you are 3 lbs or heavier overnight as needed. ( Weight yourself daily ( you will base weight gain off your dry weight.)     Amiodarone 400 mg ( 2 tablet) twice  a day for 6 doses  then 200 mg  ( 1 tablet)  twice a day until your appointment on May 7  While taking Amiodarone  only take Metoprolol ( Toprol)  50 mg Once a daily   Eliquis  5 mg  one tablet twice a day  ( blood thinner)        *If you need a refill on your cardiac medications before your next appointment, please call your pharmacy*   Lab Work:  Not needed   Testing/Procedures:  Not needed  Follow-Up: At [facility], you and your health needs are our priority.  As part of our continuing mission to provide you with exceptional heart care, we have created designated Provider Care Teams.  These Care Teams include your primary Cardiologist (physician) and Advanced Practice Providers (APPs -  Physician Assistants and Nurse Practitioners) who all work together to provide you with the care you need, when you need it.     Your next appointment:   Keep appointment on May 7 , 2024  The format for your next appointment:   In Person  Provider:   Bernadene Person NP    Other Instructions

## 2022-06-15 NOTE — Assessment & Plan Note (Signed)
Tolerating atrial flutter and potential atrial fibrillation relatively well with just diastolic heart failure symptoms and not any chest pain or pressure despite having heart rates in the 120s to 130s.  He is on stable dose of beta-blocker, aspirin and statin.  Will keep aspirin for now but I suspect with him being on Eliquis we can potentially stop it.

## 2022-06-15 NOTE — Telephone Encounter (Signed)
Pt c/o Shortness Of Breath: STAT if SOB developed within the last 24 hours or pt is noticeably SOB on the phone  1. Are you currently SOB (can you hear that pt is SOB on the phone)? Yes, cannot hear over the phone   2. How long have you been experiencing SOB? 04/19  3. Are you SOB when sitting or when up moving around? Mostly moving around  4. Are you currently experiencing any other symptoms? Apple watch has been reporting a-fib

## 2022-06-15 NOTE — Assessment & Plan Note (Signed)
This patients CHA2DS2-VASc Score and unadjusted Ischemic Stroke Rate (% per year) is equal to 7.2 % stroke rate/year from a score of 5  Above score calculated as 1 point each if present [CHF, HTN, DM, Vascular=MI/PAD/Aortic Plaque, Age if 65-74, or Male] Above score calculated as 2 points each if present [Age > 75, or Stroke/TIA/TE]  Will initiate Eliquis 5 mg twice daily => with thoughts of potentially moving toward cardioversion, but would need to stay on it at least a month following cardioversion either chemical or electrical.

## 2022-06-15 NOTE — Telephone Encounter (Signed)
Patient was seen today in clinic.  He definitely had exertional dyspnea and some mild crackles in the lung as well as some mild edema.  Was in atrial flutter with a controlled rate.  Unfortunate the monitor results are not ready to be read yet.  He will keep his appointment for next week.  I started him on Eliquis 5 mg twice daily, Lasix 20 mg daily to take for 5 days and then as needed after that.  I also started him on amiodarone 4 mg twice daily for 3 days and then 200 mg twice daily, reduce his Toprol dose to once daily as opposed to 50 mg twice daily.  Hopefully he will cardiovert chemically, if not would probably want to consider TEE cardioversion depending on his symptoms because he is pretty symptomatic while in flutter.  But by that time we should have the results of his monitor back. Note to be done soon.   Bryan Lemma, MD

## 2022-06-15 NOTE — Progress Notes (Signed)
Primary Care Provider: Merlene Laughter, MD [facility] Cardiologist: Rollene Rotunda, MD Electrophysiologist: None  Clinic Note: Chief Complaint  Patient presents with   Atrial Fibrillation    Apple Watch shows atrial fibrillation (EKG actually shows atrial flutter with variable block) -> symptoms more notable for dyspnea worse with exertion, edema and fatigue.   Coronary Artery Disease    No angina    ===================================  ASSESSMENT/PLAN   Problem List Items Addressed This Visit       Cardiology Problems   New onset atrial flutter (HCC) - Primary    His Apple Watch suggested atrial fibrillation, however, With atrial flutter and variable block it is probably that he just had atrial flutter.  He has a Zio patch completed but not yet ready read.  Would like to know if he is truly paroxysmal with short episodes where if he is persistent as this would make a difference in management plans.  If he is paroxysmal, I do not know that cardioversion would be helpful without antiarrhythmic agent.  However if he is persistent, then cardioversion earlier would be beneficial.  Plan: Start Eliquis 5 mg twice daily With hopes to potentially chemically cardiovert, we will start amiodarone load (not planning for long-term) =>  Amiodarone 400 mg ( 2 tablet) twice  a day for 6 doses  then 200 mg  ( 1 tablet)  twice a day until your appointment on May 7  While taking Amiodarone  only take Metoprolol ( Toprol)  50 mg Once a daily At the May appointment, would need to determine duration of amiodarone treatment but at that point would reduce to once daily 200 mg. Reassess as planned on Jun 22, 2020 with Bernadene Person, NP -> plan will be to have reviewed the Zio patch monitor and reassess rhythm after amiodarone load. If persistent atrial flutter/fib remains, would have low threshold to consider TEE cardioversion.   Will need to assess his symptoms after adding diuresis to determine  if he would be able to tolerate a full 3-week Eliquis load versus TEE first. I briefly discussed the concept of both cardioversion and TEE with the patient. If he chemically cardioverted with amiodarone, may want to consider a pill in the pocket technique-and if truly atrial flutter, may be a candidate for atrial flutter ablation.       Relevant Medications   furosemide (LASIX) 20 MG tablet   apixaban (ELIQUIS) 5 MG TABS tablet   amiodarone (PACERONE) 200 MG tablet   Other Relevant Orders   EKG 12-Lead   Hypercoagulable state due to typical atrial flutter (HCC) (Chronic)    This patients CHA2DS2-VASc Score and unadjusted Ischemic Stroke Rate (% per year) is equal to 7.2 % stroke rate/year from a score of 5  Above score calculated as 1 point each if present [CHF, HTN, DM, Vascular=MI/PAD/Aortic Plaque, Age if 65-74, or Male] Above score calculated as 2 points each if present [Age > 75, or Stroke/TIA/TE]  Will initiate Eliquis 5 mg twice daily => with thoughts of potentially moving toward cardioversion, but would need to stay on it at least a month following cardioversion either chemical or electrical.      Relevant Medications   furosemide (LASIX) 20 MG tablet   apixaban (ELIQUIS) 5 MG TABS tablet   amiodarone (PACERONE) 200 MG tablet   Essential hypertension (Chronic)    BP is high today, but I will reduce his Toprol while we have him on a short course of amiodarone.  I  am adding a diuretic, on reassessment, potentially consider afterload reduction-but may need echo to help guide therapy.      Relevant Medications   furosemide (LASIX) 20 MG tablet   apixaban (ELIQUIS) 5 MG TABS tablet   amiodarone (PACERONE) 200 MG tablet   Coronary artery disease of native heart with stable angina pectoris (HCC) (Chronic)    Tolerating atrial flutter and potential atrial fibrillation relatively well with just diastolic heart failure symptoms and not any chest pain or pressure despite having  heart rates in the 120s to 130s.  He is on stable dose of beta-blocker, aspirin and statin.  Will keep aspirin for now but I suspect with him being on Eliquis we can potentially stop it.      Relevant Medications   furosemide (LASIX) 20 MG tablet   apixaban (ELIQUIS) 5 MG TABS tablet   amiodarone (PACERONE) 200 MG tablet   Acute on chronic diastolic heart failure (HCC) (Chronic)    Elevated JVD worsening edema, weight gain with rales on exam in the setting of atrial flutter/fib.  I suspect he has diastolic function from underlying CAD and hypertension.  Currently only on metoprolol, not on afterload reduction, would potentially consider ARB once we are able to get him out of atrial flutter. Will add low-dose Lasix which he will take 20 mg daily for 5 days and then PRN afterwards.  (He had a similar episode of lower extremity edema following his recent hip surgery and responded well to short course of Lasix) I am reluctant in an 89 year old gentleman to keep him on STEMI dose of Lasix.  Can reassess when he is seen in follow-up. Will probably need to reassess echo once he is out of A-fib.       Relevant Medications   furosemide (LASIX) 20 MG tablet   apixaban (ELIQUIS) 5 MG TABS tablet   amiodarone (PACERONE) 200 MG tablet    ===================================  HPI:    Marc Gilbert is a 89 y.o. male with a PMH notable for CAD-CABG (2018) below who presents today for WORK IN VISIT WITH CONCERN FOR ATRIAL FIBRILLATION, SHORTNESS OF BREATH, SWELLING at the request of [place], Ann Maki, MD.  Sylvester Harder was last seen by Dr. Antoine Poche on 11/27/2021.  Limited by hip pain using a rollator walker.  No anginal symptoms.  No PND orthopnea.  No edema. Plan indicated no need for further cardiac evaluation.  No changes to meds.  Preop assessment for hip surgery completed.  Plan was to follow-up with 1 year.  Recent Hospitalizations:  Left hip arthroplasty 12/25/2021  Reviewed  CV studies:     The following studies were reviewed today: (if available, images/films reviewed: From Epic Chart or Care Everywhere) Event monitor ordered, has been performed, but not yet ready to be read.:  Interval History:   Marc Gilbert presents here today as a work in visit.  His daughter (who is clearly medical and back) accompanies him today and indicated that she called in on the 20th with concerns for possible A-fib.  Basically he was sitting there on the couch and his Apple Watch indicated on April 19 but he may be in atrial fibrillation.  Initially did not really bother him all that much but that over the night his heart rate went into the 120s 130 bpm range about 2-3 times.  So they called the 20, and per Dr. Antoine Poche had a monitor called in which he has recently taken off to mail back to the  company last Friday => results are not yet ready to be read. Initial plan was in follow-up next week with Bernadene Person, NP however over the course the weekend has become little bit more dyspneic fatigue.  Is also become quite concerned about the Apple Watch constantly showing atrial fibrillation. He does not necessarily feel his heartbeat in the 20 most the time, but when he goes faster he feels a fluttering In his throat and his chest he feels very short of breath.  The short of breath is not as bad at rest but is certainly worse with exertion.  He is very easily fatigued now with even the slightest activity.  He denies any chest pain with rest or exertion.  He has increased edema over the last week or so but still usually goes morning but the level that it is right now is usually what would be at the end of the day.  He denies any PND except for maybe 1 occasion when this for started.  He has not had any episodes with heart rates been up in the 120s to 130s, and when that happened he did not necessarily feel lightheaded or dizzy.  Just more fatigued.  He thinks that the symptoms may be waxing and waning because he  feels proximal-isms of symptoms that would last anywhere from 10 minutes to half an hour although, he cannot be sure if his just heart rate changing or what.  CV Review of Symptoms (Summary): : positive for - dyspnea on exertion, edema, irregular heartbeat, palpitations, rapid heart rate, shortness of breath, and fatigue, exercise intolerance negative for - chest pain, loss of consciousness, orthopnea, or syncope or near syncope, TIA or amaurosis fugax, claudication  REVIEWED OF SYSTEMS   Review of Systems  Constitutional:  Positive for malaise/fatigue. Negative for weight loss (His weight is actually up about 5 pounds from a month ago.).  HENT:  Negative for congestion.   Respiratory:  Positive for cough and shortness of breath. Negative for wheezing.        Per HPI  Gastrointestinal:  Negative for blood in stool and melena.  Genitourinary:  Negative for hematuria.  Musculoskeletal:  Negative for joint pain.  Neurological:  Positive for dizziness and weakness (Generalized weakness and fatigue). Negative for focal weakness.  Psychiatric/Behavioral:  The patient is nervous/anxious.     I have reviewed and (if needed) personally updated the patient's problem list, medications, allergies, past medical and surgical history, social and family history.   PAST MEDICAL HISTORY   Past Medical History:  Diagnosis Date   Arthritis    "right hand; back" (08/30/2015)   Cancer (HCC)    skin  squamous and basal   Chronic lower back pain    CKD (chronic kidney disease), stage III (HCC)    Stage III   COPD (chronic obstructive pulmonary disease) (HCC)    no home O2   Coronary artery disease    Dysplastic colon polyp age 72   carcinoma in situ   GERD (gastroesophageal reflux disease)    occ   H/O hiatal hernia    Hyperlipidemia    Hypertension    Prostate cancer (HCC) 07/2014   active surveillance, Glisson 6    PAST SURGICAL HISTORY   Past Surgical History:  Procedure Laterality Date    CARDIAC CATHETERIZATION     CATARACT EXTRACTION W/ INTRAOCULAR LENS IMPLANT Left 08/27/2015   CORONARY ARTERY BYPASS GRAFT N/A 08/14/2016   Procedure: CORONARY ARTERY BYPASS GRAFTING (CABG)x2, using left  internal mammary artery and right greater saphenous vein harvested endoscopically;  Surgeon: Alleen Borne, MD;  Location: [facility] OR;  Service: Open Heart Surgery;  Laterality: N/A;   JOINT REPLACEMENT     LEFT HEART CATH AND CORONARY ANGIOGRAPHY N/A 08/11/2016   Procedure: Left Heart Cath and Coronary Angiography;  Surgeon: Lyn Records, MD;  Location: [facility] INVASIVE CV LAB;  Service: Cardiovascular;  Laterality: N/A;   MOHS SURGERY     multiple SCC   PROSTATE BIOPSY  07/18/2014   TEE WITHOUT CARDIOVERSION N/A 08/14/2016   Procedure: TRANSESOPHAGEAL ECHOCARDIOGRAM (TEE);  Surgeon: Alleen Borne, MD;  Location: [facility] OR;  Service: Open Heart Surgery;  Laterality: N/A;   TONSILLECTOMY     TOTAL HIP ARTHROPLASTY  05/26/2011   Procedure: TOTAL HIP ARTHROPLASTY;  Surgeon: Valeria Batman, MD;  Location: [facility] OR;  Service: Orthopedics;  Laterality: Right;   TOTAL HIP ARTHROPLASTY Left 12/25/2021   Procedure: LEFT TOTAL HIP ARTHROPLASTY ANTERIOR APPROACH;  Surgeon: Kathryne Hitch, MD;  Location: [facility] ORS;  Service: Orthopedics;  Laterality: Left;   TOTAL SHOULDER ARTHROPLASTY Right 06/18/2020   Procedure: TOTAL SHOULDER ARTHROPLASTY;  Surgeon: Teryl Lucy, MD;  Location: [facility] ORS;  Service: Orthopedics;  Laterality: Right;   ULTRASOUND GUIDANCE FOR VASCULAR ACCESS  08/11/2016   Procedure: Ultrasound Guidance For Vascular Access;  Surgeon: Lyn Records, MD;  Location: [facility] INVASIVE CV LAB;  Service: Cardiovascular;;   Cath 07/2016: This distal LM 60%.  Critical 95%p-mLAD-heavily calcified.  Small OM1 50%.  30% mid LCx.  Widely patent dominant RCA.  EF 45 to 50%.  Mildly elevated LVEDP c/w acute CHF. => Referred for CABG Dominance: Co-dominant       TTE 08/11/2016: EF 55 to 60%.  Discordant  septal motion.  GR 1 DD.  AOV sclerosis but no stenosis.  Aortic root 39 mm.  Trivial MR.  Moderate RV dysfunction.  Peak PAP 32 mmHg.  Normal RAP/CVP CABG /08/14/2016= x2 LIMA-LAD, SV- G-OM   MEDICATIONS/ALLERGIES   Current Meds  Medication Sig   amoxicillin (AMOXIL) 500 MG tablet Take 2,000 mg by mouth See admin instructions. Take prior to dental visits.   aspirin 81 MG tablet Take 81 mg by mouth at bedtime.   atorvastatin (LIPITOR) 40 MG tablet Take 1 tablet (40 mg total) by mouth daily. (Patient taking differently: Take 40 mg by mouth at bedtime.)   diclofenac Sodium (VOLTAREN) 1 % GEL Apply 1 application topically daily.   docusate sodium (COLACE) 250 MG capsule Take 250 mg by mouth daily.   gabapentin (NEURONTIN) 300 MG capsule 2 capsules Orally three times a day for 90 days   Melatonin 10 MG TABS Take 10 mg by mouth at bedtime.   metoprolol succinate (TOPROL-XL) 50 MG 24 hr tablet Take 1 tablet (50 mg total) by mouth 2 (two) times daily.   Multiple Vitamins-Minerals (ONE-A-DAY MENS 50+ ADVANTAGE) TABS Take 1 tablet by mouth daily with breakfast.   Omega-3 Fatty Acids (FISH OIL OMEGA-3 PO) Take 2 capsules by mouth daily. 980 mg per cap   oxybutynin (DITROPAN) 5 MG tablet Take 5 mg by mouth at bedtime.   polyethylene glycol (MIRALAX / GLYCOLAX) 17 g packet Take 8.5 g by mouth daily in the afternoon.   sodium chloride (OCEAN) 0.65 % SOLN nasal spray Place 1 spray into both nostrils 4 (four) times daily.   Tamsulosin HCl (FLOMAX) 0.4 MG CAPS Take 0.4 mg by mouth at bedtime.    No Known Allergies  SOCIAL  HISTORY/FAMILY HISTORY   Reviewed in Epic:  Pertinent findings:  Social History   Tobacco Use   Smoking status: Former    Packs/day: 0.50    Years: 25.00    Additional pack years: 0.00    Total pack years: 12.50    Types: Cigarettes    Quit date: 05/24/1988    Years since quitting: 34.0   Smokeless tobacco: Never  Vaping Use   Vaping Use: Never used  Substance Use Topics    Alcohol use: Yes    Alcohol/week: 10.0 standard drinks of alcohol    Types: 5 Glasses of wine, 5 Cans of beer per week    Comment: daily   Drug use: No   Social History   Social History Narrative   Not on file    OBJCTIVE -PE, EKG, labs   Wt Readings from Last 3 Encounters:  06/15/22 222 lb 9.6 oz (101 kg)  04/29/22 217 lb 12.8 oz (98.8 kg)  12/25/21 215 lb (97.5 kg)    Physical Exam: BP (!) 146/84   Pulse 75   Ht 6' (1.829 m)   Wt 222 lb 9.6 oz (101 kg)   SpO2 92%   BMI 30.19 kg/m  Physical Exam Vitals reviewed.  Constitutional:      General: He is not in acute distress.    Appearance: Normal appearance. He is obese. He is not ill-appearing.  HENT:     Head: Normocephalic and atraumatic.  Neck:     Vascular: JVD (Pulsatile canon A waves up to 8-9 cm water) present. No carotid bruit.  Cardiovascular:     Rate and Rhythm: Normal rate. Rhythm irregularly irregular.     Chest Wall: PMI is not displaced.     Pulses: Decreased pulses (Diminished due to mild edema).     Heart sounds: S1 normal and S2 normal. Heart sounds are distant. No murmur (Cannot exclude soft HSM at apex.) heard.    No friction rub. No gallop.  Pulmonary:     Effort: Pulmonary effort is normal. No respiratory distress.     Breath sounds: No stridor. Rales (Bibasal but L>R basal) present. No wheezing or rhonchi.  Chest:     Chest wall: No tenderness.  Abdominal:     General: Abdomen is flat. Bowel sounds are normal. There is no distension.     Palpations: There is no mass.     Tenderness: There is no abdominal tenderness. There is no guarding.     Comments: Obese.  No HSM  Musculoskeletal:     Cervical back: Normal range of motion and neck supple.     Right lower leg: Edema (2+ ankle and pedal) present.     Left lower leg: Edema (2+ ankle and pedal) present.  Skin:    General: Skin is warm and dry.  Neurological:     General: No focal deficit present.     Mental Status: He is alert and  oriented to person, place, and time.     Gait: Gait abnormal.  Psychiatric:        Mood and Affect: Mood normal.        Behavior: Behavior normal.        Thought Content: Thought content normal.        Judgment: Judgment normal.     Adult ECG Report  Rate: 75;  Rhythm: atrial flutter with variable block.  RBBB with T wave inversions likely repolarization abnormality  Narrative Interpretation: Atrial flutter is new  Recent Labs:  Reviewed Lab Results  Component Value Date   CHOL 157 08/10/2016   HDL 56 08/10/2016   LDLCALC 87 08/10/2016   TRIG 72 08/10/2016   CHOLHDL 2.8 08/10/2016   Lab Results  Component Value Date   CREATININE 1.19 12/26/2021   BUN 20 12/26/2021   NA 137 12/26/2021   K 4.4 12/26/2021   CL 106 12/26/2021   CO2 25 12/26/2021      Latest Ref Rng & Units 12/26/2021    3:08 AM 12/16/2021    9:39 AM 06/19/2020    3:06 AM  CBC  WBC 4.0 - 10.5 K/uL 10.4  7.0  9.5   Hemoglobin 13.0 - 17.0 g/dL 16.1  09.6  04.5   Hematocrit 39.0 - 52.0 % 35.3  45.0  38.5   Platelets 150 - 400 K/uL 171  183  171     Lab Results  Component Value Date   HGBA1C 5.7 (H) 08/10/2016   No results found for: "TSH"  ================================================== I spent a total of 41 minutes with the patient spent in direct patient consultation.  Additional time spent with chart review  / charting (studies, outside notes, etc): 34 min Total Time: 75 min  Current medicines are reviewed at length with the patient today.  (+/- concerns) notes swelling and dyspnea.  Notice: This dictation was prepared with Dragon dictation along with smart phrase technology. Any transcriptional errors that result from this process are unintentional and may not be corrected upon review.  Studies Ordered:   Orders Placed This Encounter  Procedures   EKG 12-Lead   Meds ordered this encounter  Medications   furosemide (LASIX) 20 MG tablet    Sig: Take 1 tablet (20 mg total) by mouth daily.  As needed.    Dispense:  30 tablet    Refill:  3   apixaban (ELIQUIS) 5 MG TABS tablet    Sig: Take 1 tablet (5 mg total) by mouth 2 (two) times daily.    Dispense:  60 tablet    Refill:  6   amiodarone (PACERONE) 200 MG tablet    Sig: Take 2 tablets (400 mg total) by mouth 2 (two) times daily for 3 days, THEN 1 tablet (200 mg total) 2 (two) times daily for 19 days.    Dispense:  46 tablet    Refill:  0    Patient Instructions / Medication Changes & Studies & Tests Ordered   Patient Instructions  Medication Instructions:    Furosemide ---Lasix  ( diuretic- to get rid of excess fluid in the body )  for the first 5 days take everyday then take if you are 3 lbs or heavier overnight as needed. ( Weight yourself daily ( you will base weight gain off your dry weight.)     Amiodarone 400 mg ( 2 tablet) twice  a day for 6 doses  then 200 mg  ( 1 tablet)  twice a day until your appointment on May 7  While taking Amiodarone  only take Metoprolol ( Toprol)  50 mg Once a daily   Eliquis  5 mg  one tablet twice a day  ( blood thinner)        *If you need a refill on your cardiac medications before your next appointment, please call your pharmacy*   Lab Work:  Not needed   Testing/Procedures:  Not needed  Follow-Up: At [facility], you and your health needs are our priority.  As part of our continuing mission  to provide you with exceptional heart care, we have created designated Provider Care Teams.  These Care Teams include your primary Cardiologist (physician) and Advanced Practice Providers (APPs -  Physician Assistants and Nurse Practitioners) who all work together to provide you with the care you need, when you need it.     Your next appointment:   Keep appointment on May 7 , 2024  The format for your next appointment:   In Person  Provider:   Bernadene Person NP    Other Instructions       Marykay Lex, MD, MS Bryan Lemma, M.D., M.S. Interventional  Cardiologist  [facility]  Pager # [phone] Phone # [phone] [address] [place], [place] [address]   Thank you for choosing [facility]!!

## 2022-06-15 NOTE — Telephone Encounter (Signed)
Patient states Sob for about 10 days when ambulating, has to stop to take a breath Has a pulse that pounds in my heart. Just finished Holter monitor this weekend but no results released as yet. Marland Kitchen Appt on May 7th for E. Monge.  Slight swelling to ankles not feet, low cut socks will leave a mark. No compression stocking.  Elevates feet on a step stool during the day.   Apple watch recording A-fib events, unsure how how often.  States when hits "button for ECG" it shows A-fib. Checks several times a day. He does not want to wait until next week to be seen.  Place do open slot for DOD today.  He still wants to keep appt. For next week as Dr Antoine Poche will be "in the building".  Advised I would leave this and can discuss with the DOD today

## 2022-06-16 ENCOUNTER — Encounter: Payer: Self-pay | Admitting: Cardiology

## 2022-06-17 DIAGNOSIS — M6281 Muscle weakness (generalized): Secondary | ICD-10-CM | POA: Diagnosis not present

## 2022-06-17 DIAGNOSIS — R262 Difficulty in walking, not elsewhere classified: Secondary | ICD-10-CM | POA: Diagnosis not present

## 2022-06-19 DIAGNOSIS — N1831 Chronic kidney disease, stage 3a: Secondary | ICD-10-CM | POA: Diagnosis not present

## 2022-06-19 DIAGNOSIS — I484 Atypical atrial flutter: Secondary | ICD-10-CM | POA: Diagnosis not present

## 2022-06-19 DIAGNOSIS — J449 Chronic obstructive pulmonary disease, unspecified: Secondary | ICD-10-CM | POA: Diagnosis not present

## 2022-06-19 DIAGNOSIS — I700 Atherosclerosis of aorta: Secondary | ICD-10-CM | POA: Diagnosis not present

## 2022-06-19 DIAGNOSIS — Z96642 Presence of left artificial hip joint: Secondary | ICD-10-CM | POA: Diagnosis not present

## 2022-06-19 DIAGNOSIS — I129 Hypertensive chronic kidney disease with stage 1 through stage 4 chronic kidney disease, or unspecified chronic kidney disease: Secondary | ICD-10-CM | POA: Diagnosis not present

## 2022-06-19 DIAGNOSIS — I5033 Acute on chronic diastolic (congestive) heart failure: Secondary | ICD-10-CM | POA: Diagnosis not present

## 2022-06-19 DIAGNOSIS — E7800 Pure hypercholesterolemia, unspecified: Secondary | ICD-10-CM | POA: Diagnosis not present

## 2022-06-22 ENCOUNTER — Telehealth: Payer: Self-pay | Admitting: Cardiology

## 2022-06-22 DIAGNOSIS — I4891 Unspecified atrial fibrillation: Secondary | ICD-10-CM | POA: Diagnosis not present

## 2022-06-22 NOTE — Telephone Encounter (Signed)
ZIO XT final report faxed to [facility] office (Dr. Antoine Poche patient).

## 2022-06-22 NOTE — Telephone Encounter (Signed)
[facility] calling with abnormal results, transferred to Triage.

## 2022-06-22 NOTE — Telephone Encounter (Signed)
   Cardiac Monitor Alert  Date of alert:  06/22/2022   Patient Name: Marc Gilbert  DOB: 1930/11/22  MRN: [id number]   [facility] Cardiologist: Rollene Rotunda, MD  [facility] EP:  None    Monitor Information: Long Term Monitor [ZioXT]  Reason:   Ordering provider:  Hochrein   Alert Pause in monitor   Other: Two episodes of pause in rhythm.  First episode - pause for 3.1 seconds Second episode - pause for 4.1 seconds Can be found on Page 22 Strip #3.  Will be available for review in approx 15-20 minutes  Candie Chroman, LPN  4/0/9811 9:14 PM

## 2022-06-23 ENCOUNTER — Ambulatory Visit: Payer: Medicare Other | Attending: Nurse Practitioner | Admitting: Nurse Practitioner

## 2022-06-23 ENCOUNTER — Encounter: Payer: Self-pay | Admitting: Nurse Practitioner

## 2022-06-23 ENCOUNTER — Other Ambulatory Visit: Payer: Self-pay

## 2022-06-23 VITALS — BP 114/68 | HR 74 | Wt 224.2 lb

## 2022-06-23 DIAGNOSIS — I10 Essential (primary) hypertension: Secondary | ICD-10-CM | POA: Insufficient documentation

## 2022-06-23 DIAGNOSIS — Z86718 Personal history of other venous thrombosis and embolism: Secondary | ICD-10-CM | POA: Diagnosis not present

## 2022-06-23 DIAGNOSIS — I4892 Unspecified atrial flutter: Secondary | ICD-10-CM | POA: Diagnosis not present

## 2022-06-23 DIAGNOSIS — I4819 Other persistent atrial fibrillation: Secondary | ICD-10-CM

## 2022-06-23 DIAGNOSIS — E785 Hyperlipidemia, unspecified: Secondary | ICD-10-CM | POA: Diagnosis not present

## 2022-06-23 DIAGNOSIS — N183 Chronic kidney disease, stage 3 unspecified: Secondary | ICD-10-CM | POA: Diagnosis not present

## 2022-06-23 DIAGNOSIS — I2510 Atherosclerotic heart disease of native coronary artery without angina pectoris: Secondary | ICD-10-CM

## 2022-06-23 NOTE — Patient Instructions (Addendum)
Medication Instructions:  Stop Metoprolol as directed. Stop Asprin as directed. Furosemide 40 mg for 3 days, then resume 20 mg daily. *If you need a refill on your cardiac medications before your next appointment, please call your pharmacy*   Lab Work: .Complete lab work today and in 1 week.  CBC, CMET, TSH(today) BMET 1 week.    If you have labs (blood work) drawn today and your tests are completely normal, you will receive your results only by: MyChart Message (if you have MyChart) OR A paper copy in the mail If you have any lab test that is abnormal or we need to change your treatment, we will call you to review the results.   Testing/Procedures: NONE ordered at this time of appointment     Follow-Up: At [facility], you and your health needs are our priority.  As part of our continuing mission to provide you with exceptional heart care, we have created designated Provider Care Teams.  These Care Teams include your primary Cardiologist (physician) and Advanced Practice Providers (APPs -  Physician Assistants and Nurse Practitioners) who all work together to provide you with the care you need, when you need it.  We recommend signing up for the patient portal called "MyChart".  Sign up information is provided on this After Visit Summary.  MyChart is used to connect with patients for Virtual Visits (Telemedicine).  Patients are able to view lab/test results, encounter notes, upcoming appointments, etc.  Non-urgent messages can be sent to your provider as well.   To learn more about what you can do with MyChart, go to [email or web address].    Your next appointment:   6-8 week(s)  Provider:   Rollene Rotunda, MD  or Bernadene Person, NP        Other Instructions EP referral sent

## 2022-06-23 NOTE — Progress Notes (Signed)
Office Visit    Patient Name: Marc Gilbert Date of Encounter: 06/23/2022  Primary Care Provider:  Collene Mares, [place] Primary Cardiologist:  Rollene Rotunda, MD  Chief Complaint    89 year old male with a history of CAD s/p CABG x 2 (LIMA-LAD, SVG-OM) in 2018, persistent atrial fibrillation/atrial flutter, DVT, hypertension, hyperlipidemia, CKD stage III, COPD, prostate cancer, and GERD who presents for follow-up related atrial fibrillation/atrial flutter.  Past Medical History    Past Medical History:  Diagnosis Date   Arthritis    "right hand; back" (08/30/2015)   Cancer (HCC)    skin  squamous and basal   Chronic lower back pain    CKD (chronic kidney disease), stage III (HCC)    Stage III   COPD (chronic obstructive pulmonary disease) (HCC)    no home O2   Coronary artery disease    Dysplastic colon polyp age 39   carcinoma in situ   GERD (gastroesophageal reflux disease)    occ   H/O hiatal hernia    Hyperlipidemia    Hypertension    Prostate cancer (HCC) 07/2014   active surveillance, Glisson 6   Past Surgical History:  Procedure Laterality Date   CARDIAC CATHETERIZATION     CATARACT EXTRACTION W/ INTRAOCULAR LENS IMPLANT Left 08/27/2015   CORONARY ARTERY BYPASS GRAFT N/A 08/14/2016   Procedure: CORONARY ARTERY BYPASS GRAFTING (CABG)x2, using left internal mammary artery and right greater saphenous vein harvested endoscopically;  Surgeon: Alleen Borne, MD;  Location: [facility] OR;  Service: Open Heart Surgery;  Laterality: N/A;   JOINT REPLACEMENT     LEFT HEART CATH AND CORONARY ANGIOGRAPHY N/A 08/11/2016   Procedure: Left Heart Cath and Coronary Angiography;  Surgeon: Lyn Records, MD;  Location: [facility] INVASIVE CV LAB;  Service: Cardiovascular;  Laterality: N/A;   MOHS SURGERY     multiple SCC   PROSTATE BIOPSY  07/18/2014   TEE WITHOUT CARDIOVERSION N/A 08/14/2016   Procedure: TRANSESOPHAGEAL ECHOCARDIOGRAM (TEE);  Surgeon: Alleen Borne, MD;  Location: [facility]  OR;  Service: Open Heart Surgery;  Laterality: N/A;   TONSILLECTOMY     TOTAL HIP ARTHROPLASTY  05/26/2011   Procedure: TOTAL HIP ARTHROPLASTY;  Surgeon: Valeria Batman, MD;  Location: [facility] OR;  Service: Orthopedics;  Laterality: Right;   TOTAL HIP ARTHROPLASTY Left 12/25/2021   Procedure: LEFT TOTAL HIP ARTHROPLASTY ANTERIOR APPROACH;  Surgeon: Kathryne Hitch, MD;  Location: [facility] ORS;  Service: Orthopedics;  Laterality: Left;   TOTAL SHOULDER ARTHROPLASTY Right 06/18/2020   Procedure: TOTAL SHOULDER ARTHROPLASTY;  Surgeon: Teryl Lucy, MD;  Location: [facility] ORS;  Service: Orthopedics;  Laterality: Right;   ULTRASOUND GUIDANCE FOR VASCULAR ACCESS  08/11/2016   Procedure: Ultrasound Guidance For Vascular Access;  Surgeon: Lyn Records, MD;  Location: [facility] INVASIVE CV LAB;  Service: Cardiovascular;;    Allergies  No Known Allergies   Labs/Other Studies Reviewed    The following studies were reviewed today: 3-day Zio 05/2022: Patch Wear Time:  3 days and 0 hours (2024-04-24T16:33:57-0400 to 2024-04-27T16:38:48-0400)   Atrial Flutter occurred continuously (100% burden), ranging from 30-133 bpm (avg of 73 bpm). 32 Pauses occurred, the longest lasting 6.1 secs (10 bpm). Isolated VEs were occasional (1.5%, 4380), VE Couplets were rare (<1.0%, 71), and no VE Triplets were  present. Ventricular Bigeminy and Trigeminy were present. MD notification criteria for Pauses met - notified Ralene Muskrat. (LPN) on 22 Jun 2022 at 2:16 pm CDT [facility]).  Recent Labs: 12/26/2021: BUN 20;  Creatinine, Ser 1.19; Hemoglobin 11.5; Platelets 171; Potassium 4.4; Sodium 137  Recent Lipid Panel    Component Value Date/Time   CHOL 157 08/10/2016 1302   TRIG 72 08/10/2016 1302   HDL 56 08/10/2016 1302   CHOLHDL 2.8 08/10/2016 1302   VLDL 14 08/10/2016 1302   LDLCALC 87 08/10/2016 1302    History of Present Illness    89 year old male with the above past medical history including CAD s/p CABG x 2 (LIMA-LAD, SVG-OM)  in 2018, persistent atrial fibrillation/atrial flutter, DVT, hypertension, hyperlipidemia, CKD stage III, COPD, prostate cancer, and GERD.  He underwent CABG x 2 (LIMA-LAD, SVG-OM) in 2018.  Echocardiogram at the time showed EF 55 to 60%, G1 DD, mild RV dilation, moderate RV systolic dysfunction, no significant valvular abnormalities.  He contacted our office on 06/08/2022 with concern for Apple Watch notifications of atrial fibrillation.  He was last seen in the office on 06/15/2022 and was noted to be in atrial flutter with variable block, rate controlled.  He noted fatigue, shortness of breath, bilateral lower extremity edema.  He was started on Eliquis, metoprolol, loaded with amiodarone, and given a prescription for Lasix.  It was noted that if he should not cardiovert chemically, he would likely benefit from TEE/cardioversion.  3-day ZIO revealed continuous atrial flutter (100% burden), HR ranging from 30 to 133 bpm, 32 pauses occurred, longest lasting 6.1 seconds.  He presents today for follow-up accompanied by his wife and daughter.  Since his last visit stable overall from a cardiac standpoint.  He does note some mildly increased dyspnea, ongoing bilateral lower extremity edema.  He denies chest pain, palpitations, dizziness, presyncope, syncope, PND, orthopnea, or significant weight gain (though his weight has not decreased with oral Lasix).   Home Medications    Current Outpatient Medications  Medication Sig Dispense Refill   amiodarone (PACERONE) 200 MG tablet Take 2 tablets (400 mg total) by mouth 2 (two) times daily for 3 days, THEN 1 tablet (200 mg total) 2 (two) times daily for 19 days. 46 tablet 0   amoxicillin (AMOXIL) 500 MG tablet Take 2,000 mg by mouth See admin instructions. Take prior to dental visits.     apixaban (ELIQUIS) 5 MG TABS tablet Take 1 tablet (5 mg total) by mouth 2 (two) times daily. 60 tablet 6   atorvastatin (LIPITOR) 40 MG tablet Take 1 tablet (40 mg total) by mouth  daily. (Patient taking differently: Take 40 mg by mouth at bedtime.) 90 tablet 3   diclofenac Sodium (VOLTAREN) 1 % GEL Apply 1 application topically daily.     docusate sodium (COLACE) 250 MG capsule Take 250 mg by mouth daily.     furosemide (LASIX) 20 MG tablet Take 1 tablet (20 mg total) by mouth daily. As needed. (Patient taking differently: Take 20 mg by mouth daily. Take 40 mg for 3 days, then resume 20 mg daily.) 30 tablet 3   gabapentin (NEURONTIN) 300 MG capsule 2 capsules Orally three times a day for 90 days     Melatonin 10 MG TABS Take 10 mg by mouth at bedtime.     Multiple Vitamins-Minerals (ONE-A-DAY MENS 50+ ADVANTAGE) TABS Take 1 tablet by mouth daily with breakfast.     Omega-3 Fatty Acids (FISH OIL OMEGA-3 PO) Take 2 capsules by mouth daily. 980 mg per cap     oxybutynin (DITROPAN) 5 MG tablet Take 5 mg by mouth at bedtime.     polyethylene glycol (MIRALAX / GLYCOLAX) 17 g packet  Take 8.5 g by mouth daily in the afternoon.     sodium chloride (OCEAN) 0.65 % SOLN nasal spray Place 1 spray into both nostrils 4 (four) times daily.     Tamsulosin HCl (FLOMAX) 0.4 MG CAPS Take 0.4 mg by mouth at bedtime.     No current facility-administered medications for this visit.     Review of Systems    He denies chest pain, palpitations, pnd, orthopnea, n, v, dizziness, syncope, weight gain, or early satiety. All other systems reviewed and are otherwise negative except as noted above.   Physical Exam    VS:  BP 114/68   Pulse 74   Wt 224 lb 3.2 oz (101.7 kg)   SpO2 94%   BMI 30.41 kg/m  GEN: Well nourished, well developed, in no acute distress. HEENT: normal. Neck: Supple, no JVD, carotid bruits, or masses. Cardiac: RRR, no murmurs, rubs, or gallops. No clubbing, cyanosis, edema.  Radials/DP/PT 2+ and equal bilaterally.  Respiratory:  Respirations regular and unlabored, clear to auscultation bilaterally. GI: Soft, nontender, nondistended, BS + x 4. MS: no deformity or  atrophy. Skin: warm and dry, no rash. Neuro:  Strength and sensation are intact. Psych: Normal affect.  Accessory Clinical Findings    ECG personally reviewed by me today -atrial flutter, 74 bpm, RBBB- no acute changes.   Lab Results  Component Value Date   WBC 10.4 12/26/2021   HGB 11.5 (L) 12/26/2021   HCT 35.3 (L) 12/26/2021   MCV 97.8 12/26/2021   PLT 171 12/26/2021   Lab Results  Component Value Date   CREATININE 1.19 12/26/2021   BUN 20 12/26/2021   NA 137 12/26/2021   K 4.4 12/26/2021   CL 106 12/26/2021   CO2 25 12/26/2021   Lab Results  Component Value Date   ALT 20 08/30/2015   AST 20 08/30/2015   ALKPHOS 44 08/30/2015   BILITOT 0.8 08/30/2015   Lab Results  Component Value Date   CHOL 157 08/10/2016   HDL 56 08/10/2016   LDLCALC 87 08/10/2016   TRIG 72 08/10/2016   CHOLHDL 2.8 08/10/2016    Lab Results  Component Value Date   HGBA1C 5.7 (H) 08/10/2016    Assessment & Plan   1. Persistent atrial fibrillation/atrial flutter/history of pauses: Recent 3-day ZIO in the setting of newly diagnosed atrial fibrillation/atrial flutter revealed continuous atrial flutter (100% burden), HR ranging from 30 to 133 bpm, 32 pauses occurred, longest lasting 6.1 seconds. EKG today shows rate controlled atrial flutter.   He notes mildly increased dyspnea, ongoing bilateral lower extremity edema. He denies palpitations, dizziness, presyncope, syncope.   Reviewed cardiac monitor results, EKG today with Dr. Antoine Poche.  Will DC metoprolol.  Will place urgent referral to EP given need for probable TEE/DCCV, with high risk for prolonged pause/bradycardia given recent monitor results.  Will check CBC, CMET, TSH, and magnesium today.  Will increase Lasix to 40 mg daily x 3 days followed by 20 mg daily. Will repeat BMET in 1 week. Discussed ED precautions. Continue amiodarone, Eliquis.   2. CAD: S/p CABG x 2 (LIMA-LAD, SVG-OM) in 2018. Stable with no anginal symptoms. No indication for  ischemic evaluation.  Will DC aspirin in the setting of new Eliquis.  Continue Lipitor.  3. History of DVT: Resolved, on Eliquis as above with new atrial fibrillation/atrial flutter.  4. Hypertension: BP well controlled. Continue current antihypertensive regimen.   5. Hyperlipidemia: LDL was 74 in 08/2021.  Continue Lipitor.  6. CKD  stage III: Patient's daughter notes creatinine was checked at PCPs office last week and was 1.69.  Repeat labs pending.  7. Disposition: Follow-up ASAP with EP, follow-up in 6 to 8 weeks with Dr. Antoine Poche or APP.      Joylene Grapes, NP 06/23/2022, 5:42 PM

## 2022-06-24 LAB — CBC
Hematocrit: 42.4 % (ref 37.5–51.0)
Hemoglobin: 13.8 g/dL (ref 13.0–17.7)
MCH: 30.1 pg (ref 26.6–33.0)
MCHC: 32.5 g/dL (ref 31.5–35.7)
MCV: 92 fL (ref 79–97)
Platelets: 169 10*3/uL (ref 150–450)
RBC: 4.59 x10E6/uL (ref 4.14–5.80)
RDW: 13.9 % (ref 11.6–15.4)
WBC: 6.2 10*3/uL (ref 3.4–10.8)

## 2022-06-24 LAB — COMPREHENSIVE METABOLIC PANEL
ALT: 32 IU/L (ref 0–44)
AST: 31 IU/L (ref 0–40)
Albumin/Globulin Ratio: 1.5 (ref 1.2–2.2)
Albumin: 4.3 g/dL (ref 3.7–4.7)
Alkaline Phosphatase: 108 IU/L (ref 44–121)
BUN/Creatinine Ratio: 21 (ref 10–24)
BUN: 32 mg/dL — ABNORMAL HIGH (ref 8–27)
Bilirubin Total: 0.6 mg/dL (ref 0.0–1.2)
CO2: 24 mmol/L (ref 20–29)
Calcium: 9.3 mg/dL (ref 8.6–10.2)
Chloride: 102 mmol/L (ref 96–106)
Creatinine, Ser: 1.52 mg/dL — ABNORMAL HIGH (ref 0.76–1.27)
Globulin, Total: 2.8 g/dL (ref 1.5–4.5)
Glucose: 104 mg/dL — ABNORMAL HIGH (ref 70–99)
Potassium: 5.2 mmol/L (ref 3.5–5.2)
Sodium: 141 mmol/L (ref 134–144)
Total Protein: 7.1 g/dL (ref 6.0–8.5)
eGFR: 44 mL/min/{1.73_m2} — ABNORMAL LOW (ref >59)

## 2022-06-24 LAB — TSH: TSH: 10.2 u[IU]/mL — ABNORMAL HIGH (ref 0.450–4.500)

## 2022-06-24 NOTE — Telephone Encounter (Signed)
Patient seen in office 06/23/22

## 2022-06-25 ENCOUNTER — Other Ambulatory Visit: Payer: Self-pay

## 2022-06-25 DIAGNOSIS — Z79899 Other long term (current) drug therapy: Secondary | ICD-10-CM

## 2022-06-25 DIAGNOSIS — E785 Hyperlipidemia, unspecified: Secondary | ICD-10-CM

## 2022-06-25 DIAGNOSIS — I4819 Other persistent atrial fibrillation: Secondary | ICD-10-CM

## 2022-06-25 DIAGNOSIS — R7989 Other specified abnormal findings of blood chemistry: Secondary | ICD-10-CM

## 2022-06-26 ENCOUNTER — Encounter: Payer: Self-pay | Admitting: Cardiovascular Disease

## 2022-06-26 ENCOUNTER — Ambulatory Visit: Payer: Medicare Other | Attending: Cardiovascular Disease | Admitting: Cardiovascular Disease

## 2022-06-26 VITALS — BP 106/64 | HR 88 | Ht 72.0 in | Wt 215.0 lb

## 2022-06-26 DIAGNOSIS — I4892 Unspecified atrial flutter: Secondary | ICD-10-CM | POA: Diagnosis not present

## 2022-06-26 NOTE — Patient Instructions (Signed)
Medication Instructions:  Your physician recommends that you continue on your current medications as directed. Please refer to the Current Medication list given to you today. *If you need a refill on your cardiac medications before your next appointment, please call your pharmacy*   Testing/Procedures: Atrial Flutter Ablation Your physician has recommended that you have an ablation. Catheter ablation is a medical procedure used to treat some cardiac arrhythmias (irregular heartbeats). During catheter ablation, a long, thin, flexible tube is put into a blood vessel in your groin (upper thigh), or neck. This tube is called an ablation catheter. It is then guided to your heart through the blood vessel. Radio frequency waves destroy small areas of heart tissue where abnormal heartbeats may cause an arrhythmia to start. Please see the instruction sheet given to you today.  You are scheduled for  Atrial Flutter Ablation  on Wednesday, October 30 with Dr. Halford Chessman.Please arrive at the Main Entrance A at [facility]: [address] [place], [place] [address] at 11:30 AM     Follow-Up: At [facility], you and your health needs are our priority.  As part of our continuing mission to provide you with exceptional heart care, we have created designated Provider Care Teams.  These Care Teams include your primary Cardiologist (physician) and Advanced Practice Providers (APPs -  Physician Assistants and Nurse Practitioners) who all work together to provide you with the care you need, when you need it.  We recommend signing up for the patient portal called "MyChart".  Sign up information is provided on this After Visit Summary.  MyChart is used to connect with patients for Virtual Visits (Telemedicine).  Patients are able to view lab/test results, encounter notes, upcoming appointments, etc.  Non-urgent messages can be sent to your provider as well.   To learn more about what you can do with  MyChart, go to [email or web address].    Your next appointment:   Friday, 11/27/22  Provider:   York Pellant, MD

## 2022-06-26 NOTE — Telephone Encounter (Signed)
Noted. Sent an urgent message to EP to get pts scheduled. Waiting on a response for an EP appointment. Lmom for pts daughter to call back and discuss lab results.

## 2022-06-26 NOTE — Progress Notes (Signed)
Electrophysiology Office Note:    Date:  06/26/2022   ID:  Marc Gilbert, DOB 05/13/1930, MRN [id number]  PCP:  Collene Mares, PA   [facility] Providers Cardiologist:  Rollene Rotunda, MD     Referring MD: Joylene Grapes, NP   History of Present Illness:    Marc Gilbert is a 89 y.o. male with a hx listed below, significant for CAD status post CABG 2018, persistent atrial fibrillation and flutter, hypertension, hyperkalemia, CKD 3, prostate cancer, COPD referred for arrhythmia management.  I reviewed notes by Drs. Hochrein and Corky Sing, NP.  He saw Dr. Herbie Baltimore in clinic on June 15, 2022.  At that time he presented with Apple Watch notifications for atrial fibrillation. According to Dr. Elissa Hefty note, the EKG showed atrial flutter, and review of strips showed only atrial flutter, and the conclusion was that the patient likely had only flutter --not fibrillation.  He was started on amiodarone and a Zio patch placed to assess the flutter chronicity.  The patch showed the patient was persistently in flutter for the duration of where, 48 hours.  The patch also showed multiple pauses, up to 6.1 seconds.  There were multiple symptomatic events associated with PVCs, but no symptoms associated with the pauses.   Past Medical History:  Diagnosis Date   Arthritis    "right hand; back" (08/30/2015)   Cancer (HCC)    skin  squamous and basal   Chronic lower back pain    CKD (chronic kidney disease), stage III (HCC)    Stage III   COPD (chronic obstructive pulmonary disease) (HCC)    no home O2   Coronary artery disease    Dysplastic colon polyp age 73   carcinoma in situ   GERD (gastroesophageal reflux disease)    occ   H/O hiatal hernia    Hyperlipidemia    Hypertension    Prostate cancer (HCC) 07/2014   active surveillance, Glisson 6    Past Surgical History:  Procedure Laterality Date   CARDIAC CATHETERIZATION     CATARACT EXTRACTION W/ INTRAOCULAR  LENS IMPLANT Left 08/27/2015   CORONARY ARTERY BYPASS GRAFT N/A 08/14/2016   Procedure: CORONARY ARTERY BYPASS GRAFTING (CABG)x2, using left internal mammary artery and right greater saphenous vein harvested endoscopically;  Surgeon: Alleen Borne, MD;  Location: [facility] OR;  Service: Open Heart Surgery;  Laterality: N/A;   JOINT REPLACEMENT     LEFT HEART CATH AND CORONARY ANGIOGRAPHY N/A 08/11/2016   Procedure: Left Heart Cath and Coronary Angiography;  Surgeon: Lyn Records, MD;  Location: [facility] INVASIVE CV LAB;  Service: Cardiovascular;  Laterality: N/A;   MOHS SURGERY     multiple SCC   PROSTATE BIOPSY  07/18/2014   TEE WITHOUT CARDIOVERSION N/A 08/14/2016   Procedure: TRANSESOPHAGEAL ECHOCARDIOGRAM (TEE);  Surgeon: Alleen Borne, MD;  Location: [facility] OR;  Service: Open Heart Surgery;  Laterality: N/A;   TONSILLECTOMY     TOTAL HIP ARTHROPLASTY  05/26/2011   Procedure: TOTAL HIP ARTHROPLASTY;  Surgeon: Valeria Batman, MD;  Location: [facility] OR;  Service: Orthopedics;  Laterality: Right;   TOTAL HIP ARTHROPLASTY Left 12/25/2021   Procedure: LEFT TOTAL HIP ARTHROPLASTY ANTERIOR APPROACH;  Surgeon: Kathryne Hitch, MD;  Location: [facility] ORS;  Service: Orthopedics;  Laterality: Left;   TOTAL SHOULDER ARTHROPLASTY Right 06/18/2020   Procedure: TOTAL SHOULDER ARTHROPLASTY;  Surgeon: Teryl Lucy, MD;  Location: [facility] ORS;  Service: Orthopedics;  Laterality: Right;   ULTRASOUND GUIDANCE  FOR VASCULAR ACCESS  08/11/2016   Procedure: Ultrasound Guidance For Vascular Access;  Surgeon: Lyn Records, MD;  Location: [facility] INVASIVE CV LAB;  Service: Cardiovascular;;    Current Medications: Current Meds  Medication Sig   amiodarone (PACERONE) 200 MG tablet Take 2 tablets (400 mg total) by mouth 2 (two) times daily for 3 days, THEN 1 tablet (200 mg total) 2 (two) times daily for 19 days.   amoxicillin (AMOXIL) 500 MG tablet Take 2,000 mg by mouth See admin instructions. Take prior to dental visits.    apixaban (ELIQUIS) 5 MG TABS tablet Take 1 tablet (5 mg total) by mouth 2 (two) times daily.   atorvastatin (LIPITOR) 40 MG tablet Take 1 tablet (40 mg total) by mouth daily. (Patient taking differently: Take 40 mg by mouth at bedtime.)   diclofenac Sodium (VOLTAREN) 1 % GEL Apply 1 application topically daily.   docusate sodium (COLACE) 250 MG capsule Take 250 mg by mouth daily.   furosemide (LASIX) 20 MG tablet Take 1 tablet (20 mg total) by mouth daily. As needed. (Patient taking differently: Take 20 mg by mouth daily. Take 40 mg for 3 days, then resume 20 mg daily.)   gabapentin (NEURONTIN) 300 MG capsule 2 capsules Orally three times a day for 90 days   Melatonin 10 MG TABS Take 10 mg by mouth at bedtime.   Multiple Vitamins-Minerals (ONE-A-DAY MENS 50+ ADVANTAGE) TABS Take 1 tablet by mouth daily with breakfast.   Omega-3 Fatty Acids (FISH OIL OMEGA-3 PO) Take 2 capsules by mouth daily. 980 mg per cap   oxybutynin (DITROPAN) 5 MG tablet Take 5 mg by mouth at bedtime.   polyethylene glycol (MIRALAX / GLYCOLAX) 17 g packet Take 8.5 g by mouth daily in the afternoon.   sodium chloride (OCEAN) 0.65 % SOLN nasal spray Place 1 spray into both nostrils 4 (four) times daily.   Tamsulosin HCl (FLOMAX) 0.4 MG CAPS Take 0.4 mg by mouth at bedtime.     Allergies:   Patient has no known allergies.   Social and Family History: Reviewed in Epic  ROS:   Please see the history of present illness.    All other systems reviewed and are negative.  EKGs/Labs/Other Studies Reviewed Today:    Echocardiogram:  Ordered    Monitors:  Zio 48hr 05/2022 - my interpretation 100% atrial flutter, V-rates 30-133, avg 73 32 pauses occurred, the longest was 6.1 seconds at about 3:30 PM. Pauses appeared to occur either in the early morning or later afternoon.  Stress testing:   Advanced imaging:   Cardiac catherization   EKG:  Last EKG results: today - typical flutter with controlled ventricular  rates   Recent Labs: 06/23/2022: ALT 32; BUN 32; Creatinine, Ser 1.52; Hemoglobin 13.8; Platelets 169; Potassium 5.2; Sodium 141; TSH 10.200     Physical Exam:    VS:  BP 106/64   Pulse 88   Ht 6' (1.829 m)   Wt 215 lb (97.5 kg)   SpO2 94%   BMI 29.16 kg/m     Wt Readings from Last 3 Encounters:  06/26/22 215 lb (97.5 kg)  06/23/22 224 lb 3.2 oz (101.7 kg)  06/15/22 222 lb 9.6 oz (101 kg)     GEN:  Well nourished, well developed in no acute distress CARDIAC: iRRR, no murmurs, rubs, gallops RESPIRATORY:  Normal work of breathing MUSCULOSKELETAL: no edema    ASSESSMENT & PLAN:    Atrial flutter Would recommend ablation for rhythm control  rather than long-term amiodarone, particularly in the absence of documented atrial fibrillation I am going to go and schedule him for an ablation, but the earliest available date is 6 months out.  Flutter ablation relatively is low risk and reasonable despite advanced age May need to consider alternative to amidoarone with TSH elevation.  Repeat testing is ordered  We discussed the indication, rationale, logistics, anticipated benefits, and potential risks of the ablation procedure including but not limited to -- bleed at the groin access site, chest pain, damage to nearby organs such as the diaphragm, lungs, or esophagus, need for a drainage tube, or prolonged hospitalization. I explained that the risk for stroke, heart attack, need for open chest surgery, or even death is very low but not zero. he  expressed understanding and wishes to proceed.   Pauses These were pauses in the AV node that occurred during flutter, were asymptomatic I suspect these occurred during sleep  Currently, there is no indication for pacemaker  Secondary hypercoagulable state Continue apixaban 5  Acute on chronic CHFpEF Diastolic dysfunction with loss of AV synchrony Patient will benefit from restoration of sinus rhythm Since last imaging was 2018, and he has  a new arrhythmia, I think repeat TTE is warranted. Will order today  I will see him in clinic again prior to the scheduled ablation          Medication Adjustments/Labs and Tests Ordered: Current medicines are reviewed at length with the patient today.  Concerns regarding medicines are outlined above.  Orders Placed This Encounter  Procedures   EKG 12-Lead   No orders of the defined types were placed in this encounter.    Signed, Maurice Small, MD  06/26/2022 10:11 AM    [facility]

## 2022-06-26 NOTE — Addendum Note (Signed)
Addended by: Sherle Poe R on: 06/26/2022 10:42 AM   Modules accepted: Orders

## 2022-07-02 ENCOUNTER — Encounter: Payer: Self-pay | Admitting: Physician Assistant

## 2022-07-02 ENCOUNTER — Ambulatory Visit (INDEPENDENT_AMBULATORY_CARE_PROVIDER_SITE_OTHER): Payer: Medicare Other | Admitting: Physician Assistant

## 2022-07-02 ENCOUNTER — Telehealth: Payer: Self-pay | Admitting: Cardiology

## 2022-07-02 ENCOUNTER — Other Ambulatory Visit: Payer: Self-pay

## 2022-07-02 DIAGNOSIS — Z96642 Presence of left artificial hip joint: Secondary | ICD-10-CM

## 2022-07-02 DIAGNOSIS — N183 Chronic kidney disease, stage 3 unspecified: Secondary | ICD-10-CM | POA: Diagnosis not present

## 2022-07-02 DIAGNOSIS — E785 Hyperlipidemia, unspecified: Secondary | ICD-10-CM | POA: Diagnosis not present

## 2022-07-02 DIAGNOSIS — I2510 Atherosclerotic heart disease of native coronary artery without angina pectoris: Secondary | ICD-10-CM | POA: Diagnosis not present

## 2022-07-02 DIAGNOSIS — M1712 Unilateral primary osteoarthritis, left knee: Secondary | ICD-10-CM

## 2022-07-02 DIAGNOSIS — I4819 Other persistent atrial fibrillation: Secondary | ICD-10-CM | POA: Diagnosis not present

## 2022-07-02 DIAGNOSIS — Z79899 Other long term (current) drug therapy: Secondary | ICD-10-CM | POA: Diagnosis not present

## 2022-07-02 DIAGNOSIS — M1612 Unilateral primary osteoarthritis, left hip: Secondary | ICD-10-CM

## 2022-07-02 DIAGNOSIS — Z86718 Personal history of other venous thrombosis and embolism: Secondary | ICD-10-CM | POA: Diagnosis not present

## 2022-07-02 DIAGNOSIS — R7989 Other specified abnormal findings of blood chemistry: Secondary | ICD-10-CM | POA: Diagnosis not present

## 2022-07-02 DIAGNOSIS — I10 Essential (primary) hypertension: Secondary | ICD-10-CM | POA: Diagnosis not present

## 2022-07-02 MED ORDER — METHYLPREDNISOLONE ACETATE 40 MG/ML IJ SUSP
40.0000 mg | INTRAMUSCULAR | Status: AC | PRN
Start: 2022-07-02 — End: 2022-07-02
  Administered 2022-07-02: 40 mg via INTRA_ARTICULAR

## 2022-07-02 MED ORDER — LIDOCAINE HCL 1 % IJ SOLN
3.0000 mL | INTRAMUSCULAR | Status: AC | PRN
Start: 2022-07-02 — End: 2022-07-02
  Administered 2022-07-02: 3 mL

## 2022-07-02 NOTE — Telephone Encounter (Signed)
Spoke to patient .  Patient stated  Dr Nelly Laurence informed him that Irving Burton would schedule a cardioversion.  He states he is ready to schedule. Patient would prefer an afternoon appointment for cardioversion since he lives in [place] .   Patient aware will route to [facility] for response

## 2022-07-02 NOTE — Progress Notes (Signed)
HPI: Mr. Albracht comes in today status post left total hip arthroplasty 12/25/2021.  He states his hip does not bother him at all.  He does have some numbness to touch about the incision site.  States he is using a cane due to right knee pain.  He has had no injury to the knee.  He has known osteoarthritis knee with bone-on-bone medial compartment.  Patient denies any fevers chills.  He is not diabetic.  Review of systems: See HPI otherwise negative  Physical exam: General well-developed well-nourished male no acute distress mood and affect. Left hip: Good range of motion hip without pain.  Left calf supple nontender dorsiflexion plantarflexion left ankle intact. Right knee no abnormal warmth erythema.  Overall good range of motion.  Patellofemoral crepitus.  Impression: Status post left total hip arthroplasty Right knee osteoarthritis  Plan: Will see him back at 1 year postop and obtain an AP pelvis and lateral view of the left hip.  Regards to his right knee which is bothering him he has had no treatment.  He is using a cane in the brace and continue to use these.  Offered him a cortisone injection and he was agreeable.  Questions were encouraged and answered.     Procedure Note  Patient: OSHEN SERRATORE             Date of Birth: 20-Nov-1930           MRN: [id number]             Visit Date: 07/02/2022  Procedures: Visit Diagnoses:  1. Primary osteoarthritis of left hip   2. Status post total replacement of left hip   3. Primary osteoarthritis of left knee     Large Joint Inj: L knee on 07/02/2022 4:28 PM Indications: pain Details: 22 G 1.5 in needle, anterolateral approach  Arthrogram: No  Medications: 3 mL lidocaine 1 %; 40 mg methylPREDNISolone acetate 40 MG/ML Outcome: tolerated well, no immediate complications Procedure, treatment alternatives, risks and benefits explained, specific risks discussed. Consent was given by the patient. Immediately prior to procedure a time out was  called to verify the correct patient, procedure, equipment, support staff and site/side marked as required. Patient was prepped and draped in the usual sterile fashion.

## 2022-07-02 NOTE — Telephone Encounter (Signed)
Patient wants to know when he will be scheduled for his cardioversion.

## 2022-07-03 LAB — BASIC METABOLIC PANEL: EGFR: 47

## 2022-07-03 NOTE — Telephone Encounter (Signed)
  I don't see anything about a cardioversion. He saw Dr. Nelly Laurence who recommended ablation? Would recommend follow-up with EP. Thanks, Em

## 2022-07-03 NOTE — Telephone Encounter (Signed)
Called patient . Informed patient of Marc Person NP commented - not being aware of cardioversion needed.  Patient states he remember Dr Nelly Laurence or someone mentioning a cardioversion.  RN informed patient  will send a message to Dr Nelly Laurence and his nurse  for clarification   Patient voice understanding

## 2022-07-03 NOTE — Telephone Encounter (Signed)
Attempted to contact patient, no answer and unable to leave message.   Dr Nelly Laurence did not order a cardioversion for him. We have him scheduled for an echo on 07/23/22 and an ablation on 10/30. No DCCV ordered at this time.

## 2022-07-04 ENCOUNTER — Other Ambulatory Visit: Payer: Self-pay | Admitting: Cardiology

## 2022-07-06 ENCOUNTER — Other Ambulatory Visit: Payer: Self-pay

## 2022-07-14 NOTE — Telephone Encounter (Signed)
Spoke with patient, metoprolol was discontinued by Bernadene Person NP on 06/23/22 due to bradycardia - patients heart rate currently 70 while on the phone. Patient denies any symptoms at this time (SOB, chest pain, dizziness, palpitations), also denies any recent history of elevated heart rate. Instructed patient to monitor heart rate over the next several days and to contact our office if he notices resting heart rates above 100. Patient already has follow up appointment scheduled on 6/21 with Dr Nelly Laurence.

## 2022-07-20 DIAGNOSIS — H02831 Dermatochalasis of right upper eyelid: Secondary | ICD-10-CM | POA: Diagnosis not present

## 2022-07-20 DIAGNOSIS — H02834 Dermatochalasis of left upper eyelid: Secondary | ICD-10-CM | POA: Diagnosis not present

## 2022-07-20 DIAGNOSIS — H02403 Unspecified ptosis of bilateral eyelids: Secondary | ICD-10-CM | POA: Diagnosis not present

## 2022-07-23 ENCOUNTER — Ambulatory Visit (HOSPITAL_COMMUNITY): Payer: Medicare Other | Attending: Cardiovascular Disease

## 2022-07-23 DIAGNOSIS — I4892 Unspecified atrial flutter: Secondary | ICD-10-CM | POA: Diagnosis not present

## 2022-07-23 LAB — ECHOCARDIOGRAM COMPLETE
Area-P 1/2: 8.29 cm2
S' Lateral: 3 cm

## 2022-07-24 ENCOUNTER — Institutional Professional Consult (permissible substitution): Payer: Medicare Other | Admitting: Plastic Surgery

## 2022-08-02 ENCOUNTER — Encounter: Payer: Self-pay | Admitting: Plastic Surgery

## 2022-08-03 ENCOUNTER — Telehealth: Payer: Self-pay

## 2022-08-03 ENCOUNTER — Telehealth: Payer: Self-pay | Admitting: *Deleted

## 2022-08-03 NOTE — Telephone Encounter (Signed)
Spoke with Lane Hacker, she is faxing the reading of the VFT that was performed on 07/20/2022.  Reguested for Joss to add on spread sheet.

## 2022-08-03 NOTE — Telephone Encounter (Signed)
VFT received from [facility]. Sent to batch scan. Copy sent to [facility] and [employer]

## 2022-08-05 DIAGNOSIS — N401 Enlarged prostate with lower urinary tract symptoms: Secondary | ICD-10-CM | POA: Diagnosis not present

## 2022-08-05 DIAGNOSIS — R351 Nocturia: Secondary | ICD-10-CM | POA: Diagnosis not present

## 2022-08-05 DIAGNOSIS — E349 Endocrine disorder, unspecified: Secondary | ICD-10-CM | POA: Diagnosis not present

## 2022-08-05 DIAGNOSIS — Z8546 Personal history of malignant neoplasm of prostate: Secondary | ICD-10-CM | POA: Diagnosis not present

## 2022-08-07 ENCOUNTER — Encounter: Payer: Self-pay | Admitting: Cardiovascular Disease

## 2022-08-07 ENCOUNTER — Encounter: Payer: Self-pay | Admitting: Cardiology

## 2022-08-07 ENCOUNTER — Ambulatory Visit: Payer: Medicare Other | Attending: Cardiovascular Disease | Admitting: Cardiovascular Disease

## 2022-08-07 VITALS — BP 132/72 | HR 109 | Ht 72.0 in | Wt 224.2 lb

## 2022-08-07 DIAGNOSIS — R310 Gross hematuria: Secondary | ICD-10-CM | POA: Diagnosis not present

## 2022-08-07 DIAGNOSIS — I4892 Unspecified atrial flutter: Secondary | ICD-10-CM | POA: Insufficient documentation

## 2022-08-07 DIAGNOSIS — N3000 Acute cystitis without hematuria: Secondary | ICD-10-CM | POA: Diagnosis not present

## 2022-08-07 NOTE — H&P (View-Only) (Signed)
Electrophysiology Office Note:    Date:  08/07/2022   ID:  Marc Gilbert, DOB 06/28/1930, MRN [id number]  PCP:  Miller, Virginia E, PA   [facility] Providers Cardiologist:  James Hochrein, MD Electrophysiologist:  Jaylianna Tatlock E Alfio Loescher, MD     Referring MD: Miller, Virginia E, PA   History of Present Illness:    Marc Gilbert is a 89 y.o. male with a hx listed below, significant for CAD status post CABG 2018, persistent atrial fibrillation and flutter, hypertension, hyperkalemia, CKD 3, prostate cancer, COPD referred for arrhythmia management.  I reviewed notes by Drs. Hochrein and Harding, Emily Monge, NP.  He saw Dr. Harding in clinic on June 15, 2022.  At that time he presented with Apple Watch notifications for atrial fibrillation. According to Dr. Harding's note, the EKG showed atrial flutter, and review of strips showed only atrial flutter, and the conclusion was that the patient likely had only flutter --not fibrillation.  He was started on amiodarone and a Zio patch placed to assess the flutter chronicity.  The patch showed the patient was persistently in flutter for the duration of where, 48 hours.  The patch also showed multiple pauses, up to 6.1 seconds.  There were multiple symptomatic events associated with PVCs, but no symptoms associated with the pauses.  Since his last visit, he has been doing reasonably well. He remains in flutter, with fatigue, shortness of breath, and some ankle swelling. He recently began to have some hematuria and was diagnosed with cystitis. He is starting antibiotics.  Past Medical History:  Diagnosis Date   Arthritis    "right hand; back" (08/30/2015)   Cancer (HCC)    skin  squamous and basal   Chronic lower back pain    CKD (chronic kidney disease), stage III (HCC)    Stage III   COPD (chronic obstructive pulmonary disease) (HCC)    no home O2   Coronary artery disease    Dysplastic colon polyp age 53   carcinoma in situ   GERD  (gastroesophageal reflux disease)    occ   H/O hiatal hernia    Hyperlipidemia    Hypertension    Prostate cancer (HCC) 07/2014   active surveillance, Glisson 6    Past Surgical History:  Procedure Laterality Date   CARDIAC CATHETERIZATION     CATARACT EXTRACTION W/ INTRAOCULAR LENS IMPLANT Left 08/27/2015   CORONARY ARTERY BYPASS GRAFT N/A 08/14/2016   Procedure: CORONARY ARTERY BYPASS GRAFTING (CABG)x2, using left internal mammary artery and right greater saphenous vein harvested endoscopically;  Surgeon: Bartle, Bryan K, MD;  Location: [facility] OR;  Service: Open Heart Surgery;  Laterality: N/A;   JOINT REPLACEMENT     LEFT HEART CATH AND CORONARY ANGIOGRAPHY N/A 08/11/2016   Procedure: Left Heart Cath and Coronary Angiography;  Surgeon: Smith, Henry W, MD;  Location: [facility] INVASIVE CV LAB;  Service: Cardiovascular;  Laterality: N/A;   MOHS SURGERY     multiple SCC   PROSTATE BIOPSY  07/18/2014   TEE WITHOUT CARDIOVERSION N/A 08/14/2016   Procedure: TRANSESOPHAGEAL ECHOCARDIOGRAM (TEE);  Surgeon: Bartle, Bryan K, MD;  Location: [facility] OR;  Service: Open Heart Surgery;  Laterality: N/A;   TONSILLECTOMY     TOTAL HIP ARTHROPLASTY  05/26/2011   Procedure: TOTAL HIP ARTHROPLASTY;  Surgeon: Peter W Whitfield, MD;  Location: [facility] OR;  Service: Orthopedics;  Laterality: Right;   TOTAL HIP ARTHROPLASTY Left 12/25/2021   Procedure: LEFT TOTAL HIP ARTHROPLASTY ANTERIOR APPROACH;  Surgeon: Blackman,   Christopher Y, MD;  Location: [facility] ORS;  Service: Orthopedics;  Laterality: Left;   TOTAL SHOULDER ARTHROPLASTY Right 06/18/2020   Procedure: TOTAL SHOULDER ARTHROPLASTY;  Surgeon: Landau, Joshua, MD;  Location: [facility] ORS;  Service: Orthopedics;  Laterality: Right;   ULTRASOUND GUIDANCE FOR VASCULAR ACCESS  08/11/2016   Procedure: Ultrasound Guidance For Vascular Access;  Surgeon: Smith, Henry W, MD;  [facility]: [facility] INVAS[facility] LAB;  Service: Cardiovascular;;    Current Medications: Current Meds  Medication Sig    acetaminophen (TYLENOL) 500 MG tablet Take 1,000 mg by mouth every 6 (six) hours as needed for moderate pain or mild pain (Knee and back).   amoxicillin (AMOXIL) 500 MG tablet Take 2,000 mg by mouth See admin instructions. Take prior to dental visits.   apixaban (ELIQUIS) 5 MG TABS tablet Take 1 tablet (5 mg total) by mouth 2 (two) times daily.   aspirin EC 81 MG tablet Take 81 mg by mouth daily. Swallow whole.   atorvastatin (LIPITOR) 40 MG tablet Take 1 tablet (40 mg total) by mouth daily. (Patient taking differently: Take 40 mg by mouth at bedtime.)   diclofenac Sodium (VOLTAREN) 1 % GEL Apply 1 application  topically daily as needed (pain).   docusate sodium (COLACE) 250 MG capsule Take 250 mg by mouth daily.   furosemide (LASIX) 20 MG tablet Take 1 tablet (20 mg total) by mouth daily. As needed. (Patient taking differently: Take 20 mg by mouth daily. Take 40 mg for 3 days, then resume 20 mg daily.)   gabapentin (NEURONTIN) 300 MG capsule Take 300 mg by mouth 6 (six) times daily.   Glycerin-Hypromellose-PEG 400 (DRY EYE RELIEF DROPS) 0.2-0.2-1 % SOLN Place 1 drop into both eyes daily as needed (Dry eye).   Melatonin 10 MG TABS Take 10 mg by mouth at bedtime.   metoprolol succinate (TOPROL-XL) 50 MG 24 hr tablet Take 50 mg by mouth daily. Take with or immediately following a meal.   Multiple Vitamins-Minerals (ONE-A-DAY MENS 50+ ADVANTAGE) TABS Take 1 tablet by mouth daily with breakfast.   Omega-3 Fatty Acids (FISH OIL OMEGA-3 PO) Take 2 capsules by mouth daily. 980 mg per cap   oxybutynin (DITROPAN) 5 MG tablet Take 5 mg by mouth at bedtime.   polyethylene glycol (MIRALAX / GLYCOLAX) 17 g packet Take 8.5 g by mouth daily as needed for mild constipation or moderate constipation.   sodium chloride (OCEAN) 0.65 % SOLN nasal spray Place 1 spray into both nostrils 4 (four) times daily.   Tamsulosin HCl (FLOMAX) 0.4 MG CAPS Take 0.4 mg by mouth at bedtime.   testosterone cypionate (DEPOTESTOSTERONE  CYPIONATE) 200 MG/ML injection Inject 100 mg into the muscle once a week.     Allergies:   Patient has no known allergies.   Social and Family History: Reviewed in Epic  ROS:   Please see the history of present illness.    All other systems reviewed and are negative.  EKGs/Labs/Other Studies Reviewed Today:    Echocardiogram:  Ordered    Monitors:  Zio 48hr 05/2022 - my interpretation 100% atrial flutter, V-rates 30-133, avg 73 32 pauses occurred, the longest was 6.1 seconds at about 3:30 PM. Pauses appeared to occur either in the early morning or later afternoon.  Stress testing:   Advanced imaging:   Cardiac catherization   EKG:  Last EKG results: today - typical flutter with controlled ventricular rates   Recent Labs: 06/23/2022: ALT 32; BUN 32; Creatinine, Ser 1.52; Hemoglobin 13.8; Platelets 169;   Potassium 5.2; Sodium 141; TSH 10.200     Physical Exam:    VS:  BP 132/72   Pulse (!) 109   Ht 6' (1.829 m)   Wt 224 lb 3.2 oz (101.7 kg)   SpO2 93%   BMI 30.41 kg/m     Wt Readings from Last 3 Encounters:  08/07/22 224 lb 3.2 oz (101.7 kg)  06/26/22 215 lb (97.5 kg)  06/23/22 224 lb 3.2 oz (101.7 kg)     GEN:  Well nourished, well developed in no acute distress CARDIAC: iRRR, no murmurs, rubs, gallops RESPIRATORY:  Normal work of breathing MUSCULOSKELETAL: no edema    ASSESSMENT & PLAN:    Atrial flutter Would recommend ablation for rhythm control rather than long-term amiodarone, particularly in the absence of documented atrial fibrillation Will proceed with flutter ablation next week as scheduled. Flutter ablation relatively is low risk and reasonable despite advanced age May need to consider alternative to amiodarone with TSH elevation.  Repeat testing is ordered  We discussed the indication, rationale, logistics, anticipated benefits, and potential risks of the ablation procedure including but not limited to -- bleed at the groin access site,  chest pain, damage to nearby organs such as the diaphragm, lungs, or esophagus, need for a drainage tube, or prolonged hospitalization. I explained that the risk for stroke, heart attack, need for open chest surgery, or even death is very low but not zero. he  expressed understanding and wishes to proceed.   Pauses These were pauses in the AV node that occurred during flutter, were asymptomatic I suspect these occurred during sleep  Currently, there is no indication for pacemaker  Secondary hypercoagulable state Continue apixaban 5  Acute on chronic CHFpEF Diastolic dysfunction with loss of AV synchrony Patient will benefit from restoration of sinus rhythm Since last imaging was 2018, and he has a new arrhythmia, I think repeat TTE is warranted. Will order today            Medication Adjustments/Labs and Tests Ordered: Current medicines are reviewed at length with the patient today.  Concerns regarding medicines are outlined above.  Orders Placed This Encounter  Procedures   EKG 12-Lead   No orders of the defined types were placed in this encounter.    Signed, Harshaan Whang E Aneesha Holloran, MD  08/07/2022 3:28 PM    [facility] 

## 2022-08-07 NOTE — Progress Notes (Signed)
Electrophysiology Office Note:    Date:  08/07/2022   ID:  Marc Gilbert, DOB 25-Feb-1930, MRN [id number]  PCP:  Collene Mares, PA   [facility] Providers Cardiologist:  Rollene Rotunda, MD Electrophysiologist:  Maurice Small, MD     Referring MD: Hyacinth Meeker, [place], [place]   History of Present Illness:    Marc Gilbert is a 89 y.o. male with a hx listed below, significant for CAD status post CABG 2018, persistent atrial fibrillation and flutter, hypertension, hyperkalemia, CKD 3, prostate cancer, COPD referred for arrhythmia management.  I reviewed notes by Drs. Hochrein and Corky Sing, NP.  He saw Dr. Herbie Baltimore in clinic on June 15, 2022.  At that time he presented with Apple Watch notifications for atrial fibrillation. According to Dr. Elissa Hefty note, the EKG showed atrial flutter, and review of strips showed only atrial flutter, and the conclusion was that the patient likely had only flutter --not fibrillation.  He was started on amiodarone and a Zio patch placed to assess the flutter chronicity.  The patch showed the patient was persistently in flutter for the duration of where, 48 hours.  The patch also showed multiple pauses, up to 6.1 seconds.  There were multiple symptomatic events associated with PVCs, but no symptoms associated with the pauses.  Since his last visit, he has been doing reasonably well. He remains in flutter, with fatigue, shortness of breath, and some ankle swelling. He recently began to have some hematuria and was diagnosed with cystitis. He is starting antibiotics.  Past Medical History:  Diagnosis Date   Arthritis    "right hand; back" (08/30/2015)   Cancer (HCC)    skin  squamous and basal   Chronic lower back pain    CKD (chronic kidney disease), stage III (HCC)    Stage III   COPD (chronic obstructive pulmonary disease) (HCC)    no home O2   Coronary artery disease    Dysplastic colon polyp age 29   carcinoma in situ   GERD  (gastroesophageal reflux disease)    occ   H/O hiatal hernia    Hyperlipidemia    Hypertension    Prostate cancer (HCC) 07/2014   active surveillance, Glisson 6    Past Surgical History:  Procedure Laterality Date   CARDIAC CATHETERIZATION     CATARACT EXTRACTION W/ INTRAOCULAR LENS IMPLANT Left 08/27/2015   CORONARY ARTERY BYPASS GRAFT N/A 08/14/2016   Procedure: CORONARY ARTERY BYPASS GRAFTING (CABG)x2, using left internal mammary artery and right greater saphenous vein harvested endoscopically;  Surgeon: Alleen Borne, MD;  Location: [facility] OR;  Service: Open Heart Surgery;  Laterality: N/A;   JOINT REPLACEMENT     LEFT HEART CATH AND CORONARY ANGIOGRAPHY N/A 08/11/2016   Procedure: Left Heart Cath and Coronary Angiography;  Surgeon: Lyn Records, MD;  Location: [facility] INVASIVE CV LAB;  Service: Cardiovascular;  Laterality: N/A;   MOHS SURGERY     multiple SCC   PROSTATE BIOPSY  07/18/2014   TEE WITHOUT CARDIOVERSION N/A 08/14/2016   Procedure: TRANSESOPHAGEAL ECHOCARDIOGRAM (TEE);  Surgeon: Alleen Borne, MD;  Location: [facility] OR;  Service: Open Heart Surgery;  Laterality: N/A;   TONSILLECTOMY     TOTAL HIP ARTHROPLASTY  05/26/2011   Procedure: TOTAL HIP ARTHROPLASTY;  Surgeon: Valeria Batman, MD;  Location: [facility] OR;  Service: Orthopedics;  Laterality: Right;   TOTAL HIP ARTHROPLASTY Left 12/25/2021   Procedure: LEFT TOTAL HIP ARTHROPLASTY ANTERIOR APPROACH;  Surgeon: Magnus Ivan,  Vanita Panda, MD;  Location: [facility];  Service: Orthopedics;  Laterality: Left;   TOTAL SHOULDER ARTHROPLASTY Right 06/18/2020   Procedure: TOTAL SHOULDER ARTHROPLASTY;  Surgeon: Teryl Lucy, MD;  Location: [facility];  Service: Orthopedics;  Laterality: Right;   ULTRASOUND GUIDANCE FOR VASCULAR ACCESS  08/11/2016   Procedure: Ultrasound Guidance For Vascular Access;  Surgeon: Lyn Records, MD;  Location: [facility] INVASIVE CV LAB;  Service: Cardiovascular;;    Current Medications: Current Meds  Medication Sig    acetaminophen (TYLENOL) 500 MG tablet Take 1,000 mg by mouth every 6 (six) hours as needed for moderate pain or mild pain (Knee and back).   amoxicillin (AMOXIL) 500 MG tablet Take 2,000 mg by mouth See admin instructions. Take prior to dental visits.   apixaban (ELIQUIS) 5 MG TABS tablet Take 1 tablet (5 mg total) by mouth 2 (two) times daily.   aspirin EC 81 MG tablet Take 81 mg by mouth daily. Swallow whole.   atorvastatin (LIPITOR) 40 MG tablet Take 1 tablet (40 mg total) by mouth daily. (Patient taking differently: Take 40 mg by mouth at bedtime.)   diclofenac Sodium (VOLTAREN) 1 % GEL Apply 1 application  topically daily as needed (pain).   docusate sodium (COLACE) 250 MG capsule Take 250 mg by mouth daily.   furosemide (LASIX) 20 MG tablet Take 1 tablet (20 mg total) by mouth daily. As needed. (Patient taking differently: Take 20 mg by mouth daily. Take 40 mg for 3 days, then resume 20 mg daily.)   gabapentin (NEURONTIN) 300 MG capsule Take 300 mg by mouth 6 (six) times daily.   Glycerin-Hypromellose-PEG 400 (DRY EYE RELIEF DROPS) 0.2-0.2-1 % SOLN Place 1 drop into both eyes daily as needed (Dry eye).   Melatonin 10 MG TABS Take 10 mg by mouth at bedtime.   metoprolol succinate (TOPROL-XL) 50 MG 24 hr tablet Take 50 mg by mouth daily. Take with or immediately following a meal.   Multiple Vitamins-Minerals (ONE-A-DAY MENS 50+ ADVANTAGE) TABS Take 1 tablet by mouth daily with breakfast.   Omega-3 Fatty Acids (FISH OIL OMEGA-3 PO) Take 2 capsules by mouth daily. 980 mg per cap   oxybutynin (DITROPAN) 5 MG tablet Take 5 mg by mouth at bedtime.   polyethylene glycol (MIRALAX / GLYCOLAX) 17 g packet Take 8.5 g by mouth daily as needed for mild constipation or moderate constipation.   sodium chloride (OCEAN) 0.65 % SOLN nasal spray Place 1 spray into both nostrils 4 (four) times daily.   Tamsulosin HCl (FLOMAX) 0.4 MG CAPS Take 0.4 mg by mouth at bedtime.   testosterone cypionate (DEPOTESTOSTERONE  CYPIONATE) 200 MG/ML injection Inject 100 mg into the muscle once a week.     Allergies:   Patient has no known allergies.   Social and Family History: Reviewed in Epic  ROS:   Please see the history of present illness.    All other systems reviewed and are negative.  EKGs/Labs/Other Studies Reviewed Today:    Echocardiogram:  Ordered    Monitors:  Zio 48hr 05/2022 - my interpretation 100% atrial flutter, V-rates 30-133, avg 73 32 pauses occurred, the longest was 6.1 seconds at about 3:30 PM. Pauses appeared to occur either in the early morning or later afternoon.  Stress testing:   Advanced imaging:   Cardiac catherization   EKG:  Last EKG results: today - typical flutter with controlled ventricular rates   Recent Labs: 06/23/2022: ALT 32; BUN 32; Creatinine, Ser 1.52; Hemoglobin 13.8; Platelets 169;  Potassium 5.2; Sodium 141; TSH 10.200     Physical Exam:    VS:  BP 132/72   Pulse (!) 109   Ht 6' (1.829 m)   Wt 224 lb 3.2 oz (101.7 kg)   SpO2 93%   BMI 30.41 kg/m     Wt Readings from Last 3 Encounters:  08/07/22 224 lb 3.2 oz (101.7 kg)  06/26/22 215 lb (97.5 kg)  06/23/22 224 lb 3.2 oz (101.7 kg)     GEN:  Well nourished, well developed in no acute distress CARDIAC: iRRR, no murmurs, rubs, gallops RESPIRATORY:  Normal work of breathing MUSCULOSKELETAL: no edema    ASSESSMENT & PLAN:    Atrial flutter Would recommend ablation for rhythm control rather than long-term amiodarone, particularly in the absence of documented atrial fibrillation Will proceed with flutter ablation next week as scheduled. Flutter ablation relatively is low risk and reasonable despite advanced age May need to consider alternative to amiodarone with TSH elevation.  Repeat testing is ordered  We discussed the indication, rationale, logistics, anticipated benefits, and potential risks of the ablation procedure including but not limited to -- bleed at the groin access site,  chest pain, damage to nearby organs such as the diaphragm, lungs, or esophagus, need for a drainage tube, or prolonged hospitalization. I explained that the risk for stroke, heart attack, need for open chest surgery, or even death is very low but not zero. he  expressed understanding and wishes to proceed.   Pauses These were pauses in the AV node that occurred during flutter, were asymptomatic I suspect these occurred during sleep  Currently, there is no indication for pacemaker  Secondary hypercoagulable state Continue apixaban 5  Acute on chronic CHFpEF Diastolic dysfunction with loss of AV synchrony Patient will benefit from restoration of sinus rhythm Since last imaging was 2018, and he has a new arrhythmia, I think repeat TTE is warranted. Will order today            Medication Adjustments/Labs and Tests Ordered: Current medicines are reviewed at length with the patient today.  Concerns regarding medicines are outlined above.  Orders Placed This Encounter  Procedures   EKG 12-Lead   No orders of the defined types were placed in this encounter.    Signed, Maurice Small, MD  08/07/2022 3:28 PM    [facility]

## 2022-08-07 NOTE — Patient Instructions (Signed)
Medication Instructions:  Your physician recommends that you continue on your current medications as directed. Please refer to the Current Medication list given to you today. *If you need a refill on your cardiac medications before your next appointment, please call your pharmacy*   Lab Work: CBC and BMET Today If you have labs (blood work) drawn today and your tests are completely normal, you will receive your results only by: MyChart Message (if you have MyChart) OR A paper copy in the mail If you have any lab test that is abnormal or we need to change your treatment, we will call you to review the results.   Testing/Procedures: Atrial Flutter Ablation Your physician has recommended that you have an ablation. Catheter ablation is a medical procedure used to treat some cardiac arrhythmias (irregular heartbeats). During catheter ablation, a long, thin, flexible tube is put into a blood vessel in your groin (upper thigh), or neck. This tube is called an ablation catheter. It is then guided to your heart through the blood vessel. Radio frequency waves destroy small areas of heart tissue where abnormal heartbeats may cause an arrhythmia to start. Please see the instruction sheet given to you today.   Follow-Up: At [facility], you and your health needs are our priority.  As part of our continuing mission to provide you with exceptional heart care, we have created designated Provider Care Teams.  These Care Teams include your primary Cardiologist (physician) and Advanced Practice Providers (APPs -  Physician Assistants and Nurse Practitioners) who all work together to provide you with the care you need, when you need it.  We recommend signing up for the patient portal called "MyChart".  Sign up information is provided on this After Visit Summary.  MyChart is used to connect with patients for Virtual Visits (Telemedicine).  Patients are able to view lab/test results, encounter notes,  upcoming appointments, etc.  Non-urgent messages can be sent to your provider as well.   To learn more about what you can do with MyChart, go to [email or web address].    Your next appointment:   Ablation on 08/10/22 Follow up after ablation on 09/09/22 - see appointment page for details  Provider:   York Pellant, MD

## 2022-08-07 NOTE — Pre-Procedure Instructions (Signed)
Spoke with patient's wife Byrd Hesselbach,  She states patient has appt with Dr Nelly Laurence this afternoon.    Instructed patient on the following items: Arrival time 0730 Nothing to eat or drink after midnight No meds AM of procedure Responsible person to drive you home and stay with you for 24 hrs  Have you missed any doses of anti-coagulant Eliquis- should be taken twice a day, if he has missed any doses please let Dr Nelly Laurence know at appt.

## 2022-08-08 LAB — BASIC METABOLIC PANEL
BUN/Creatinine Ratio: 16 (ref 10–24)
BUN: 24 mg/dL (ref 10–36)
CO2: 24 mmol/L (ref 20–29)
Calcium: 9.1 mg/dL (ref 8.6–10.2)
Chloride: 100 mmol/L (ref 96–106)
Creatinine, Ser: 1.54 mg/dL — ABNORMAL HIGH (ref 0.76–1.27)
Glucose: 106 mg/dL — ABNORMAL HIGH (ref 70–99)
Potassium: 4.5 mmol/L (ref 3.5–5.2)
Sodium: 139 mmol/L (ref 134–144)
eGFR: 43 mL/min/{1.73_m2} — ABNORMAL LOW (ref >59)

## 2022-08-08 LAB — CBC
Hematocrit: 41.4 % (ref 37.5–51.0)
Hemoglobin: 13.9 g/dL (ref 13.0–17.7)
MCH: 31.3 pg (ref 26.6–33.0)
MCHC: 33.6 g/dL (ref 31.5–35.7)
MCV: 93 fL (ref 79–97)
Platelets: 161 10*3/uL (ref 150–450)
RBC: 4.44 x10E6/uL (ref 4.14–5.80)
RDW: 14.9 % (ref 11.6–15.4)
WBC: 7.5 10*3/uL (ref 3.4–10.8)

## 2022-08-08 LAB — TSH: TSH: 15 u[IU]/mL — ABNORMAL HIGH (ref 0.450–4.500)

## 2022-08-10 ENCOUNTER — Ambulatory Visit (HOSPITAL_COMMUNITY)
Admission: RE | Admit: 2022-08-10 | Discharge: 2022-08-10 | Disposition: A | Discharge status: Home / Self Care | Payer: Medicare Other | Attending: Cardiovascular Disease | Admitting: Cardiovascular Disease

## 2022-08-10 ENCOUNTER — Other Ambulatory Visit: Payer: Self-pay

## 2022-08-10 ENCOUNTER — Encounter (HOSPITAL_COMMUNITY)
Admission: RE | Disposition: A | Discharge status: Home / Self Care | Payer: Self-pay | Source: Home / Self Care | Attending: Cardiovascular Disease

## 2022-08-10 DIAGNOSIS — I2510 Atherosclerotic heart disease of native coronary artery without angina pectoris: Secondary | ICD-10-CM | POA: Insufficient documentation

## 2022-08-10 DIAGNOSIS — Z8546 Personal history of malignant neoplasm of prostate: Secondary | ICD-10-CM | POA: Insufficient documentation

## 2022-08-10 DIAGNOSIS — Z951 Presence of aortocoronary bypass graft: Secondary | ICD-10-CM | POA: Diagnosis not present

## 2022-08-10 DIAGNOSIS — D6869 Other thrombophilia: Secondary | ICD-10-CM | POA: Insufficient documentation

## 2022-08-10 DIAGNOSIS — J449 Chronic obstructive pulmonary disease, unspecified: Secondary | ICD-10-CM | POA: Diagnosis not present

## 2022-08-10 DIAGNOSIS — I5033 Acute on chronic diastolic (congestive) heart failure: Secondary | ICD-10-CM | POA: Diagnosis not present

## 2022-08-10 DIAGNOSIS — I130 Hypertensive heart and chronic kidney disease with heart failure and stage 1 through stage 4 chronic kidney disease, or unspecified chronic kidney disease: Secondary | ICD-10-CM | POA: Insufficient documentation

## 2022-08-10 DIAGNOSIS — N183 Chronic kidney disease, stage 3 unspecified: Secondary | ICD-10-CM | POA: Diagnosis not present

## 2022-08-10 DIAGNOSIS — Z7901 Long term (current) use of anticoagulants: Secondary | ICD-10-CM | POA: Diagnosis not present

## 2022-08-10 DIAGNOSIS — I4892 Unspecified atrial flutter: Secondary | ICD-10-CM | POA: Diagnosis not present

## 2022-08-10 DIAGNOSIS — I4819 Other persistent atrial fibrillation: Secondary | ICD-10-CM | POA: Diagnosis not present

## 2022-08-10 HISTORY — PX: A-FLUTTER ABLATION: EP1230

## 2022-08-10 SURGERY — A-FLUTTER ABLATION

## 2022-08-10 MED ORDER — FENTANYL CITRATE (PF) 100 MCG/2ML IJ SOLN
INTRAMUSCULAR | Status: AC
  Filled 2022-08-10: qty 2

## 2022-08-10 MED ORDER — MIDAZOLAM HCL 5 MG/5ML IJ SOLN
INTRAMUSCULAR | Status: DC | PRN
  Administered 2022-08-10 (×2): 1 mg via INTRAVENOUS

## 2022-08-10 MED ORDER — SODIUM CHLORIDE 0.9% FLUSH: 3.0000 mL | Freq: Two times a day (BID) | INTRAVENOUS | Status: DC

## 2022-08-10 MED ORDER — HEPARIN SODIUM (PORCINE) 1000 UNIT/ML IJ SOLN
INTRAMUSCULAR | Status: DC | PRN
  Administered 2022-08-10: 1000 [IU] via INTRAVENOUS

## 2022-08-10 MED ORDER — BUPIVACAINE HCL (PF) 0.25 % IJ SOLN
INTRAMUSCULAR | Status: AC
  Filled 2022-08-10: qty 60

## 2022-08-10 MED ORDER — BUPIVACAINE HCL (PF) 0.25 % IJ SOLN
INTRAMUSCULAR | Status: DC | PRN
  Administered 2022-08-10: 60 mL

## 2022-08-10 MED ORDER — MIDAZOLAM HCL 5 MG/5ML IJ SOLN
INTRAMUSCULAR | Status: AC
  Filled 2022-08-10: qty 5

## 2022-08-10 MED ORDER — ONDANSETRON HCL 4 MG/2ML IJ SOLN: 4.0000 mg | Freq: Four times a day (QID) | INTRAMUSCULAR | Status: DC | PRN

## 2022-08-10 MED ORDER — HEPARIN SODIUM (PORCINE) 1000 UNIT/ML IJ SOLN
INTRAMUSCULAR | Status: AC
  Filled 2022-08-10: qty 10

## 2022-08-10 MED ORDER — SODIUM CHLORIDE 0.9 % IV SOLN: 250.0000 mL | INTRAVENOUS | Status: DC | PRN

## 2022-08-10 MED ORDER — HEPARIN (PORCINE) IN NACL 1000-0.9 UT/500ML-% IV SOLN
INTRAVENOUS | Status: DC | PRN
  Administered 2022-08-10: 500 mL

## 2022-08-10 MED ORDER — FENTANYL CITRATE (PF) 100 MCG/2ML IJ SOLN
INTRAMUSCULAR | Status: DC | PRN
  Administered 2022-08-10 (×2): 25 ug via INTRAVENOUS

## 2022-08-10 MED ORDER — ACETAMINOPHEN 325 MG PO TABS: 650.0000 mg | ORAL_TABLET | ORAL | Status: DC | PRN

## 2022-08-10 MED ORDER — SODIUM CHLORIDE 0.9% FLUSH: 3.0000 mL | INTRAVENOUS | Status: DC | PRN

## 2022-08-10 MED ORDER — SODIUM CHLORIDE 0.9 % IV SOLN: INTRAVENOUS | Status: DC

## 2022-08-10 SURGICAL SUPPLY — 12 items
CATH DECANAV F CURVE (CATHETERS) IMPLANT
CATH SMTCH THERMOCOOL SF FJ (CATHETERS) IMPLANT
CATH SOUNDSTAR ECO 8FR (CATHETERS) IMPLANT
DEVICE CLOSURE MYNXGRIP 6/7F (Vascular Products) IMPLANT
PACK EP LATEX FREE (CUSTOM PROCEDURE TRAY) ×1
PACK EP LF (CUSTOM PROCEDURE TRAY) ×1 IMPLANT
PAD DEFIB RADIO PHYSIO CONN (PAD) ×1 IMPLANT
PATCH CARTO3 (PAD) IMPLANT
SHEATH PINNACLE 8F 10CM (SHEATH) IMPLANT
SHEATH PINNACLE 9F 10CM (SHEATH) IMPLANT
SHEATH PROBE COVER 6X72 (BAG) IMPLANT
TUBING SMART ABLATE COOLFLOW (TUBING) IMPLANT

## 2022-08-10 NOTE — Discharge Instructions (Signed)

## 2022-08-10 NOTE — Progress Notes (Unsigned)
Cardiology Office Note:   Date:  08/12/2022  ID:  Marc Gilbert, DOB [birth year], MRN [id number] PCP: Collene Mares, PA  [facility] Providers Cardiologist:  Rollene Rotunda, MD Electrophysiologist:  Maurice Small, MD {  History of Present Illness:   Marc Gilbert is a 89 y.o. male who presents for follow up of CAD/CABG.    He had CABG 08/14/2016 w/ LIMA-LAD and SVG-OM.  He was recently found to have atrial flutter.  He had ablation the other day.  Of note prior to his surgery he had an elevated TSH that was being monitored.  He was on amiodarone briefly but this has been discontinued.  His beta-blocker had also been discontinued at the time of his monitor because of his pauses.  He did have some hematuria when his Eliquis was started and he did see his urologist but this has for the most part cleared.  He is not tolerating his anticoagulant.  He denies any chest pressure, neck or arm discomfort.  He is not noticing any palpitations, presyncope or syncope.  He had no PND or orthopnea.  He gets around slowly with his cane after his hip surgery.  He does have some increased lower extremity swelling.  He has been taking his Lasix about every other day or so.  He had some renal insufficiency follow-up.  ROS: As stated in the HPI and negative for all other systems.  Studies Reviewed:    EKG:   NA   Risk Assessment/Calculations:    CHA2DS2-VASc Score = 4   This indicates a 4.8% annual risk of stroke. The patient's score is based upon: CHF History: 0 HTN History: 1 Diabetes History: 0 Stroke History: 0 Vascular Disease History: 1 Age Score: 2 Gender Score: 0    Physical Exam:   VS:  BP 128/80 (BP Location: Left Arm, Patient Position: Sitting, Cuff Size: Normal)   Pulse (!) 58   Ht 6' (1.829 m)   Wt 226 lb 9.6 oz (102.8 kg)   SpO2 97%   BMI 30.73 kg/m    Wt Readings from Last 3 Encounters:  08/12/22 226 lb 9.6 oz (102.8 kg)  08/10/22 220 lb (99.8 kg)  08/07/22 224  lb 3.2 oz (101.7 kg)     GEN: Well nourished, well developed in no acute distress NECK: No JVD; No carotid bruits CARDIAC: RRR, no murmurs, rubs, gallops RESPIRATORY:  Clear to auscultation without rales, wheezing or rhonchi  ABDOMEN: Soft, non-tender, non-distended EXTREMITIES: Moderate bilateral ankle edema; No deformity   ASSESSMENT AND PLAN:   CAD:  The patient has no new sypmtoms.  No further cardiovascular testing is indicated.  We will continue with aggressive risk reduction and meds as listed.   HTN: The blood pressure is at target.  No change in therapy.  At target.  No change in therapy.     DYSLIPIDEMIA:    LDL was 74 with an HDL of 70.  No change in therapy.   ATRIAL FLUTTER: For now we are going to continue his Eliquis but we are going to stop his aspirin for now.  If in the future we stop the Eliquis we will need to restart ASA.  Given his previous bradycardia it is okay for him to stay off of his metoprolol.  ELEVATED TSH:  I will check a T3 and T4.  He is going to follow-up with his primary provider for his elevated TSH.  He is off amiodarone.  EDEMA: He has had  some diastolic dysfunction and edema.  He is going to continue with his Lasix 20 mg as needed and we reviewed this.  If he takes this frequently he would need to have follow-up of his renal insufficiency given his CKD.  CKD: His creatinine has been mildly elevated at the last few visits but this can be monitored and also followed by his primary provider.      Follow up me in six months.   Signed, Rollene Rotunda, MD

## 2022-08-10 NOTE — Interval H&P Note (Signed)
History and Physical Interval Note:  08/10/2022 10:12 AM  Marc Gilbert  has presented today for surgery, with the diagnosis of aflutter.  The various methods of treatment have been discussed with the patient and family. After consideration of risks, benefits and other options for treatment, the patient has consented to  Procedure(s): A-FLUTTER ABLATION (N/A) as a surgical intervention.  The patient's history has been reviewed, patient examined, no change in status, stable for surgery.  I have reviewed the patient's chart and labs.  Questions were answered to the patient's satisfaction.     [name]  [name]

## 2022-08-11 ENCOUNTER — Encounter (HOSPITAL_COMMUNITY): Payer: Self-pay | Admitting: Cardiovascular Disease

## 2022-08-11 DIAGNOSIS — M79675 Pain in left toe(s): Secondary | ICD-10-CM | POA: Diagnosis not present

## 2022-08-11 DIAGNOSIS — M79674 Pain in right toe(s): Secondary | ICD-10-CM | POA: Diagnosis not present

## 2022-08-11 DIAGNOSIS — B351 Tinea unguium: Secondary | ICD-10-CM | POA: Diagnosis not present

## 2022-08-12 ENCOUNTER — Encounter: Payer: Self-pay | Admitting: Cardiology

## 2022-08-12 ENCOUNTER — Ambulatory Visit: Payer: Medicare Other | Attending: Cardiology | Admitting: Cardiology

## 2022-08-12 VITALS — BP 128/80 | HR 58 | Ht 72.0 in | Wt 226.6 lb

## 2022-08-12 DIAGNOSIS — I4892 Unspecified atrial flutter: Secondary | ICD-10-CM | POA: Diagnosis not present

## 2022-08-12 DIAGNOSIS — I25118 Atherosclerotic heart disease of native coronary artery with other forms of angina pectoris: Secondary | ICD-10-CM | POA: Insufficient documentation

## 2022-08-12 DIAGNOSIS — E785 Hyperlipidemia, unspecified: Secondary | ICD-10-CM | POA: Insufficient documentation

## 2022-08-12 MED ORDER — FUROSEMIDE 20 MG PO TABS: 20.0000 mg | ORAL_TABLET | Freq: Every day | ORAL | 3 refills | Status: DC

## 2022-08-12 NOTE — Patient Instructions (Signed)
Medication Instructions:  Your physician has recommended you make the following change in your medication:   -Stop taking aspirin.  *If you need a refill on your cardiac medications before your next appointment, please call your pharmacy*   Lab Work: Your physician recommends that you have labs drawn today: T3, T4  If you have labs (blood work) drawn today and your tests are completely normal, you will receive your results only by: MyChart Message (if you have MyChart) OR A paper copy in the mail If you have any lab test that is abnormal or we need to change your treatment, we will call you to review the results.    Follow-Up: At [facility], you and your health needs are our priority.  As part of our continuing mission to provide you with exceptional heart care, we have created designated Provider Care Teams.  These Care Teams include your primary Cardiologist (physician) and Advanced Practice Providers (APPs -  Physician Assistants and Nurse Practitioners) who all work together to provide you with the care you need, when you need it.  We recommend signing up for the patient portal called "MyChart".  Sign up information is provided on this After Visit Summary.  MyChart is used to connect with patients for Virtual Visits (Telemedicine).  Patients are able to view lab/test results, encounter notes, upcoming appointments, etc.  Non-urgent messages can be sent to your provider as well.   To learn more about what you can do with MyChart, go to [email or web address].    Your next appointment:   6 month(s)  Provider:   Rollene Rotunda, MD

## 2022-08-13 ENCOUNTER — Ambulatory Visit: Payer: Medicare Other | Admitting: Physician Assistant

## 2022-08-13 ENCOUNTER — Ambulatory Visit: Payer: Medicare Other | Admitting: Orthopaedic Surgery

## 2022-08-13 LAB — T4, FREE: Free T4: 0.77 ng/dL — ABNORMAL LOW (ref 0.82–1.77)

## 2022-08-13 LAB — T3, FREE: T3, Free: 1.9 pg/mL — ABNORMAL LOW (ref 2.0–4.4)

## 2022-08-14 ENCOUNTER — Other Ambulatory Visit: Payer: Self-pay | Admitting: Urology

## 2022-08-19 ENCOUNTER — Encounter: Payer: Self-pay | Admitting: Physical Medicine and Rehabilitation

## 2022-08-19 ENCOUNTER — Ambulatory Visit (INDEPENDENT_AMBULATORY_CARE_PROVIDER_SITE_OTHER): Payer: Medicare Other | Admitting: Physical Medicine and Rehabilitation

## 2022-08-19 DIAGNOSIS — M47819 Spondylosis without myelopathy or radiculopathy, site unspecified: Secondary | ICD-10-CM | POA: Diagnosis not present

## 2022-08-19 DIAGNOSIS — R269 Unspecified abnormalities of gait and mobility: Secondary | ICD-10-CM | POA: Diagnosis not present

## 2022-08-19 DIAGNOSIS — M545 Low back pain, unspecified: Secondary | ICD-10-CM

## 2022-08-19 DIAGNOSIS — G8929 Other chronic pain: Secondary | ICD-10-CM

## 2022-08-19 DIAGNOSIS — M47816 Spondylosis without myelopathy or radiculopathy, lumbar region: Secondary | ICD-10-CM | POA: Diagnosis not present

## 2022-08-19 NOTE — Progress Notes (Signed)
Functional Pain Scale - descriptive words and definitions  Uncomfortable (3)  Pain is present but can complete all ADL's/sleep is slightly affected and passive distraction only gives marginal relief. Mild range order  Average Pain  varies, worse in the mornings  Lower back pain in the middle with no radiation

## 2022-08-19 NOTE — Progress Notes (Signed)
Marc Gilbert - 89 y.o. male MRN [id number]  Date of birth: [birth year]  Office Visit Note: Visit Date: 08/19/2022 PCP: Collene Mares, PA Referred by: Collene Mares, PA  Subjective: Chief Complaint  Patient presents with   Lower Back - Pain   HPI: [name] is a 89 y.o. male who comes in today for evaluation of chronic, worsening and severe bilateral lower back pain. Pain ongoing for several years, worsens with standing and activity. He describes pain as sore and aching, currently rates as 7 out of 10. Some relief of pain with physician directed exercise regimen, rest and use of medications. Patient recently underwent cardiac ablation procedure and reports he has been less active recently. He plans to start water aerobics soon. Lumbar MRI imaging from 2023 exhibits severe L5-S1 facet arthrosis with worsened anterolisthesis and unchanged moderate bilateral foraminal stenosis. No high grade spinal canal stenosis noted. Patient underwent right L5-S1 facet joint injections in our office in 2023, he reports significant and sustained relief of pain with this procedure. He is unable to recall exact time of pain relief, but feels injection helped alleviate his pain substantially. History of left total hip arthroplasty with Dr. Doneen Poisson in 2023, states he is doing well post surgery. Patient currently resides at [facility], he ambulates with cane. Patient denies focal weakness, numbness and tingling. No recent trauma or falls.     Review of Systems  Musculoskeletal:  Positive for back pain.  Neurological:  Negative for tingling, sensory change, focal weakness and weakness.  All other systems reviewed and are negative.  Otherwise per HPI.  Assessment & Plan: Visit Diagnoses:    ICD-10-CM   1. Chronic bilateral low back pain without sciatica  M54.50 Ambulatory referral to Physical Medicine Rehab   G89.29     2. Spondylosis without myelopathy or radiculopathy  M47.819  Ambulatory referral to Physical Medicine Rehab    3. Facet arthropathy, lumbar  M47.816 Ambulatory referral to Physical Medicine Rehab    4. Gait abnormality  R26.9 Ambulatory referral to Physical Medicine Rehab       Plan: Findings:  Chronic, worsening and severe bilateral lower back pain. No radicular symptoms. Patient continues to have severe pain despite good conservative therapies such as home exercise regimen, rest and use of medications.  Patient's clinical presentation and exam are consistent with facet mediated pain.  He does have pain with lumbar extension upon exam today.  Significant relief of pain with prior facet joint injections performed in our office in 2023. Next step is to perform diagnostic and hopefully therapeutic bilateral L5-S1 facet joint injections under fluoroscopic guidance.  If good relief of pain with facet joint injections I discussed possibility of longer sustained relief of pain with radiofrequency ablation procedure.  Patient did voice concern he will need to stop his Eliquis to have this injection, I did inform him he can continue with his Eliquis for this particular type of injection.  Patient has no specific questions regarding injection at this time. I encouraged him to remain active as tolerated.  No red flag symptoms noted upon exam today.    Meds & Orders: No orders of the defined types were placed in this encounter.   Orders Placed This Encounter  Procedures   Ambulatory referral to Physical Medicine Rehab    Follow-up: Return for Bilateral L5-S1 facet joint injections.   Procedures: No procedures performed      Clinical History: MRI LUMBAR SPINE WITHOUT CONTRAST   TECHNIQUE:  Multiplanar, multisequence MR imaging of the lumbar spine was performed. No intravenous contrast was administered.   COMPARISON:  10/29/2005   FINDINGS: Segmentation:  Standard   Alignment: Grade 1 retrolisthesis at L2-3 and L3-4. Grade 1 anterolisthesis at L5-S1. S  shaped scoliosis.   Vertebrae:  No fracture, evidence of discitis, or bone lesion.   Conus medullaris and cauda equina: Conus extends to the L1 level. Conus and cauda equina appear normal.   Paraspinal and other soft tissues: Fatty atrophy of the paraspinous muscles   Disc levels:   T12-L1: Small disc bulge without stenosis.   L1-L2: Progression of disc bulge with endplate spurring. No spinal canal stenosis. Mild left neural foraminal stenosis.   L2-L3: Small disc bulge, unchanged. No spinal canal stenosis. Mild left neural foraminal stenosis.   L3-L4: Progression of disc degeneration with small bulge and endplate spurring. Unchanged left lateral recess narrowing without central spinal canal stenosis. Progression of moderate left neural foraminal stenosis.   L4-L5: Small right asymmetric disc bulge, slightly worsened. No spinal canal stenosis. Unchanged mild bilateral neural foraminal stenosis.   L5-S1: Progression of severe facet arthrosis with increased anterolisthesis. Regression of right subarticular disc herniation. Narrowing of the lateral recesses without central spinal canal stenosis. Unchanged moderate bilateral neural foraminal stenosis.   Visualized sacrum: Normal.   IMPRESSION: 1. Progression of severe L5-S1 facet arthrosis with worsened anterolisthesis and unchanged moderate bilateral foraminal stenosis. 2. Progression of degenerative disc disease at L3-4 with worsened moderate left foraminal stenosis.     Electronically Signed   By: Deatra Robinson M.D.   On: 06/16/2021 21:10   He reports that he quit smoking about 34 years ago. His smoking use included cigarettes. He has a 12.50 pack-year smoking history. He has never used smokeless tobacco. No results for input(s): "HGBA1C", "LABURIC" in the last 8760 hours.  Objective:  VS:  HT:    WT:   BMI:     BP:   HR: bpm  TEMP: ( )  RESP:  Physical Exam Vitals and nursing note reviewed.  HENT:     Head:  Normocephalic and atraumatic.     Right Ear: External ear normal.     Left Ear: External ear normal.     Nose: Nose normal.     Mouth/Throat:     Mouth: Mucous membranes are moist.  Eyes:     Extraocular Movements: Extraocular movements intact.  Cardiovascular:     Rate and Rhythm: Normal rate.     Pulses: Normal pulses.  Pulmonary:     Effort: Pulmonary effort is normal.  Abdominal:     General: Abdomen is flat. There is no distension.  Musculoskeletal:        General: Tenderness present.     Cervical back: Normal range of motion.     Comments: Patient is slow to rise from seated position to standing. Pain noted with facet loading and lumbar extension. 5/5 strength noted with bilateral hip flexion, knee flexion/extension, ankle dorsiflexion/plantarflexion and EHL. No clonus noted bilaterally. No pain upon palpation of greater trochanters. No pain with internal/external rotation of bilateral hips. Sensation intact bilaterally. Negative slump test bilaterally. Ambulates with cane, gait slow and unsteady.  Skin:    General: Skin is warm and dry.     Capillary Refill: Capillary refill takes less than 2 seconds.  Neurological:     Mental Status: He is alert and oriented to person, place, and time.     Gait: Gait abnormal.  Psychiatric:  Mood and Affect: Mood normal.        Behavior: Behavior normal.     Ortho Exam  Imaging: No results found.  Past Medical/Family/Surgical/Social History: Medications & Allergies reviewed per EMR, new medications updated. Patient Active Problem List   Diagnosis Date Noted   New onset atrial flutter (HCC) 06/15/2022   Acute on chronic diastolic heart failure (HCC) 06/15/2022   Hypercoagulable state due to typical atrial flutter (HCC) 06/15/2022   Dermatochalasis 04/30/2022   Status post total replacement of left hip 12/25/2021   Trochanteric bursitis, left hip 08/20/2021   Unilateral primary osteoarthritis, left hip 05/27/2021   Low back  pain 05/27/2021   DVT (deep venous thrombosis) (HCC) 07/02/2020   S/P reverse total shoulder arthroplasty, right 06/18/2020   Osteoarthritis of right shoulder 05/16/2020   Malignant neoplasm of prostate (HCC) 04/25/2019   Dyslipidemia 01/03/2019   Pain in right shoulder 09/20/2018   Unilateral primary osteoarthritis, right knee 09/20/2018   Bilateral hearing loss 12/07/2016   Bilateral impacted cerumen 12/07/2016   Presence of aortocoronary bypass graft 09/17/2016   Coronary artery disease of native heart with stable angina pectoris (HCC) 09/17/2016   Unstable angina (HCC) 09/17/2016   Hx of CABG 08/14/2016   Chest pain 08/10/2016   CKD (chronic kidney disease) stage 3, GFR 30-59 ml/min (HCC) 08/30/2015   Hyperlipidemia 08/30/2015   Osteoarthritis of hip 05/01/2011   Essential hypertension 05/01/2011   COPD (chronic obstructive pulmonary disease) (HCC) 05/01/2011   Cancer of skin, face 05/01/2011   Past Medical History:  Diagnosis Date   Arthritis    "right hand; back" (08/30/2015)   Cancer (HCC)    skin  squamous and basal   Chronic lower back pain    CKD (chronic kidney disease), stage III (HCC)    Stage III   COPD (chronic obstructive pulmonary disease) (HCC)    no home O2   Coronary artery disease    Dysplastic colon polyp age 8   carcinoma in situ   GERD (gastroesophageal reflux disease)    occ   H/O hiatal hernia    Hyperlipidemia    Hypertension    Prostate cancer (HCC) 07/2014   active surveillance, Glisson 6   Family History  Problem Relation Age of Onset   Breast cancer Mother 32       mastectomy   Diabetes Father    Breast cancer Daughter        2005. Lumpectomy with chemo and radiation. Under the care of Dr. Darnelle Catalan     Pancreatic cancer Neg Hx    Colon cancer Neg Hx    Prostate cancer Neg Hx    Past Surgical History:  Procedure Laterality Date   A-FLUTTER ABLATION N/A 08/10/2022   Procedure: A-FLUTTER ABLATION;  Surgeon: Mealor, [name], MD;   Location: [facility] INVASIVE CV LAB;  Service: Cardiovascular;  Laterality: N/A;   CARDIAC CATHETERIZATION     CATARACT EXTRACTION W/ INTRAOCULAR LENS IMPLANT Left 08/27/2015   CORONARY ARTERY BYPASS GRAFT N/A 08/14/2016   Procedure: CORONARY ARTERY BYPASS GRAFTING (CABG)x2, using left internal mammary artery and right greater saphenous vein harvested endoscopically;  Surgeon: Alleen Borne, MD;  Location: [facility] OR;  Service: Open Heart Surgery;  Laterality: N/A;   JOINT REPLACEMENT     LEFT HEART CATH AND CORONARY ANGIOGRAPHY N/A 08/11/2016   Procedure: Left Heart Cath and Coronary Angiography;  Surgeon: Lyn Records, MD;  [facility] INVAS[facility] CV LAB;  Service: Cardiovascular;  Laterality: N/A;   MOHS  SURGERY     multiple SCC   PROSTATE BIOPSY  07/18/2014   TEE WITHOUT CARDIOVERSION N/A 08/14/2016   Procedure: TRANSESOPHAGEAL ECHOCARDIOGRAM (TEE);  Surgeon: Alleen Borne, MD;  Location: [facility] OR;  Service: Open Heart Surgery;  Laterality: N/A;   TONSILLECTOMY     TOTAL HIP ARTHROPLASTY  05/26/2011   Procedure: TOTAL HIP ARTHROPLASTY;  Surgeon: Valeria Batman, MD;  Location: [facility] OR;  Service: Orthopedics;  Laterality: Right;   TOTAL HIP ARTHROPLASTY Left 12/25/2021   Procedure: LEFT TOTAL HIP ARTHROPLASTY ANTERIOR APPROACH;  Surgeon: Kathryne Hitch, MD;  Location: [facility] ORS;  Service: Orthopedics;  Laterality: Left;   TOTAL SHOULDER ARTHROPLASTY Right 06/18/2020   Procedure: TOTAL SHOULDER ARTHROPLASTY;  Surgeon: Teryl Lucy, MD;  Location: [facility] ORS;  Service: Orthopedics;  Laterality: Right;   ULTRASOUND GUIDANCE FOR VASCULAR ACCESS  08/11/2016   Procedure: Ultrasound Guidance For Vascular Access;  Surgeon: Lyn Records, MD;  Location: [facility] INVASIVE CV LAB;  Service: Cardiovascular;;   Social History   Occupational History   Occupation: retired 1996  Tobacco Use   Smoking status: Former    Packs/day: 0.50    Years: 25.00    Additional pack years: 0.00    Total pack years: 12.50     Types: Cigarettes    Quit date: 05/24/1988    Years since quitting: 34.2   Smokeless tobacco: Never  Vaping Use   Vaping Use: Never used  Substance and Sexual Activity   Alcohol use: Yes    Alcohol/week: 10.0 standard drinks of alcohol    Types: 5 Glasses of wine, 5 Cans of beer per week    Comment: daily   Drug use: No   Sexual activity: Not Currently

## 2022-09-02 ENCOUNTER — Telehealth: Payer: Self-pay | Admitting: Orthopedic Surgery

## 2022-09-02 DIAGNOSIS — T148XXA Other injury of unspecified body region, initial encounter: Secondary | ICD-10-CM | POA: Diagnosis not present

## 2022-09-02 DIAGNOSIS — I872 Venous insufficiency (chronic) (peripheral): Secondary | ICD-10-CM | POA: Diagnosis not present

## 2022-09-02 NOTE — Telephone Encounter (Signed)
Marc Gilbert states that he is calling back in to check on the status of scheduling his ESI with Dr. Alvester Morin. You can reach him on his cell at [phone].  I advised him that, per his referral, we attempted to call and got no answer.  He will have his cell phone on him at all times. Thank you!

## 2022-09-02 NOTE — Telephone Encounter (Signed)
scheduled

## 2022-09-09 ENCOUNTER — Telehealth: Payer: Self-pay | Admitting: *Deleted

## 2022-09-09 ENCOUNTER — Ambulatory Visit (HOSPITAL_COMMUNITY): Payer: Medicare Other | Admitting: Physician Assistant

## 2022-09-09 NOTE — Telephone Encounter (Signed)
Medicare auth pending for bleph

## 2022-09-10 DIAGNOSIS — E039 Hypothyroidism, unspecified: Secondary | ICD-10-CM | POA: Diagnosis not present

## 2022-09-11 DIAGNOSIS — E039 Hypothyroidism, unspecified: Secondary | ICD-10-CM | POA: Diagnosis not present

## 2022-09-14 ENCOUNTER — Encounter: Payer: Self-pay | Admitting: Plastic Surgery

## 2022-09-14 ENCOUNTER — Ambulatory Visit: Payer: Medicare Other | Attending: Cardiovascular Disease | Admitting: Cardiovascular Disease

## 2022-09-14 ENCOUNTER — Encounter: Payer: Self-pay | Admitting: Cardiovascular Disease

## 2022-09-14 VITALS — BP 126/78 | HR 95 | Ht 72.0 in | Wt 217.2 lb

## 2022-09-14 DIAGNOSIS — I25118 Atherosclerotic heart disease of native coronary artery with other forms of angina pectoris: Secondary | ICD-10-CM | POA: Diagnosis not present

## 2022-09-14 NOTE — Progress Notes (Signed)
Electrophysiology Office Note:    Date:  09/14/2022   ID:  Marc Gilbert, DOB 13-Jan-1931, MRN [id number]  PCP:  Marc Mares, PA   Marc Gilbert Providers Cardiologist:  Marc Rotunda, MD Electrophysiologist:  Marc Small, MD     Referring MD: Marc Gilbert, [place], [place]   History of Present Illness:    Marc Gilbert is a 89 y.o. male with a hx listed below, significant for CAD status post CABG 2018, persistent atrial fibrillation and flutter, hypertension, hyperkalemia, CKD 3, prostate cancer, COPD referred for arrhythmia management.  I reviewed notes by Drs. Hochrein and Corky Sing, NP.  He saw Dr. Herbie Baltimore in clinic on June 15, 2022.  At that time he presented with Apple Watch notifications for atrial fibrillation. According to Dr. Elissa Hefty note, the EKG showed atrial flutter, and review of strips showed only atrial flutter, and the conclusion was that the patient likely had only flutter --not fibrillation.  He was started on amiodarone and a Zio patch placed to assess the flutter chronicity.  The patch showed the patient was persistently in flutter for the duration of where, 48 hours.  The patch also showed multiple pauses, up to 6.1 seconds.  There were multiple symptomatic events associated with PVCs, but no symptoms associated with the pauses.  He underwent ablation of typical atrial flutter on August 10, 2022.  He reports that he feels much better now that he is in sinus rhythm.    EKGs/Labs/Other Studies Reviewed Today:    Echocardiogram:  Ordered    Monitors:  Zio 48hr 05/2022 - my interpretation 100% atrial flutter, V-rates 30-133, avg 73 32 pauses occurred, the longest was 6.1 seconds at about 3:30 PM. Pauses appeared to occur either in the early morning or later afternoon.  Stress testing:   Advanced imaging:   Cardiac catherization   EKG:  Last EKG results: today - typical flutter with controlled ventricular rates   Recent  Labs: 06/23/2022: ALT 32 08/07/2022: BUN 24; Creatinine, Ser 1.54; Hemoglobin 13.9; Platelets 161; Potassium 4.5; Sodium 139; TSH 15.000     Physical Exam:    VS:  BP 126/78 (BP Location: Left Arm, Patient Position: Sitting, Cuff Size: Normal)   Pulse 95   Ht 6' (1.829 m)   Wt 217 lb 3.2 oz (98.5 kg)   SpO2 93%   BMI 29.46 kg/m     Wt Readings from Last 3 Encounters:  09/14/22 217 lb 3.2 oz (98.5 kg)  08/12/22 226 lb 9.6 oz (102.8 kg)  08/10/22 220 lb (99.8 kg)     GEN:  Well nourished, well developed in no acute distress CARDIAC: iRRR, no murmurs, rubs, gallops RESPIRATORY:  Normal work of breathing MUSCULOSKELETAL: no edema    ASSESSMENT & PLAN:    Atrial flutter Maintaining sinus rhythm after flutter ablation and feeling much better Amiodarone was discontinued  Pauses These were pauses in the AV node that occurred during flutter, were asymptomatic I suspect these occurred during sleep  Currently, there is no indication for pacemaker  Secondary hypercoagulable state Continue apixaban 5 He is monitoring with apple watch. We may consider discontinuing eliquis in follow-up  Acute on chronic CHFpEF Diastolic dysfunction with loss of AV synchrony Feeling much better in sinus rhythm            Medication Adjustments/Labs and Tests Ordered: Current medicines are reviewed at length with the patient today.  Concerns regarding medicines are outlined above.  Orders Placed This Encounter  Procedures   EKG 12-Lead   No orders of the defined types were placed in this encounter.    Signed, Marc Small, MD  09/14/2022 3:18 PM    Mabel Gilbert

## 2022-09-14 NOTE — Patient Instructions (Signed)
Medication Instructions:  Your physician recommends that you continue on your current medications as directed. Please refer to the Current Medication list given to you today. *If you need a refill on your cardiac medications before your next appointment, please call your pharmacy*   Follow-Up: At [facility], you and your health needs are our priority.  As part of our continuing mission to provide you with exceptional heart care, we have created designated Provider Care Teams.  These Care Teams include your primary Cardiologist (physician) and Advanced Practice Providers (APPs -  Physician Assistants and Nurse Practitioners) who all work together to provide you with the care you need, when you need it.  We recommend signing up for the patient portal called "MyChart".  Sign up information is provided on this After Visit Summary.  MyChart is used to connect with patients for Virtual Visits (Telemedicine).  Patients are able to view lab/test results, encounter notes, upcoming appointments, etc.  Non-urgent messages can be sent to your provider as well.   To learn more about what you can do with MyChart, go to [email or web address].    Your next appointment:   6 month(s)  Provider:   Augustus Mealor, MD  

## 2022-09-17 ENCOUNTER — Ambulatory Visit (INDEPENDENT_AMBULATORY_CARE_PROVIDER_SITE_OTHER): Payer: Medicare Other | Admitting: Physical Medicine and Rehabilitation

## 2022-09-17 ENCOUNTER — Other Ambulatory Visit: Payer: Self-pay

## 2022-09-17 VITALS — BP 147/78 | HR 97

## 2022-09-17 DIAGNOSIS — M47816 Spondylosis without myelopathy or radiculopathy, lumbar region: Secondary | ICD-10-CM

## 2022-09-17 MED ORDER — METHYLPREDNISOLONE ACETATE 80 MG/ML IJ SUSP
80.0000 mg | Freq: Once | INTRAMUSCULAR | Status: AC
  Administered 2022-09-17: 80 mg

## 2022-09-17 NOTE — Progress Notes (Signed)
Functional Pain Scale - descriptive words and definitions  Moderate (4)   Constantly aware of pain, can complete ADLs with modification/sleep marginally affected at times/passive distraction is of no use, but active distraction gives some relief. Moderate range order  Average Pain 3-7   +Driver, -BT, -Dye Allergies.  Lower back pain on both sides with no radiation in legs. Sitting helps the pain

## 2022-09-17 NOTE — Patient Instructions (Signed)

## 2022-09-24 ENCOUNTER — Encounter: Payer: Self-pay | Admitting: Plastic Surgery

## 2022-09-29 ENCOUNTER — Encounter: Payer: Self-pay | Admitting: Plastic Surgery

## 2022-09-29 ENCOUNTER — Encounter: Payer: Self-pay | Admitting: Cardiology

## 2022-10-09 ENCOUNTER — Telehealth: Payer: Self-pay

## 2022-10-09 NOTE — Telephone Encounter (Signed)
   Pre-operative Risk Assessment    Patient Name: Marc Gilbert  DOB: 1930/05/29 MRN: [id number]   Last OV 09/14/22 with Dr. Nelly Laurence Next OV 01/21/23 with Dr. Antoine Poche   Request for Surgical Clearance    Procedure:   Bilateral Lid Bleopharoplasty  Date of Surgery:  Clearance 11/18/22                                 Surgeon:  Dr. Foster Simpson Surgeon's Group or Practice Name:  [facility] Phone number:  [phone] Fax number:  [phone]   Type of Clearance Requested:   - Medical  - Pharmacy:  Hold Apixaban (Eliquis) pt will need instructions on when/if to hold   Type of Anesthesia:  General    Additional requests/questions:    Wynetta Fines   10/09/2022, 2:54 PM

## 2022-10-09 NOTE — Telephone Encounter (Signed)
Pharmacy please advise on holding Eliquis prior to bilateral blepharoplasty scheduled for 11/18/2022. Thank you.

## 2022-10-09 NOTE — Telephone Encounter (Signed)
Patient with diagnosis of atrial fibrillation on Eliquis for anticoagulation.    Procedure:   Bilateral Lid Bleopharoplasty   Date of Surgery:  Clearance 11/18/22   CHA2DS2-VASc Score = 5   This indicates a 7.2% annual risk of stroke. The patient's score is based upon: CHF History: 1 HTN History: 1 Diabetes History: 0 Stroke History: 0 Vascular Disease History: 1 Age Score: 2 Gender Score: 0   Chart notes history of provoked DVT 06/2020  Patient had AF Ablation 08/10/22 - procedure date is > 3 months from this  CrCl 44 Platelet count 161  Per office protocol, patient can hold Eliquis for 2 days prior to procedure.   Patient will not need bridging with Lovenox (enoxaparin) around procedure.  **This guidance is not considered finalized until pre-operative APP has relayed final recommendations.**

## 2022-10-09 NOTE — Telephone Encounter (Signed)
Faxed surgical clearance form to Dr. Antoine Poche, cardiologist, with confirmed receipt.

## 2022-10-09 NOTE — Telephone Encounter (Signed)
   Name: Marc Gilbert  DOB: 1930-08-06  MRN: [id number]  Primary Cardiologist: Rollene Rotunda, MD   Preoperative team, please contact this patient and set up a phone call appointment for further preoperative risk assessment. Please obtain consent and complete medication review. Thank you for your help.  I confirm that guidance regarding antiplatelet and oral anticoagulation therapy has been completed and, if necessary, noted below.  Per office protocol, patient can hold Eliquis for 2 days prior to procedure.   Patient will not need bridging with Lovenox (enoxaparin) around procedure.   Napoleon Form, Leodis Rains, NP 10/09/2022, 4:58 PM [facility]

## 2022-10-12 ENCOUNTER — Other Ambulatory Visit: Payer: Self-pay

## 2022-10-12 ENCOUNTER — Telehealth: Payer: Self-pay | Admitting: *Deleted

## 2022-10-12 ENCOUNTER — Encounter: Payer: Self-pay | Admitting: Cardiology

## 2022-10-12 MED ORDER — APIXABAN 5 MG PO TABS: 5.0000 mg | ORAL_TABLET | Freq: Two times a day (BID) | ORAL | 6 refills | Status: DC

## 2022-10-12 NOTE — Telephone Encounter (Signed)
Pt has been scheduled for tele pre op appt 10/30/22 @ 9:40. Med rec and consent are done.

## 2022-10-12 NOTE — Telephone Encounter (Signed)
Pt has been scheduled for tele pre op appt 10/30/22 @ 9:40. Med rec and consent are done.     Patient Consent for Virtual Visit        Marc Gilbert has provided verbal consent on 10/12/2022 for a virtual visit (video or telephone).   CONSENT FOR VIRTUAL VISIT FOR:  Marc Gilbert  By participating in this virtual visit I agree to the following:  I hereby voluntarily request, consent and authorize [facility] and its employed or contracted physicians, physician assistants, nurse practitioners or other licensed health care professionals (the Practitioner), to provide me with telemedicine health care services (the "Services") as deemed necessary by the treating Practitioner. I acknowledge and consent to receive the Services by the Practitioner via telemedicine. I understand that the telemedicine visit will involve communicating with the Practitioner through live audiovisual communication technology and the disclosure of certain medical information by electronic transmission. I acknowledge that I have been given the opportunity to request an in-person assessment or other available alternative prior to the telemedicine visit and am voluntarily participating in the telemedicine visit.  I understand that I have the right to withhold or withdraw my consent to the use of telemedicine in the course of my care at any time, without affecting my right to future care or treatment, and that the Practitioner or I may terminate the telemedicine visit at any time. I understand that I have the right to inspect all information obtained and/or recorded in the course of the telemedicine visit and may receive copies of available information for a reasonable fee.  I understand that some of the potential risks of receiving the Services via telemedicine include:  Delay or interruption in medical evaluation due to technological equipment failure or disruption; Information transmitted may not be sufficient (e.g.  poor resolution of images) to allow for appropriate medical decision making by the Practitioner; and/or  In rare instances, security protocols could fail, causing a breach of personal health information.  Furthermore, I acknowledge that it is my responsibility to provide information about my medical history, conditions and care that is complete and accurate to the best of my ability. I acknowledge that Practitioner's advice, recommendations, and/or decision may be based on factors not within their control, such as incomplete or inaccurate data provided by me or distortions of diagnostic images or specimens that may result from electronic transmissions. I understand that the practice of medicine is not an exact science and that Practitioner makes no warranties or guarantees regarding treatment outcomes. I acknowledge that a copy of this consent can be made available to me via my patient portal [facility] MyChart), or I can request a printed copy by calling the office of [facility].    I understand that my insurance will be billed for this visit.   I have read or had this consent read to me. I understand the contents of this consent, which adequately explains the benefits and risks of the Services being provided via telemedicine.  I have been provided ample opportunity to ask questions regarding this consent and the Services and have had my questions answered to my satisfaction. I give my informed consent for the services to be provided through the use of telemedicine in my medical care

## 2022-10-13 DIAGNOSIS — B351 Tinea unguium: Secondary | ICD-10-CM | POA: Diagnosis not present

## 2022-10-13 DIAGNOSIS — M79674 Pain in right toe(s): Secondary | ICD-10-CM | POA: Diagnosis not present

## 2022-10-13 DIAGNOSIS — M79675 Pain in left toe(s): Secondary | ICD-10-CM | POA: Diagnosis not present

## 2022-10-17 NOTE — Procedures (Signed)
Lumbar Facet Joint Intra-Articular Injection(s) with Fluoroscopic Guidance  Patient: Marc Gilbert      Date of Birth: 06/14/1930 MRN: [id number] PCP: Collene Mares, PA      Visit Date: 09/17/2022   Universal Protocol:    Date/Time: 09/17/2022  Consent Given By: the patient  Position: PRONE   Additional Comments: Vital signs were monitored before and after the procedure. Patient was prepped and draped in the usual sterile fashion. The correct patient, procedure, and site was verified.   Injection Procedure Details:  Procedure Site One Meds Administered:  Meds ordered this encounter  Medications   methylPREDNISolone acetate (DEPO-MEDROL) injection 80 mg     Laterality: Bilateral  Location/Site:  L5-S1  Needle size: 22 guage  Needle type: Spinal  Needle Placement: Articular  Findings:  -Comments: Excellent flow of contrast producing a partial arthrogram.  Procedure Details: The fluoroscope beam is vertically oriented in AP, and the inferior recess is visualized beneath the lower pole of the inferior apophyseal process, which represents the target point for needle insertion. When direct visualization is difficult the target point is located at the medial projection of the vertebral pedicle. The region overlying each aforementioned target is locally anesthetized with a 1 to 2 ml. volume of 1% Lidocaine without Epinephrine.   The spinal needle was inserted into each of the above mentioned facet joints using biplanar fluoroscopic guidance. A 0.25 to 0.5 ml. volume of Isovue-250 was injected and a partial facet joint arthrogram was obtained. A single spot film was obtained of the resulting arthrogram.    One to 1.25 ml of the steroid/anesthetic solution was then injected into each of the facet joints noted above.   Additional Comments:  No complications occurred Dressing: 2 x 2 sterile gauze and Band-Aid    Post-procedure details: Patient was observed during the  procedure. Post-procedure instructions were reviewed.  Patient left the clinic in stable condition.

## 2022-10-17 NOTE — Progress Notes (Signed)
Marc Gilbert - 89 y.o. male MRN [id number]  Date of birth: Jun 07, 1930  Office Visit Note: Visit Date: 09/17/2022 PCP: Collene Mares, PA Referred by: Collene Mares, PA  Subjective: Chief Complaint  Patient presents with   Lower Back - Pain   HPI:  Marc Gilbert is a 89 y.o. male who comes in today at the request of Ellin Goodie, FNP for planned Bilateral  L5-S1 Lumbar facet/medial branch block with fluoroscopic guidance.  The patient has failed conservative care including home exercise, medications, time and activity modification.  This injection will be diagnostic and hopefully therapeutic.  Please see requesting physician notes for further details and justification.  Exam has shown concordant pain with facet joint loading.   ROS Otherwise per HPI.  Assessment & Plan: Visit Diagnoses:    ICD-10-CM   1. Spondylosis without myelopathy or radiculopathy, lumbar region  M47.816 XR C-ARM NO REPORT    Facet Injection    methylPREDNISolone acetate (DEPO-MEDROL) injection 80 mg      Plan: No additional findings.   Meds & Orders:  Meds ordered this encounter  Medications   methylPREDNISolone acetate (DEPO-MEDROL) injection 80 mg    Orders Placed This Encounter  Procedures   Facet Injection   XR C-ARM NO REPORT    Follow-up: Return for visit to requesting provider as needed.   Procedures: No procedures performed  Lumbar Facet Joint Intra-Articular Injection(s) with Fluoroscopic Guidance  Patient: Marc Gilbert      Date of Birth: 12/12/1930 MRN: [id number] PCP: Collene Mares, PA      Visit Date: 09/17/2022   Universal Protocol:    Date/Time: 09/17/2022  Consent Given By: the patient  Position: PRONE   Additional Comments: Vital signs were monitored before and after the procedure. Patient was prepped and draped in the usual sterile fashion. The correct patient, procedure, and site was verified.   Injection Procedure Details:  Procedure Site  One Meds Administered:  Meds ordered this encounter  Medications   methylPREDNISolone acetate (DEPO-MEDROL) injection 80 mg     Laterality: Bilateral  Location/Site:  L5-S1  Needle size: 22 guage  Needle type: Spinal  Needle Placement: Articular  Findings:  -Comments: Excellent flow of contrast producing a partial arthrogram.  Procedure Details: The fluoroscope beam is vertically oriented in AP, and the inferior recess is visualized beneath the lower pole of the inferior apophyseal process, which represents the target point for needle insertion. When direct visualization is difficult the target point is located at the medial projection of the vertebral pedicle. The region overlying each aforementioned target is locally anesthetized with a 1 to 2 ml. volume of 1% Lidocaine without Epinephrine.   The spinal needle was inserted into each of the above mentioned facet joints using biplanar fluoroscopic guidance. A 0.25 to 0.5 ml. volume of Isovue-250 was injected and a partial facet joint arthrogram was obtained. A single spot film was obtained of the resulting arthrogram.    One to 1.25 ml of the steroid/anesthetic solution was then injected into each of the facet joints noted above.   Additional Comments:  No complications occurred Dressing: 2 x 2 sterile gauze and Band-Aid    Post-procedure details: Patient was observed during the procedure. Post-procedure instructions were reviewed.  Patient left the clinic in stable condition.    Clinical History: MRI LUMBAR SPINE WITHOUT CONTRAST   TECHNIQUE: Multiplanar, multisequence MR imaging of the lumbar spine was performed. No intravenous contrast was administered.   COMPARISON:  10/29/2005   FINDINGS: Segmentation:  Standard   Alignment: Grade 1 retrolisthesis at L2-3 and L3-4. Grade 1 anterolisthesis at L5-S1. S shaped scoliosis.   Vertebrae:  No fracture, evidence of discitis, or bone lesion.   Conus medullaris and  cauda equina: Conus extends to the L1 level. Conus and cauda equina appear normal.   Paraspinal and other soft tissues: Fatty atrophy of the paraspinous muscles   Disc levels:   T12-L1: Small disc bulge without stenosis.   L1-L2: Progression of disc bulge with endplate spurring. No spinal canal stenosis. Mild left neural foraminal stenosis.   L2-L3: Small disc bulge, unchanged. No spinal canal stenosis. Mild left neural foraminal stenosis.   L3-L4: Progression of disc degeneration with small bulge and endplate spurring. Unchanged left lateral recess narrowing without central spinal canal stenosis. Progression of moderate left neural foraminal stenosis.   L4-L5: Small right asymmetric disc bulge, slightly worsened. No spinal canal stenosis. Unchanged mild bilateral neural foraminal stenosis.   L5-S1: Progression of severe facet arthrosis with increased anterolisthesis. Regression of right subarticular disc herniation. Narrowing of the lateral recesses without central spinal canal stenosis. Unchanged moderate bilateral neural foraminal stenosis.   Visualized sacrum: Normal.   IMPRESSION: 1. Progression of severe L5-S1 facet arthrosis with worsened anterolisthesis and unchanged moderate bilateral foraminal stenosis. 2. Progression of degenerative disc disease at L3-4 with worsened moderate left foraminal stenosis.     Electronically Signed   By: Deatra Robinson M.D.   On: 06/16/2021 21:10     Objective:  VS:  HT:    WT:   BMI:     BP:(!) 147/78  HR:97bpm  TEMP: ( )  RESP:  Physical Exam Vitals and nursing note reviewed.  Constitutional:      General: He is not in acute distress.    Appearance: Normal appearance. He is not ill-appearing.  HENT:     Head: Normocephalic and atraumatic.     Right Ear: External ear normal.     Left Ear: External ear normal.     Nose: No congestion.  Eyes:     Extraocular Movements: Extraocular movements intact.  Cardiovascular:      Rate and Rhythm: Normal rate.     Pulses: Normal pulses.  Pulmonary:     Effort: Pulmonary effort is normal. No respiratory distress.  Abdominal:     General: There is no distension.     Palpations: Abdomen is soft.  Musculoskeletal:        General: No tenderness or signs of injury.     Cervical back: Neck supple.     Right lower leg: No edema.     Left lower leg: No edema.     Comments: Patient has good distal strength without clonus.  Skin:    Findings: No erythema or rash.  Neurological:     General: No focal deficit present.     Mental Status: He is alert and oriented to person, place, and time.     Sensory: No sensory deficit.     Motor: No weakness or abnormal muscle tone.     Coordination: Coordination normal.  Psychiatric:        Mood and Affect: Mood normal.        Behavior: Behavior normal.      Imaging: No results found.

## 2022-10-22 DIAGNOSIS — E039 Hypothyroidism, unspecified: Secondary | ICD-10-CM | POA: Diagnosis not present

## 2022-10-26 ENCOUNTER — Ambulatory Visit (INDEPENDENT_AMBULATORY_CARE_PROVIDER_SITE_OTHER): Payer: Medicare Other | Admitting: Surgical

## 2022-10-26 ENCOUNTER — Encounter: Payer: Self-pay | Admitting: Student

## 2022-10-26 VITALS — BP 122/75 | HR 98

## 2022-10-26 DIAGNOSIS — H02831 Dermatochalasis of right upper eyelid: Secondary | ICD-10-CM

## 2022-10-26 DIAGNOSIS — H02834 Dermatochalasis of left upper eyelid: Secondary | ICD-10-CM

## 2022-10-26 MED ORDER — CEPHALEXIN 500 MG PO CAPS: 500.0000 mg | ORAL_CAPSULE | Freq: Four times a day (QID) | ORAL | 0 refills | Status: AC

## 2022-10-26 MED ORDER — ONDANSETRON HCL 4 MG PO TABS: 4.0000 mg | ORAL_TABLET | Freq: Three times a day (TID) | ORAL | 0 refills | Status: DC | PRN

## 2022-10-26 MED ORDER — TRAMADOL HCL 50 MG PO TABS
50.0000 mg | ORAL_TABLET | Freq: Three times a day (TID) | ORAL | 0 refills | Status: AC | PRN
Start: 2022-10-26 — End: 2022-10-29

## 2022-10-26 NOTE — Progress Notes (Signed)
Patient ID: Marc Gilbert, male    DOB: [birth year], 89 y.o.   MRN: [id number]  Chief Complaint  Patient presents with   Pre-op Exam      ICD-10-CM   1. Dermatochalasis of both upper eyelids  H02.831    H02.834       History of Present Illness: Marc Gilbert is a 89 y.o.  male  with a history of dermatochalasis of bilateral upper eyelids.  He presents for preoperative evaluation for upcoming procedure, bilateral upper eyelid blepharoplasty, scheduled for 11/18/2022 with Dr. Ulice Bold.  The patient has not had problems with anesthesia.  No family history of DVT/PE.  No family or personal history of bleeding or clotting disorders.  Patient does report that he believes he had a DVT many years ago, he reports it may have been after a surgical procedure, but he is not 100% sure.  He reports that he is currently on Eliquis. Per EMR review patient had DVT in his right lower extremity after shoulder surgery in May 2022.  He was subsequently placed on Eliquis.  He reports he is scheduled to see cardiology prior to surgery on October 30, 2022  PMH Significant for: DVT of right lower extremity, history of CKD, history of COPD, CAD, GERD, hyperlipidemia, hypertension, atrial flutter status post ablation  Patient reports he is feeling well today, he reports that he lives [place] a [place] community.  He reports that he is not having any active chest pain or shortness of breath, he denies any cardiac or pulmonary symptoms with activity.  He reports he is tolerated anesthesia well in the past without any complications.  He reports that he has difficulty with reading related to his upper eyelid excess skin.   Past Medical History: Allergies: No Known Allergies  Current Medications:  Current Outpatient Medications:    cephALEXin (KEFLEX) 500 MG capsule, Take 1 capsule (500 mg total) by mouth 4 (four) times daily for 3 days., Disp: 12 capsule, Rfl: 0   ondansetron (ZOFRAN) 4 MG tablet, Take  1 tablet (4 mg total) by mouth every 8 (eight) hours as needed for nausea or vomiting., Disp: 20 tablet, Rfl: 0   traMADol (ULTRAM) 50 MG tablet, Take 1 tablet (50 mg total) by mouth every 8 (eight) hours as needed for up to 3 days., Disp: 9 tablet, Rfl: 0   acetaminophen (TYLENOL) 500 MG tablet, Take 1,000 mg by mouth every 6 (six) hours as needed for moderate pain or mild pain (Knee and back)., Disp: , Rfl:    amoxicillin (AMOXIL) 500 MG tablet, Take 2,000 mg by mouth See admin instructions. Take prior to dental visits. (Patient not taking: Reported on 10/12/2022), Disp: , Rfl:    apixaban (ELIQUIS) 5 MG TABS tablet, Take 1 tablet (5 mg total) by mouth 2 (two) times daily., Disp: 60 tablet, Rfl: 6   atorvastatin (LIPITOR) 40 MG tablet, Take 1 tablet (40 mg total) by mouth daily. (Patient taking differently: Take 40 mg by mouth at bedtime.), Disp: 90 tablet, Rfl: 3   diclofenac Sodium (VOLTAREN) 1 % GEL, Apply 1 application  topically daily as needed (pain)., Disp: , Rfl:    docusate sodium (COLACE) 250 MG capsule, Take 250 mg by mouth daily., Disp: , Rfl:    furosemide (LASIX) 20 MG tablet, Take 1 tablet (20 mg total) by mouth daily. As needed., Disp: 30 tablet, Rfl: 3   gabapentin (NEURONTIN) 300 MG capsule, Take 300 mg by mouth 6 (six) times  daily., Disp: , Rfl:    Glycerin-Hypromellose-PEG 400 (DRY EYE RELIEF DROPS) 0.2-0.2-1 % SOLN, Place 1 drop into both eyes daily as needed (Dry eye)., Disp: , Rfl:    levothyroxine (SYNTHROID) 50 MCG tablet, Take 50 mcg by mouth daily before breakfast., Disp: , Rfl:    Melatonin 10 MG TABS, Take 10 mg by mouth at bedtime., Disp: , Rfl:    Multiple Vitamins-Minerals (ONE-A-DAY MENS 50+ ADVANTAGE) TABS, Take 1 tablet by mouth daily with breakfast., Disp: , Rfl:    Omega-3 Fatty Acids (FISH OIL OMEGA-3 PO), Take 2 capsules by mouth daily. 980 mg per cap, Disp: , Rfl:    oxybutynin (DITROPAN) 5 MG tablet, Take 5 mg by mouth at bedtime., Disp: , Rfl:     polyethylene glycol (MIRALAX / GLYCOLAX) 17 g packet, Take 8.5 g by mouth daily as needed for mild constipation or moderate constipation., Disp: , Rfl:    sodium chloride (OCEAN) 0.65 % SOLN nasal spray, Place 1 spray into both nostrils 4 (four) times daily., Disp: , Rfl:    Tamsulosin HCl (FLOMAX) 0.4 MG CAPS, Take 0.4 mg by mouth at bedtime., Disp: , Rfl:    testosterone cypionate (DEPOTESTOSTERONE CYPIONATE) 200 MG/ML injection, Inject 100 mg into the muscle once a week., Disp: , Rfl:   Past Medical Problems: Past Medical History:  Diagnosis Date   Arthritis    "right hand; back" (08/30/2015)   Cancer (HCC)    skin  squamous and basal   Chronic lower back pain    CKD (chronic kidney disease), stage III (HCC)    Stage III   COPD (chronic obstructive pulmonary disease) (HCC)    no home O2   Coronary artery disease    Dysplastic colon polyp age 62   carcinoma in situ   GERD (gastroesophageal reflux disease)    occ   H/O hiatal hernia    Hyperlipidemia    Hypertension    Prostate cancer (HCC) 07/2014   active surveillance, Glisson 6    Past Surgical History: Past Surgical History:  Procedure Laterality Date   A-FLUTTER ABLATION N/A 08/10/2022   Procedure: A-FLUTTER ABLATION;  Surgeon: Mealor, [name], MD;  Location: [facility] INVASIVE CV LAB;  Service: Cardiovascular;  Laterality: N/A;   CARDIAC CATHETERIZATION     CATARACT EXTRACTION W/ INTRAOCULAR LENS IMPLANT Left 08/27/2015   CORONARY ARTERY BYPASS GRAFT N/A 08/14/2016   Procedure: CORONARY ARTERY BYPASS GRAFTING (CABG)x2, using left internal mammary artery and right greater saphenous vein harvested endoscopically;  Surgeon: Alleen Borne, MD;  Location: [facility] OR;  Service: Open Heart Surgery;  Laterality: N/A;   JOINT REPLACEMENT     LEFT HEART CATH AND CORONARY ANGIOGRAPHY N/A 08/11/2016   Procedure: Left Heart Cath and Coronary Angiography;  Surgeon: Lyn Records, MD;  Location[facility] INVAS[facility] CV LAB;  Service: Cardiovascular;   Laterality: N/A;   MOHS SURGERY     multiple SCC   PROSTATE BIOPSY  07/18/2014   TEE WITHOUT CARDIOVERSION N/A 08/14/2016   Procedure: TRANSESOPHAGEAL ECHOCARDIOGRAM (TEE);  Surgeon: Alleen Borne, MD;  Location: [facility] OR;  Service: Open Heart Surgery;  Laterality: N/A;   TONSILLECTOMY     TOTAL HIP ARTHROPLASTY  05/26/2011   Procedure: TOTAL HIP ARTHROPLASTY;  Surgeon: Valeria Batman, MD;  Location: [facility] OR;  Service: Orthopedics;  Laterality: Right;   TOTAL HIP ARTHROPLASTY Left 12/25/2021   Procedure: LEFT TOTAL HIP ARTHROPLASTY ANTERIOR APPROACH;  Surgeon: Kathryne Hitch, MD;  Location: [facility] ORS;  Service: Orthopedics;  Laterality: Left;   TOTAL SHOULDER ARTHROPLASTY Right 06/18/2020   Procedure: TOTAL SHOULDER ARTHROPLASTY;  Surgeon: Teryl Lucy, MD;  Location: [facility] ORS;  Service: Orthopedics;  Laterality: Right;   ULTRASOUND GUIDANCE FOR VASCULAR ACCESS  08/11/2016   Procedure: Ultrasound Guidance For Vascular Access;  Surgeon: Lyn Records, MD;  Location: [facility] INVASIVE CV LAB;  Service: Cardiovascular;;    Social History: Social History   Socioeconomic History   Marital status: Married    Spouse name: Not on file   Number of children: Not on file   Years of education: Not on file   Highest education level: Not on file  Occupational History   Occupation: retired 1996  Tobacco Use   Smoking status: Former    Current packs/day: 0.00    Average packs/day: 0.5 packs/day for 25.0 years (12.5 ttl pk-yrs)    Types: Cigarettes    Start date: 05/25/1963    Quit date: 05/24/1988    Years since quitting: 34.4   Smokeless tobacco: Never  Vaping Use   Vaping status: Never Used  Substance and Sexual Activity   Alcohol use: Yes    Alcohol/week: 10.0 standard drinks of alcohol    Types: 5 Glasses of wine, 5 Cans of beer per week    Comment: daily   Drug use: No   Sexual activity: Not Currently  Other Topics Concern   Not on file  Social History Narrative   Not on file    Social Determinants of Health   Financial Resource Strain: Not on file  Food Insecurity: No Food Insecurity (12/26/2021)   Hunger Vital Sign    Worried About Running Out of Food in the Last Year: Never true    Ran Out of Food in the Last Year: Never true  Transportation Needs: No Transportation Needs (12/26/2021)   PRAPARE - Administrator, Civil Service (Medical): No    Lack of Transportation (Non-Medical): No  Physical Activity: Not on file  Stress: Not on file  Social Connections: Not on file  Intimate Partner Violence: Not At Risk (12/26/2021)   Humiliation, Afraid, Rape, and Kick questionnaire    Fear of Current or Ex-Partner: No    Emotionally Abused: No    Physically Abused: No    Sexually Abused: No    Family History: Family History  Problem Relation Age of Onset   Breast cancer Mother 15       mastectomy   Diabetes Father    Breast cancer Daughter        2005. Lumpectomy with chemo and radiation. Under the care of Dr. Darnelle Catalan     Pancreatic cancer Neg Hx    Colon cancer Neg Hx    Prostate cancer Neg Hx     Review of Systems: Review of Systems  Constitutional: Negative.   Cardiovascular: Negative.   Gastrointestinal: Negative.   Musculoskeletal: Negative.   Neurological: Negative.     Physical Exam: Vital Signs BP 122/75 (BP Location: Left Arm, Patient Position: Sitting, Cuff Size: Large)   Pulse 98   SpO2 92%   Physical Exam  Constitutional:      General: Not in acute distress.    Appearance: Normal appearance. Not ill-appearing.  HENT:     Head: Normocephalic and atraumatic.  Eyes:     Pupils: Pupils are equal, round.  Dermatochalasis noted Neck:     Musculoskeletal: Normal range of motion.  Cardiovascular:     Rate and Rhythm: Normal rate  Pulses: Normal pulses.  Pulmonary:     Effort: Pulmonary effort is normal. No respiratory distress.  Abdominal:     General: Abdomen is flat. There is no distension.  Musculoskeletal:  Normal range of motion.  Skin:    General: Skin is warm and dry.     Findings: No erythema or rash.  Neurological:     General: No focal deficit present.     Mental Status: Alert and oriented to person, place, and time. Mental status is at baseline.     Motor: No weakness.  Psychiatric:        Mood and Affect: Mood normal.        Behavior: Behavior normal.    Assessment/Plan: The patient is scheduled for upper eyelid blepharoplasty with Dr. Ulice Bold.  Risks, benefits, and alternatives of procedure discussed, questions answered and consent obtained.    Smoking Status: Quit 30 years ago; Counseling Given?  N/A  Caprini Score: 9, highest; Risk Factors include: Age, BMI greater than 25, history of DVT, COPD, and length of planned surgery. Recommendation for mechanical and possible pharmacological prophylaxis. Encourage early ambulation.  Patient is currently on Eliquis which she is going to hold 2 days prior to surgery and will restart the day following surgery.  Pictures obtained: @consult   Post-op Rx sent to pharmacy:  Tramadol, Zofran, Keflex  Patient was provided with the General Surgical Risk consent document and Pain Medication Agreement prior to their appointment.  They had adequate time to read through the risk consent documents and Pain Medication Agreement. We also discussed them in person together during this preop appointment. All of their questions were answered to their satisfaction.  Recommended calling if they have any further questions.  Risk consent form and Pain Medication Agreement to be scanned into patient's chart.  The risks that can be encountered with and after a blepharoplasty were discussed and include the following but no limited to these:  Asymmetry, dry eyes, lid lag, sensitivity to sun or bright light, difficulty closing your eyes, outward rolling of the eyelid, change in vision, fluid accumulation, firmness of the area, fat necrosis with death of fat tissue,  bleeding, infection, delayed healing, anesthesia risks, skin sensation changes, injury to structures including nerves, blood vessels, and muscles which may be temporary or permanent, hair loss, allergies to tape, suture materials and glues, blood products, topical preparations or injected agents, skin and contour irregularities, skin discoloration and swelling, deep vein thrombosis, cardiac and pulmonary complications, pain, which may persist, persistent pain, recurrence, poor healing of the incision, possible need for revisional surgery or staged procedures. Thiere can also be persistent swelling, poor wound healing, rippling or loose skin, swelling. Any change in weight fluctuations can alter the outcome.  Per EMR patient did receive a note from [facility] heart care that patient can hold Eliquis 2 days prior to his procedure and will not need bridging with Lovenox.  He is also scheduled to see [facility] heart care for additional preoperative appointment in a few days on 10/30/2022.  Patient was aware of this.  We discussed holding any supplements such as his fish oil and multivitamin 2 weeks prior to procedure to decrease risk of bleeding and limit bruising postoperatively.   Electronically signed by: Kermit Balo Johnita Palleschi, PA-C 10/26/2022 2:31 PM

## 2022-10-30 ENCOUNTER — Emergency Department (HOSPITAL_BASED_OUTPATIENT_CLINIC_OR_DEPARTMENT_OTHER)
Admission: EM | Admit: 2022-10-30 | Discharge: 2022-10-30 | Discharge status: Against Medical Advice | Payer: Medicare Other | Attending: Emergency Medicine | Admitting: Emergency Medicine

## 2022-10-30 ENCOUNTER — Other Ambulatory Visit: Payer: Self-pay

## 2022-10-30 ENCOUNTER — Ambulatory Visit: Payer: Medicare Other | Attending: Cardiology | Admitting: Nurse Practitioner

## 2022-10-30 ENCOUNTER — Encounter (HOSPITAL_COMMUNITY): Payer: Self-pay

## 2022-10-30 ENCOUNTER — Encounter (HOSPITAL_BASED_OUTPATIENT_CLINIC_OR_DEPARTMENT_OTHER): Payer: Self-pay | Admitting: Emergency Medicine

## 2022-10-30 ENCOUNTER — Inpatient Hospital Stay (HOSPITAL_COMMUNITY)
Admission: EM | Admit: 2022-10-30 | Discharge: 2022-11-03 | DRG: 193 | Disposition: A | Discharge status: Home / Self Care | Payer: Medicare Other | Attending: Internal Medicine | Admitting: Internal Medicine

## 2022-10-30 ENCOUNTER — Emergency Department (HOSPITAL_BASED_OUTPATIENT_CLINIC_OR_DEPARTMENT_OTHER): Payer: Medicare Other

## 2022-10-30 ENCOUNTER — Emergency Department (HOSPITAL_COMMUNITY): Payer: Medicare Other

## 2022-10-30 DIAGNOSIS — R059 Cough, unspecified: Secondary | ICD-10-CM | POA: Diagnosis not present

## 2022-10-30 DIAGNOSIS — Z5329 Procedure and treatment not carried out because of patient's decision for other reasons: Secondary | ICD-10-CM | POA: Insufficient documentation

## 2022-10-30 DIAGNOSIS — I509 Heart failure, unspecified: Secondary | ICD-10-CM | POA: Diagnosis not present

## 2022-10-30 DIAGNOSIS — Z951 Presence of aortocoronary bypass graft: Secondary | ICD-10-CM

## 2022-10-30 DIAGNOSIS — I480 Paroxysmal atrial fibrillation: Secondary | ICD-10-CM | POA: Diagnosis not present

## 2022-10-30 DIAGNOSIS — J159 Unspecified bacterial pneumonia: Secondary | ICD-10-CM | POA: Diagnosis not present

## 2022-10-30 DIAGNOSIS — J811 Chronic pulmonary edema: Secondary | ICD-10-CM | POA: Diagnosis not present

## 2022-10-30 DIAGNOSIS — J810 Acute pulmonary edema: Secondary | ICD-10-CM | POA: Insufficient documentation

## 2022-10-30 DIAGNOSIS — N1831 Chronic kidney disease, stage 3a: Secondary | ICD-10-CM | POA: Diagnosis not present

## 2022-10-30 DIAGNOSIS — J441 Chronic obstructive pulmonary disease with (acute) exacerbation: Secondary | ICD-10-CM | POA: Diagnosis present

## 2022-10-30 DIAGNOSIS — I5033 Acute on chronic diastolic (congestive) heart failure: Secondary | ICD-10-CM | POA: Insufficient documentation

## 2022-10-30 DIAGNOSIS — K219 Gastro-esophageal reflux disease without esophagitis: Secondary | ICD-10-CM | POA: Diagnosis not present

## 2022-10-30 DIAGNOSIS — Z8546 Personal history of malignant neoplasm of prostate: Secondary | ICD-10-CM

## 2022-10-30 DIAGNOSIS — I129 Hypertensive chronic kidney disease with stage 1 through stage 4 chronic kidney disease, or unspecified chronic kidney disease: Secondary | ICD-10-CM | POA: Insufficient documentation

## 2022-10-30 DIAGNOSIS — Z1152 Encounter for screening for COVID-19: Secondary | ICD-10-CM | POA: Insufficient documentation

## 2022-10-30 DIAGNOSIS — M545 Low back pain, unspecified: Secondary | ICD-10-CM | POA: Diagnosis present

## 2022-10-30 DIAGNOSIS — I25118 Atherosclerotic heart disease of native coronary artery with other forms of angina pectoris: Secondary | ICD-10-CM | POA: Diagnosis present

## 2022-10-30 DIAGNOSIS — Z7901 Long term (current) use of anticoagulants: Secondary | ICD-10-CM

## 2022-10-30 DIAGNOSIS — Z79890 Hormone replacement therapy (postmenopausal): Secondary | ICD-10-CM

## 2022-10-30 DIAGNOSIS — I110 Hypertensive heart disease with heart failure: Secondary | ICD-10-CM | POA: Diagnosis not present

## 2022-10-30 DIAGNOSIS — I4892 Unspecified atrial flutter: Secondary | ICD-10-CM | POA: Diagnosis present

## 2022-10-30 DIAGNOSIS — T501X6A Underdosing of loop [high-ceiling] diuretics, initial encounter: Secondary | ICD-10-CM | POA: Diagnosis present

## 2022-10-30 DIAGNOSIS — G8929 Other chronic pain: Secondary | ICD-10-CM | POA: Diagnosis not present

## 2022-10-30 DIAGNOSIS — Z79899 Other long term (current) drug therapy: Secondary | ICD-10-CM | POA: Diagnosis not present

## 2022-10-30 DIAGNOSIS — J449 Chronic obstructive pulmonary disease, unspecified: Secondary | ICD-10-CM | POA: Insufficient documentation

## 2022-10-30 DIAGNOSIS — J189 Pneumonia, unspecified organism: Secondary | ICD-10-CM | POA: Insufficient documentation

## 2022-10-30 DIAGNOSIS — I2723 Pulmonary hypertension due to lung diseases and hypoxia: Secondary | ICD-10-CM | POA: Diagnosis present

## 2022-10-30 DIAGNOSIS — I959 Hypotension, unspecified: Secondary | ICD-10-CM | POA: Diagnosis not present

## 2022-10-30 DIAGNOSIS — I4510 Unspecified right bundle-branch block: Secondary | ICD-10-CM | POA: Diagnosis present

## 2022-10-30 DIAGNOSIS — J9811 Atelectasis: Secondary | ICD-10-CM | POA: Diagnosis not present

## 2022-10-30 DIAGNOSIS — I2510 Atherosclerotic heart disease of native coronary artery without angina pectoris: Secondary | ICD-10-CM | POA: Insufficient documentation

## 2022-10-30 DIAGNOSIS — R0602 Shortness of breath: Secondary | ICD-10-CM | POA: Diagnosis not present

## 2022-10-30 DIAGNOSIS — Z803 Family history of malignant neoplasm of breast: Secondary | ICD-10-CM

## 2022-10-30 DIAGNOSIS — Z96642 Presence of left artificial hip joint: Secondary | ICD-10-CM | POA: Diagnosis present

## 2022-10-30 DIAGNOSIS — N189 Chronic kidney disease, unspecified: Secondary | ICD-10-CM | POA: Insufficient documentation

## 2022-10-30 DIAGNOSIS — Z01810 Encounter for preprocedural cardiovascular examination: Secondary | ICD-10-CM | POA: Diagnosis not present

## 2022-10-30 DIAGNOSIS — Z96611 Presence of right artificial shoulder joint: Secondary | ICD-10-CM | POA: Diagnosis not present

## 2022-10-30 DIAGNOSIS — I5031 Acute diastolic (congestive) heart failure: Secondary | ICD-10-CM | POA: Diagnosis not present

## 2022-10-30 DIAGNOSIS — Z91148 Patient's other noncompliance with medication regimen for other reason: Secondary | ICD-10-CM

## 2022-10-30 DIAGNOSIS — Z471 Aftercare following joint replacement surgery: Secondary | ICD-10-CM | POA: Diagnosis not present

## 2022-10-30 DIAGNOSIS — E039 Hypothyroidism, unspecified: Secondary | ICD-10-CM | POA: Diagnosis not present

## 2022-10-30 DIAGNOSIS — Z87891 Personal history of nicotine dependence: Secondary | ICD-10-CM

## 2022-10-30 DIAGNOSIS — J440 Chronic obstructive pulmonary disease with acute lower respiratory infection: Secondary | ICD-10-CM | POA: Diagnosis present

## 2022-10-30 DIAGNOSIS — I130 Hypertensive heart and chronic kidney disease with heart failure and stage 1 through stage 4 chronic kidney disease, or unspecified chronic kidney disease: Secondary | ICD-10-CM | POA: Diagnosis present

## 2022-10-30 DIAGNOSIS — J9601 Acute respiratory failure with hypoxia: Secondary | ICD-10-CM | POA: Diagnosis not present

## 2022-10-30 DIAGNOSIS — R0989 Other specified symptoms and signs involving the circulatory and respiratory systems: Secondary | ICD-10-CM | POA: Diagnosis not present

## 2022-10-30 DIAGNOSIS — E785 Hyperlipidemia, unspecified: Secondary | ICD-10-CM | POA: Diagnosis not present

## 2022-10-30 DIAGNOSIS — Z833 Family history of diabetes mellitus: Secondary | ICD-10-CM

## 2022-10-30 DIAGNOSIS — Z7984 Long term (current) use of oral hypoglycemic drugs: Secondary | ICD-10-CM

## 2022-10-30 LAB — COMPREHENSIVE METABOLIC PANEL
ALT: 22 U/L (ref 0–44)
AST: 27 U/L (ref 15–41)
Albumin: 3 g/dL — ABNORMAL LOW (ref 3.5–5.0)
Alkaline Phosphatase: 68 U/L (ref 38–126)
Anion gap: 11 (ref 5–15)
BUN: 22 mg/dL (ref 8–23)
CO2: 22 mmol/L (ref 22–32)
Calcium: 8.8 mg/dL — ABNORMAL LOW (ref 8.9–10.3)
Chloride: 101 mmol/L (ref 98–111)
Creatinine, Ser: 1.27 mg/dL — ABNORMAL HIGH (ref 0.61–1.24)
GFR, Estimated: 54 mL/min — ABNORMAL LOW (ref >60)
Glucose, Bld: 138 mg/dL — ABNORMAL HIGH (ref 70–99)
Potassium: 4 mmol/L (ref 3.5–5.1)
Sodium: 134 mmol/L — ABNORMAL LOW (ref 135–145)
Total Bilirubin: 1.1 mg/dL (ref 0.3–1.2)
Total Protein: 6.7 g/dL (ref 6.5–8.1)

## 2022-10-30 LAB — BLOOD GAS, VENOUS
Acid-Base Excess: 2 mmol/L (ref 0.0–2.0)
Bicarbonate: 27.8 mmol/L (ref 20.0–28.0)
O2 Saturation: 32 %
Patient temperature: 37
pCO2, Ven: 47 mmHg (ref 44–60)
pH, Ven: 7.38 (ref 7.25–7.43)
pO2, Ven: <31 mmHg — CL (ref 32–45)

## 2022-10-30 LAB — CBC WITH DIFFERENTIAL/PLATELET
Abs Immature Granulocytes: 0.03 10*3/uL (ref 0.00–0.07)
Basophils Absolute: 0.1 10*3/uL (ref 0.0–0.1)
Basophils Relative: 1 %
Eosinophils Absolute: 0.4 10*3/uL (ref 0.0–0.5)
Eosinophils Relative: 6 %
HCT: 48.2 % (ref 39.0–52.0)
Hemoglobin: 16 g/dL (ref 13.0–17.0)
Immature Granulocytes: 0 %
Lymphocytes Relative: 10 %
Lymphs Abs: 0.7 10*3/uL (ref 0.7–4.0)
MCH: 30.7 pg (ref 26.0–34.0)
MCHC: 33.2 g/dL (ref 30.0–36.0)
MCV: 92.3 fL (ref 80.0–100.0)
Monocytes Absolute: 1.1 10*3/uL — ABNORMAL HIGH (ref 0.1–1.0)
Monocytes Relative: 14 %
Neutro Abs: 5 10*3/uL (ref 1.7–7.7)
Neutrophils Relative %: 69 %
Platelets: 199 10*3/uL (ref 150–400)
RBC: 5.22 MIL/uL (ref 4.22–5.81)
RDW: 13.9 % (ref 11.5–15.5)
WBC: 7.3 10*3/uL (ref 4.0–10.5)
nRBC: 0 % (ref 0.0–0.2)

## 2022-10-30 LAB — RESP PANEL BY RT-PCR (RSV, FLU A&B, COVID)  RVPGX2
Influenza A by PCR: NEGATIVE
Influenza B by PCR: NEGATIVE
Resp Syncytial Virus by PCR: NEGATIVE
SARS Coronavirus 2 by RT PCR: NEGATIVE

## 2022-10-30 LAB — BRAIN NATRIURETIC PEPTIDE: B Natriuretic Peptide: 468.8 pg/mL — ABNORMAL HIGH (ref 0.0–100.0)

## 2022-10-30 LAB — TROPONIN I (HIGH SENSITIVITY)
Troponin I (High Sensitivity): 11 ng/L (ref <18)
Troponin I (High Sensitivity): 12 ng/L (ref <18)

## 2022-10-30 MED ORDER — SENNOSIDES-DOCUSATE SODIUM 8.6-50 MG PO TABS: 1.0000 | ORAL_TABLET | Freq: Every evening | ORAL | Status: DC | PRN

## 2022-10-30 MED ORDER — APIXABAN 5 MG PO TABS
5.0000 mg | ORAL_TABLET | Freq: Two times a day (BID) | ORAL | Status: DC
  Administered 2022-10-31 – 2022-11-03 (×8): 5 mg via ORAL
  Filled 2022-10-30 (×8): qty 1

## 2022-10-30 MED ORDER — MAGNESIUM SULFATE 2 GM/50ML IV SOLN
2.0000 g | Freq: Once | INTRAVENOUS | Status: AC
  Administered 2022-10-30: 2 g via INTRAVENOUS
  Filled 2022-10-30: qty 50

## 2022-10-30 MED ORDER — LEVOTHYROXINE SODIUM 50 MCG PO TABS
50.0000 ug | ORAL_TABLET | Freq: Every day | ORAL | Status: DC
  Administered 2022-10-31 – 2022-11-03 (×4): 50 ug via ORAL
  Filled 2022-10-30 (×4): qty 1

## 2022-10-30 MED ORDER — AMOXICILLIN-POT CLAVULANATE 875-125 MG PO TABS: 1.0000 | ORAL_TABLET | Freq: Two times a day (BID) | ORAL | 0 refills | Status: DC

## 2022-10-30 MED ORDER — ONDANSETRON HCL 4 MG/2ML IJ SOLN: 4.0000 mg | Freq: Four times a day (QID) | INTRAMUSCULAR | Status: DC | PRN

## 2022-10-30 MED ORDER — ATORVASTATIN CALCIUM 40 MG PO TABS
40.0000 mg | ORAL_TABLET | Freq: Every day | ORAL | Status: DC
  Administered 2022-10-31 – 2022-11-02 (×4): 40 mg via ORAL
  Filled 2022-10-30 (×4): qty 1

## 2022-10-30 MED ORDER — IPRATROPIUM-ALBUTEROL 0.5-2.5 (3) MG/3ML IN SOLN
3.0000 mL | Freq: Once | RESPIRATORY_TRACT | Status: AC
  Administered 2022-10-30: 3 mL via RESPIRATORY_TRACT
  Filled 2022-10-30: qty 3

## 2022-10-30 MED ORDER — FUROSEMIDE 10 MG/ML IJ SOLN
40.0000 mg | Freq: Two times a day (BID) | INTRAMUSCULAR | Status: DC
  Filled 2022-10-30: qty 4

## 2022-10-30 MED ORDER — OXYBUTYNIN CHLORIDE 5 MG PO TABS
5.0000 mg | ORAL_TABLET | Freq: Every day | ORAL | Status: DC
  Administered 2022-10-31 – 2022-11-02 (×4): 5 mg via ORAL
  Filled 2022-10-30 (×4): qty 1

## 2022-10-30 MED ORDER — ACETAMINOPHEN 325 MG PO TABS: 650.0000 mg | ORAL_TABLET | Freq: Four times a day (QID) | ORAL | Status: DC | PRN

## 2022-10-30 MED ORDER — IPRATROPIUM BROMIDE 0.02 % IN SOLN
1.0000 mg | Freq: Once | RESPIRATORY_TRACT | Status: AC
  Administered 2022-10-30: 1 mg via RESPIRATORY_TRACT
  Filled 2022-10-30: qty 5

## 2022-10-30 MED ORDER — FUROSEMIDE 10 MG/ML IJ SOLN
40.0000 mg | Freq: Once | INTRAMUSCULAR | Status: AC
  Administered 2022-10-30: 40 mg via INTRAVENOUS
  Filled 2022-10-30: qty 4

## 2022-10-30 MED ORDER — SODIUM CHLORIDE 0.9% FLUSH
3.0000 mL | Freq: Two times a day (BID) | INTRAVENOUS | Status: DC
  Administered 2022-10-31 – 2022-11-03 (×8): 3 mL via INTRAVENOUS

## 2022-10-30 MED ORDER — ALBUTEROL SULFATE (2.5 MG/3ML) 0.083% IN NEBU
INHALATION_SOLUTION | RESPIRATORY_TRACT | Status: AC
  Filled 2022-10-30: qty 18

## 2022-10-30 MED ORDER — ACETAMINOPHEN 650 MG RE SUPP: 650.0000 mg | Freq: Four times a day (QID) | RECTAL | Status: DC | PRN

## 2022-10-30 MED ORDER — ALBUTEROL SULFATE (2.5 MG/3ML) 0.083% IN NEBU
15.0000 mg | INHALATION_SOLUTION | Freq: Once | RESPIRATORY_TRACT | Status: AC
  Administered 2022-10-30: 12.5 mg via RESPIRATORY_TRACT
  Filled 2022-10-30: qty 18

## 2022-10-30 MED ORDER — TAMSULOSIN HCL 0.4 MG PO CAPS
0.4000 mg | ORAL_CAPSULE | Freq: Every day | ORAL | Status: DC
  Administered 2022-10-31 – 2022-11-02 (×4): 0.4 mg via ORAL
  Filled 2022-10-30 (×4): qty 1

## 2022-10-30 MED ORDER — AZITHROMYCIN 250 MG PO TABS: ORAL_TABLET | ORAL | 0 refills | Status: DC

## 2022-10-30 MED ORDER — METHYLPREDNISOLONE SODIUM SUCC 125 MG IJ SOLR
125.0000 mg | Freq: Once | INTRAMUSCULAR | Status: AC
  Administered 2022-10-30: 125 mg via INTRAVENOUS
  Filled 2022-10-30: qty 2

## 2022-10-30 MED ORDER — ALBUTEROL SULFATE (2.5 MG/3ML) 0.083% IN NEBU
2.5000 mg | INHALATION_SOLUTION | Freq: Four times a day (QID) | RESPIRATORY_TRACT | Status: DC | PRN
  Administered 2022-10-31: 2.5 mg via RESPIRATORY_TRACT

## 2022-10-30 MED ORDER — FUROSEMIDE 20 MG PO TABS: 20.0000 mg | ORAL_TABLET | Freq: Every day | ORAL | 0 refills | Status: DC

## 2022-10-30 MED ORDER — ONDANSETRON HCL 4 MG PO TABS: 4.0000 mg | ORAL_TABLET | Freq: Four times a day (QID) | ORAL | Status: DC | PRN

## 2022-10-30 NOTE — ED Notes (Signed)
D/c paperwork reviewed with pt, including prescriptions and follow up care.  All questions and/or concerns addressed at time of d/c.  No further needs expressed. . Pt verbalized understanding, ambulatory with personal cane to ED exit, NAD.

## 2022-10-30 NOTE — ED Triage Notes (Signed)
Ongoing cough, sob since yesterday.  Low grade fever yesterday.  Covid test last night was negative at [facility].  Denies chest pain.  Pt has sob with exertion and at rest.  Pulse ox reading 80-82%.  Pt states he runs 88-92%.  Pt using abdominal muscles with breathing.

## 2022-10-30 NOTE — ED Provider Triage Note (Signed)
Emergency Medicine Provider Triage Evaluation Note  Marc Gilbert , a 89 y.o. male  was evaluated in triage.  Pt complains of needs to be admitted for pulmonary edema and possible pneumonia.  Patient seen earlier today and was recommended admission.  Patient wanted to drive to the hospital after bringing his car home.  No complaints in triage.  Review of Systems  Positive: PNA Negative:   Physical Exam  BP 119/74 (BP Location: Right Arm)   Pulse (!) 102   Temp 98.4 F (36.9 C) (Oral)   Resp 18   Ht 6' (1.829 m)   Wt 93 kg   SpO2 90%   BMI 27.80 kg/m  Gen:   Awake, no distress   Resp:  Normal effort  MSK:   Moves extremities without difficulty  Other:    Medical Decision Making  Medically screening exam initiated at 6:03 PM.  Appropriate orders placed.  Marc Gilbert was informed that the remainder of the evaluation will be completed by another provider, this initial triage assessment does not replace that evaluation, and the importance of remaining in the ED until their evaluation is complete.  Needs admission   Marc Gilbert 10/30/22 1610

## 2022-10-30 NOTE — ED Notes (Addendum)
Ambulated pt down hallway with walker, started at 85% from room, walked to bathroom.  After finishing with toilet, sat showed 82% with 105 HR. After we walked back around the nursing station, SpO2 read 74% at lowest level.  Upon reentering room monitor showed 76-77% with 102 HR.  Upon denying more than normal SOB (showed his normal of 85-88% per iPhone), I reconnected O2 at 2LPM, currently 94% with 95 HR.   Pt was able to observe SpO2 throughout ambulation experience as well as in room, upon completion.

## 2022-10-30 NOTE — ED Provider Notes (Signed)
[place] EMERGENCY DEPARTMENT AT [facility] Provider Note   CSN: [id number] Arrival date & time: 10/30/22  1710     History  Chief Complaint  Patient presents with   Shortness of Breath    Marc Gilbert is a 89 y.o. male history of COPD, CABG, a flutter on Eliquis, here presenting with shortness of breath.  Patient states that he has been short of breath for several days.  Patient went to [facility] earlier and had an oxygen level of about 80%.  Patient was noted to be wheezing and elevated BNP of 400.  Patient was supposed to be admitted to the hospital but left AGAINST MEDICAL ADVICE.  Patient states that he did not want to come here by ambulance and instead drove himself over.  Patient is compliant with his Eliquis  The history is provided by the patient.       Home Medications Prior to Admission medications   Medication Sig Start Date End Date Taking? Authorizing Provider  acetaminophen (TYLENOL) 500 MG tablet Take 1,000 mg by mouth every 6 (six) hours as needed for moderate pain or mild pain (Knee and back).    [provider]  amoxicillin-clavulanate (AUGMENTIN) 875-125 MG tablet Take 1 tablet by mouth every 12 (twelve) hours. 10/30/22   Laurence Spates, MD  apixaban (ELIQUIS) 5 MG TABS tablet Take 1 tablet (5 mg total) by mouth 2 (two) times daily. 10/12/22   Rollene Rotunda, MD  atorvastatin (LIPITOR) 40 MG tablet Take 1 tablet (40 mg total) by mouth daily. Patient taking differently: Take 40 mg by mouth at bedtime. 11/27/21   Rollene Rotunda, MD  azithromycin (ZITHROMAX Z-PAK) 250 MG tablet Take 2 pills on the first day followed by 1 pill daily. 10/30/22   Laurence Spates, MD  diclofenac Sodium (VOLTAREN) 1 % GEL Apply 1 application  topically daily as needed (pain).    [provider]  docusate sodium (COLACE) 250 MG capsule Take 250 mg by mouth daily.    [provider]  furosemide (LASIX) 20 MG tablet Take 1 tablet (20 mg total)  by mouth daily for 10 days. 10/30/22 11/09/22  Laurence Spates, MD  gabapentin (NEURONTIN) 300 MG capsule Take 300-600 mg by mouth See admin instructions. Taking 300mg  at 4am, 600mg  at 10am, 600mg  at 4pm, and 300mg  at 9pm. 03/13/22   [provider]  Glycerin-Hypromellose-PEG 400 (DRY EYE RELIEF DROPS) 0.2-0.2-1 % SOLN Place 1 drop into both eyes daily as needed (Dry eye).    [provider]  levothyroxine (SYNTHROID) 50 MCG tablet Take 50 mcg by mouth daily before breakfast. 08/18/22   [provider]  Melatonin 10 MG TABS Take 10 mg by mouth at bedtime.    [provider]  Multiple Vitamins-Minerals (ONE-A-DAY MENS 50+ ADVANTAGE) TABS Take 1 tablet by mouth daily with breakfast.    [provider]  Omega-3 Fatty Acids (FISH OIL OMEGA-3 PO) Take 2 capsules by mouth daily. 980 mg per cap    [provider]  ondansetron (ZOFRAN) 4 MG tablet Take 1 tablet (4 mg total) by mouth every 8 (eight) hours as needed for nausea or vomiting. 10/26/22   Scheeler, Kermit Balo, PA-C  oxybutynin (DITROPAN) 5 MG tablet Take 5 mg by mouth at bedtime. 06/24/20   [provider]  polyethylene glycol (MIRALAX / GLYCOLAX) 17 g packet Take 8.5 g by mouth daily as needed for mild constipation or moderate constipation.    [provider]  sodium chloride (OCEAN) 0.65 % SOLN nasal spray Place 1 spray into both nostrils as needed for congestion.    [provider]  Tamsulosin HCl (FLOMAX) 0.4 MG CAPS Take 0.4 mg by mouth at bedtime.    [provider]  testosterone cypionate (DEPOTESTOSTERONE CYPIONATE) 200 MG/ML injection Inject 60 mg into the muscle once a week. Wednesdays; 0.3 ml 08/01/22   [provider]      Allergies    Patient has no known allergies.    Review of Systems   Review of Systems  Respiratory:  Positive for shortness of breath.   All other systems reviewed and are negative.   Physical Exam Updated Vital  Signs BP (!) 143/83 (BP Location: Left Arm)   Pulse 99   Temp 98.3 F (36.8 C) (Oral)   Resp (!) 24   Ht 6' (1.829 m)   Wt 93 kg   SpO2 (!) 85%   BMI 27.80 kg/m  Physical Exam Vitals and nursing note reviewed.  Constitutional:      Comments: Tachypneic and chronically ill  HENT:     Head: Normocephalic.     Mouth/Throat:     Mouth: Mucous membranes are moist.  Eyes:     Extraocular Movements: Extraocular movements intact.     Pupils: Pupils are equal, round, and reactive to light.  Cardiovascular:     Rate and Rhythm: Normal rate and regular rhythm.  Pulmonary:     Comments: Mild diffuse wheezing and diminished bilateral bases Abdominal:     Palpations: Abdomen is soft.  Musculoskeletal:     Cervical back: Normal range of motion and neck supple.     Comments: 1+ edema bilateral legs  Skin:    Capillary Refill: Capillary refill takes less than 2 seconds.  Neurological:     General: No focal deficit present.     Mental Status: He is alert and oriented to person, place, and time.  Psychiatric:        Mood and Affect: Mood normal.        Behavior: Behavior normal.     ED Results / Procedures / Treatments   Labs (all labs ordered are listed, but only abnormal results are displayed) Labs Reviewed  BLOOD GAS, VENOUS    EKG None  Radiology DG Chest 2 View  Result Date: 10/30/2022 CLINICAL DATA:  Cough, shortness of breath EXAM: CHEST - 2 VIEW COMPARISON:  06/14/2017 FINDINGS: Status post median sternotomy. Cardiac size at the upper limit of normal, slightly increased from the prior exam. Diffuse increased interstitial opacities. No pleural effusion or pneumothorax. No acute osseous abnormality. Status post right shoulder arthroplasty. IMPRESSION: Diffuse increased interstitial opacities, favored to represent pulmonary edema, although a superimposed infection cannot be excluded. Electronically Signed   By: Wiliam Ke M.D.   On: 10/30/2022 13:28     Procedures Procedures    Medications Ordered in ED Medications  furosemide (LASIX) injection 40 mg (has no administration in time range)  albuterol (PROVENTIL,VENTOLIN) solution continuous neb (has no administration in time range)  ipratropium (ATROVENT) nebulizer solution 1 mg (has no administration in time range)  methylPREDNISolone sodium succinate (SOLU-MEDROL) 125 mg/2 mL injection 125 mg (has no administration in time range)  magnesium sulfate IVPB 2 g 50 mL (has no administration in time range)    ED Course/ Medical Decision Making/ A&P  Medical Decision Making Marc Gilbert is a 89 y.o. male here presenting with shortness of breath and hypoxia.  Patient is hypoxic to about 85%.  When he ambulated in [facility], he desatted to about 70%.  Patient has diffuse wheezing on exam.  I think likely COPD versus CHF exacerbation.  Reviewed labs done at [facility] earlier.  Plan to give continuous nebs and Solu-Medrol and magnesium and give IV Lasix.  Since patient has new hypoxia, patient will need admission  9:40 PM Patient's pH is 7.38.  CO2 is normal.  Patient's chest x-ray showed worsening pulmonary edema.  Patient was given Lasix and Solu-Medrol and continuous neb.  Patient will be admitted for COPD and CHF exacerbation.  Problems Addressed: Acute on chronic congestive heart failure, unspecified heart failure type [facility]): acute illness or injury Chronic obstructive pulmonary disease with acute exacerbation (HCC): acute illness or injury  Amount and/or Complexity of Data Reviewed Labs: ordered. Decision-making details documented in ED Course. Radiology: ordered and independent interpretation performed. Decision-making details documented in ED Course. ECG/medicine tests: ordered.  Risk Prescription drug management. Decision regarding hospitalization.    Final Clinical Impression(s) / ED Diagnoses Final diagnoses:  None    Rx / DC  Orders ED Discharge Orders     None         Charlynne Pander, MD 10/30/22 2142

## 2022-10-30 NOTE — ED Notes (Signed)
15 in total was given of albuterol

## 2022-10-30 NOTE — ED Provider Notes (Signed)
EMERGENCY DEPARTMENT AT [facility] Provider Note   CSN: [id number] Arrival date & time: 10/30/22  1014     History  Chief Complaint  Patient presents with   Shortness of Breath    Marc Gilbert is a 89 y.o. male.   Shortness of Breath 89 year old male history of CKD, COPD, CAD, GERD, hypertension, hyperlipidemia presenting for shortness of breath.  He has had cough, fever up to 100 and generalized weakness since last night.  He was tested for COVID at his living facility and was negative.  However they were concerned because he had new crackles in his right lower lobe so sent him here for an x-ray.  He feels somewhat short of breath.  He does not have any wheezing but does have history of COPD.  No chest pain at all.  No abdominal pain or vomiting or diarrhea.  Eating and drinking normally.  No leg swelling.  No history of blood clots as far as he knows.     Home Medications Prior to Admission medications   Medication Sig Start Date End Date Taking? Authorizing Provider  acetaminophen (TYLENOL) 500 MG tablet Take 1,000 mg by mouth every 6 (six) hours as needed for moderate pain or mild pain (Knee and back).   Yes [provider]  amoxicillin-clavulanate (AUGMENTIN) 875-125 MG tablet Take 1 tablet by mouth every 12 (twelve) hours. 10/30/22  Yes Laurence Spates, MD  apixaban (ELIQUIS) 5 MG TABS tablet Take 1 tablet (5 mg total) by mouth 2 (two) times daily. 10/12/22  Yes Rollene Rotunda, MD  atorvastatin (LIPITOR) 40 MG tablet Take 1 tablet (40 mg total) by mouth daily. Patient taking differently: Take 40 mg by mouth at bedtime. 11/27/21  Yes Rollene Rotunda, MD  azithromycin (ZITHROMAX Z-PAK) 250 MG tablet Take 2 pills on the first day followed by 1 pill daily. 10/30/22  Yes Laurence Spates, MD  diclofenac Sodium (VOLTAREN) 1 % GEL Apply 1 application  topically daily as needed (pain).   Yes [provider]  docusate sodium (COLACE) 250 MG  capsule Take 250 mg by mouth daily.   Yes [provider]  furosemide (LASIX) 20 MG tablet Take 1 tablet (20 mg total) by mouth daily for 10 days. 10/30/22 11/09/22 Yes Laurence Spates, MD  gabapentin (NEURONTIN) 300 MG capsule Take 300-600 mg by mouth See admin instructions. Taking 300mg  at 4am, 600mg  at 10am, 600mg  at 4pm, and 300mg  at 9pm. 03/13/22  Yes [provider]  Glycerin-Hypromellose-PEG 400 (DRY EYE RELIEF DROPS) 0.2-0.2-1 % SOLN Place 1 drop into both eyes daily as needed (Dry eye).   Yes [provider]  levothyroxine (SYNTHROID) 50 MCG tablet Take 50 mcg by mouth daily before breakfast. 08/18/22  Yes [provider]  Melatonin 10 MG TABS Take 10 mg by mouth at bedtime.   Yes [provider]  Multiple Vitamins-Minerals (ONE-A-DAY MENS 50+ ADVANTAGE) TABS Take 1 tablet by mouth daily with breakfast.   Yes [provider]  Omega-3 Fatty Acids (FISH OIL OMEGA-3 PO) Take 2 capsules by mouth daily. 980 mg per cap   Yes [provider]  ondansetron (ZOFRAN) 4 MG tablet Take 1 tablet (4 mg total) by mouth every 8 (eight) hours as needed for nausea or vomiting. 10/26/22  Yes Scheeler, Kermit Balo, PA-C  oxybutynin (DITROPAN) 5 MG tablet Take 5 mg by mouth at bedtime. 06/24/20  Yes [provider]  polyethylene glycol (MIRALAX / GLYCOLAX) 17 g  packet Take 8.5 g by mouth daily as needed for mild constipation or moderate constipation.   Yes [provider]  sodium chloride (OCEAN) 0.65 % SOLN nasal spray Place 1 spray into both nostrils as needed for congestion.   Yes [provider]  Tamsulosin HCl (FLOMAX) 0.4 MG CAPS Take 0.4 mg by mouth at bedtime.   Yes [provider]  testosterone cypionate (DEPOTESTOSTERONE CYPIONATE) 200 MG/ML injection Inject 60 mg into the muscle once a week. Wednesdays; 0.3 ml 08/01/22  Yes [provider]      Allergies    Patient has no known allergies.    Review of  Systems   Review of Systems  Respiratory:  Positive for shortness of breath.   Review of systems completed and notable as per HPI.  ROS otherwise negative.  Physical Exam Updated Vital Signs BP 119/62   Pulse 80   Temp 99.2 F (37.3 C) (Oral)   Resp 19   Ht 6' (1.829 m)   Wt 93.4 kg   SpO2 91%   BMI 27.94 kg/m  Physical Exam Vitals and nursing note reviewed.  Constitutional:      General: He is not in acute distress.    Appearance: He is well-developed.  HENT:     Head: Normocephalic and atraumatic.  Eyes:     Conjunctiva/sclera: Conjunctivae normal.  Cardiovascular:     Rate and Rhythm: Normal rate and regular rhythm.     Heart sounds: No murmur heard. Pulmonary:     Effort: Pulmonary effort is normal. No respiratory distress.     Breath sounds: Examination of the right-lower field reveals rales. Examination of the left-lower field reveals rales. Wheezing and rales present.  Abdominal:     Palpations: Abdomen is soft.     Tenderness: There is no abdominal tenderness.  Musculoskeletal:        General: No swelling.     Cervical back: Neck supple.     Right lower leg: No tenderness. No edema.     Left lower leg: No tenderness. No edema.  Skin:    General: Skin is warm and dry.     Capillary Refill: Capillary refill takes less than 2 seconds.  Neurological:     Mental Status: He is alert.  Psychiatric:        Mood and Affect: Mood normal.     ED Results / Procedures / Treatments   Labs (all labs ordered are listed, but only abnormal results are displayed) Labs Reviewed  CBC WITH DIFFERENTIAL/PLATELET - Abnormal; Notable for the following components:      Result Value   Monocytes Absolute 1.1 (*)    All other components within normal limits  BRAIN NATRIURETIC PEPTIDE - Abnormal; Notable for the following components:   B Natriuretic Peptide 468.8 (*)    All other components within normal limits  COMPREHENSIVE METABOLIC PANEL - Abnormal; Notable for the  following components:   Sodium 134 (*)    Glucose, Bld 138 (*)    Creatinine, Ser 1.27 (*)    Calcium 8.8 (*)    Albumin 3.0 (*)    GFR, Estimated 54 (*)    All other components within normal limits  RESP PANEL BY RT-PCR (RSV, FLU A&B, COVID)  RVPGX2  TROPONIN I (HIGH SENSITIVITY)  TROPONIN I (HIGH SENSITIVITY)    EKG EKG Interpretation Date/Time:  Friday October 30 2022 10:24:57 EDT Ventricular Rate:  99 PR Interval:  180 QRS Duration:  141 QT Interval:  364  QTC Calculation: 468 R Axis:   235  Text Interpretation: Sinus rhythm Probable left atrial enlargement Right bundle branch block No significant change since last tracing Confirmed by Fulton Reek [phone]) on 10/30/2022 10:28:09 AM  Radiology DG Chest 2 View  Result Date: 10/30/2022 CLINICAL DATA:  Cough, shortness of breath EXAM: CHEST - 2 VIEW COMPARISON:  06/14/2017 FINDINGS: Status post median sternotomy. Cardiac size at the upper limit of normal, slightly increased from the prior exam. Diffuse increased interstitial opacities. No pleural effusion or pneumothorax. No acute osseous abnormality. Status post right shoulder arthroplasty. IMPRESSION: Diffuse increased interstitial opacities, favored to represent pulmonary edema, although a superimposed infection cannot be excluded. Electronically Signed   By: Wiliam Ke M.D.   On: 10/30/2022 13:28    Procedures Procedures    Medications Ordered in ED Medications  ipratropium-albuterol (DUONEB) 0.5-2.5 (3) MG/3ML nebulizer solution 3 mL (3 mLs Nebulization Given 10/30/22 1107)    ED Course/ Medical Decision Making/ A&P                                 Medical Decision Making Amount and/or Complexity of Data Reviewed Labs: ordered. Radiology: ordered.  Risk Prescription drug management.   Medical Decision Making:   JACOBANTHONY BILBO is a 89 y.o. male who presented to the ED today with shortness of breath, cough.  Patient is new to their ox requirement here.  He  has mild wheezing bilaterally, could be component of COPD.  He also some bibasilar rales.  Does not appear grossly volume overloaded, consider heart failure though.  No chest pain, lower concern for ACS and EKG is unchanged from prior.  Will obtain chest x-ray to evaluate for pneumonia, pulmonary edema as well as lab workup.  Low suspicion for PE given no DVT symptoms or other acute risk factors with Rales and wheezing on exam.   Patient placed on continuous vitals and telemetry monitoring while in ED which was reviewed periodically.  Reviewed and confirmed nursing documentation for past medical history, family history, social history.  Reassessment and Plan:   X-ray reviewed, concern for pulmonary edema and possible superimposed pneumonia.  His blood work is notable for elevated BNP which is new for him and could represent new onset heart failure/with his pulmonary edema.  Renal baseline.  Sodium 134.  Troponin is negative, low concern for ACS.  COVID and flu are negative.  Patient has new 2 L oxygen requirement.  At rest he does okay with oxygen in the high 80s to low 90s which is consistent with his COPD.  He does have improvement in breathing after breathing treatment with no wheezing.  However he is still having cough and hypoxia.  When he ambulates he desats into the high 70s.  I discussed with him that I recommend he be admitted to the hospital due to concern for pneumonia and pulmonary edema with possible new onset heart failure.  He was adamant he would like to return to his independent living facility.  I told him that I was concerned that he could have worsening pneumonia, heart failure, or other undiagnosed condition causing low oxygen.  He does not have oxygen at home, and I warned him that if he has low oxygen like that at home he could pass out and potentially die or require prolonged hospitalization that caused permanent disability or death.  He voiced understanding of this and has capacity  make this decision.  He decided to leave AGAINST MEDICAL ADVICE.  I will start him on Lasix as well as an antibiotic for possible pneumonia.  I recommend he call his doctor today or Monday to schedule close follow-up given strict return precautions and recommend he return if he decides to seek care.   Patient's presentation is most consistent with acute presentation with potential threat to life or bodily function.           Final Clinical Impression(s) / ED Diagnoses Final diagnoses:  Acute pulmonary edema (HCC)  Community acquired pneumonia, unspecified laterality    Rx / DC Orders ED Discharge Orders          Ordered    furosemide (LASIX) 20 MG tablet  Daily        10/30/22 1415    amoxicillin-clavulanate (AUGMENTIN) 875-125 MG tablet  Every 12 hours        10/30/22 1415    azithromycin (ZITHROMAX Z-PAK) 250 MG tablet        10/30/22 1415              Laurence Spates, MD 10/30/22 1415

## 2022-10-30 NOTE — Progress Notes (Addendum)
Virtual Visit via Telephone Note   Because of Marc Gilbert's co-morbid illnesses, he is at least at moderate risk for complications without adequate follow up.  This format is felt to be most appropriate for this patient at this time.  The patient did not have access to video technology/had technical difficulties with video requiring transitioning to audio format only (telephone).  All issues noted in this document were discussed and addressed.  No physical exam could be performed with this format.  Please refer to the patient's chart for his consent to telehealth for [facility].  Evaluation Performed:  Preoperative cardiovascular risk assessment _____________   Date:  10/30/2022   Patient ID:  Marc Gilbert, DOB 05-18-1930, MRN [id number] Patient Location:  Home Provider location:   Office  Primary Care Provider:  Collene Mares, [place] Primary Cardiologist:  Rollene Rotunda, MD  Chief Complaint / Patient Profile   89 y.o. y/o male with a h/o CAD s/p CABG x 2 (LIMA-LAD, SVG-OM) in 2018, persistent atrial fibrillation/atrial flutter, pauses, chronic diastolic heart failure, DVT, hypertension, hyperlipidemia, CKD stage III, COPD, prostate cancer, and GERD who is pending Bilateral Lid Bleopharoplasty on 11/18/2022 with Dr. Foster Simpson of [facility] and presents today for telephonic preoperative cardiovascular risk assessment.  History of Present Illness    [name] is a 89 y.o. male who presents via audio/video conferencing for a telehealth visit today.  Pt was last seen in cardiology clinic on 09/14/2022 by Dr. Nelly Laurence.  At that time Marc Gilbert was doing well.  The patient is now pending procedure as outlined above. Since his last visit, he has been stable from a cardiac standpoint.   He denies chest pain, palpitations, dyspnea, pnd, orthopnea, n, v, dizziness, syncope, edema, weight gain, or early satiety. All other systems reviewed  and are otherwise negative except as noted above.   Past Medical History    Past Medical History:  Diagnosis Date   Arthritis    "right hand; back" (08/30/2015)   Cancer (HCC)    skin  squamous and basal   Chronic lower back pain    CKD (chronic kidney disease), stage III (HCC)    Stage III   COPD (chronic obstructive pulmonary disease) (HCC)    no home O2   Coronary artery disease    Dysplastic colon polyp age 66   carcinoma in situ   GERD (gastroesophageal reflux disease)    occ   H/O hiatal hernia    Hyperlipidemia    Hypertension    Prostate cancer (HCC) 07/2014   active surveillance, Glisson 6   Past Surgical History:  Procedure Laterality Date   A-FLUTTER ABLATION N/A 08/10/2022   Procedure: A-FLUTTER ABLATION;  Surgeon: Mealor, [name], MD;  Location: [facility] INVASIVE CV LAB;  Service: Cardiovascular;  Laterality: N/A;   CARDIAC CATHETERIZATION     CATARACT EXTRACTION W/ INTRAOCULAR LENS IMPLANT Left 08/27/2015   CORONARY ARTERY BYPASS GRAFT N/A 08/14/2016   Procedure: CORONARY ARTERY BYPASS GRAFTING (CABG)x2, using left internal mammary artery and right greater saphenous vein harvested endoscopically;  Surgeon: Alleen Borne, MD;  Location: [facility] OR;  Service: Open Heart Surgery;  Laterality: N/A;   JOINT REPLACEMENT     LEFT HEART CATH AND CORONARY ANGIOGRAPHY N/A 08/11/2016   Procedure: Left Heart Cath and Coronary Angiography;  Surgeon: Lyn Records, MD;  Location: [facility] INVASIVE CV LAB;  Service: Cardiovascular;  Laterality: N/A;   MOHS SURGERY  multiple SCC   PROSTATE BIOPSY  07/18/2014   TEE WITHOUT CARDIOVERSION N/A 08/14/2016   Procedure: TRANSESOPHAGEAL ECHOCARDIOGRAM (TEE);  Surgeon: Alleen Borne, MD;  [facility];  Service: Open Heart Surgery;  Laterality: N/A;   TONSILLECTOMY     TOTAL HIP ARTHROPLASTY  05/26/2011   Procedure: TOTAL HIP ARTHROPLASTY;  Surgeon: Valeria Batman, MD;  Location: [facility] OR;  Service: Orthopedics;  Laterality: Right;    TOTAL HIP ARTHROPLASTY Left 12/25/2021   Procedure: LEFT TOTAL HIP ARTHROPLASTY ANTERIOR APPROACH;  Surgeon: Kathryne Hitch, MD;  Location: [facility] ORS;  Service: Orthopedics;  Laterality: Left;   TOTAL SHOULDER ARTHROPLASTY Right 06/18/2020   Procedure: TOTAL SHOULDER ARTHROPLASTY;  Surgeon: Teryl Lucy, MD;  Location: [facility] ORS;  Service: Orthopedics;  Laterality: Right;   ULTRASOUND GUIDANCE FOR VASCULAR ACCESS  08/11/2016   Procedure: Ultrasound Guidance For Vascular Access;  Surgeon: Lyn Records, MD;  Location: [facility] INVASIVE CV LAB;  Service: Cardiovascular;;    Allergies  No Known Allergies  Home Medications    Prior to Admission medications   Medication Sig Start Date End Date Taking? Authorizing Provider  acetaminophen (TYLENOL) 500 MG tablet Take 1,000 mg by mouth every 6 (six) hours as needed for moderate pain or mild pain (Knee and back).    [provider]  amoxicillin (AMOXIL) 500 MG tablet Take 2,000 mg by mouth See admin instructions. Take prior to dental visits. Patient not taking: Reported on 10/12/2022 11/02/19   [provider]  apixaban (ELIQUIS) 5 MG TABS tablet Take 1 tablet (5 mg total) by mouth 2 (two) times daily. 10/12/22   Rollene Rotunda, MD  atorvastatin (LIPITOR) 40 MG tablet Take 1 tablet (40 mg total) by mouth daily. Patient taking differently: Take 40 mg by mouth at bedtime. 11/27/21   Rollene Rotunda, MD  diclofenac Sodium (VOLTAREN) 1 % GEL Apply 1 application  topically daily as needed (pain).    [provider]  docusate sodium (COLACE) 250 MG capsule Take 250 mg by mouth daily.    [provider]  furosemide (LASIX) 20 MG tablet Take 1 tablet (20 mg total) by mouth daily. As needed. 08/12/22 11/10/22  Rollene Rotunda, MD  gabapentin (NEURONTIN) 300 MG capsule Take 300 mg by mouth 6 (six) times daily. 03/13/22   [provider]  Glycerin-Hypromellose-PEG 400 (DRY EYE RELIEF DROPS) 0.2-0.2-1 % SOLN Place 1 drop  into both eyes daily as needed (Dry eye).    [provider]  levothyroxine (SYNTHROID) 50 MCG tablet Take 50 mcg by mouth daily before breakfast. 08/18/22   [provider]  Melatonin 10 MG TABS Take 10 mg by mouth at bedtime.    [provider]  Multiple Vitamins-Minerals (ONE-A-DAY MENS 50+ ADVANTAGE) TABS Take 1 tablet by mouth daily with breakfast.    [provider]  Omega-3 Fatty Acids (FISH OIL OMEGA-3 PO) Take 2 capsules by mouth daily. 980 mg per cap    [provider]  ondansetron (ZOFRAN) 4 MG tablet Take 1 tablet (4 mg total) by mouth every 8 (eight) hours as needed for nausea or vomiting. 10/26/22   Scheeler, Kermit Balo, PA-C  oxybutynin (DITROPAN) 5 MG tablet Take 5 mg by mouth at bedtime. 06/24/20   [provider]  polyethylene glycol (MIRALAX / GLYCOLAX) 17 g packet Take 8.5 g by mouth daily as needed for mild constipation or moderate constipation.    [provider]  sodium chloride (OCEAN) 0.65 % SOLN nasal spray Place  1 spray into both nostrils 4 (four) times daily.    [provider]  Tamsulosin HCl (FLOMAX) 0.4 MG CAPS Take 0.4 mg by mouth at bedtime.    [provider]  testosterone cypionate (DEPOTESTOSTERONE CYPIONATE) 200 MG/ML injection Inject 100 mg into the muscle once a week. 08/01/22   [provider]    Physical Exam    Vital Signs:  Josh L Delamora does not have vital signs available for review today.  Given telephonic nature of communication, physical exam is limited. AAOx3. NAD. Normal affect.  Speech and respirations are unlabored.  Accessory Clinical Findings    None  Assessment & Plan    1.  Preoperative Cardiovascular Risk Assessment:  According to the Revised Cardiac Risk Index (RCRI), his Perioperative Risk of Major Cardiac Event is (%): 6.6. His Functional Capacity in METs is: 4.4 according to the Duke Activity Status Index (DASI).  Therefore, based on ACC/AHA  guidelines, patient would be at acceptable risk for the planned procedure without further cardiovascular testing. I will route this recommendation to the requesting party via Epic fax function.  The patient was advised that if he develops new symptoms prior to surgery to contact our office to arrange for a follow-up visit, and he verbalized understanding.  Per office protocol, patient can hold Eliquis for 2 days prior to procedure.   Patient will not need bridging with Lovenox (enoxaparin) around procedure. Please resume Eliquis as soon as possible postprocedure, at the discretion of the surgeon.    A copy of this note will be routed to requesting surgeon.  Time:   Today, I have spent 5 minutes with the patient with telehealth technology discussing medical history, symptoms, and management plan.     Joylene Grapes, NP  10/30/2022, 10:01 AM

## 2022-10-30 NOTE — Discharge Instructions (Signed)
I am concerned that you have pneumonia and pulmonary edema which is concerning for new onset heart failure.  I recommended you stay in the hospital for antibiotics and further workup.  You elected to leave AGAINST MEDICAL ADVICE.  You are being started on furosemide to help with your fluid retention as well as 2 different antibiotics for pneumonia.  I recommend you follow-up with your doctor soon as possible for reevaluation.  If you develop any new or worsening symptoms you should return to the ED.

## 2022-10-30 NOTE — ED Triage Notes (Signed)
Pt seen at [facility] earlier today. [facility] transferred here for pulmonary edema and possible PNA. Pt did not want transfer. Pt is here now because PCP wanted him to come in. Hx of COPD, O2 is normally 88-92% on RA.

## 2022-10-30 NOTE — H&P (Signed)
History and Physical    Marc Gilbert ZOX:[id number] DOB: 03-23-1930 DOA: 10/30/2022  PCP: Collene Mares, PA  Patient coming from: Independent living facility  I have personally briefly reviewed patient's old medical records in [facility]  Chief Complaint: Shortness of breath  HPI: Marc Gilbert is a 89 y.o. male with medical history significant for chronic HFpEF (EF 50-55%), paroxysmal atrial fibrillation/flutter (s/p ablation 08/10/2022) on Eliquis, CAD s/p CABG, COPD, CKD stage IIIa, hypothyroidism, HLD who presented to the ED for evaluation of shortness of breath.  Patient states that he has been having about 1 week of progressive shortness of breath and cough productive of clear sputum.  His daughter visited him at his retirement facility 2 days ago and noted that he did appear short of breath and that his oxygen levels were low.  He had a low-grade fever yesterday.  He was concerned about COVID infection but tested negative.  He was advised to present to the ED yesterday but declined.  He did initially go to [facility] ED earlier today.  He was found to be hypoxic with SpO2 dropping into the high 70s on ambulation.  CXR consistent with pulmonary edema.  Admission was recommended however patient decided to leave AGAINST MEDICAL ADVICE and return to his independent living facility.  He had persistent symptoms therefore came back to the ED for further evaluation.  He reports breathing is improving after receiving Lasix and nebulizer treatments.  He has not seen any significant swelling in his extremities.  He denies chest pain, nausea, vomiting, abdominal pain.  He reports good urine output.  ED Course  Labs/Imaging on admission: I have personally reviewed following labs and imaging studies.  Initial vitals showed BP 143/83, pulse 99, RR 24, temp 98.3 F, SpO2 85% on room air.  Patient placed on 2-3 L O2 via [place] with improvement to 94%.  Labs showed WBC 7.3,  hemoglobin 16.0, platelets 199,000, sodium 134, potassium 4.0, bicarb 22, BUN 22, creatinine 1.27, serum glucose 138, LFTs within normal limits, BNP 468.8, troponin 11 > 12.  SARS-CoV-2, influenza, RSV PCR negative.  Portable chest x-ray showed worsening pulmonary edema.  Patient was given IV Lasix 40 mg, IV magnesium 2 g, IV Solu-Medrol 125 mg, albuterol/Atrovent nebulizers.  The hospitalist service was consulted to admit for further evaluation and management.  Review of Systems: All systems reviewed and are negative except as documented in history of present illness above.   Past Medical History:  Diagnosis Date   Arthritis    "right hand; back" (08/30/2015)   Cancer (HCC)    skin  squamous and basal   Chronic lower back pain    CKD (chronic kidney disease), stage III (HCC)    Stage III   COPD (chronic obstructive pulmonary disease) (HCC)    no home O2   Coronary artery disease    Dysplastic colon polyp age 39   carcinoma in situ   GERD (gastroesophageal reflux disease)    occ   H/O hiatal hernia    Hyperlipidemia    Hypertension    Prostate cancer (HCC) 07/2014   active surveillance, Glisson 6    Past Surgical History:  Procedure Laterality Date   A-FLUTTER ABLATION N/A 08/10/2022   Procedure: A-FLUTTER ABLATION;  Surgeon: Mealor, [name], MD;  Location: [facility] INVASIVE CV LAB;  Service: Cardiovascular;  Laterality: N/A;   CARDIAC CATHETERIZATION     CATARACT EXTRACTION W/ INTRAOCULAR LENS IMPLANT Left 08/27/2015   CORONARY ARTERY  BYPASS GRAFT N/A 08/14/2016   Procedure: CORONARY ARTERY BYPASS GRAFTING (CABG)x2, using left internal mammary artery and right greater saphenous vein harvested endoscopically;  Surgeon: Alleen Borne, MD;  Location: [facility] OR;  Service: Open Heart Surgery;  Laterality: N/A;   JOINT REPLACEMENT     LEFT HEART CATH AND CORONARY ANGIOGRAPHY N/A 08/11/2016   Procedure: Left Heart Cath and Coronary Angiography;  Surgeon: Lyn Records, MD;  Location:  [facility] INVASIVE CV LAB;  Service: Cardiovascular;  Laterality: N/A;   MOHS SURGERY     multiple SCC   PROSTATE BIOPSY  07/18/2014   TEE WITHOUT CARDIOVERSION N/A 08/14/2016   Procedure: TRANSESOPHAGEAL ECHOCARDIOGRAM (TEE);  Surgeon: Alleen Borne, MD;  Location[facility] OR;  Service: Open Heart Surgery;  Laterality: N/A;   TONSILLECTOMY     TOTAL HIP ARTHROPLASTY  05/26/2011   Procedure: TOTAL HIP ARTHROPLASTY;  Surgeon: Valeria Batman, MD;  Location: [facility] OR;  Service: Orthopedics;  Laterality: Right;   TOTAL HIP ARTHROPLASTY Left 12/25/2021   Procedure: LEFT TOTAL HIP ARTHROPLASTY ANTERIOR APPROACH;  Surgeon: Kathryne Hitch, MD;  Location: [facility] ORS;  Service: Orthopedics;  Laterality: Left;   TOTAL SHOULDER ARTHROPLASTY Right 06/18/2020   Procedure: TOTAL SHOULDER ARTHROPLASTY;  Surgeon: Teryl Lucy, MD;  Location: [facility] ORS;  Service: Orthopedics;  Laterality: Right;   ULTRASOUND GUIDANCE FOR VASCULAR ACCESS  08/11/2016   Procedure: Ultrasound Guidance For Vascular Access;  Surgeon: Lyn Records, MD;  Location: [facility] INVASIVE CV LAB;  Service: Cardiovascular;;    Social History:  reports that he quit smoking about 34 years ago. His smoking use included cigarettes. He started smoking about 59 years ago. He has a 12.5 pack-year smoking history. He has never used smokeless tobacco. He reports current alcohol use of about 10.0 standard drinks of alcohol per week. He reports that he does not use drugs.  No Known Allergies  Family History  Problem Relation Age of Onset   Breast cancer Mother 100       mastectomy   Diabetes Father    Breast cancer Daughter        2005. Lumpectomy with chemo and radiation. Under the care of Dr. Darnelle Catalan     Pancreatic cancer Neg Hx    Colon cancer Neg Hx    Prostate cancer Neg Hx      Prior to Admission medications   Medication Sig Start Date End Date Taking? Authorizing Provider  acetaminophen (TYLENOL) 500 MG tablet Take 1,000 mg by mouth every 6  (six) hours as needed for moderate pain or mild pain (Knee and back).    [provider]  amoxicillin-clavulanate (AUGMENTIN) 875-125 MG tablet Take 1 tablet by mouth every 12 (twelve) hours. 10/30/22   Laurence Spates, MD  apixaban (ELIQUIS) 5 MG TABS tablet Take 1 tablet (5 mg total) by mouth 2 (two) times daily. 10/12/22   Rollene Rotunda, MD  atorvastatin (LIPITOR) 40 MG tablet Take 1 tablet (40 mg total) by mouth daily. Patient taking differently: Take 40 mg by mouth at bedtime. 11/27/21   Rollene Rotunda, MD  azithromycin (ZITHROMAX Z-PAK) 250 MG tablet Take 2 pills on the first day followed by 1 pill daily. 10/30/22   Laurence Spates, MD  diclofenac Sodium (VOLTAREN) 1 % GEL Apply 1 application  topically daily as needed (pain).    [provider]  docusate sodium (COLACE) 250 MG capsule Take 250 mg by mouth daily.    [provider]  furosemide (LASIX) 20  MG tablet Take 1 tablet (20 mg total) by mouth daily for 10 days. 10/30/22 11/09/22  Laurence Spates, MD  gabapentin (NEURONTIN) 300 MG capsule Take 300-600 mg by mouth See admin instructions. Taking 300mg  at 4am, 600mg  at 10am, 600mg  at 4pm, and 300mg  at 9pm. 03/13/22   [provider]  Glycerin-Hypromellose-PEG 400 (DRY EYE RELIEF DROPS) 0.2-0.2-1 % SOLN Place 1 drop into both eyes daily as needed (Dry eye).    [provider]  levothyroxine (SYNTHROID) 50 MCG tablet Take 50 mcg by mouth daily before breakfast. 08/18/22   [provider]  Melatonin 10 MG TABS Take 10 mg by mouth at bedtime.    [provider]  Multiple Vitamins-Minerals (ONE-A-DAY MENS 50+ ADVANTAGE) TABS Take 1 tablet by mouth daily with breakfast.    [provider]  Omega-3 Fatty Acids (FISH OIL OMEGA-3 PO) Take 2 capsules by mouth daily. 980 mg per cap    [provider]  ondansetron (ZOFRAN) 4 MG tablet Take 1 tablet (4 mg total) by mouth every 8 (eight) hours as needed for nausea or  vomiting. 10/26/22   Scheeler, Kermit Balo, PA-C  oxybutynin (DITROPAN) 5 MG tablet Take 5 mg by mouth at bedtime. 06/24/20   [provider]  polyethylene glycol (MIRALAX / GLYCOLAX) 17 g packet Take 8.5 g by mouth daily as needed for mild constipation or moderate constipation.    [provider]  sodium chloride (OCEAN) 0.65 % SOLN nasal spray Place 1 spray into both nostrils as needed for congestion.    [provider]  Tamsulosin HCl (FLOMAX) 0.4 MG CAPS Take 0.4 mg by mouth at bedtime.    [provider]  testosterone cypionate (DEPOTESTOSTERONE CYPIONATE) 200 MG/ML injection Inject 60 mg into the muscle once a week. Wednesdays; 0.3 ml 08/01/22   [provider]    Physical Exam: Vitals:   10/30/22 1946 10/30/22 1959 10/30/22 2128 10/30/22 2130  BP:  (!) 143/83 133/80 127/83  Pulse: 98 99 (!) 101 97  Resp:  (!) 24 (!) 23 (!) 27  Temp:  98.3 F (36.8 C)    TempSrc:  Oral    SpO2: (!) 87% (!) 85% 91% 91%  Weight:      Height:       Constitutional: Resting in bed receiving albuterol nebulizer treatment.  NAD, calm, comfortable Eyes: EOMI, lids and conjunctivae normal ENMT: Mucous membranes are moist. Posterior pharynx clear of any exudate or lesions.Normal dentition.  Neck: normal, supple, no masses. Respiratory: clear to auscultation bilaterally, no wheezing, no crackles. Normal respiratory effort. No accessory muscle use.  Cardiovascular: Regular rate and rhythm, no murmurs / rubs / gallops. No extremity edema. 2+ pedal pulses. Abdomen: no tenderness, no masses palpated.  Musculoskeletal: no clubbing / cyanosis. No joint deformity upper and lower extremities. Good ROM, no contractures. Normal muscle tone.  Skin: no rashes, lesions, ulcers. No induration Neurologic: Sensation intact. Strength 5/5 in all 4.  Psychiatric: Normal judgment and insight. Alert and oriented x 3. Normal mood.   EKG: Personally reviewed. Sinus rhythm, rate 97, RBBB.   PVCs no longer present otherwise similar to prior.  Assessment/Plan Principal Problem:   Acute on chronic heart failure with preserved ejection fraction (HFpEF) (HCC) Active Problems:   Acute respiratory failure with hypoxia (HCC)   COPD (chronic obstructive pulmonary disease) (HCC)   Chronic kidney disease, stage 3a (HCC)   Coronary artery disease of native heart with stable angina pectoris (HCC)   Hypothyroidism  Paroxysmal atrial fibrillation (HCC)   Marc Gilbert is a 89 y.o. male with medical history significant for chronic HFpEF (EF 50-55%), paroxysmal atrial fibrillation/flutter (s/p ablation 08/10/2022) on Eliquis, CAD s/p CABG, COPD, CKD stage IIIa, hypothyroidism, HLD who is admitted with acute hypoxic respiratory failure due to acute on chronic HFpEF/pulmonary edema.  Assessment and Plan: Acute hypoxic respiratory failure due to acute on chronic HFpEF exacerbation: SpO2 85% on room air while at rest with new 2 L supplemental O2 requirement.  CXR shows pulmonary edema.  BNP 468.  Received IV Lasix 40 mg while in the ED.  Daughter reports approximately 1 L UOP so far, I/O's not charted. TTE 07/23/2022 showed EF 50-55%. -Continue IV Lasix 40 mg twice daily -Limited echo tomorrow -Strict I/O's and daily weights  COPD: Reportedly wheezing while in the ED.  Received IV Solu-Medrol, nebulizer treatments with improvement.  No further wheezing noted on admission.  Continue albuterol as needed.  Paroxysmal atrial fibrillation/flutter: S/p ablation 08/10/2022.  He is maintaining in sinus rhythm.  Not currently on rate/rhythm controlling agents.  Continue Eliquis.  CKD stage IIIa: Renal function stable.  Continue to monitor.  CAD s/p CABG: Denies chest pain.  Continue Eliquis and atorvastatin.  Hypothyroidism: Continue Synthroid.   DVT prophylaxis:  apixaban (ELIQUIS) tablet 5 mg   Code Status: Full code, confirmed with patient and his daughters on admission.  MOST form  reviewed. Family Communication: Daughter at bedside as well as his other daughter Marc Gilbert, retired OB/GYN) by phone Disposition Plan: From ILF and likely return to same facility pending clinical progress Consults called: None Severity of Illness: The appropriate patient status for this patient is INPATIENT. Inpatient status is judged to be reasonable and necessary in order to provide the required intensity of service to ensure the patient's safety. The patient's presenting symptoms, physical exam findings, and initial radiographic and laboratory data in the context of their chronic comorbidities is felt to place them at high risk for further clinical deterioration. Furthermore, it is not anticipated that the patient will be medically stable for discharge from the hospital within 2 midnights of admission.   * I certify that at the point of admission it is my clinical judgment that the patient will require inpatient hospital care spanning beyond 2 midnights from the point of admission due to high intensity of service, high risk for further deterioration and high frequency of surveillance required.Darreld Mclean MD [facility]  If 7PM-7AM, please contact night-coverage [email or web address]  10/30/2022, 11:45 PM

## 2022-10-30 NOTE — Hospital Course (Signed)
Marc Gilbert is a 89 y.o. male with medical history significant for chronic HFpEF (EF 50-55%), paroxysmal atrial fibrillation/flutter (s/p ablation 08/10/2022) on Eliquis, CAD s/p CABG, COPD, CKD stage IIIa, hypothyroidism, HLD who is admitted with acute hypoxic respiratory failure due to acute on chronic HFpEF/pulmonary edema.

## 2022-10-30 NOTE — ED Notes (Signed)
Pt on monitor 

## 2022-10-31 ENCOUNTER — Inpatient Hospital Stay (HOSPITAL_COMMUNITY): Payer: Medicare Other

## 2022-10-31 DIAGNOSIS — I25118 Atherosclerotic heart disease of native coronary artery with other forms of angina pectoris: Secondary | ICD-10-CM | POA: Diagnosis not present

## 2022-10-31 DIAGNOSIS — N1831 Chronic kidney disease, stage 3a: Secondary | ICD-10-CM | POA: Diagnosis not present

## 2022-10-31 DIAGNOSIS — I509 Heart failure, unspecified: Secondary | ICD-10-CM | POA: Diagnosis not present

## 2022-10-31 DIAGNOSIS — I5031 Acute diastolic (congestive) heart failure: Secondary | ICD-10-CM

## 2022-10-31 DIAGNOSIS — I5033 Acute on chronic diastolic (congestive) heart failure: Secondary | ICD-10-CM | POA: Diagnosis not present

## 2022-10-31 LAB — RESPIRATORY PANEL BY PCR

## 2022-10-31 LAB — CBC
HCT: 50.1 % (ref 39.0–52.0)
Hemoglobin: 15.9 g/dL (ref 13.0–17.0)
MCH: 30.5 pg (ref 26.0–34.0)
MCHC: 31.7 g/dL (ref 30.0–36.0)
MCV: 96.2 fL (ref 80.0–100.0)
Platelets: 201 10*3/uL (ref 150–400)
RBC: 5.21 MIL/uL (ref 4.22–5.81)
RDW: 14 % (ref 11.5–15.5)
WBC: 4.6 10*3/uL (ref 4.0–10.5)
nRBC: 0 % (ref 0.0–0.2)

## 2022-10-31 LAB — BASIC METABOLIC PANEL
Anion gap: 13 (ref 5–15)
BUN: 27 mg/dL — ABNORMAL HIGH (ref 8–23)
CO2: 21 mmol/L — ABNORMAL LOW (ref 22–32)
Calcium: 8.5 mg/dL — ABNORMAL LOW (ref 8.9–10.3)
Chloride: 99 mmol/L (ref 98–111)
Creatinine, Ser: 1.46 mg/dL — ABNORMAL HIGH (ref 0.61–1.24)
GFR, Estimated: 45 mL/min — ABNORMAL LOW (ref >60)
Glucose, Bld: 239 mg/dL — ABNORMAL HIGH (ref 70–99)
Potassium: 3.5 mmol/L (ref 3.5–5.1)
Sodium: 133 mmol/L — ABNORMAL LOW (ref 135–145)

## 2022-10-31 LAB — PROCALCITONIN: Procalcitonin: 0.1 ng/mL

## 2022-10-31 LAB — ECHOCARDIOGRAM COMPLETE
AR max vel: 3.34 cm2
AV Area VTI: 2.84 cm2
AV Area mean vel: 3.15 cm2
AV Mean grad: 2 mmHg
AV Peak grad: 3 mmHg
Ao pk vel: 0.87 m/s
Area-P 1/2: 3.91 cm2
Height: 72 in
S' Lateral: 3.7 cm
Weight: 3315.72 [oz_av]

## 2022-10-31 LAB — MAGNESIUM: Magnesium: 2.1 mg/dL (ref 1.7–2.4)

## 2022-10-31 MED ORDER — DOCUSATE SODIUM 100 MG PO CAPS
200.0000 mg | ORAL_CAPSULE | Freq: Every day | ORAL | Status: DC
  Administered 2022-10-31 – 2022-11-03 (×4): 200 mg via ORAL
  Filled 2022-10-31 (×4): qty 2

## 2022-10-31 MED ORDER — GABAPENTIN 300 MG PO CAPS
600.0000 mg | ORAL_CAPSULE | ORAL | Status: DC
  Administered 2022-10-31 – 2022-11-03 (×7): 600 mg via ORAL
  Filled 2022-10-31 (×7): qty 2

## 2022-10-31 MED ORDER — PERFLUTREN LIPID MICROSPHERE
1.0000 mL | INTRAVENOUS | Status: AC | PRN
  Administered 2022-10-31: 4 mL via INTRAVENOUS

## 2022-10-31 MED ORDER — GABAPENTIN 300 MG PO CAPS
300.0000 mg | ORAL_CAPSULE | ORAL | Status: DC
  Administered 2022-10-31 – 2022-11-03 (×7): 300 mg via ORAL
  Filled 2022-10-31 (×7): qty 1

## 2022-10-31 MED ORDER — ALBUTEROL SULFATE (2.5 MG/3ML) 0.083% IN NEBU
2.5000 mg | INHALATION_SOLUTION | Freq: Three times a day (TID) | RESPIRATORY_TRACT | Status: DC
  Filled 2022-10-31 (×2): qty 3

## 2022-10-31 MED ORDER — ALBUTEROL SULFATE (2.5 MG/3ML) 0.083% IN NEBU
2.5000 mg | INHALATION_SOLUTION | Freq: Four times a day (QID) | RESPIRATORY_TRACT | Status: DC
  Administered 2022-10-31: 2.5 mg via RESPIRATORY_TRACT
  Filled 2022-10-31: qty 3

## 2022-10-31 MED ORDER — QUETIAPINE FUMARATE 25 MG PO TABS
25.0000 mg | ORAL_TABLET | Freq: Every evening | ORAL | Status: DC | PRN
  Filled 2022-10-31: qty 1

## 2022-10-31 MED ORDER — ORAL CARE MOUTH RINSE: 15.0000 mL | OROMUCOSAL | Status: DC | PRN

## 2022-10-31 MED ORDER — METHYLPREDNISOLONE SODIUM SUCC 40 MG IJ SOLR
40.0000 mg | Freq: Two times a day (BID) | INTRAMUSCULAR | Status: DC
  Administered 2022-10-31: 40 mg via INTRAVENOUS
  Filled 2022-10-31: qty 1

## 2022-10-31 MED ORDER — PREDNISONE 20 MG PO TABS
20.0000 mg | ORAL_TABLET | Freq: Every day | ORAL | Status: AC
  Administered 2022-11-01: 20 mg via ORAL
  Filled 2022-10-31: qty 1

## 2022-10-31 MED ORDER — METHYLPREDNISOLONE SODIUM SUCC 40 MG IJ SOLR
40.0000 mg | Freq: Two times a day (BID) | INTRAMUSCULAR | Status: AC
  Administered 2022-10-31 – 2022-11-01 (×2): 40 mg via INTRAVENOUS
  Filled 2022-10-31 (×2): qty 1

## 2022-10-31 MED ORDER — SODIUM CHLORIDE 0.9 % IV SOLN
100.0000 mg | Freq: Two times a day (BID) | INTRAVENOUS | Status: DC
  Administered 2022-10-31 – 2022-11-01 (×3): 100 mg via INTRAVENOUS
  Filled 2022-10-31 (×3): qty 100

## 2022-10-31 MED ORDER — SODIUM CHLORIDE 0.9 % IV SOLN
1.0000 g | INTRAVENOUS | Status: DC
  Administered 2022-10-31 – 2022-11-01 (×2): 1 g via INTRAVENOUS
  Filled 2022-10-31 (×2): qty 10

## 2022-10-31 MED ORDER — DOCUSATE SODIUM 50 MG PO CAPS: 250.0000 mg | ORAL_CAPSULE | Freq: Every day | ORAL | Status: DC

## 2022-10-31 MED ORDER — MELATONIN 5 MG PO TABS
10.0000 mg | ORAL_TABLET | Freq: Every day | ORAL | Status: DC
  Administered 2022-10-31 – 2022-11-02 (×3): 10 mg via ORAL
  Filled 2022-10-31 (×3): qty 2

## 2022-10-31 MED ORDER — POTASSIUM CHLORIDE CRYS ER 20 MEQ PO TBCR
40.0000 meq | EXTENDED_RELEASE_TABLET | Freq: Two times a day (BID) | ORAL | Status: AC
  Administered 2022-10-31 (×2): 40 meq via ORAL
  Filled 2022-10-31 (×2): qty 2

## 2022-10-31 NOTE — Progress Notes (Signed)
*  PRELIMINARY RESULTS* Echocardiogram 2D Echocardiogram has been performed.  Marc Gilbert 10/31/2022, 10:33 AM

## 2022-10-31 NOTE — Plan of Care (Signed)

## 2022-10-31 NOTE — Plan of Care (Signed)
Problem: Safety: Goal: Ability to remain free from injury will improve Outcome: Progressing   Problem: Coping: Goal: Level of anxiety will decrease Outcome: Progressing   Problem: Activity: Goal: Risk for activity intolerance will decrease Outcome: Progressing   Problem: Clinical Measurements: Goal: Ability to maintain clinical measurements within normal limits will improve Outcome: Progressing

## 2022-10-31 NOTE — Progress Notes (Signed)
Mobility Specialist - Progress Note   10/31/22 1148  Mobility  Activity Transferred from bed to chair  Level of Assistance Contact guard assist, steadying assist  Assistive Device Front wheel walker  Distance Ambulated (ft) 2 ft  Range of Motion/Exercises Active  Activity Response Tolerated well  Mobility Referral Yes  $Mobility charge 1 Mobility  Mobility Specialist Start Time (ACUTE ONLY) 1140  Mobility Specialist Stop Time (ACUTE ONLY) 1148  Mobility Specialist Time Calculation (min) (ACUTE ONLY) 8 min   Pt was found in bed wanting to sit on recliner chair. Pt able to stand and pivot to recliner chair. Was left on recliner chair with all needs met. Call bell in reach and chair alarm on. Wife and daughter in room.  Billey Chang Mobility Specialist

## 2022-10-31 NOTE — Progress Notes (Signed)
Daughter, Maryruth Bun requests that the rounding provider call daughter, Larita Fife with updates and plan of care for patient.

## 2022-10-31 NOTE — ED Notes (Signed)
W

## 2022-10-31 NOTE — ED Notes (Signed)
ED TO INPATIENT HANDOFF REPORT  Name/Age/Gender Marc Gilbert 89 y.o. male  Code Status    Code Status Orders  (From admission, onward)           Start     Ordered   10/30/22 2252  Full code  Continuous       Question:  By:  Answer:  Consent: discussion documented in EHR   10/30/22 2252           Code Status History     Date Active Date Inactive Code Status Order ID Comments User Context   08/10/2022 1242 08/10/2022 2228 Full Code [id number]  Mealor, [name], MD Inpatient   12/25/2021 1357 12/27/2021 1714 Full Code [id number]  Kathryne Hitch, MD Inpatient   06/18/2020 1301 06/19/2020 1744 Full Code [id number]  Armida Sans, PA-C Inpatient   08/14/2016 1613 08/19/2016 1604 Full Code [id number]  Sharlene Dory, PA-C Inpatient   08/10/2016 1435 08/14/2016 1613 Full Code [id number]  Marcelino Duster, PA Inpatient   11/29/2015 1334 11/30/2015 1322 Full Code [id number]  Gwenyth Bender, NP ED   08/30/2015 1728 08/31/2015 1953 Full Code [id number]  Jonah Blue, MD Inpatient   05/26/2011 1209 05/29/2011 1902 Full Code [id number]  Thomasena Edis, Farrel Gobble., RN Inpatient       Home/SNF/Other Home  Chief Complaint Acute on chronic heart failure with preserved ejection fraction (HFpEF) (HCC) [I50.33]  Level of Care/Admitting Diagnosis ED Disposition     ED Disposition  Admit   Condition  --   Comment  Hospital Area: [facility] [[id number]]  Level of Care: Telemetry [5]  Admit to tele based on following criteria: Acute CHF  May admit patient to [facility] or [facility] if equivalent level of care is available:: No  Covid Evaluation: Confirmed COVID Negative  Diagnosis: Acute on chronic heart failure with preserved ejection fraction (HFpEF) [facility]) [[id number]]  Admitting Physician: Charlsie Quest [[id number]]  Attending Physician: Charlsie Quest [[id number]]  Certification:: I certify this patient will need inpatient services for at least 2 midnights  Expected  Medical Readiness: 11/02/2022          Medical History Past Medical History:  Diagnosis Date   Arthritis    "right hand; back" (08/30/2015)   Cancer (HCC)    skin  squamous and basal   Chronic lower back pain    CKD (chronic kidney disease), stage III (HCC)    Stage III   COPD (chronic obstructive pulmonary disease) (HCC)    no home O2   Coronary artery disease    Dysplastic colon polyp age 3   carcinoma in situ   GERD (gastroesophageal reflux disease)    occ   H/O hiatal hernia    Hyperlipidemia    Hypertension    Prostate cancer (HCC) 07/2014   active surveillance, Glisson 6    Allergies No Known Allergies  IV Location/Drains/Wounds Patient Lines/Drains/Airways Status     Active Line/Drains/Airways     Name Placement date Placement time Site Days   Peripheral IV 10/30/22 20 G 1" Left Antecubital 10/30/22  2051  Antecubital  1            Labs/Imaging Results for orders placed or performed during the hospital encounter of 10/30/22 (from the past 48 hour(s))  Blood gas, venous (at [facility] and [facility])     Status: Abnormal   Collection Time: 10/30/22  8:51 PM  Result Value Ref Range   pH, Ven 7.38 7.25 -  7.43   pCO2, Ven 47 44 - 60 mmHg   pO2, Ven <31 (LL) 32 - 45 mmHg    Comment: CRITICAL RESULT CALLED TO, READ BACK BY AND VERIFIED WITH: Nivek Powley B.@  2121 10/30/22 MCLEAN K.    Bicarbonate 27.8 20.0 - 28.0 mmol/L   Acid-Base Excess 2.0 0.0 - 2.0 mmol/L   O2 Saturation 32 %   Patient temperature 37.0     Comment: Performed at [facility], [address], [place], [place] [address]   DG Chest Port 1 View  Result Date: 10/30/2022 CLINICAL DATA:  Shortness of breath EXAM: PORTABLE CHEST 1 VIEW COMPARISON:  Film from earlier in the same day. FINDINGS: Cardiac shadow is enlarged somewhat accentuated by the portable technique. Postsurgical changes are noted. Vascular congestion is seen with slight increased interstitial markings consistent with  worsening edema. Mild left basilar atelectasis is noted. No effusion is seen. No bony abnormality is noted. IMPRESSION: Worsening pulmonary edema. Electronically Signed   By: Alcide Clever M.D.   On: 10/30/2022 21:29   DG Chest 2 View  Result Date: 10/30/2022 CLINICAL DATA:  Cough, shortness of breath EXAM: CHEST - 2 VIEW COMPARISON:  06/14/2017 FINDINGS: Status post median sternotomy. Cardiac size at the upper limit of normal, slightly increased from the prior exam. Diffuse increased interstitial opacities. No pleural effusion or pneumothorax. No acute osseous abnormality. Status post right shoulder arthroplasty. IMPRESSION: Diffuse increased interstitial opacities, favored to represent pulmonary edema, although a superimposed infection cannot be excluded. Electronically Signed   By: Wiliam Ke M.D.   On: 10/30/2022 13:28    Pending Labs Unresulted Labs (From admission, onward)     Start     Ordered   10/31/22 0500  Magnesium  Tomorrow morning,   R        10/30/22 2252   10/31/22 0500  CBC  Tomorrow morning,   R        10/30/22 2252   10/31/22 0500  Basic metabolic panel  Tomorrow morning,   R        10/30/22 2252            Vitals/Pain Today's Vitals   10/30/22 1959 10/30/22 2128 10/30/22 2130 10/30/22 2354  BP: (!) 143/83 133/80 127/83   Pulse: 99 (!) 101 97   Resp: (!) 24 (!) 23 (!) 27   Temp: 98.3 F (36.8 C)   97.9 F (36.6 C)  TempSrc: Oral   Oral  SpO2: (!) 85% 91% 91%   Weight:      Height:      PainSc:        Isolation Precautions No active isolations  Medications Medications  furosemide (LASIX) injection 40 mg (has no administration in time range)  sodium chloride flush (NS) 0.9 % injection 3 mL (has no administration in time range)  acetaminophen (TYLENOL) tablet 650 mg (has no administration in time range)    Or  acetaminophen (TYLENOL) suppository 650 mg (has no administration in time range)  ondansetron (ZOFRAN) tablet 4 mg (has no administration in  time range)    Or  ondansetron (ZOFRAN) injection 4 mg (has no administration in time range)  senna-docusate (Senokot-S) tablet 1 tablet (has no administration in time range)  albuterol (PROVENTIL) (2.5 MG/3ML) 0.083% nebulizer solution 2.5 mg (has no administration in time range)  apixaban (ELIQUIS) tablet 5 mg (has no administration in time range)  atorvastatin (LIPITOR) tablet 40 mg (has no administration in time range)  oxybutynin (DITROPAN)  tablet 5 mg (has no administration in time range)  tamsulosin (FLOMAX) capsule 0.4 mg (has no administration in time range)  levothyroxine (SYNTHROID) tablet 50 mcg (has no administration in time range)  furosemide (LASIX) injection 40 mg (40 mg Intravenous Given 10/30/22 2054)  albuterol (PROVENTIL) (2.5 MG/3ML) 0.083% nebulizer solution 15 mg (12.5 mg Nebulization Given 10/30/22 2053)  ipratropium (ATROVENT) nebulizer solution 1 mg (1 mg Nebulization Given 10/30/22 2054)  methylPREDNISolone sodium succinate (SOLU-MEDROL) 125 mg/2 mL injection 125 mg (125 mg Intravenous Given 10/30/22 2053)  magnesium sulfate IVPB 2 g 50 mL (2 g Intravenous New Bag/Given 10/30/22 2055)  albuterol (PROVENTIL) (2.5 MG/3ML) 0.083% nebulizer solution (  Given 10/30/22 2112)    Mobility walks with device

## 2022-10-31 NOTE — Progress Notes (Addendum)
[facility] PROGRESS NOTE    Progress Note  Marc Gilbert  AVW:[id number] DOB: 1931/01/17 DOA: 10/30/2022 PCP: Collene Mares, PA     Brief Narrative:   ABDULSALAM Gilbert is an 89 y.o. male past medical history significant for chronic diastolic heart failure, paroxysmal atrial fibrillation, status post ablation in 08/10/2022 on Eliquis, history of coronary artery disease status post CABG, chronic kidney see stage IIIa, hypothyroidism, hyperlipidemia comes in with shortness of breath that started about 1 week prior to admission with a productive cough in the ED was found to be hypoxic in the 70s chest x-ray showed pulmonary edema, left AGAINST MEDICAL ADVICE and returned back to the ED.   Assessment/Plan:   Acute respiratory failure with hypoxia probably due to COPD exacerbation versus atypical pneumonia: Still requiring 3 L of oxygen to keep saturations greater than 88%.  Chest x-ray showed pulmonary bilateral infiltrates. Was started on IV Lasix, appears euvolemic on physical exam discontinue IV Lasix. Check a 2D echo pending. He has remained afebrile, no leukocytosis.  He did receive steroids in the ED will continue IV steroids with quick taper as he is not wheezing on physical exam. Out of bed to chair, continue strict I's and O's and daily weights. Consult physical therapy. Cardiac biomarkers negative x 2. Due to his age and not being able to sleep at night he is at high risk of aspiration and acute confusional state. Try to keep potassium greater than 4 magnesium greater than 2 use melatonin at night Haldol IV as needed for agitation. Continue IV steroids, will start empirically on oral doxycycline and Rocephin, check procalcitonin.  COPD (chronic obstructive pulmonary disease) (HCC) Rib orderly he was wheezing in the ED was started on IV Solu-Medrol nebulizer. No further wheezing. Continue inhalers, antibiotic and steroids.  Paroxysmal atrial fibrillation: In sinus  rhythm continue Eliquis, he status post ablation in 08/10/2022.  Chronic kidney disease, stage 3a (HCC) Creatinine appears to be at baseline continue to monitor.  Coronary artery disease of native heart with stable angina pectoris (HCC) Pain-free continue statin and Eliquis.  Hypothyroidism Continue Synthroid.  DVT prophylaxis: eliquis Family Communication:none Status is: Inpatient Remains inpatient appropriate because: Acute diastolic heart failure    Code Status:     Code Status Orders  (From admission, onward)           Start     Ordered   10/30/22 2252  Full code  Continuous       Question:  By:  Answer:  Consent: discussion documented in EHR   10/30/22 2252           Code Status History     Date Active Date Inactive Code Status Order ID Comments User Context   08/10/2022 1242 08/10/2022 2228 Full Code [id number]  Mealor, [name], MD Inpatient   12/25/2021 1357 12/27/2021 1714 Full Code [id number]  Kathryne Hitch, MD Inpatient   06/18/2020 1301 06/19/2020 1744 Full Code [id number]  Armida Sans, PA-C Inpatient   08/14/2016 1613 08/19/2016 1604 Full Code [id number]  Sharlene Dory, PA-C Inpatient   08/10/2016 1435 08/14/2016 1613 Full Code [id number]  Marcelino Duster, PA Inpatient   11/29/2015 1334 11/30/2015 1322 Full Code [id number]  Gwenyth Bender, NP ED   08/30/2015 1728 08/31/2015 1953 Full Code [id number]  Jonah Blue, MD Inpatient   05/26/2011 1209 05/29/2011 1902 Full Code [id number]  Thomasena Edis, Farrel Gobble., RN Inpatient      Advance Directive Documentation  Flowsheet Row Most Recent Value  Type of Advance Directive Living will, Healthcare Power of Attorney  Pre-existing out of facility DNR order (yellow form or pink MOST form) --  "MOST" Form in Place? --         IV Access:   Peripheral IV   Procedures and diagnostic studies:   DG Chest Port 1 View  Result Date: 10/30/2022 CLINICAL DATA:  Shortness of breath EXAM: PORTABLE CHEST 1  VIEW COMPARISON:  Film from earlier in the same day. FINDINGS: Cardiac shadow is enlarged somewhat accentuated by the portable technique. Postsurgical changes are noted. Vascular congestion is seen with slight increased interstitial markings consistent with worsening edema. Mild left basilar atelectasis is noted. No effusion is seen. No bony abnormality is noted. IMPRESSION: Worsening pulmonary edema. Electronically Signed   By: Alcide Clever M.D.   On: 10/30/2022 21:29   DG Chest 2 View  Result Date: 10/30/2022 CLINICAL DATA:  Cough, shortness of breath EXAM: CHEST - 2 VIEW COMPARISON:  06/14/2017 FINDINGS: Status post median sternotomy. Cardiac size at the upper limit of normal, slightly increased from the prior exam. Diffuse increased interstitial opacities. No pleural effusion or pneumothorax. No acute osseous abnormality. Status post right shoulder arthroplasty. IMPRESSION: Diffuse increased interstitial opacities, favored to represent pulmonary edema, although a superimposed infection cannot be excluded. Electronically Signed   By: Wiliam Ke M.D.   On: 10/30/2022 13:28     Medical Consultants:   None.   Subjective:    Marc Gilbert awake relates his breathing is unchanged compared to yesterday continues to have this nagging productive cough  Objective:    Vitals:   10/31/22 0142 10/31/22 0145 10/31/22 0515 10/31/22 0624  BP: (!) 111/58  (!) 107/57   Pulse:   94   Resp: 20  (!) 22   Temp: 98 F (36.7 C)  98.3 F (36.8 C)   TempSrc: Oral  Oral   SpO2: 90%  93%   Weight:  94.1 kg  94 kg  Height:       SpO2: 93 % O2 Flow Rate (L/min): 3 L/min   Intake/Output Summary (Last 24 hours) at 10/31/2022 0730 Last data filed at 10/31/2022 7829 Gross per 24 hour  Intake 223 ml  Output 300 ml  Net -77 ml   Filed Weights   10/30/22 1717 10/31/22 0145 10/31/22 0624  Weight: 93 kg 94.1 kg 94 kg    Exam: General exam: In no acute distress. Respiratory system: Good air movement  and clear to auscultation. Cardiovascular system: S1 & S2 heard, RRR.  Negative JVD Gastrointestinal system: Abdomen is nondistended, soft and nontender.  Extremities: Trace edema Skin: No rashes, lesions or ulcers Psychiatry: Judgement and insight appear normal. Mood & affect appropriate.    Data Reviewed:    Labs: Basic Metabolic Panel: Recent Labs  Lab 10/30/22 1037 10/31/22 0458  NA 134* 133*  K 4.0 3.5  CL 101 99  CO2 22 21*  GLUCOSE 138* 239*  BUN 22 27*  CREATININE 1.27* 1.46*  CALCIUM 8.8* 8.5*  MG  --  2.1   GFR Estimated Creatinine Clearance: 40 mL/min (A) (by C-G formula based on SCr of 1.46 mg/dL (H)). Liver Function Tests: Recent Labs  Lab 10/30/22 1037  AST 27  ALT 22  ALKPHOS 68  BILITOT 1.1  PROT 6.7  ALBUMIN 3.0*   No results for input(s): "LIPASE", "AMYLASE" in the last 168 hours. No results for input(s): "AMMONIA" in the last 168 hours. Coagulation  profile No results for input(s): "INR", "PROTIME" in the last 168 hours. COVID-19 Labs  No results for input(s): "DDIMER", "FERRITIN", "LDH", "CRP" in the last 72 hours.  Lab Results  Component Value Date   SARSCOV2NAA NEGATIVE 10/30/2022   SARSCOV2NAA NEGATIVE 06/14/2020    CBC: Recent Labs  Lab 10/30/22 1037 10/31/22 0458  WBC 7.3 4.6  NEUTROABS 5.0  --   HGB 16.0 15.9  HCT 48.2 50.1  MCV 92.3 96.2  PLT 199 201   Cardiac Enzymes: No results for input(s): "CKTOTAL", "CKMB", "CKMBINDEX", "TROPONINI" in the last 168 hours. BNP (last 3 results) No results for input(s): "PROBNP" in the last 8760 hours. CBG: No results for input(s): "GLUCAP" in the last 168 hours. D-Dimer: No results for input(s): "DDIMER" in the last 72 hours. Hgb A1c: No results for input(s): "HGBA1C" in the last 72 hours. Lipid Profile: No results for input(s): "CHOL", "HDL", "LDLCALC", "TRIG", "CHOLHDL", "LDLDIRECT" in the last 72 hours. Thyroid function studies: No results for input(s): "TSH", "T4TOTAL",  "T3FREE", "THYROIDAB" in the last 72 hours.  Invalid input(s): "FREET3" Anemia work up: No results for input(s): "VITAMINB12", "FOLATE", "FERRITIN", "TIBC", "IRON", "RETICCTPCT" in the last 72 hours. Sepsis Labs: Recent Labs  Lab 10/30/22 1037 10/31/22 0458  WBC 7.3 4.6   Microbiology Recent Results (from the past 240 hour(s))  Resp panel by RT-PCR (RSV, Flu A&B, Covid) Anterior Nasal Swab     Status: None   Collection Time: 10/30/22 10:37 AM   Specimen: Anterior Nasal Swab  Result Value Ref Range Status   SARS Coronavirus 2 by RT PCR NEGATIVE NEGATIVE Final    Comment: (NOTE) SARS-CoV-2 target nucleic acids are NOT DETECTED.  The SARS-CoV-2 RNA is generally detectable in upper respiratory specimens during the acute phase of infection. The lowest concentration of SARS-CoV-2 viral copies this assay can detect is 138 copies/mL. A negative result does not preclude SARS-Cov-2 infection and should not be used as the sole basis for treatment or other patient management decisions. A negative result may occur with  improper specimen collection/handling, submission of specimen other than nasopharyngeal swab, presence of viral mutation(s) within the areas targeted by this assay, and inadequate number of viral copies(<138 copies/mL). A negative result must be combined with clinical observations, patient history, and epidemiological information. The expected result is Negative.  Fact Sheet for Patients:  BloggerCourse.com  Fact Sheet for Healthcare Providers:  SeriousBroker.it  This test is no t yet approved or cleared by the [place] FDA and  has been authorized for detection and/or diagnosis of SARS-CoV-2 by FDA under an Emergency Use Authorization (EUA). This EUA will remain  in effect (meaning this test can be used) for the duration of the COVID-19 declaration under Section 564(b)(1) of the Act, 21 U.S.C.section  360bbb-3(b)(1), unless the authorization is terminated  or revoked sooner.       Influenza A by PCR NEGATIVE NEGATIVE Final   Influenza B by PCR NEGATIVE NEGATIVE Final    Comment: (NOTE) The Xpert Xpress SARS-CoV-2/FLU/RSV plus assay is intended as an aid in the diagnosis of influenza from Nasopharyngeal swab specimens and should not be used as a sole basis for treatment. Nasal washings and aspirates are unacceptable for Xpert Xpress SARS-CoV-2/FLU/RSV testing.  Fact Sheet for Patients: BloggerCourse.com  Fact Sheet for Healthcare Providers: SeriousBroker.it  This test is not yet approved or cleared by the [place] FDA and has been authorized for detection and/or diagnosis of SARS-CoV-2 by FDA under an Emergency Use Authorization (EUA). This  EUA will remain in effect (meaning this test can be used) for the duration of the COVID-19 declaration under Section 564(b)(1) of the Act, 21 U.S.C. section 360bbb-3(b)(1), unless the authorization is terminated or revoked.     Resp Syncytial Virus by PCR NEGATIVE NEGATIVE Final    Comment: (NOTE) Fact Sheet for Patients: BloggerCourse.com  Fact Sheet for Healthcare Providers: SeriousBroker.it  This test is not yet approved or cleared by the [place] FDA and has been authorized for detection and/or diagnosis of SARS-CoV-2 by FDA under an Emergency Use Authorization (EUA). This EUA will remain in effect (meaning this test can be used) for the duration of the COVID-19 declaration under Section 564(b)(1) of the Act, 21 U.S.C. section 360bbb-3(b)(1), unless the authorization is terminated or revoked.  Performed at [facility], [address] [place], [place] [address]      Medications:    apixaban  5 mg Oral BID   atorvastatin  40 mg Oral QHS   furosemide  40 mg Intravenous Q12H   gabapentin  300 mg Oral 2  times per day   And   gabapentin  600 mg Oral 2 times per day   levothyroxine  50 mcg Oral Q0600   oxybutynin  5 mg Oral QHS   sodium chloride flush  3 mL Intravenous Q12H   tamsulosin  0.4 mg Oral QHS   Continuous Infusions:    LOS: 1 day   Marinda Elk  [facility]  10/31/2022, 7:30 AM

## 2022-11-01 DIAGNOSIS — N1831 Chronic kidney disease, stage 3a: Secondary | ICD-10-CM | POA: Diagnosis not present

## 2022-11-01 DIAGNOSIS — J9601 Acute respiratory failure with hypoxia: Secondary | ICD-10-CM

## 2022-11-01 DIAGNOSIS — I5033 Acute on chronic diastolic (congestive) heart failure: Secondary | ICD-10-CM | POA: Diagnosis not present

## 2022-11-01 DIAGNOSIS — I509 Heart failure, unspecified: Secondary | ICD-10-CM | POA: Diagnosis not present

## 2022-11-01 DIAGNOSIS — I25118 Atherosclerotic heart disease of native coronary artery with other forms of angina pectoris: Secondary | ICD-10-CM | POA: Diagnosis not present

## 2022-11-01 LAB — BASIC METABOLIC PANEL
Anion gap: 9 (ref 5–15)
BUN: 45 mg/dL — ABNORMAL HIGH (ref 8–23)
CO2: 21 mmol/L — ABNORMAL LOW (ref 22–32)
Calcium: 8.4 mg/dL — ABNORMAL LOW (ref 8.9–10.3)
Chloride: 104 mmol/L (ref 98–111)
Creatinine, Ser: 1.34 mg/dL — ABNORMAL HIGH (ref 0.61–1.24)
GFR, Estimated: 50 mL/min — ABNORMAL LOW (ref >60)
Glucose, Bld: 175 mg/dL — ABNORMAL HIGH (ref 70–99)
Potassium: 5.1 mmol/L (ref 3.5–5.1)
Sodium: 134 mmol/L — ABNORMAL LOW (ref 135–145)

## 2022-11-01 MED ORDER — EMPAGLIFLOZIN 10 MG PO TABS
10.0000 mg | ORAL_TABLET | Freq: Every day | ORAL | Status: DC
  Administered 2022-11-01 – 2022-11-03 (×3): 10 mg via ORAL
  Filled 2022-11-01 (×3): qty 1

## 2022-11-01 MED ORDER — ALBUTEROL SULFATE (2.5 MG/3ML) 0.083% IN NEBU
2.5000 mg | INHALATION_SOLUTION | Freq: Two times a day (BID) | RESPIRATORY_TRACT | Status: DC
  Administered 2022-11-01 – 2022-11-02 (×4): 2.5 mg via RESPIRATORY_TRACT
  Filled 2022-11-01 (×3): qty 3

## 2022-11-01 MED ORDER — DOXYCYCLINE HYCLATE 100 MG PO TABS
100.0000 mg | ORAL_TABLET | Freq: Two times a day (BID) | ORAL | Status: DC
  Administered 2022-11-01: 100 mg via ORAL
  Filled 2022-11-01: qty 1

## 2022-11-01 MED ORDER — FUROSEMIDE 40 MG PO TABS: 40.0000 mg | ORAL_TABLET | Freq: Every day | ORAL | Status: DC

## 2022-11-01 NOTE — Evaluation (Signed)
Physical Therapy Evaluation Patient Details Name: Marc Gilbert MRN: [id number] DOB: 1930/03/13 Today's Date: 11/01/2022  History of Present Illness  89 y.o. male past medical history significant for chronic diastolic heart failure, paroxysmal atrial fibrillation, status post ablation in 08/10/2022 on Eliquis, history of coronary artery disease status post CABG, chronic kidney see stage IIIa, hypothyroidism, hyperlipidemia comes in with shortness of breath that started about 1 week prior to admission with a productive cough in the ED was found to be hypoxic in the 70s chest x-ray showed pulmonary edema, left AGAINST MEDICAL ADVICE and returned back to the ED.  Clinical Impression  Patient evaluated by Physical Therapy with no further acute PT needs identified. All education has been completed and the patient has no further questions.  Instructed in proper breathing techniques, use of flutter valve and IS;  reviewed ambulatory O2 sats and weaning with pt; pt is hopeful to wean from O2 over the next few days, I think this is quite probable  SpO2= 91% on RA at rest SpO2= 84% on RA while amb  (wave pattern acceptable, pt with decr perfusion) SpO2= 91% on 2L while amb;  SpO2= 94-97% at rest on RA after amb, use of flutter and hands warmed for improved perfusion  No further PT needs at this time   See below for any follow-up Physical Therapy or equipment needs. PT is signing off. Thank you for this referral.         If plan is discharge home, recommend the following:     Can travel by private vehicle        Equipment Recommendations None recommended by PT  Recommendations for Other Services       Functional Status Assessment Patient has not had a recent decline in their functional status     Precautions / Restrictions Precautions Precaution Comments: monitor O2 Restrictions Weight Bearing Restrictions: No      Mobility  Bed Mobility               General bed mobility  comments: in recliner and returned to same    Transfers Overall transfer level: Modified independent Equipment used: Rolling walker (2 wheels)                    Ambulation/Gait Ambulation/Gait assistance: Modified independent (Device/Increase time), Supervision Gait Distance (Feet): 230 Feet Assistive device: Rolling walker (2 wheels) Gait Pattern/deviations: Step-through pattern, Decreased stride length       General Gait Details: good stability with RW, no LOB, denies dyspnea; SpO2=84% on RA while amb, 88% on 2L; 94-97% at rest on RA  Stairs            Wheelchair Mobility     Tilt Bed    Modified Rankin (Stroke Patients Only)       Balance Overall balance assessment: Mild deficits observed, not formally tested                                           Pertinent Vitals/Pain Pain Assessment Pain Assessment: No/denies pain    Home Living Family/patient expects to be discharged to:: Private residence Living Arrangements: Spouse/significant other   Type of Home: Apartment Home Access: Level entry       Home Layout: One level   Additional Comments: Pt plans dc backto  [place] where he resides in IND living; he plans to do "  PT to get back in shape"    Prior Function Prior Level of Function : Independent/Modified Independent             Mobility Comments: cane, RW, or rollator as needed       Extremity/Trunk Assessment   Upper Extremity Assessment Upper Extremity Assessment: Overall WFL for tasks assessed    Lower Extremity Assessment Lower Extremity Assessment: Overall WFL for tasks assessed    Cervical / Trunk Assessment Cervical / Trunk Assessment: Normal  Communication   Communication Communication: Hearing impairment (HOH)  Cognition Arousal: Alert Behavior During Therapy: WFL for tasks assessed/performed Overall Cognitive Status: Within Functional Limits for tasks assessed                                           General Comments      Exercises     Assessment/Plan    PT Assessment Patient does not need any further PT services  PT Problem List         PT Treatment Interventions      PT Goals (Current goals can be found in the Care Plan section)  Acute Rehab PT Goals PT Goal Formulation: All assessment and education complete, DC therapy    Frequency       Co-evaluation               AM-PAC PT "6 Clicks" Mobility  Outcome Measure Help needed turning from your back to your side while in a flat bed without using bedrails?: None Help needed moving from lying on your back to sitting on the side of a flat bed without using bedrails?: None Help needed moving to and from a bed to a chair (including a wheelchair)?: None Help needed standing up from a chair using your arms (e.g., wheelchair or bedside chair)?: None Help needed to walk in hospital room?: None Help needed climbing 3-5 steps with a railing? : None 6 Click Score: 24    End of Session Equipment Utilized During Treatment: Gait belt Activity Tolerance: Patient tolerated treatment well Patient left: in chair;with call bell/phone within reach;with chair alarm set;with family/visitor present Nurse Communication: Mobility status PT Visit Diagnosis: Difficulty in walking, not elsewhere classified (R26.2)    Time: 4098-1191 PT Time Calculation (min) (ACUTE ONLY): 33 min   Charges:   PT Evaluation $PT Eval Low Complexity: 1 Low PT Treatments $Gait Training: 8-22 mins PT General Charges $$ ACUTE PT VISIT: 1 Visit         Breeze Berringer, PT  Acute Rehab Dept [facility]) [phone]  11/01/2022   [facility] 11/01/2022, 4:15 PM

## 2022-11-01 NOTE — Progress Notes (Signed)
[facility] PROGRESS NOTE    Progress Note  [name]  WUJ:[id number] DOB: [birth year] DOA: 10/30/2022 PCP: Collene Mares, PA     Brief Narrative:   Marc Gilbert is an 89 y.o. male past medical history significant for chronic diastolic heart failure, paroxysmal atrial fibrillation, status post ablation in 08/10/2022 on Eliquis, history of coronary artery disease status post CABG, chronic kidney see stage IIIa, hypothyroidism, hyperlipidemia comes in with shortness of breath that started about 1 week prior to admission with a productive cough in the ED was found to be hypoxic in the 70s chest x-ray showed pulmonary edema, left AGAINST MEDICAL ADVICE and returned back to the ED.   Assessment/Plan:   Acute respiratory failure with hypoxia most likely due to COPD exacerbation versus atypical pneumonia: Still requiring 3 L of oxygen.  Try to wean to room air, out of bed to chair continue encourage incentive spirometry. 2D echo showed an ejection fraction of 45 to 50%.  With mildly decreased systolic function grade 1 diastolic dysfunction, 2D echo done about 6 to 8 weeks ago showed an EF of 50%. He has remained afebrile with no leukocytosis, respiratory panel is negative procalcitonin is low yield. Continue to work with physical therapy, cardiac biomarkers have been negative x 2. Due to his age and not being able to sleep at night he is at high risk of aspiration and acute confusional state. Try to keep potassium greater than 4 magnesium greater than 2 use melatonin at night Haldol IV as needed for agitation. Continue steroids, taper down quickly. Continue antibiotics. Consult cardiology. PT OT eval is pending.  COPD (chronic obstructive pulmonary disease) (HCC) Was wheezing in the ED was started on IV Solu-Medrol nebulizer. No further wheezing. Continue inhalers, antibiotic and steroids.    Paroxysmal atrial fibrillation: In sinus rhythm continue Eliquis, he status post  ablation in 08/10/2022.  Chronic kidney disease, stage 3a (HCC) Creatinine appears to be at baseline continue to monitor.  1.3-1.5  Coronary artery disease of native heart with stable angina pectoris (HCC) Pain-free continue statin and Eliquis.  Hypothyroidism Continue Synthroid.  DVT prophylaxis: eliquis Family Communication:none Status is: Inpatient Remains inpatient appropriate because: Acute diastolic heart failure    Code Status:     Code Status Orders  (From admission, onward)           Start     Ordered   10/30/22 2252  Full code  Continuous       Question:  By:  Answer:  Consent: discussion documented in EHR   10/30/22 2252           Code Status History     Date Active Date Inactive Code Status Order ID Comments User Context   08/10/2022 1242 08/10/2022 2228 Full Code [id number]  Mealor, [name], MD Inpatient   12/25/2021 1357 12/27/2021 1714 Full Code [id number]  Kathryne Hitch, MD Inpatient   06/18/2020 1301 06/19/2020 1744 Full Code [id number]  Armida Sans, PA-C Inpatient   08/14/2016 1613 08/19/2016 1604 Full Code [id number]  Sharlene Dory, PA-C Inpatient   08/10/2016 1435 08/14/2016 1613 Full Code [id number]  Marcelino Duster, PA Inpatient   11/29/2015 1334 11/30/2015 1322 Full Code [id number]  Gwenyth Bender, NP ED   08/30/2015 1728 08/31/2015 1953 Full Code [id number]  Jonah Blue, MD Inpatient   05/26/2011 1209 05/29/2011 1902 Full Code [id number]  Thomasena Edis, Farrel Gobble., RN Inpatient      Advance Directive Documentation  Flowsheet Row Most Recent Value  Type of Advance Directive Living will, Healthcare Power of Attorney  Pre-existing out of facility DNR order (yellow form or pink MOST form) --  "MOST" Form in Place? --         IV Access:   Peripheral IV   Procedures and diagnostic studies:   ECHOCARDIOGRAM COMPLETE  Result Date: 10/31/2022    ECHOCARDIOGRAM REPORT   Patient Name:   [name] Date of Exam: 10/31/2022  Medical Rec #:  [id number]      Height:       72.0 in Accession #:    [id number]     Weight:       207.2 lb Date of Birth:  1931/01/20      BSA:          2.163 m Patient Age:    89 years       BP:           107/57 mmHg Patient Gender: M              HR:           83 bpm. Exam Location:  Inpatient Procedure: 2D Echo, Cardiac Doppler, Color Doppler and Intracardiac            Opacification Agent Indications:    CHF I50.31  History:        Patient has prior history of Echocardiogram examinations, most                 recent 07/23/2022. CAD, Prior CABG, CKD; H/O DVT; Risk                 Factors:Hypertension, Dyslipidemia and Former Smoker.  Sonographer:    Dondra Prader RVT RCS Referring Phys: [id number] VISHAL R PATEL  Sonographer Comments: Technically difficult study due to poor echo windows, suboptimal apical window and suboptimal subcostal window. IMPRESSIONS  1. No left ventricular thrombus is seen (Definity contrast was used). Left ventricular ejection fraction, by estimation, is 45 to 50%. The left ventricle has mildly decreased function. The left ventricle demonstrates global hypokinesis. Left ventricular  diastolic parameters are consistent with Grade I diastolic dysfunction (impaired relaxation).  2. Right ventricular systolic function is mildly reduced. The right ventricular size is moderately enlarged. There is mildly elevated pulmonary artery systolic pressure. The estimated right ventricular systolic pressure is 38.0 mmHg.  3. Left atrial size was mild to moderately dilated.  4. Right atrial size was mildly dilated.  5. The mitral valve is normal in structure. No evidence of mitral valve regurgitation. No evidence of mitral stenosis.  6. The aortic valve is tricuspid. Aortic valve regurgitation is not visualized. No aortic stenosis is present.  7. The inferior vena cava is normal in size with greater than 50% respiratory variability, suggesting right atrial pressure of 3 mmHg. Comparison(s): No significant change  from prior study. Prior images reviewed side by side. FINDINGS  Left Ventricle: No left ventricular thrombus is seen (Definity contrast was used). Left ventricular ejection fraction, by estimation, is 45 to 50%. The left ventricle has mildly decreased function. The left ventricle demonstrates global hypokinesis. The  left ventricular internal cavity size was normal in size. There is no left ventricular hypertrophy. Abnormal (paradoxical) septal motion consistent with post-operative status. Left ventricular diastolic parameters are consistent with Grade I diastolic dysfunction (impaired relaxation). Normal left ventricular filling pressure. Right Ventricle: The right ventricular size is moderately enlarged. No increase in right ventricular wall thickness. Right ventricular  Flowsheet Row Most Recent Value  Type of Advance Directive Living will, Healthcare Power of Attorney  Pre-existing out of facility DNR order (yellow form or pink MOST form) --  "MOST" Form in Place? --         IV Access:   Peripheral IV   Procedures and diagnostic studies:   ECHOCARDIOGRAM COMPLETE  Result Date: 10/31/2022    ECHOCARDIOGRAM REPORT   Patient Name:   [name] Date of Exam: 10/31/2022  Medical Rec #:  [id number]      Height:       72.0 in Accession #:    [id number]     Weight:       207.2 lb Date of Birth:  1931/01/20      BSA:          2.163 m Patient Age:    89 years       BP:           107/57 mmHg Patient Gender: M              HR:           83 bpm. Exam Location:  Inpatient Procedure: 2D Echo, Cardiac Doppler, Color Doppler and Intracardiac            Opacification Agent Indications:    CHF I50.31  History:        Patient has prior history of Echocardiogram examinations, most                 recent 07/23/2022. CAD, Prior CABG, CKD; H/O DVT; Risk                 Factors:Hypertension, Dyslipidemia and Former Smoker.  Sonographer:    Dondra Prader RVT RCS Referring Phys: [id number] VISHAL R PATEL  Sonographer Comments: Technically difficult study due to poor echo windows, suboptimal apical window and suboptimal subcostal window. IMPRESSIONS  1. No left ventricular thrombus is seen (Definity contrast was used). Left ventricular ejection fraction, by estimation, is 45 to 50%. The left ventricle has mildly decreased function. The left ventricle demonstrates global hypokinesis. Left ventricular  diastolic parameters are consistent with Grade I diastolic dysfunction (impaired relaxation).  2. Right ventricular systolic function is mildly reduced. The right ventricular size is moderately enlarged. There is mildly elevated pulmonary artery systolic pressure. The estimated right ventricular systolic pressure is 38.0 mmHg.  3. Left atrial size was mild to moderately dilated.  4. Right atrial size was mildly dilated.  5. The mitral valve is normal in structure. No evidence of mitral valve regurgitation. No evidence of mitral stenosis.  6. The aortic valve is tricuspid. Aortic valve regurgitation is not visualized. No aortic stenosis is present.  7. The inferior vena cava is normal in size with greater than 50% respiratory variability, suggesting right atrial pressure of 3 mmHg. Comparison(s): No significant change  from prior study. Prior images reviewed side by side. FINDINGS  Left Ventricle: No left ventricular thrombus is seen (Definity contrast was used). Left ventricular ejection fraction, by estimation, is 45 to 50%. The left ventricle has mildly decreased function. The left ventricle demonstrates global hypokinesis. The  left ventricular internal cavity size was normal in size. There is no left ventricular hypertrophy. Abnormal (paradoxical) septal motion consistent with post-operative status. Left ventricular diastolic parameters are consistent with Grade I diastolic dysfunction (impaired relaxation). Normal left ventricular filling pressure. Right Ventricle: The right ventricular size is moderately enlarged. No increase in right ventricular wall thickness. Right ventricular  [facility] PROGRESS NOTE    Progress Note  [name]  WUJ:[id number] DOB: [birth year] DOA: 10/30/2022 PCP: Collene Mares, PA     Brief Narrative:   Marc Gilbert is an 89 y.o. male past medical history significant for chronic diastolic heart failure, paroxysmal atrial fibrillation, status post ablation in 08/10/2022 on Eliquis, history of coronary artery disease status post CABG, chronic kidney see stage IIIa, hypothyroidism, hyperlipidemia comes in with shortness of breath that started about 1 week prior to admission with a productive cough in the ED was found to be hypoxic in the 70s chest x-ray showed pulmonary edema, left AGAINST MEDICAL ADVICE and returned back to the ED.   Assessment/Plan:   Acute respiratory failure with hypoxia most likely due to COPD exacerbation versus atypical pneumonia: Still requiring 3 L of oxygen.  Try to wean to room air, out of bed to chair continue encourage incentive spirometry. 2D echo showed an ejection fraction of 45 to 50%.  With mildly decreased systolic function grade 1 diastolic dysfunction, 2D echo done about 6 to 8 weeks ago showed an EF of 50%. He has remained afebrile with no leukocytosis, respiratory panel is negative procalcitonin is low yield. Continue to work with physical therapy, cardiac biomarkers have been negative x 2. Due to his age and not being able to sleep at night he is at high risk of aspiration and acute confusional state. Try to keep potassium greater than 4 magnesium greater than 2 use melatonin at night Haldol IV as needed for agitation. Continue steroids, taper down quickly. Continue antibiotics. Consult cardiology. PT OT eval is pending.  COPD (chronic obstructive pulmonary disease) (HCC) Was wheezing in the ED was started on IV Solu-Medrol nebulizer. No further wheezing. Continue inhalers, antibiotic and steroids.    Paroxysmal atrial fibrillation: In sinus rhythm continue Eliquis, he status post  ablation in 08/10/2022.  Chronic kidney disease, stage 3a (HCC) Creatinine appears to be at baseline continue to monitor.  1.3-1.5  Coronary artery disease of native heart with stable angina pectoris (HCC) Pain-free continue statin and Eliquis.  Hypothyroidism Continue Synthroid.  DVT prophylaxis: eliquis Family Communication:none Status is: Inpatient Remains inpatient appropriate because: Acute diastolic heart failure    Code Status:     Code Status Orders  (From admission, onward)           Start     Ordered   10/30/22 2252  Full code  Continuous       Question:  By:  Answer:  Consent: discussion documented in EHR   10/30/22 2252           Code Status History     Date Active Date Inactive Code Status Order ID Comments User Context   08/10/2022 1242 08/10/2022 2228 Full Code [id number]  Mealor, [name], MD Inpatient   12/25/2021 1357 12/27/2021 1714 Full Code [id number]  Kathryne Hitch, MD Inpatient   06/18/2020 1301 06/19/2020 1744 Full Code [id number]  Armida Sans, PA-C Inpatient   08/14/2016 1613 08/19/2016 1604 Full Code [id number]  Sharlene Dory, PA-C Inpatient   08/10/2016 1435 08/14/2016 1613 Full Code [id number]  Marcelino Duster, PA Inpatient   11/29/2015 1334 11/30/2015 1322 Full Code [id number]  Gwenyth Bender, NP ED   08/30/2015 1728 08/31/2015 1953 Full Code [id number]  Jonah Blue, MD Inpatient   05/26/2011 1209 05/29/2011 1902 Full Code [id number]  Thomasena Edis, Farrel Gobble., RN Inpatient      Advance Directive Documentation  Flowsheet Row Most Recent Value  Type of Advance Directive Living will, Healthcare Power of Attorney  Pre-existing out of facility DNR order (yellow form or pink MOST form) --  "MOST" Form in Place? --         IV Access:   Peripheral IV   Procedures and diagnostic studies:   ECHOCARDIOGRAM COMPLETE  Result Date: 10/31/2022    ECHOCARDIOGRAM REPORT   Patient Name:   [name] Date of Exam: 10/31/2022  Medical Rec #:  [id number]      Height:       72.0 in Accession #:    [id number]     Weight:       207.2 lb Date of Birth:  1931/01/20      BSA:          2.163 m Patient Age:    89 years       BP:           107/57 mmHg Patient Gender: M              HR:           83 bpm. Exam Location:  Inpatient Procedure: 2D Echo, Cardiac Doppler, Color Doppler and Intracardiac            Opacification Agent Indications:    CHF I50.31  History:        Patient has prior history of Echocardiogram examinations, most                 recent 07/23/2022. CAD, Prior CABG, CKD; H/O DVT; Risk                 Factors:Hypertension, Dyslipidemia and Former Smoker.  Sonographer:    Dondra Prader RVT RCS Referring Phys: [id number] VISHAL R PATEL  Sonographer Comments: Technically difficult study due to poor echo windows, suboptimal apical window and suboptimal subcostal window. IMPRESSIONS  1. No left ventricular thrombus is seen (Definity contrast was used). Left ventricular ejection fraction, by estimation, is 45 to 50%. The left ventricle has mildly decreased function. The left ventricle demonstrates global hypokinesis. Left ventricular  diastolic parameters are consistent with Grade I diastolic dysfunction (impaired relaxation).  2. Right ventricular systolic function is mildly reduced. The right ventricular size is moderately enlarged. There is mildly elevated pulmonary artery systolic pressure. The estimated right ventricular systolic pressure is 38.0 mmHg.  3. Left atrial size was mild to moderately dilated.  4. Right atrial size was mildly dilated.  5. The mitral valve is normal in structure. No evidence of mitral valve regurgitation. No evidence of mitral stenosis.  6. The aortic valve is tricuspid. Aortic valve regurgitation is not visualized. No aortic stenosis is present.  7. The inferior vena cava is normal in size with greater than 50% respiratory variability, suggesting right atrial pressure of 3 mmHg. Comparison(s): No significant change  from prior study. Prior images reviewed side by side. FINDINGS  Left Ventricle: No left ventricular thrombus is seen (Definity contrast was used). Left ventricular ejection fraction, by estimation, is 45 to 50%. The left ventricle has mildly decreased function. The left ventricle demonstrates global hypokinesis. The  left ventricular internal cavity size was normal in size. There is no left ventricular hypertrophy. Abnormal (paradoxical) septal motion consistent with post-operative status. Left ventricular diastolic parameters are consistent with Grade I diastolic dysfunction (impaired relaxation). Normal left ventricular filling pressure. Right Ventricle: The right ventricular size is moderately enlarged. No increase in right ventricular wall thickness. Right ventricular  Flowsheet Row Most Recent Value  Type of Advance Directive Living will, Healthcare Power of Attorney  Pre-existing out of facility DNR order (yellow form or pink MOST form) --  "MOST" Form in Place? --         IV Access:   Peripheral IV   Procedures and diagnostic studies:   ECHOCARDIOGRAM COMPLETE  Result Date: 10/31/2022    ECHOCARDIOGRAM REPORT   Patient Name:   [name] Date of Exam: 10/31/2022  Medical Rec #:  [id number]      Height:       72.0 in Accession #:    [id number]     Weight:       207.2 lb Date of Birth:  1931/01/20      BSA:          2.163 m Patient Age:    89 years       BP:           107/57 mmHg Patient Gender: M              HR:           83 bpm. Exam Location:  Inpatient Procedure: 2D Echo, Cardiac Doppler, Color Doppler and Intracardiac            Opacification Agent Indications:    CHF I50.31  History:        Patient has prior history of Echocardiogram examinations, most                 recent 07/23/2022. CAD, Prior CABG, CKD; H/O DVT; Risk                 Factors:Hypertension, Dyslipidemia and Former Smoker.  Sonographer:    Dondra Prader RVT RCS Referring Phys: [id number] VISHAL R PATEL  Sonographer Comments: Technically difficult study due to poor echo windows, suboptimal apical window and suboptimal subcostal window. IMPRESSIONS  1. No left ventricular thrombus is seen (Definity contrast was used). Left ventricular ejection fraction, by estimation, is 45 to 50%. The left ventricle has mildly decreased function. The left ventricle demonstrates global hypokinesis. Left ventricular  diastolic parameters are consistent with Grade I diastolic dysfunction (impaired relaxation).  2. Right ventricular systolic function is mildly reduced. The right ventricular size is moderately enlarged. There is mildly elevated pulmonary artery systolic pressure. The estimated right ventricular systolic pressure is 38.0 mmHg.  3. Left atrial size was mild to moderately dilated.  4. Right atrial size was mildly dilated.  5. The mitral valve is normal in structure. No evidence of mitral valve regurgitation. No evidence of mitral stenosis.  6. The aortic valve is tricuspid. Aortic valve regurgitation is not visualized. No aortic stenosis is present.  7. The inferior vena cava is normal in size with greater than 50% respiratory variability, suggesting right atrial pressure of 3 mmHg. Comparison(s): No significant change  from prior study. Prior images reviewed side by side. FINDINGS  Left Ventricle: No left ventricular thrombus is seen (Definity contrast was used). Left ventricular ejection fraction, by estimation, is 45 to 50%. The left ventricle has mildly decreased function. The left ventricle demonstrates global hypokinesis. The  left ventricular internal cavity size was normal in size. There is no left ventricular hypertrophy. Abnormal (paradoxical) septal motion consistent with post-operative status. Left ventricular diastolic parameters are consistent with Grade I diastolic dysfunction (impaired relaxation). Normal left ventricular filling pressure. Right Ventricle: The right ventricular size is moderately enlarged. No increase in right ventricular wall thickness. Right ventricular  Flowsheet Row Most Recent Value  Type of Advance Directive Living will, Healthcare Power of Attorney  Pre-existing out of facility DNR order (yellow form or pink MOST form) --  "MOST" Form in Place? --         IV Access:   Peripheral IV   Procedures and diagnostic studies:   ECHOCARDIOGRAM COMPLETE  Result Date: 10/31/2022    ECHOCARDIOGRAM REPORT   Patient Name:   [name] Date of Exam: 10/31/2022  Medical Rec #:  [id number]      Height:       72.0 in Accession #:    [id number]     Weight:       207.2 lb Date of Birth:  1931/01/20      BSA:          2.163 m Patient Age:    89 years       BP:           107/57 mmHg Patient Gender: M              HR:           83 bpm. Exam Location:  Inpatient Procedure: 2D Echo, Cardiac Doppler, Color Doppler and Intracardiac            Opacification Agent Indications:    CHF I50.31  History:        Patient has prior history of Echocardiogram examinations, most                 recent 07/23/2022. CAD, Prior CABG, CKD; H/O DVT; Risk                 Factors:Hypertension, Dyslipidemia and Former Smoker.  Sonographer:    Dondra Prader RVT RCS Referring Phys: [id number] VISHAL R PATEL  Sonographer Comments: Technically difficult study due to poor echo windows, suboptimal apical window and suboptimal subcostal window. IMPRESSIONS  1. No left ventricular thrombus is seen (Definity contrast was used). Left ventricular ejection fraction, by estimation, is 45 to 50%. The left ventricle has mildly decreased function. The left ventricle demonstrates global hypokinesis. Left ventricular  diastolic parameters are consistent with Grade I diastolic dysfunction (impaired relaxation).  2. Right ventricular systolic function is mildly reduced. The right ventricular size is moderately enlarged. There is mildly elevated pulmonary artery systolic pressure. The estimated right ventricular systolic pressure is 38.0 mmHg.  3. Left atrial size was mild to moderately dilated.  4. Right atrial size was mildly dilated.  5. The mitral valve is normal in structure. No evidence of mitral valve regurgitation. No evidence of mitral stenosis.  6. The aortic valve is tricuspid. Aortic valve regurgitation is not visualized. No aortic stenosis is present.  7. The inferior vena cava is normal in size with greater than 50% respiratory variability, suggesting right atrial pressure of 3 mmHg. Comparison(s): No significant change  from prior study. Prior images reviewed side by side. FINDINGS  Left Ventricle: No left ventricular thrombus is seen (Definity contrast was used). Left ventricular ejection fraction, by estimation, is 45 to 50%. The left ventricle has mildly decreased function. The left ventricle demonstrates global hypokinesis. The  left ventricular internal cavity size was normal in size. There is no left ventricular hypertrophy. Abnormal (paradoxical) septal motion consistent with post-operative status. Left ventricular diastolic parameters are consistent with Grade I diastolic dysfunction (impaired relaxation). Normal left ventricular filling pressure. Right Ventricle: The right ventricular size is moderately enlarged. No increase in right ventricular wall thickness. Right ventricular

## 2022-11-01 NOTE — Progress Notes (Addendum)
   11/01/22 1414  TOC Brief Assessment  Insurance and Status Reviewed  Patient has primary care physician Yes  Home environment has been reviewed Pt from home with spouse  Prior level of function: Independent  Prior/Current Home Services No current home services  Social Determinants of Health Reivew SDOH reviewed no interventions necessary  Readmission risk has been reviewed Yes  Transition of care needs no transition of care needs at this time   Pt from home PCP directed to ER following labs. Transition of Care Department [facility]) has reviewed patient and no TOC needs have been identified at this time. We will continue to monitor patient advancement through interdisciplinary progression rounds. If new patient transition needs arise, please place a TOC consult.  Addendum: TOC Consult Heart Health Screen. CSW added information to AVS and visit to pt at bedside for assessment. Met with pt and daughter who was visiting. Pt does daily weights and records. Pt was in a positive mood and upbeat. TOC will watch for further DME needs.

## 2022-11-01 NOTE — Plan of Care (Signed)

## 2022-11-01 NOTE — Consult Note (Signed)
Cardiology Consult:   Patient ID: Marc Gilbert; MRN: [id number]; DOB: [birth year]   Admission date: 10/30/2022  Primary Care Provider: Collene Mares, PA Primary Cardiologist: Dr. Antoine Poche Primary Electrophysiologist:  Dr. Nelly Laurence  Consult question/Chief Complaint:  Change in LVEF  Patient Profile:   Marc Gilbert is a 89 y.o. male with a history of coronary artery disease (s/p CABG), atrial fibrillation s/p ablation and with prior pauses on AV nodal therapy, CKD stage IIIa, and COPD, presents with recent shortness of breath and a COPD exacerbation.  History of Present Illness:   Marc Gilbert is feeling much better.  Marc Gilbert, a 89 year old male, with prior smoking and known COPD but not on O2.  He has been diagnosed with HFpEF, and had prior AF with symptoms of worsening shortness of breath. The patient reports that his symptoms started around a week ago (family notes Wednesday, he notes 9/13), with progressive shortness of breath and a clear cough. He also reports feeling weak and almost dizzy after getting up from dinner. He denies experiencing any chest pain or palpitations.  Even in atrial fibrillation, symptoms are shortness of breath, he monitors this with an app on his phone and has it for review.  The patient has a history of smoking and has noticed a decrease in lung capacity over time, which he attributes to aging and his smoking history. He also reports a history of atrial fibrillation, which was treated with ablation and significantly improved his symptoms at the time. However, he notes that his condition has since declined, although he does not believe this is related to his heart.  The patient's COPD has been managed with breathing treatments and antibiotics, which he reports have improved his symptoms. He also reports that he has been coughing up phlegm, which he believes is due to a reduced lung capacity.  The patient lives in a retirement facility and has been  managing his condition with the help of his daughters, one of whom is an MD.. He reports that he has been using a rollator and cane to assist with mobility due to his symptoms.  Since he has been here, an elevated BNP was noted.   Sats have improved from 80 on admission.  K is 5.1.  Cr: 1.34 WBC: within normal limit Blood glucose: elevated Respiratory swab: negative   Allergies:   No Known Allergies  Social History:   Social History   Socioeconomic History   Marital status: Married    Spouse name: Not on file   Number of children: Not on file   Years of education: Not on file   Highest education level: Not on file  Occupational History   Occupation: retired 1996  Tobacco Use   Smoking status: Former    Current packs/day: 0.00    Average packs/day: 0.5 packs/day for 25.0 years (12.5 ttl pk-yrs)    Types: Cigarettes    Start date: 05/25/1963    Quit date: 05/24/1988    Years since quitting: 34.4   Smokeless tobacco: Never  Vaping Use   Vaping status: Never Used  Substance and Sexual Activity   Alcohol use: Yes    Alcohol/week: 10.0 standard drinks of alcohol    Types: 5 Glasses of wine, 5 Cans of beer per week    Comment: daily   Drug use: No   Sexual activity: Not Currently  Other Topics Concern   Not on file  Social History Narrative   Not on file  Social Determinants of Health   Financial Resource Strain: Not on file  Food Insecurity: No Food Insecurity (10/31/2022)   Hunger Vital Sign    Worried About Running Out of Food in the Last Year: Never true    Ran Out of Food in the Last Year: Never true  Transportation Needs: No Transportation Needs (10/31/2022)   PRAPARE - Administrator, Civil Service (Medical): No    Lack of Transportation (Non-Medical): No  Physical Activity: Not on file  Stress: Not on file  Social Connections: Not on file  Intimate Partner Violence: Not At Risk (10/31/2022)   Humiliation, Afraid, Rape, and Kick questionnaire    Fear  of Current or Ex-Partner: No    Emotionally Abused: No    Physically Abused: No    Sexually Abused: No    Social History - Patient is a former smoker.  Has two daugthers.  Family History:   The patient's family history includes Breast cancer in his daughter; Breast cancer (age of onset: 82) in his mother; Diabetes in his father. There is no history of Pancreatic cancer, Colon cancer, or Prostate cancer.    ROS:  Please see the history of present illness.   Physical Exam/Data:   Vitals:   10/31/22 2033 11/01/22 0500 11/01/22 0535 11/01/22 0924  BP: 93/62  112/65   Pulse: 91  64   Resp: 18  18   Temp: 97.6 F (36.4 C)  97.7 F (36.5 C)   TempSrc: Oral     SpO2: 95%  94% 91%  Weight:  96.2 kg    Height:        Intake/Output Summary (Last 24 hours) at 11/01/2022 0951 Last data filed at 11/01/2022 0717 Gross per 24 hour  Intake 613 ml  Output 1025 ml  Net -412 ml   Filed Weights   10/31/22 0145 10/31/22 0624 11/01/22 0500  Weight: 94.1 kg 94 kg 96.2 kg   Body mass index is 28.76 kg/m.   Gen: no distress, elderly male   Neck: No JVD Ears: bilateral Frank Sign Cardiac: No Rubs or Gallops, no murmur, regular rhythm with rare irregular beats Respiratory: Clear to auscultation bilaterally, normal effort, normal  respiratory rate GI: Soft, nontender, non-distended  MS: No  edema;  moves all extremities Integument: Skin feels warm Neuro:  At time of evaluation, alert and oriented to person/place/time/situation  Psych: Normal affect, patient feels well SKIN: Red mark on back well dressed.   EKG:  The ECG that was done  was personally reviewed and demonstrates SR with RBBB  Relevant CV Studies:  Echocardiogram: Significant RV dilation and dysfunction, pressure volume overload on short axis images, poorly defined endomyocardial borders in four chamber series, low normal left ventricular function (10/31/2022) Telemetry: Frequent premature ventricular contractions, no  VT  Cardiac Studies & Procedures   CARDIAC CATHETERIZATION  CARDIAC CATHETERIZATION 08/11/2016  Narrative  Critical obstruction in the proximal to mid LAD, 95%, within a heavily calcified region.  Angiographically significant calcified distal left main, 60%.  Greater than 90% small first obtuse marginal  30% mid circumflex  Widely patent, dominant, RCA.  Normal left ventricular systolic function with moderate mid to distal anterior wall hypokinesis. EF 45-50%. Mildly elevated LVEDP consistent with acute diastolic heart failure.  RECOMMENDATIONS:   Given angiographically significant left main, need to discuss treatment options, bypass surgery versus culprit lesion PCI on the LAD lesion (utilizing rotational or orbital atherectomy). Significant other comorbidities may impact treatment decision.  Surgical consultation  has been requested.  Findings Coronary Findings Diagnostic  Dominance: Co-dominant  Left Main  Left Anterior Descending The lesion is eccentric. The lesion is calcified.  First Diagonal Branch Vessel is small in size.  Left Circumflex  First Obtuse Marginal Branch Vessel is small in size.  Intervention  No interventions have been documented.   STRESS TESTS  NM MYOCAR MULTI W/SPECT W 08/31/2015  Narrative CLINICAL DATA:  Chest pain.  EXAM: MYOCARDIAL IMAGING WITH SPECT (REST AND PHARMACOLOGIC-STRESS)  GATED LEFT VENTRICULAR WALL MOTION STUDY  LEFT VENTRICULAR EJECTION FRACTION  TECHNIQUE: Standard myocardial SPECT imaging was performed after resting intravenous injection of 10 mCi Tc-48m tetrofosmin. Subsequently, intravenous infusion of Lexiscan was performed under the supervision of the Cardiology staff. At peak effect of the drug, 30 mCi Tc-12m tetrofosmin was injected intravenously and standard myocardial SPECT imaging was performed. Quantitative gated imaging was also performed to evaluate left ventricular wall motion, and estimate  left ventricular ejection fraction.  COMPARISON:  None.  FINDINGS: Perfusion: There is a medium to large size area of mild decreased activity defect involving the inferior apex and inferior wall. No reversibility identified.  Wall Motion: Decreased wall motion involving the mid and distal segments of the inferior wall. There is moderate hypokinesis involving the proximal mid and distal segments of the lateral wall.  Left Ventricular Ejection Fraction: 47 %  End diastolic volume 79 ml  End systolic volume 42 ml  IMPRESSION: 1. No pharmacologically induced. Myocardial reversibility identified.  2. Moderate lateral wall hypokinesis and mild inferior wall hypokinesis.  3. Left ventricular ejection fraction 47%  4. Non invasive risk stratification*: Low  *2012 Appropriate Use Criteria for Coronary Revascularization Focused Update: J Am Coll Cardiol. 2012;59(9):857-881. http://content.dementiazones.com.aspx?articleid=1201161   Electronically Signed By: Signa Kell M.D. On: 08/31/2015 11:46   ECHOCARDIOGRAM  ECHOCARDIOGRAM COMPLETE 10/31/2022  Narrative ECHOCARDIOGRAM REPORT    Patient Name:   JALONNIE Gilbert Date of Exam: 10/31/2022 Medical Rec #:  [id number]      Height:       72.0 in Accession #:    [id number]     Weight:       207.2 lb Date of Birth:  31-Jul-1930      BSA:          2.163 m Patient Age:    89 years       BP:           107/57 mmHg Patient Gender: M              HR:           83 bpm. Exam Location:  Inpatient  Procedure: 2D Echo, Cardiac Doppler, Color Doppler and Intracardiac Opacification Agent  Indications:    CHF I50.31  History:        Patient has prior history of Echocardiogram examinations, most recent 07/23/2022. CAD, Prior CABG, CKD; H/O DVT; Risk Factors:Hypertension, Dyslipidemia and Former Smoker.  Sonographer:    Dondra Prader RVT RCS Referring Phys: [id number] VISHAL R PATEL   Sonographer Comments: Technically difficult study  due to poor echo windows, suboptimal apical window and suboptimal subcostal window. IMPRESSIONS   1. No left ventricular thrombus is seen (Definity contrast was used). Left ventricular ejection fraction, by estimation, is 45 to 50%. The left ventricle has mildly decreased function. The left ventricle demonstrates global hypokinesis. Left ventricular diastolic parameters are consistent with Grade I diastolic dysfunction (impaired relaxation). 2. Right ventricular systolic function is mildly reduced. The right ventricular size is moderately  enlarged. There is mildly elevated pulmonary artery systolic pressure. The estimated right ventricular systolic pressure is 38.0 mmHg. 3. Left atrial size was mild to moderately dilated. 4. Right atrial size was mildly dilated. 5. The mitral valve is normal in structure. No evidence of mitral valve regurgitation. No evidence of mitral stenosis. 6. The aortic valve is tricuspid. Aortic valve regurgitation is not visualized. No aortic stenosis is present. 7. The inferior vena cava is normal in size with greater than 50% respiratory variability, suggesting right atrial pressure of 3 mmHg.  Comparison(s): No significant change from prior study. Prior images reviewed side by side.  FINDINGS Left Ventricle: No left ventricular thrombus is seen (Definity contrast was used). Left ventricular ejection fraction, by estimation, is 45 to 50%. The left ventricle has mildly decreased function. The left ventricle demonstrates global hypokinesis. The left ventricular internal cavity size was normal in size. There is no left ventricular hypertrophy. Abnormal (paradoxical) septal motion consistent with post-operative status. Left ventricular diastolic parameters are consistent with Grade I diastolic dysfunction (impaired relaxation). Normal left ventricular filling pressure.  Right Ventricle: The right ventricular size is moderately enlarged. No increase in right ventricular wall  thickness. Right ventricular systolic function is mildly reduced. There is mildly elevated pulmonary artery systolic pressure. The tricuspid regurgitant velocity is 2.96 m/s, and with an assumed right atrial pressure of 3 mmHg, the estimated right ventricular systolic pressure is 38.0 mmHg.  Left Atrium: Left atrial size was mild to moderately dilated.  Right Atrium: Right atrial size was mildly dilated.  Pericardium: There is no evidence of pericardial effusion.  Mitral Valve: The mitral valve is normal in structure. Mild mitral annular calcification. No evidence of mitral valve regurgitation. No evidence of mitral valve stenosis.  Tricuspid Valve: The tricuspid valve is normal in structure. Tricuspid valve regurgitation is mild.  Aortic Valve: The aortic valve is tricuspid. Aortic valve regurgitation is not visualized. No aortic stenosis is present. Aortic valve mean gradient measures 2.0 mmHg. Aortic valve peak gradient measures 3.0 mmHg. Aortic valve area, by VTI measures 2.84 cm.  Pulmonic Valve: The pulmonic valve was normal in structure. Pulmonic valve regurgitation is not visualized.  Aorta: The aortic root and ascending aorta are structurally normal, with no evidence of dilitation.  Venous: The inferior vena cava is normal in size with greater than 50% respiratory variability, suggesting right atrial pressure of 3 mmHg.  IAS/Shunts: No atrial level shunt detected by color flow Doppler.   LEFT VENTRICLE PLAX 2D LVIDd:         5.10 cm   Diastology LVIDs:         3.70 cm   LV e' medial:    6.31 cm/s LV PW:         1.20 cm   LV E/e' medial:  8.3 LV IVS:        1.00 cm   LV e' lateral:   7.83 cm/s LVOT diam:     2.20 cm   LV E/e' lateral: 6.7 LV SV:         45 LV SV Index:   21 LVOT Area:     3.80 cm   RIGHT VENTRICLE             IVC RV S prime:     10.70 cm/s  IVC diam: 1.90 cm TAPSE (M-mode): 1.9 cm  LEFT ATRIUM           Index        RIGHT ATRIUM  Index LA  diam:      4.30 cm 1.99 cm/m   RA Area:     18.65 cm LA Vol (A4C): 59.7 ml 27.60 ml/m  RA Volume:   51.40 ml  23.76 ml/m AORTIC VALVE                    PULMONIC VALVE AV Area (Vmax):    3.34 cm     PV Vmax:          0.63 m/s AV Area (Vmean):   3.15 cm     PV Peak grad:     1.6 mmHg AV Area (VTI):     2.84 cm     PR End Diast Vel: 7.84 msec AV Vmax:           86.90 cm/s AV Vmean:          57.400 cm/s AV VTI:            0.158 m AV Peak Grad:      3.0 mmHg AV Mean Grad:      2.0 mmHg LVOT Vmax:         76.30 cm/s LVOT Vmean:        47.600 cm/s LVOT VTI:          0.118 m LVOT/AV VTI ratio: 0.75  AORTA Ao Root diam: 3.60 cm Ao Asc diam:  3.60 cm  MITRAL VALVE               TRICUSPID VALVE MV Area (PHT): 3.91 cm    TR Peak grad:   35.0 mmHg MV Decel Time: 194 msec    TR Vmax:        296.00 cm/s MV E velocity: 52.30 cm/s MV A velocity: 81.00 cm/s  SHUNTS MV E/A ratio:  0.65        Systemic VTI:  0.12 m Systemic Diam: 2.20 cm  Thurmon Fair MD Electronically signed by Thurmon Fair MD Signature Date/Time: 10/31/2022/12:30:52 PM    Final   TEE  ECHO TEE 08/14/2016  Interpretation Summary  Septum: No Patent Foramen Ovale present.  Left atrium: Patent foramen ovale not present.  Aortic valve: No stenosis. Trace regurgitation.  Mitral valve: Mild regurgitation.  Left ventricle: LV systolic function is mildly reduced with an EF of 45-50%.   MONITORS  LONG TERM MONITOR (3-14 DAYS) 06/22/2022  Narrative Atrial flutter Predominantly slow ventricular rates Pauses with the longest being six seconds. Ventricular ectopy was noted.           Laboratory Data:  Chemistry Recent Labs  Lab 10/31/22 0458 11/01/22 0353  NA 133* 134*  K 3.5 5.1  CL 99 104  CO2 21* 21*  GLUCOSE 239* 175*  BUN 27* 45*  CREATININE 1.46* 1.34*  CALCIUM 8.5* 8.4*  GFRNONAA 45* 50*  ANIONGAP 13 9    Recent Labs  Lab 10/30/22 1037  PROT 6.7  ALBUMIN 3.0*  AST 27  ALT 22   ALKPHOS 68  BILITOT 1.1   Hematology Recent Labs  Lab 10/30/22 1037 10/31/22 0458  WBC 7.3 4.6  RBC 5.22 5.21  HGB 16.0 15.9  HCT 48.2 50.1  MCV 92.3 96.2  MCH 30.7 30.5  MCHC 33.2 31.7  RDW 13.9 14.0  PLT 199 201   Cardiac EnzymesNo results for input(s): "TROPONINI" in the last 168 hours. No results for input(s): "TROPIPOC" in the last 168 hours.  BNP Recent Labs  Lab 10/30/22 1037  BNP 468.8*  DDimer No results for input(s): "DDIMER" in the last 168 hours.   Assessment and Plan:    Heart Failure with Mildly Reduced Ejection Fraction (combined) Suspect Group III Pulmonary Hypertension secondary to COPD - NYHA I-II, Stage B, unclear etiology - Echocardiogram showed significant RV dilation and dysfunction, evidence of pressure/volume overload, and low normal LV function.  - complicated by pauses noted with amiodarone and beta blocker; will not restart AV nodal therapy due to pauses with amiodarone and beta blocker. -adding SGLT2 inhibitor to help with chronic kidney disease (IIIA) and heart failure. - ULN K and LVEF above 40%; will not add MRA at this time - if BP increased with PNA treatment, may add low dose ARB tomorrow  COPD Exacerbation - Presented with progressive shortness of breath and clear cough for one week. Improved with treatment for bacterial pneumonia and COPD exacerbation. -Continue current treatment plan.  Chronic Kidney Disease - Improved creatinine from 1.5 to 1.34. -Add SGLT2 inhibitor to help with chronic kidney disease and heart failure.  Atrial Fibrillation/Flutter - Presently in SR with RBBB  -with PVCs and Pauses; he is not on AV nodal agents as above - continue eliquis  Coronary Artery Disease with prior CABG HLD - asymptomatic - continue statin    For questions or updates, please contact [facility] Please consult [email or web address] for contact info under Cardiology/STEMI.   Riley Lam, MD FASE  [facility] Cardiologist [facility]  [address] [place], [address] [place], [place] [address] [phone]  9:51 AM

## 2022-11-02 DIAGNOSIS — J9601 Acute respiratory failure with hypoxia: Secondary | ICD-10-CM | POA: Diagnosis not present

## 2022-11-02 DIAGNOSIS — I480 Paroxysmal atrial fibrillation: Secondary | ICD-10-CM

## 2022-11-02 DIAGNOSIS — J441 Chronic obstructive pulmonary disease with (acute) exacerbation: Secondary | ICD-10-CM | POA: Diagnosis not present

## 2022-11-02 DIAGNOSIS — I509 Heart failure, unspecified: Secondary | ICD-10-CM | POA: Diagnosis not present

## 2022-11-02 LAB — BASIC METABOLIC PANEL
Anion gap: 9 (ref 5–15)
BUN: 43 mg/dL — ABNORMAL HIGH (ref 8–23)
CO2: 21 mmol/L — ABNORMAL LOW (ref 22–32)
Calcium: 8.5 mg/dL — ABNORMAL LOW (ref 8.9–10.3)
Chloride: 107 mmol/L (ref 98–111)
Creatinine, Ser: 1.42 mg/dL — ABNORMAL HIGH (ref 0.61–1.24)
GFR, Estimated: 47 mL/min — ABNORMAL LOW (ref >60)
Glucose, Bld: 122 mg/dL — ABNORMAL HIGH (ref 70–99)
Potassium: 4.6 mmol/L (ref 3.5–5.1)
Sodium: 137 mmol/L (ref 135–145)

## 2022-11-02 MED ORDER — CEFDINIR 300 MG PO CAPS
300.0000 mg | ORAL_CAPSULE | Freq: Two times a day (BID) | ORAL | Status: DC
  Administered 2022-11-02 – 2022-11-03 (×3): 300 mg via ORAL
  Filled 2022-11-02 (×3): qty 1

## 2022-11-02 MED ORDER — PREDNISONE 20 MG PO TABS
20.0000 mg | ORAL_TABLET | Freq: Every day | ORAL | Status: DC
  Administered 2022-11-02 – 2022-11-03 (×2): 20 mg via ORAL
  Filled 2022-11-02 (×2): qty 1

## 2022-11-02 MED ORDER — DOXYCYCLINE HYCLATE 100 MG PO TABS
100.0000 mg | ORAL_TABLET | Freq: Two times a day (BID) | ORAL | Status: DC
  Administered 2022-11-02 – 2022-11-03 (×3): 100 mg via ORAL
  Filled 2022-11-02 (×3): qty 1

## 2022-11-02 NOTE — Progress Notes (Signed)
SATURATION QUALIFICATIONS: (This note is used to comply with regulatory documentation for home oxygen)  Patient Saturations on Room Air at Rest = 97%  Patient Saturations on Room Air while Ambulating = 78%  Patient Saturations on 2 Liters of oxygen while Ambulating = 92%  Please briefly explain why patient needs home oxygen: desaturating with ambulation

## 2022-11-02 NOTE — TOC Progression Note (Addendum)
Transition of Care [facility]) - Progression Note    Patient Details  Name: Marc Gilbert MRN: [id number] Date of Birth: 05/19/1930  [facility] CM/SW Contact  Howell Rucks, RN Phone Number: 11/02/2022, 9:26 AM  Clinical Narrative: [facility] consulted for SNF placement and Heart Failure Navigation Team (consult already placed). PT eval completed, no recommendations. DC plan to return to [facility],. TOC will continue to follow.   -12:00pm Order for home 02. [employer] rep-Jermaine to delivery to bedside.     Barriers to Discharge: Continued Medical Work up  Expected Discharge Plan and Services                                               Social Determinants of Health (SDOH) Interventions SDOH Screenings   Food Insecurity: No Food Insecurity (10/31/2022)  Housing: Low Risk  (10/31/2022)  Transportation Needs: No Transportation Needs (10/31/2022)  Utilities: Not At Risk (10/31/2022)  Tobacco Use: Medium Risk (10/30/2022)    Readmission Risk Interventions     No data to display

## 2022-11-02 NOTE — Progress Notes (Signed)
Heart Failure Navigator Progress Note  Assessed for Heart & Vascular TOC clinic readiness.  Patient EF 45-50% has a scheduled [facility] follow up on 11/10/2022. .   Navigator will sign off at this time.   Rhae Hammock, BSN, Scientist, clinical (histocompatibility and immunogenetics) Only

## 2022-11-02 NOTE — Evaluation (Signed)
Occupational Therapy Evaluation Patient Details Name: Marc Gilbert MRN: [id number] DOB: August 02, 1930 Today's Date: 11/02/2022   History of Present Illness 89 y.o. male past medical history significant for chronic diastolic heart failure, paroxysmal atrial fibrillation, status post ablation in 08/10/2022 on Eliquis, history of coronary artery disease status post CABG, chronic kidney see stage IIIa, hypothyroidism, hyperlipidemia comes in with shortness of breath that started about 1 week prior to admission with a productive cough in the ED was found to be hypoxic in the 70s chest x-ray showed pulmonary edema, left AGAINST MEDICAL ADVICE and returned back to the ED.   Clinical Impression   Patient is a 89 year old male who was admitted for above. Patient was living at [facility]F independently with no AD prior level. Currently, patient is MI in room with RW with max A for O2 cord management. Patient was not receptive to education on O2 cord management with movement reporting he was planning to speak to MD about this in the AM.  Patient appears to be at baseline for ADL tasks aside from O2 use. Patient endorsed that he does not need OT.  OT to sign off at this time.       If plan is discharge home, recommend the following: Assistance with cooking/housework;Assist for transportation    Functional Status Assessment  Patient has not had a recent decline in their functional status  Equipment Recommendations  None recommended by OT       Precautions / Restrictions Precautions Precaution Comments: monitor O2 Restrictions Weight Bearing Restrictions: No      Mobility Bed Mobility               General bed mobility comments: in recliner and returned to same        Balance Overall balance assessment: Mild deficits observed, not formally tested                                         ADL either performed or assessed with clinical judgement   ADL Overall ADL's : At  baseline        General ADL Comments: patient was notably upset upon entrance to room with peronal O2 tank present. patient reported that he did not want to use O2, he would not go to exercise class with O2 and that he wanted to speak to the MD about this more. patient repeated these statemtens multiple times during session. patient was educated that he could still have value in activities and use O2 and that the more he can increase activity maybe there would be at time in the future he would not need it pending medical stability. patient referred to people in his exercise class as "poor unfortunate souls"" when speaking of people with o2 tanks. patient attempted to be redirected multiple times during session. patient was agreeable to functional mobility with patient able to walk to nursing stand and back to room in half loop with patients O2 at 93% on RA at half way mark and 84% upon return to room. patient was educated on sitting and deep breathing with patient continuing to speakl during recovery time increaseing length to two minutes while seated and encouraged to stop speaking. patient's daughter present in room at end of session and updated on participation in session. patient wasa ble to complete LB dressing tasks simulated seated in recliner. nurse updated on patietns c/o O2  tank as well.     Vision   Vision Assessment?: No apparent visual deficits            Pertinent Vitals/Pain Pain Assessment Pain Assessment: No/denies pain     Extremity/Trunk Assessment Upper Extremity Assessment Upper Extremity Assessment: Overall WFL for tasks assessed   Lower Extremity Assessment Lower Extremity Assessment: Overall WFL for tasks assessed   Cervical / Trunk Assessment Cervical / Trunk Assessment: Normal   Communication     Cognition Arousal: Alert Behavior During Therapy: WFL for tasks assessed/performed Overall Cognitive Status: Within Functional Limits for tasks assessed            General Comments  attempted to educate patient about O2 cord management with patient not receptive to training with reports of not wanting to use O2.            Home Living Family/patient expects to be discharged to:: Private residence Living Arrangements: Spouse/significant other Available Help at Discharge: Family;Available PRN/intermittently Type of Home: Apartment Home Access: Level entry     Home Layout: One level     Bathroom Shower/Tub: Producer, television/film/video: Handicapped height         Additional Comments: Pt plans dc backto  [place] where he resides in IND living; he plans to do "PT to get back in shape"      Prior Functioning/Environment Prior Level of Function : Independent/Modified Independent             Mobility Comments: cane, RW, or rollator as needed                   OT Goals(Current goals can be found in the care plan section) Acute Rehab OT Goals OT Goal Formulation: All assessment and education complete, DC therapy  OT Frequency:         AM-PAC OT "6 Clicks" Daily Activity     Outcome Measure Help from another person eating meals?: None Help from another person taking care of personal grooming?: None Help from another person toileting, which includes using toliet, bedpan, or urinal?: None Help from another person bathing (including washing, rinsing, drying)?: None Help from another person to put on and taking off regular upper body clothing?: None Help from another person to put on and taking off regular lower body clothing?: None 6 Click Score: 24   End of Session Nurse Communication: Other (comment) (patients comments about O2)  Activity Tolerance: Patient tolerated treatment well Patient left: in chair;with call bell/phone within reach;with chair alarm set  OT Visit Diagnosis: Unsteadiness on feet (R26.81)                Time: 1406-1430 OT Time Calculation (min): 24 min Charges:  OT General Charges $OT  Visit: 1 Visit OT Evaluation $OT Eval Low Complexity: 1 Low OT Treatments $Self Care/Home Management : 8-22 mins  Rosalio Loud, MS Acute Rehabilitation Department Office# [phone]   [name] 11/02/2022, 3:16 PM

## 2022-11-02 NOTE — Progress Notes (Signed)
[facility] PROGRESS NOTE    Progress Note  Marc Gilbert  ZOX:[id number] DOB: [birth year] DOA: 10/30/2022 PCP: Collene Mares, PA     Brief Narrative:   Marc Gilbert is an 89 y.o. male past medical history significant for chronic diastolic heart failure, paroxysmal atrial fibrillation, status post ablation in 08/10/2022 on Eliquis, history of coronary artery disease status post CABG, chronic kidney see stage IIIa, hypothyroidism, hyperlipidemia comes in with shortness of breath that started about 1 week prior to admission with a productive cough in the ED was found to be hypoxic in the 70s chest x-ray showed pulmonary edema, left AGAINST MEDICAL ADVICE and returned back to the ED.   Assessment/Plan:   Acute respiratory failure with hypoxia most likely due to COPD exacerbation versus atypical pneumonia: He has been weaned to room air satting greater 90%. Ambulate and check saturations with ambulation. Has remained afebrile with no leukocytosis. Change antibiotics to Omnicef and doxycycline, continue steroids. Due to his age and not being able to sleep at night he is at high risk of aspiration and acute confusional state. Consult cardiology cardiology was consulted. OT evaluation is pending. Physical therapy recommended no home health PT  Chronic heart failure with mildly reduced ejection fraction in the setting of suspect group 3 pulmonary hypertension secondary to COPD: SGLT2 inhibitor. Further management per cardiology.  COPD (chronic obstructive pulmonary disease) (HCC) Continue inhalers antibiotics and continue to taper down steroids. Ambulate and check saturations with ambulation.  Paroxysmal atrial fibrillation: In sinus rhythm continue Eliquis, he status post ablation in 08/10/2022. Will no AV nodal blocking and agents  Chronic kidney disease, stage 3a (HCC) Creatinine appears to be at baseline continue to monitor.  1.3-1.5  Coronary artery disease of native  heart with stable angina pectoris (HCC) Continue statin and Eliquis.  Hypothyroidism Continue Synthroid.  DVT prophylaxis: eliquis Family Communication:none Status is: Inpatient Remains inpatient appropriate because: Acute diastolic heart failure    Code Status:     Code Status Orders  (From admission, onward)           Start     Ordered   10/30/22 2252  Full code  Continuous       Question:  By:  Answer:  Consent: discussion documented in EHR   10/30/22 2252           Code Status History     Date Active Date Inactive Code Status Order ID Comments User Context   08/10/2022 1242 08/10/2022 2228 Full Code [id number]  Mealor, [name], MD Inpatient   12/25/2021 1357 12/27/2021 1714 Full Code [id number]  Kathryne Hitch, MD Inpatient   06/18/2020 1301 06/19/2020 1744 Full Code [id number]  Armida Sans, PA-C Inpatient   08/14/2016 1613 08/19/2016 1604 Full Code [id number]  Sharlene Dory, PA-C Inpatient   08/10/2016 1435 08/14/2016 1613 Full Code [id number]  Marcelino Duster, PA Inpatient   11/29/2015 1334 11/30/2015 1322 Full Code [id number]  Gwenyth Bender, NP ED   08/30/2015 1728 08/31/2015 1953 Full Code [id number]  Jonah Blue, MD Inpatient   05/26/2011 1209 05/29/2011 1902 Full Code [id number]  Thomasena Edis, Farrel Gobble., RN Inpatient      Advance Directive Documentation    Flowsheet Row Most Recent Value  Type of Advance Directive Living will, Healthcare Power of Attorney  Pre-existing out of facility DNR order (yellow form or pink MOST form) --  "MOST" Form in Place? --         IV Access:  Peripheral IV   Procedures and diagnostic studies:   ECHOCARDIOGRAM COMPLETE  Result Date: 10/31/2022    ECHOCARDIOGRAM REPORT   Patient Name:   Marc Gilbert Date of Exam: 10/31/2022 Medical Rec #:  [id number]      Height:       72.0 in Accession #:    [id number]     Weight:       207.2 lb Date of Birth:  28-Mar-1930      BSA:          2.163 m Patient Age:    89  years       BP:           107/57 mmHg Patient Gender: M              HR:           83 bpm. Exam Location:  Inpatient Procedure: 2D Echo, Cardiac Doppler, Color Doppler and Intracardiac            Opacification Agent Indications:    CHF I50.31  History:        Patient has prior history of Echocardiogram examinations, most                 recent 07/23/2022. CAD, Prior CABG, CKD; H/O DVT; Risk                 Factors:Hypertension, Dyslipidemia and Former Smoker.  Sonographer:    Dondra Prader RVT RCS Referring Phys: [id number] VISHAL R PATEL  Sonographer Comments: Technically difficult study due to poor echo windows, suboptimal apical window and suboptimal subcostal window. IMPRESSIONS  1. No left ventricular thrombus is seen (Definity contrast was used). Left ventricular ejection fraction, by estimation, is 45 to 50%. The left ventricle has mildly decreased function. The left ventricle demonstrates global hypokinesis. Left ventricular  diastolic parameters are consistent with Grade I diastolic dysfunction (impaired relaxation).  2. Right ventricular systolic function is mildly reduced. The right ventricular size is moderately enlarged. There is mildly elevated pulmonary artery systolic pressure. The estimated right ventricular systolic pressure is 38.0 mmHg.  3. Left atrial size was mild to moderately dilated.  4. Right atrial size was mildly dilated.  5. The mitral valve is normal in structure. No evidence of mitral valve regurgitation. No evidence of mitral stenosis.  6. The aortic valve is tricuspid. Aortic valve regurgitation is not visualized. No aortic stenosis is present.  7. The inferior vena cava is normal in size with greater than 50% respiratory variability, suggesting right atrial pressure of 3 mmHg. Comparison(s): No significant change from prior study. Prior images reviewed side by side. FINDINGS  Left Ventricle: No left ventricular thrombus is seen (Definity contrast was used). Left ventricular ejection  fraction, by estimation, is 45 to 50%. The left ventricle has mildly decreased function. The left ventricle demonstrates global hypokinesis. The  left ventricular internal cavity size was normal in size. There is no left ventricular hypertrophy. Abnormal (paradoxical) septal motion consistent with post-operative status. Left ventricular diastolic parameters are consistent with Grade I diastolic dysfunction (impaired relaxation). Normal left ventricular filling pressure. Right Ventricle: The right ventricular size is moderately enlarged. No increase in right ventricular wall thickness. Right ventricular systolic function is mildly reduced. There is mildly elevated pulmonary artery systolic pressure. The tricuspid regurgitant velocity is 2.96 m/s, and with an assumed right atrial pressure of 3 mmHg, the estimated right ventricular systolic pressure is 38.0 mmHg. Left Atrium: Left atrial size was mild to moderately  dilated. Right Atrium: Right atrial size was mildly dilated. Pericardium: There is no evidence of pericardial effusion. Mitral Valve: The mitral valve is normal in structure. Mild mitral annular calcification. No evidence of mitral valve regurgitation. No evidence of mitral valve stenosis. Tricuspid Valve: The tricuspid valve is normal in structure. Tricuspid valve regurgitation is mild. Aortic Valve: The aortic valve is tricuspid. Aortic valve regurgitation is not visualized. No aortic stenosis is present. Aortic valve mean gradient measures 2.0 mmHg. Aortic valve peak gradient measures 3.0 mmHg. Aortic valve area, by VTI measures 2.84 cm. Pulmonic Valve: The pulmonic valve was normal in structure. Pulmonic valve regurgitation is not visualized. Aorta: The aortic root and ascending aorta are structurally normal, with no evidence of dilitation. Venous: The inferior vena cava is normal in size with greater than 50% respiratory variability, suggesting right atrial pressure of 3 mmHg. IAS/Shunts: No atrial  level shunt detected by color flow Doppler.  LEFT VENTRICLE PLAX 2D LVIDd:         5.10 cm   Diastology LVIDs:         3.70 cm   LV e' medial:    6.31 cm/s LV PW:         1.20 cm   LV E/e' medial:  8.3 LV IVS:        1.00 cm   LV e' lateral:   7.83 cm/s LVOT diam:     2.20 cm   LV E/e' lateral: 6.7 LV SV:         45 LV SV Index:   21 LVOT Area:     3.80 cm  RIGHT VENTRICLE             IVC RV S prime:     10.70 cm/s  IVC diam: 1.90 cm TAPSE (M-mode): 1.9 cm LEFT ATRIUM           Index        RIGHT ATRIUM           Index LA diam:      4.30 cm 1.99 cm/m   RA Area:     18.65 cm LA Vol (A4C): 59.7 ml 27.60 ml/m  RA Volume:   51.40 ml  23.76 ml/m  AORTIC VALVE                    PULMONIC VALVE AV Area (Vmax):    3.34 cm     PV Vmax:          0.63 m/s AV Area (Vmean):   3.15 cm     PV Peak grad:     1.6 mmHg AV Area (VTI):     2.84 cm     PR End Diast Vel: 7.84 msec AV Vmax:           86.90 cm/s AV Vmean:          57.400 cm/s AV VTI:            0.158 m AV Peak Grad:      3.0 mmHg AV Mean Grad:      2.0 mmHg LVOT Vmax:         76.30 cm/s LVOT Vmean:        47.600 cm/s LVOT VTI:          0.118 m LVOT/AV VTI ratio: 0.75  AORTA Ao Root diam: 3.60 cm Ao Asc diam:  3.60 cm MITRAL VALVE  TRICUSPID VALVE MV Area (PHT): 3.91 cm    TR Peak grad:   35.0 mmHg MV Decel Time: 194 msec    TR Vmax:        296.00 cm/s MV E velocity: 52.30 cm/s MV A velocity: 81.00 cm/s  SHUNTS MV E/A ratio:  0.65        Systemic VTI:  0.12 m                            Systemic Diam: 2.20 cm Rachelle Hora Croitoru MD Electronically signed by Thurmon Fair MD Signature Date/Time: 10/31/2022/12:30:52 PM    Final      Medical Consultants:   None.   Subjective:    Ana L Loretto relates his breathing feels significantly better today.  He was all night without the oxygen.  Objective:    Vitals:   11/01/22 1949 11/01/22 2028 11/02/22 0506 11/02/22 0600  BP:  123/67 109/66   Pulse:  86 84   Resp:  20 15   Temp:  98.1 F (36.7 C)  97.7 F (36.5 C)   TempSrc:  Oral Oral   SpO2: 98% 94% 91%   Weight:    92.5 kg  Height:       SpO2: 91 % O2 Flow Rate (L/min): 1.5 L/min   Intake/Output Summary (Last 24 hours) at 11/02/2022 0904 Last data filed at 11/02/2022 0852 Gross per 24 hour  Intake 1369.42 ml  Output 1375 ml  Net -5.58 ml   Filed Weights   10/31/22 0624 11/01/22 0500 11/02/22 0600  Weight: 94 kg 96.2 kg 92.5 kg    Exam: General exam: In no acute distress. Respiratory system: Good air movement and crackles at bases bilaterally Cardiovascular system: S1 & S2 heard, RRR. No JVD. Gastrointestinal system: Abdomen is nondistended, soft and nontender.  Extremities: No pedal edema. Skin: No rashes, lesions or ulcers Psychiatry: Judgement and insight appear normal. Mood & affect appropriate. Data Reviewed:    Labs: Basic Metabolic Panel: Recent Labs  Lab 10/30/22 1037 10/31/22 0458 11/01/22 0353 11/02/22 0417  NA 134* 133* 134* 137  K 4.0 3.5 5.1 4.6  CL 101 99 104 107  CO2 22 21* 21* 21*  GLUCOSE 138* 239* 175* 122*  BUN 22 27* 45* 43*  CREATININE 1.27* 1.46* 1.34* 1.42*  CALCIUM 8.8* 8.5* 8.4* 8.5*  MG  --  2.1  --   --    GFR Estimated Creatinine Clearance: 37.9 mL/min (A) (by C-G formula based on SCr of 1.42 mg/dL (H)). Liver Function Tests: Recent Labs  Lab 10/30/22 1037  AST 27  ALT 22  ALKPHOS 68  BILITOT 1.1  PROT 6.7  ALBUMIN 3.0*   No results for input(s): "LIPASE", "AMYLASE" in the last 168 hours. No results for input(s): "AMMONIA" in the last 168 hours. Coagulation profile No results for input(s): "INR", "PROTIME" in the last 168 hours. COVID-19 Labs  No results for input(s): "DDIMER", "FERRITIN", "LDH", "CRP" in the last 72 hours.  Lab Results  Component Value Date   SARSCOV2NAA NEGATIVE 10/30/2022   SARSCOV2NAA NEGATIVE 06/14/2020    CBC: Recent Labs  Lab 10/30/22 1037 10/31/22 0458  WBC 7.3 4.6  NEUTROABS 5.0  --   HGB 16.0 15.9  HCT 48.2 50.1  MCV  92.3 96.2  PLT 199 201   Cardiac Enzymes: No results for input(s): "CKTOTAL", "CKMB", "CKMBINDEX", "TROPONINI" in the last 168 hours. BNP (last 3 results) No results for input(s): "  PROBNP" in the last 8760 hours. CBG: No results for input(s): "GLUCAP" in the last 168 hours. D-Dimer: No results for input(s): "DDIMER" in the last 72 hours. Hgb A1c: No results for input(s): "HGBA1C" in the last 72 hours. Lipid Profile: No results for input(s): "CHOL", "HDL", "LDLCALC", "TRIG", "CHOLHDL", "LDLDIRECT" in the last 72 hours. Thyroid function studies: No results for input(s): "TSH", "T4TOTAL", "T3FREE", "THYROIDAB" in the last 72 hours.  Invalid input(s): "FREET3" Anemia work up: No results for input(s): "VITAMINB12", "FOLATE", "FERRITIN", "TIBC", "IRON", "RETICCTPCT" in the last 72 hours. Sepsis Labs: Recent Labs  Lab 10/30/22 1037 10/31/22 0458  PROCALCITON  --  0.10  WBC 7.3 4.6   Microbiology Recent Results (from the past 240 hour(s))  Resp panel by RT-PCR (RSV, Flu A&B, Covid) Anterior Nasal Swab     Status: None   Collection Time: 10/30/22 10:37 AM   Specimen: Anterior Nasal Swab  Result Value Ref Range Status   SARS Coronavirus 2 by RT PCR NEGATIVE NEGATIVE Final    Comment: (NOTE) SARS-CoV-2 target nucleic acids are NOT DETECTED.  The SARS-CoV-2 RNA is generally detectable in upper respiratory specimens during the acute phase of infection. The lowest concentration of SARS-CoV-2 viral copies this assay can detect is 138 copies/mL. A negative result does not preclude SARS-Cov-2 infection and should not be used as the sole basis for treatment or other patient management decisions. A negative result may occur with  improper specimen collection/handling, submission of specimen other than nasopharyngeal swab, presence of viral mutation(s) within the areas targeted by this assay, and inadequate number of viral copies(<138 copies/mL). A negative result must be combined  with clinical observations, patient history, and epidemiological information. The expected result is Negative.  Fact Sheet for Patients:  BloggerCourse.com  Fact Sheet for Healthcare Providers:  SeriousBroker.it  This test is no t yet approved or cleared by the [place] FDA and  has been authorized for detection and/or diagnosis of SARS-CoV-2 by FDA under an Emergency Use Authorization (EUA). This EUA will remain  in effect (meaning this test can be used) for the duration of the COVID-19 declaration under Section 564(b)(1) of the Act, 21 U.S.C.section 360bbb-3(b)(1), unless the authorization is terminated  or revoked sooner.       Influenza A by PCR NEGATIVE NEGATIVE Final   Influenza B by PCR NEGATIVE NEGATIVE Final    Comment: (NOTE) The Xpert Xpress SARS-CoV-2/FLU/RSV plus assay is intended as an aid in the diagnosis of influenza from Nasopharyngeal swab specimens and should not be used as a sole basis for treatment. Nasal washings and aspirates are unacceptable for Xpert Xpress SARS-CoV-2/FLU/RSV testing.  Fact Sheet for Patients: BloggerCourse.com  Fact Sheet for Healthcare Providers: SeriousBroker.it  This test is not yet approved or cleared by the [place] FDA and has been authorized for detection and/or diagnosis of SARS-CoV-2 by FDA under an Emergency Use Authorization (EUA). This EUA will remain in effect (meaning this test can be used) for the duration of the COVID-19 declaration under Section 564(b)(1) of the Act, 21 U.S.C. section 360bbb-3(b)(1), unless the authorization is terminated or revoked.     Resp Syncytial Virus by PCR NEGATIVE NEGATIVE Final    Comment: (NOTE) Fact Sheet for Patients: BloggerCourse.com  Fact Sheet for Healthcare Providers: SeriousBroker.it  This test is not yet approved  or cleared by the [place] FDA and has been authorized for detection and/or diagnosis of SARS-CoV-2 by FDA under an Emergency Use Authorization (EUA). This EUA will remain in effect (  meaning this test can be used) for the duration of the COVID-19 declaration under Section 564(b)(1) of the Act, 21 U.S.C. section 360bbb-3(b)(1), unless the authorization is terminated or revoked.  Performed at [facility], [address] [place], [place] [address]   Respiratory (~20 pathogens) panel by PCR     Status: None   Collection Time: 10/31/22 11:29 AM   Specimen: Nasopharyngeal Swab; Respiratory  Result Value Ref Range Status   Adenovirus NOT DETECTED NOT DETECTED Final   Coronavirus 229E NOT DETECTED NOT DETECTED Final    Comment: (NOTE) The Coronavirus on the Respiratory Panel, DOES NOT test for the novel  Coronavirus (2019 nCoV)    Coronavirus HKU1 NOT DETECTED NOT DETECTED Final   Coronavirus NL63 NOT DETECTED NOT DETECTED Final   Coronavirus OC43 NOT DETECTED NOT DETECTED Final   Metapneumovirus NOT DETECTED NOT DETECTED Final   Rhinovirus / Enterovirus NOT DETECTED NOT DETECTED Final   Influenza A NOT DETECTED NOT DETECTED Final   Influenza B NOT DETECTED NOT DETECTED Final   Parainfluenza Virus 1 NOT DETECTED NOT DETECTED Final   Parainfluenza Virus 2 NOT DETECTED NOT DETECTED Final   Parainfluenza Virus 3 NOT DETECTED NOT DETECTED Final   Parainfluenza Virus 4 NOT DETECTED NOT DETECTED Final   Respiratory Syncytial Virus NOT DETECTED NOT DETECTED Final   Bordetella pertussis NOT DETECTED NOT DETECTED Final   Bordetella Parapertussis NOT DETECTED NOT DETECTED Final   Chlamydophila pneumoniae NOT DETECTED NOT DETECTED Final   Mycoplasma pneumoniae NOT DETECTED NOT DETECTED Final    Comment: Performed at [facility], [address], [place], [place] [address]     Medications:    albuterol  2.5 mg Nebulization BID   apixaban  5 mg Oral BID   atorvastatin   40 mg Oral QHS   docusate sodium  200 mg Oral Daily   doxycycline  100 mg Oral Q12H   empagliflozin  10 mg Oral Daily   gabapentin  300 mg Oral 2 times per day   And   gabapentin  600 mg Oral 2 times per day   levothyroxine  50 mcg Oral Q0600   melatonin  10 mg Oral QHS   oxybutynin  5 mg Oral QHS   sodium chloride flush  3 mL Intravenous Q12H   tamsulosin  0.4 mg Oral QHS   Continuous Infusions:  cefTRIAXone (ROCEPHIN)  IV 1 g (11/01/22 1714)      LOS: 3 days   Marinda Elk  [facility]  11/02/2022, 9:04 AM

## 2022-11-02 NOTE — Progress Notes (Signed)
   Patient Name: DADRIAN DIETZ Date of Encounter: 11/02/2022 [facility] Cardiologist: Rollene Rotunda, MD   Interval Summary  .    Patient feeling well this morning. He reports no acute shortness of breath, chest pain, or palpitations. He is concerned about whether or not he will still be able to undergo pending bilateral lid blepharoplasty on 11/18/22.   Vital Signs .    Vitals:   11/01/22 1949 11/01/22 2028 11/02/22 0506 11/02/22 0600  BP:  123/67 109/66   Pulse:  86 84   Resp:  20 15   Temp:  98.1 F (36.7 C) 97.7 F (36.5 C)   TempSrc:  Oral Oral   SpO2: 98% 94% 91%   Weight:    92.5 kg  Height:        Intake/Output Summary (Last 24 hours) at 11/02/2022 0828 Last data filed at 11/02/2022 0644 Gross per 24 hour  Intake 1269.42 ml  Output 1175 ml  Net 94.42 ml      11/02/2022    6:00 AM 11/01/2022    5:00 AM 10/31/2022    6:24 AM  Last 3 Weights  Weight (lbs) 203 lb 14.8 oz 212 lb 1.3 oz 207 lb 3.7 oz  Weight (kg) 92.5 kg 96.2 kg 94 kg      Telemetry/ECG    Sinus rhythm/sinus tachycardia with PVCs. Significant baseline artifact this morning. RBBB morphology also noted- Personally Reviewed  Physical Exam .   GEN: No acute distress.   Neck: No JVD Cardiac: irregular with PVCs, no murmurs, rubs, or gallops.  Respiratory: Left lower lobe crackles. Otherwise clear GI: Soft, nontender, non-distended  MS: No edema  Assessment & Plan .     Heart failure with mildly reduced LVEF Likely Pulmonary hypertension (group III) secondary to COPD  TTE this admission with significant RV dilation and dysfunction, evidence of pressure/volume overload, low normal LV function 45 to 50%.  No beta-blocker given previously seen pauses Continue SGLT2 BP borderline low this morning, will defer initiation of ARB Could also consider MRA Patient's respiratory status continues to improve with treatment of COPD/possible bacterial pneumonia.  Atrial  fibrillation/flutter  Post ablation of typical atrial flutter in June of this year.  Maintaining sinus rhythm this admission with right bundle branch block.  Rates elevated this AM. Difficult to clearly discern rhythm with baseline artifact, ECG ordered. Would prefer to avoid AV nodal agents and amiodarone as patient previously noted to have pauses when taking these. Continue Eliquis  CAD status post CABG  Patient without chest pain. HS troponin negative this admission.  Continue daily statin No ASA with Eliquis  CKD  Creatinine overall stable: 1.46->1.34->1.42. Continue SGLT2  For questions or updates, please contact [facility] Please consult [email or web address] for contact info under        Signed, Perlie Gold, PA-C

## 2022-11-03 DIAGNOSIS — I480 Paroxysmal atrial fibrillation: Secondary | ICD-10-CM | POA: Diagnosis not present

## 2022-11-03 DIAGNOSIS — J9601 Acute respiratory failure with hypoxia: Secondary | ICD-10-CM | POA: Diagnosis not present

## 2022-11-03 DIAGNOSIS — J441 Chronic obstructive pulmonary disease with (acute) exacerbation: Secondary | ICD-10-CM | POA: Diagnosis not present

## 2022-11-03 DIAGNOSIS — I509 Heart failure, unspecified: Secondary | ICD-10-CM | POA: Diagnosis not present

## 2022-11-03 LAB — BASIC METABOLIC PANEL
Anion gap: 8 (ref 5–15)
BUN: 46 mg/dL — ABNORMAL HIGH (ref 8–23)
CO2: 24 mmol/L (ref 22–32)
Calcium: 8.6 mg/dL — ABNORMAL LOW (ref 8.9–10.3)
Chloride: 106 mmol/L (ref 98–111)
Creatinine, Ser: 1.44 mg/dL — ABNORMAL HIGH (ref 0.61–1.24)
GFR, Estimated: 46 mL/min — ABNORMAL LOW (ref >60)
Glucose, Bld: 100 mg/dL — ABNORMAL HIGH (ref 70–99)
Potassium: 4.4 mmol/L (ref 3.5–5.1)
Sodium: 138 mmol/L (ref 135–145)

## 2022-11-03 MED ORDER — PREDNISONE 10 MG PO TABS: ORAL_TABLET | ORAL | 0 refills | Status: DC

## 2022-11-03 MED ORDER — DOXYCYCLINE HYCLATE 100 MG PO TABS: 100.0000 mg | ORAL_TABLET | Freq: Two times a day (BID) | ORAL | 0 refills | Status: AC

## 2022-11-03 MED ORDER — EMPAGLIFLOZIN 10 MG PO TABS: 10.0000 mg | ORAL_TABLET | Freq: Every day | ORAL | 1 refills | Status: DC

## 2022-11-03 MED ORDER — ALBUTEROL SULFATE HFA 108 (90 BASE) MCG/ACT IN AERS
2.0000 | INHALATION_SPRAY | RESPIRATORY_TRACT | Status: DC | PRN
  Administered 2022-11-03: 2 via RESPIRATORY_TRACT
  Filled 2022-11-03: qty 6.7

## 2022-11-03 MED ORDER — CEFDINIR 300 MG PO CAPS: 300.0000 mg | ORAL_CAPSULE | Freq: Two times a day (BID) | ORAL | 0 refills | Status: AC

## 2022-11-03 NOTE — Discharge Summary (Signed)
Physician Discharge Summary  Marc Gilbert UXL:[id number] DOB: 1931/02/10 DOA: 10/30/2022  PCP: Collene Mares, PA  Admit date: 10/30/2022 Discharge date: 11/03/2022  Admitted From: Home Disposition:  Home  Recommendations for Outpatient Follow-up:  Follow up with Cardiology in 1-2 weeks Please obtain BMP/CBC in one week   Home Health:No Equipment/Devices:Oxygen 2 L for  2 weeks  Discharge Condition:Stable CODE STATUS:Full Diet recommendation: Heart Healthy   Brief/Interim Summary: 89 y.o. male past medical history significant for chronic diastolic heart failure, paroxysmal atrial fibrillation, status post ablation in 08/10/2022 on Eliquis, history of coronary artery disease status post CABG, chronic kidney see stage IIIa, hypothyroidism, hyperlipidemia comes in with shortness of breath that started about 1 week prior to admission with a productive cough in the ED was found to be hypoxic in the 70s chest x-ray showed pulmonary edema, left AGAINST MEDICAL ADVICE and returned back to the ED.   Discharge Diagnoses:  Principal Problem:   Acute respiratory failure with hypoxia (HCC) Active Problems:   COPD (chronic obstructive pulmonary disease) (HCC)   Chronic kidney disease, stage 3a (HCC)   Coronary artery disease of native heart with stable angina pectoris (HCC)   Hypothyroidism   Paroxysmal atrial fibrillation (HCC)  Acute respiratory failure with hypoxia possibly due to COPD exacerbation versus atypical pneumonia: On admission he was started on IV empiric antibiotics and steroids. He was requiring 2 L of oxygen. We were able to wean him to room air, it is unfortunate that when he ambulated and saturations dropped below 88. So he will be sent home on oxygen He will continue steroids and an antibiotic taper as an outpatient. Physical therapy evaluated the patient recommended home health PT. He will go home on 2 L of oxygen for like around 2 weeks.  Chronic systolic heart  failure mildly reduced ejection fraction in the setting of suspected group 3 pulmonary hypertension secondary to COPD: He was started on Jardiance follow-up with PCP as an outpatient.  COPD: He will go home on oxygen temporarily on a steroid taper.  See above for further details.  Paroxysmal atrial fibrillation: Sinus rhythm rate control continue Eliquis no AV nodal blocking agents.  Chronic disease stage IIIa: Creatinine appears to be at baseline.  Coronary artery disease: Continue statin and Eliquis.  Hypothyroidism: Continue Synthroid.   Discharge Instructions  Discharge Instructions     Diet - low sodium heart healthy   Complete by: As directed    Increase activity slowly   Complete by: As directed       Allergies as of 11/03/2022   No Known Allergies      Medication List     TAKE these medications    acetaminophen 500 MG tablet Commonly known as: TYLENOL Take 1,000 mg by mouth every 6 (six) hours as needed for mild pain or moderate pain (for pain).   apixaban 5 MG Tabs tablet Commonly known as: ELIQUIS Take 1 tablet (5 mg total) by mouth 2 (two) times daily.   atorvastatin 40 MG tablet Commonly known as: LIPITOR Take 1 tablet (40 mg total) by mouth daily. What changed: when to take this   cefdinir 300 MG capsule Commonly known as: OMNICEF Take 1 capsule (300 mg total) by mouth every 12 (twelve) hours for 3 days.   diclofenac Sodium 1 % Gel Commonly known as: VOLTAREN Apply 1 application  topically See admin instructions. Apply 2-4 grams of gel topically to both knees once a day   docusate sodium 250 MG  Physician Discharge Summary  Marc Gilbert UXL:[id number] DOB: 1931/02/10 DOA: 10/30/2022  PCP: Collene Mares, PA  Admit date: 10/30/2022 Discharge date: 11/03/2022  Admitted From: Home Disposition:  Home  Recommendations for Outpatient Follow-up:  Follow up with Cardiology in 1-2 weeks Please obtain BMP/CBC in one week   Home Health:No Equipment/Devices:Oxygen 2 L for  2 weeks  Discharge Condition:Stable CODE STATUS:Full Diet recommendation: Heart Healthy   Brief/Interim Summary: 89 y.o. male past medical history significant for chronic diastolic heart failure, paroxysmal atrial fibrillation, status post ablation in 08/10/2022 on Eliquis, history of coronary artery disease status post CABG, chronic kidney see stage IIIa, hypothyroidism, hyperlipidemia comes in with shortness of breath that started about 1 week prior to admission with a productive cough in the ED was found to be hypoxic in the 70s chest x-ray showed pulmonary edema, left AGAINST MEDICAL ADVICE and returned back to the ED.   Discharge Diagnoses:  Principal Problem:   Acute respiratory failure with hypoxia (HCC) Active Problems:   COPD (chronic obstructive pulmonary disease) (HCC)   Chronic kidney disease, stage 3a (HCC)   Coronary artery disease of native heart with stable angina pectoris (HCC)   Hypothyroidism   Paroxysmal atrial fibrillation (HCC)  Acute respiratory failure with hypoxia possibly due to COPD exacerbation versus atypical pneumonia: On admission he was started on IV empiric antibiotics and steroids. He was requiring 2 L of oxygen. We were able to wean him to room air, it is unfortunate that when he ambulated and saturations dropped below 88. So he will be sent home on oxygen He will continue steroids and an antibiotic taper as an outpatient. Physical therapy evaluated the patient recommended home health PT. He will go home on 2 L of oxygen for like around 2 weeks.  Chronic systolic heart  failure mildly reduced ejection fraction in the setting of suspected group 3 pulmonary hypertension secondary to COPD: He was started on Jardiance follow-up with PCP as an outpatient.  COPD: He will go home on oxygen temporarily on a steroid taper.  See above for further details.  Paroxysmal atrial fibrillation: Sinus rhythm rate control continue Eliquis no AV nodal blocking agents.  Chronic disease stage IIIa: Creatinine appears to be at baseline.  Coronary artery disease: Continue statin and Eliquis.  Hypothyroidism: Continue Synthroid.   Discharge Instructions  Discharge Instructions     Diet - low sodium heart healthy   Complete by: As directed    Increase activity slowly   Complete by: As directed       Allergies as of 11/03/2022   No Known Allergies      Medication List     TAKE these medications    acetaminophen 500 MG tablet Commonly known as: TYLENOL Take 1,000 mg by mouth every 6 (six) hours as needed for mild pain or moderate pain (for pain).   apixaban 5 MG Tabs tablet Commonly known as: ELIQUIS Take 1 tablet (5 mg total) by mouth 2 (two) times daily.   atorvastatin 40 MG tablet Commonly known as: LIPITOR Take 1 tablet (40 mg total) by mouth daily. What changed: when to take this   cefdinir 300 MG capsule Commonly known as: OMNICEF Take 1 capsule (300 mg total) by mouth every 12 (twelve) hours for 3 days.   diclofenac Sodium 1 % Gel Commonly known as: VOLTAREN Apply 1 application  topically See admin instructions. Apply 2-4 grams of gel topically to both knees once a day   docusate sodium 250 MG  02-23-1930      BSA:          2.163 m Patient Age:    89 years       BP:           107/57 mmHg Patient Gender: M              HR:           83 bpm. Exam Location:  Inpatient Procedure: 2D Echo, Cardiac Doppler, Color Doppler and Intracardiac            Opacification Agent Indications:    CHF I50.31  History:        Patient has prior history of Echocardiogram examinations, most                 recent 07/23/2022. CAD, Prior CABG, CKD; H/O DVT; Risk                 Factors:Hypertension, Dyslipidemia and Former Smoker.  Sonographer:    Dondra Prader RVT RCS Referring Phys: [id number] VISHAL R PATEL  Sonographer Comments: Technically difficult study due to poor echo windows, suboptimal apical window and suboptimal subcostal window. IMPRESSIONS  1. No left ventricular thrombus is seen (Definity contrast was used). Left ventricular ejection fraction, by estimation, is 45 to 50%. The left ventricle has mildly decreased function. The left ventricle demonstrates global hypokinesis. Left ventricular  diastolic parameters are consistent with Grade I diastolic dysfunction (impaired relaxation).  2. Right ventricular systolic function is mildly reduced. The right ventricular size is moderately enlarged. There is mildly elevated pulmonary artery systolic pressure. The estimated right ventricular systolic  pressure is 38.0 mmHg.  3. Left atrial size was mild to moderately dilated.  4. Right atrial size was mildly dilated.  5. The mitral valve is normal in structure. No evidence of mitral valve regurgitation. No evidence of mitral stenosis.  6. The aortic valve is tricuspid. Aortic valve regurgitation is not visualized. No aortic stenosis is present.  7. The inferior vena cava is normal in size with greater than 50% respiratory variability, suggesting right atrial pressure of 3 mmHg. Comparison(s): No significant change from prior study. Prior images reviewed side by side. FINDINGS  Left Ventricle: No left ventricular thrombus is seen (Definity contrast was used). Left ventricular ejection fraction, by estimation, is 45 to 50%. The left ventricle has mildly decreased function. The left ventricle demonstrates global hypokinesis. The  left ventricular internal cavity size was normal in size. There is no left ventricular hypertrophy. Abnormal (paradoxical) septal motion consistent with post-operative status. Left ventricular diastolic parameters are consistent with Grade I diastolic dysfunction (impaired relaxation). Normal left ventricular filling pressure. Right Ventricle: The right ventricular size is moderately enlarged. No increase in right ventricular wall thickness. Right ventricular systolic function is mildly reduced. There is mildly elevated pulmonary artery systolic pressure. The tricuspid regurgitant velocity is 2.96 m/s, and with an assumed right atrial pressure of 3 mmHg, the estimated right ventricular systolic pressure is 38.0 mmHg. Left Atrium: Left atrial size was mild to moderately dilated. Right Atrium: Right atrial size was mildly dilated. Pericardium: There is no evidence of pericardial effusion. Mitral Valve: The mitral valve is normal in structure. Mild mitral annular calcification. No evidence of mitral valve regurgitation. No evidence of mitral valve stenosis. Tricuspid Valve: The tricuspid valve  is normal in structure. Tricuspid valve regurgitation is mild. Aortic Valve: The aortic valve is tricuspid. Aortic valve regurgitation is not visualized. No aortic stenosis is present. Aortic valve  02-23-1932      BSA:          2.163 m Patient Age:    89 years       BP:           107/57 mmHg Patient Gender: M              HR:           83 bpm. Exam Location:  Inpatient Procedure: 2D Echo, Cardiac Doppler, Color Doppler and Intracardiac            Opacification Agent Indications:    CHF I50.31  History:        Patient has prior history of Echocardiogram examinations, most                 recent 07/23/2022. CAD, Prior CABG, CKD; H/O DVT; Risk                 Factors:Hypertension, Dyslipidemia and Former Smoker.  Sonographer:    Dondra Prader RVT RCS Referring Phys: [id number] VISHAL R PATEL  Sonographer Comments: Technically difficult study due to poor echo windows, suboptimal apical window and suboptimal subcostal window. IMPRESSIONS  1. No left ventricular thrombus is seen (Definity contrast was used). Left ventricular ejection fraction, by estimation, is 45 to 50%. The left ventricle has mildly decreased function. The left ventricle demonstrates global hypokinesis. Left ventricular  diastolic parameters are consistent with Grade I diastolic dysfunction (impaired relaxation).  2. Right ventricular systolic function is mildly reduced. The right ventricular size is moderately enlarged. There is mildly elevated pulmonary artery systolic pressure. The estimated right ventricular systolic  pressure is 38.0 mmHg.  3. Left atrial size was mild to moderately dilated.  4. Right atrial size was mildly dilated.  5. The mitral valve is normal in structure. No evidence of mitral valve regurgitation. No evidence of mitral stenosis.  6. The aortic valve is tricuspid. Aortic valve regurgitation is not visualized. No aortic stenosis is present.  7. The inferior vena cava is normal in size with greater than 50% respiratory variability, suggesting right atrial pressure of 3 mmHg. Comparison(s): No significant change from prior study. Prior images reviewed side by side. FINDINGS  Left Ventricle: No left ventricular thrombus is seen (Definity contrast was used). Left ventricular ejection fraction, by estimation, is 45 to 50%. The left ventricle has mildly decreased function. The left ventricle demonstrates global hypokinesis. The  left ventricular internal cavity size was normal in size. There is no left ventricular hypertrophy. Abnormal (paradoxical) septal motion consistent with post-operative status. Left ventricular diastolic parameters are consistent with Grade I diastolic dysfunction (impaired relaxation). Normal left ventricular filling pressure. Right Ventricle: The right ventricular size is moderately enlarged. No increase in right ventricular wall thickness. Right ventricular systolic function is mildly reduced. There is mildly elevated pulmonary artery systolic pressure. The tricuspid regurgitant velocity is 2.96 m/s, and with an assumed right atrial pressure of 3 mmHg, the estimated right ventricular systolic pressure is 38.0 mmHg. Left Atrium: Left atrial size was mild to moderately dilated. Right Atrium: Right atrial size was mildly dilated. Pericardium: There is no evidence of pericardial effusion. Mitral Valve: The mitral valve is normal in structure. Mild mitral annular calcification. No evidence of mitral valve regurgitation. No evidence of mitral valve stenosis. Tricuspid Valve: The tricuspid valve  is normal in structure. Tricuspid valve regurgitation is mild. Aortic Valve: The aortic valve is tricuspid. Aortic valve regurgitation is not visualized. No aortic stenosis is present. Aortic valve  02-23-1932      BSA:          2.163 m Patient Age:    89 years       BP:           107/57 mmHg Patient Gender: M              HR:           83 bpm. Exam Location:  Inpatient Procedure: 2D Echo, Cardiac Doppler, Color Doppler and Intracardiac            Opacification Agent Indications:    CHF I50.31  History:        Patient has prior history of Echocardiogram examinations, most                 recent 07/23/2022. CAD, Prior CABG, CKD; H/O DVT; Risk                 Factors:Hypertension, Dyslipidemia and Former Smoker.  Sonographer:    Dondra Prader RVT RCS Referring Phys: [id number] VISHAL R PATEL  Sonographer Comments: Technically difficult study due to poor echo windows, suboptimal apical window and suboptimal subcostal window. IMPRESSIONS  1. No left ventricular thrombus is seen (Definity contrast was used). Left ventricular ejection fraction, by estimation, is 45 to 50%. The left ventricle has mildly decreased function. The left ventricle demonstrates global hypokinesis. Left ventricular  diastolic parameters are consistent with Grade I diastolic dysfunction (impaired relaxation).  2. Right ventricular systolic function is mildly reduced. The right ventricular size is moderately enlarged. There is mildly elevated pulmonary artery systolic pressure. The estimated right ventricular systolic  pressure is 38.0 mmHg.  3. Left atrial size was mild to moderately dilated.  4. Right atrial size was mildly dilated.  5. The mitral valve is normal in structure. No evidence of mitral valve regurgitation. No evidence of mitral stenosis.  6. The aortic valve is tricuspid. Aortic valve regurgitation is not visualized. No aortic stenosis is present.  7. The inferior vena cava is normal in size with greater than 50% respiratory variability, suggesting right atrial pressure of 3 mmHg. Comparison(s): No significant change from prior study. Prior images reviewed side by side. FINDINGS  Left Ventricle: No left ventricular thrombus is seen (Definity contrast was used). Left ventricular ejection fraction, by estimation, is 45 to 50%. The left ventricle has mildly decreased function. The left ventricle demonstrates global hypokinesis. The  left ventricular internal cavity size was normal in size. There is no left ventricular hypertrophy. Abnormal (paradoxical) septal motion consistent with post-operative status. Left ventricular diastolic parameters are consistent with Grade I diastolic dysfunction (impaired relaxation). Normal left ventricular filling pressure. Right Ventricle: The right ventricular size is moderately enlarged. No increase in right ventricular wall thickness. Right ventricular systolic function is mildly reduced. There is mildly elevated pulmonary artery systolic pressure. The tricuspid regurgitant velocity is 2.96 m/s, and with an assumed right atrial pressure of 3 mmHg, the estimated right ventricular systolic pressure is 38.0 mmHg. Left Atrium: Left atrial size was mild to moderately dilated. Right Atrium: Right atrial size was mildly dilated. Pericardium: There is no evidence of pericardial effusion. Mitral Valve: The mitral valve is normal in structure. Mild mitral annular calcification. No evidence of mitral valve regurgitation. No evidence of mitral valve stenosis. Tricuspid Valve: The tricuspid valve  is normal in structure. Tricuspid valve regurgitation is mild. Aortic Valve: The aortic valve is tricuspid. Aortic valve regurgitation is not visualized. No aortic stenosis is present. Aortic valve  02-23-1932      BSA:          2.163 m Patient Age:    89 years       BP:           107/57 mmHg Patient Gender: M              HR:           83 bpm. Exam Location:  Inpatient Procedure: 2D Echo, Cardiac Doppler, Color Doppler and Intracardiac            Opacification Agent Indications:    CHF I50.31  History:        Patient has prior history of Echocardiogram examinations, most                 recent 07/23/2022. CAD, Prior CABG, CKD; H/O DVT; Risk                 Factors:Hypertension, Dyslipidemia and Former Smoker.  Sonographer:    Dondra Prader RVT RCS Referring Phys: [id number] VISHAL R PATEL  Sonographer Comments: Technically difficult study due to poor echo windows, suboptimal apical window and suboptimal subcostal window. IMPRESSIONS  1. No left ventricular thrombus is seen (Definity contrast was used). Left ventricular ejection fraction, by estimation, is 45 to 50%. The left ventricle has mildly decreased function. The left ventricle demonstrates global hypokinesis. Left ventricular  diastolic parameters are consistent with Grade I diastolic dysfunction (impaired relaxation).  2. Right ventricular systolic function is mildly reduced. The right ventricular size is moderately enlarged. There is mildly elevated pulmonary artery systolic pressure. The estimated right ventricular systolic  pressure is 38.0 mmHg.  3. Left atrial size was mild to moderately dilated.  4. Right atrial size was mildly dilated.  5. The mitral valve is normal in structure. No evidence of mitral valve regurgitation. No evidence of mitral stenosis.  6. The aortic valve is tricuspid. Aortic valve regurgitation is not visualized. No aortic stenosis is present.  7. The inferior vena cava is normal in size with greater than 50% respiratory variability, suggesting right atrial pressure of 3 mmHg. Comparison(s): No significant change from prior study. Prior images reviewed side by side. FINDINGS  Left Ventricle: No left ventricular thrombus is seen (Definity contrast was used). Left ventricular ejection fraction, by estimation, is 45 to 50%. The left ventricle has mildly decreased function. The left ventricle demonstrates global hypokinesis. The  left ventricular internal cavity size was normal in size. There is no left ventricular hypertrophy. Abnormal (paradoxical) septal motion consistent with post-operative status. Left ventricular diastolic parameters are consistent with Grade I diastolic dysfunction (impaired relaxation). Normal left ventricular filling pressure. Right Ventricle: The right ventricular size is moderately enlarged. No increase in right ventricular wall thickness. Right ventricular systolic function is mildly reduced. There is mildly elevated pulmonary artery systolic pressure. The tricuspid regurgitant velocity is 2.96 m/s, and with an assumed right atrial pressure of 3 mmHg, the estimated right ventricular systolic pressure is 38.0 mmHg. Left Atrium: Left atrial size was mild to moderately dilated. Right Atrium: Right atrial size was mildly dilated. Pericardium: There is no evidence of pericardial effusion. Mitral Valve: The mitral valve is normal in structure. Mild mitral annular calcification. No evidence of mitral valve regurgitation. No evidence of mitral valve stenosis. Tricuspid Valve: The tricuspid valve  is normal in structure. Tricuspid valve regurgitation is mild. Aortic Valve: The aortic valve is tricuspid. Aortic valve regurgitation is not visualized. No aortic stenosis is present. Aortic valve  02-23-1932      BSA:          2.163 m Patient Age:    89 years       BP:           107/57 mmHg Patient Gender: M              HR:           83 bpm. Exam Location:  Inpatient Procedure: 2D Echo, Cardiac Doppler, Color Doppler and Intracardiac            Opacification Agent Indications:    CHF I50.31  History:        Patient has prior history of Echocardiogram examinations, most                 recent 07/23/2022. CAD, Prior CABG, CKD; H/O DVT; Risk                 Factors:Hypertension, Dyslipidemia and Former Smoker.  Sonographer:    Dondra Prader RVT RCS Referring Phys: [id number] VISHAL R PATEL  Sonographer Comments: Technically difficult study due to poor echo windows, suboptimal apical window and suboptimal subcostal window. IMPRESSIONS  1. No left ventricular thrombus is seen (Definity contrast was used). Left ventricular ejection fraction, by estimation, is 45 to 50%. The left ventricle has mildly decreased function. The left ventricle demonstrates global hypokinesis. Left ventricular  diastolic parameters are consistent with Grade I diastolic dysfunction (impaired relaxation).  2. Right ventricular systolic function is mildly reduced. The right ventricular size is moderately enlarged. There is mildly elevated pulmonary artery systolic pressure. The estimated right ventricular systolic  pressure is 38.0 mmHg.  3. Left atrial size was mild to moderately dilated.  4. Right atrial size was mildly dilated.  5. The mitral valve is normal in structure. No evidence of mitral valve regurgitation. No evidence of mitral stenosis.  6. The aortic valve is tricuspid. Aortic valve regurgitation is not visualized. No aortic stenosis is present.  7. The inferior vena cava is normal in size with greater than 50% respiratory variability, suggesting right atrial pressure of 3 mmHg. Comparison(s): No significant change from prior study. Prior images reviewed side by side. FINDINGS  Left Ventricle: No left ventricular thrombus is seen (Definity contrast was used). Left ventricular ejection fraction, by estimation, is 45 to 50%. The left ventricle has mildly decreased function. The left ventricle demonstrates global hypokinesis. The  left ventricular internal cavity size was normal in size. There is no left ventricular hypertrophy. Abnormal (paradoxical) septal motion consistent with post-operative status. Left ventricular diastolic parameters are consistent with Grade I diastolic dysfunction (impaired relaxation). Normal left ventricular filling pressure. Right Ventricle: The right ventricular size is moderately enlarged. No increase in right ventricular wall thickness. Right ventricular systolic function is mildly reduced. There is mildly elevated pulmonary artery systolic pressure. The tricuspid regurgitant velocity is 2.96 m/s, and with an assumed right atrial pressure of 3 mmHg, the estimated right ventricular systolic pressure is 38.0 mmHg. Left Atrium: Left atrial size was mild to moderately dilated. Right Atrium: Right atrial size was mildly dilated. Pericardium: There is no evidence of pericardial effusion. Mitral Valve: The mitral valve is normal in structure. Mild mitral annular calcification. No evidence of mitral valve regurgitation. No evidence of mitral valve stenosis. Tricuspid Valve: The tricuspid valve  is normal in structure. Tricuspid valve regurgitation is mild. Aortic Valve: The aortic valve is tricuspid. Aortic valve regurgitation is not visualized. No aortic stenosis is present. Aortic valve  Physician Discharge Summary  Marc Gilbert UXL:[id number] DOB: 1931/02/10 DOA: 10/30/2022  PCP: Collene Mares, PA  Admit date: 10/30/2022 Discharge date: 11/03/2022  Admitted From: Home Disposition:  Home  Recommendations for Outpatient Follow-up:  Follow up with Cardiology in 1-2 weeks Please obtain BMP/CBC in one week   Home Health:No Equipment/Devices:Oxygen 2 L for  2 weeks  Discharge Condition:Stable CODE STATUS:Full Diet recommendation: Heart Healthy   Brief/Interim Summary: 89 y.o. male past medical history significant for chronic diastolic heart failure, paroxysmal atrial fibrillation, status post ablation in 08/10/2022 on Eliquis, history of coronary artery disease status post CABG, chronic kidney see stage IIIa, hypothyroidism, hyperlipidemia comes in with shortness of breath that started about 1 week prior to admission with a productive cough in the ED was found to be hypoxic in the 70s chest x-ray showed pulmonary edema, left AGAINST MEDICAL ADVICE and returned back to the ED.   Discharge Diagnoses:  Principal Problem:   Acute respiratory failure with hypoxia (HCC) Active Problems:   COPD (chronic obstructive pulmonary disease) (HCC)   Chronic kidney disease, stage 3a (HCC)   Coronary artery disease of native heart with stable angina pectoris (HCC)   Hypothyroidism   Paroxysmal atrial fibrillation (HCC)  Acute respiratory failure with hypoxia possibly due to COPD exacerbation versus atypical pneumonia: On admission he was started on IV empiric antibiotics and steroids. He was requiring 2 L of oxygen. We were able to wean him to room air, it is unfortunate that when he ambulated and saturations dropped below 88. So he will be sent home on oxygen He will continue steroids and an antibiotic taper as an outpatient. Physical therapy evaluated the patient recommended home health PT. He will go home on 2 L of oxygen for like around 2 weeks.  Chronic systolic heart  failure mildly reduced ejection fraction in the setting of suspected group 3 pulmonary hypertension secondary to COPD: He was started on Jardiance follow-up with PCP as an outpatient.  COPD: He will go home on oxygen temporarily on a steroid taper.  See above for further details.  Paroxysmal atrial fibrillation: Sinus rhythm rate control continue Eliquis no AV nodal blocking agents.  Chronic disease stage IIIa: Creatinine appears to be at baseline.  Coronary artery disease: Continue statin and Eliquis.  Hypothyroidism: Continue Synthroid.   Discharge Instructions  Discharge Instructions     Diet - low sodium heart healthy   Complete by: As directed    Increase activity slowly   Complete by: As directed       Allergies as of 11/03/2022   No Known Allergies      Medication List     TAKE these medications    acetaminophen 500 MG tablet Commonly known as: TYLENOL Take 1,000 mg by mouth every 6 (six) hours as needed for mild pain or moderate pain (for pain).   apixaban 5 MG Tabs tablet Commonly known as: ELIQUIS Take 1 tablet (5 mg total) by mouth 2 (two) times daily.   atorvastatin 40 MG tablet Commonly known as: LIPITOR Take 1 tablet (40 mg total) by mouth daily. What changed: when to take this   cefdinir 300 MG capsule Commonly known as: OMNICEF Take 1 capsule (300 mg total) by mouth every 12 (twelve) hours for 3 days.   diclofenac Sodium 1 % Gel Commonly known as: VOLTAREN Apply 1 application  topically See admin instructions. Apply 2-4 grams of gel topically to both knees once a day   docusate sodium 250 MG

## 2022-11-03 NOTE — Progress Notes (Signed)
   Patient Name: Marc Gilbert Date of Encounter: 11/03/2022 [facility] Cardiologist: Rollene Rotunda, MD   Interval Summary  .    Appears patient is likely discharged today.  Patient without any complaints.  We discussed in depth labile ongoing management of his heart failure.  Vital Signs .    Vitals:   11/02/22 1932 11/02/22 2120 11/03/22 0431 11/03/22 0930  BP:  120/75 113/69   Pulse:  77 81   Resp:  19 20   Temp:  98.6 F (37 C) 97.9 F (36.6 C)   TempSrc:  Oral Oral   SpO2: 94% (!) 89% 92% 96%  Weight:   94.5 kg   Height:        Intake/Output Summary (Last 24 hours) at 11/03/2022 1024 Last data filed at 11/03/2022 0430 Gross per 24 hour  Intake 1083 ml  Output 1475 ml  Net -392 ml      11/03/2022    4:31 AM 11/02/2022    6:00 AM 11/01/2022    5:00 AM  Last 3 Weights  Weight (lbs) 208 lb 5.4 oz 203 lb 14.8 oz 212 lb 1.3 oz  Weight (kg) 94.5 kg 92.5 kg 96.2 kg      Telemetry/ECG    Normal sinus rhythm with heart rates in the 60s.  PVCs.- Personally Reviewed  CV Studies     Physical Exam .   GEN: No acute distress.   Neck: No JVD Cardiac: RRR, no murmurs, rubs, or gallops.  Respiratory: Crackles in the lower lobes GI: Soft, nontender, non-distended  MS: No edema  Patient Profile    Marc Gilbert is a 89 y.o. male has history of coronary artery disease (s/p CABG 2018), atrial fibrillation s/p ablation 07/2022 and with prior pauses on AV nodal therapy, CKD stage IIIa, and COPD  and admitted on 10/30/2022 for the evaluation of shortness of breath.  He was initially found to be hypoxic with O2 saturations in the 70s and evidence of pulmonary congestion on chest x-ray.    Assessment & Plan .     Chronic HFmrEF Pulmonary hypertension group 3, suspected Echocardiogram shows EF 45 to 50%.  Mildly reduced RV function.  RVSP 38.  Moderately dilated LA.  Does not exhibit apparent signs of heart failure.  Has not taken his Lasix since June.  Overall  functionally he does well. We have started him on Jardiance 10 mg, no beta-blocker due to previous history of pauses, consider initiation of ARB outpatient.  MRA deferred here due to normal upper limits of potassium.  Patient will bring blood pressure log to his follow-up appointment.  Atrial fibrillation/flutter Maintaining normal sinus rhythm.  Has history of PVCs and pauses and not on any AV nodal agent.  Continue Eliquis  CKD Overall looks to be stable.  On admission was 1.46.  Currently 1.44.   CAD status post CABG Hyperlipidemia Asymptomatic without any anginal complaints.  Continue statin.  No aspirin with Eliquis.  COPD exacerbation versus atypical pneumonia Will be discharged on supplemental oxygen however is not on any while in the room.  Follow-up has already been arranged with Jennette Kettle on the 24th.  Repeat labs at that appointment given addition of Jardiance.  For questions or updates, please contact [facility] Please consult [email or web address] for contact info under        Signed, Abagail Kitchens, PA-C

## 2022-11-03 NOTE — Plan of Care (Signed)
  Problem: Safety: Goal: Ability to remain free from injury will improve Outcome: Progressing   Problem: Skin Integrity: Goal: Risk for impaired skin integrity will decrease Outcome: Progressing   Problem: Coping: Goal: Level of anxiety will decrease Outcome: Progressing   Problem: Clinical Measurements: Goal: Ability to maintain clinical measurements within normal limits will improve Outcome: Progressing   Problem: Clinical Measurements: Goal: Respiratory complications will improve Outcome: Progressing

## 2022-11-03 NOTE — TOC Transition Note (Signed)
Transition of Care [facility]) - CM/SW Discharge Note   Patient Details  Name: Marc Gilbert MRN: [id number] Date of Birth: Sep 02, 1930  [facility] CM/SW Contact:  Howell Rucks, RN Phone Number: 11/03/2022, 10:15 AM   Clinical Narrative: DC  to Home order for today, met with pt and his dtr at bedside to answer questions regarding home 02,  3 way call with [employer] rep-Jermaine, per Jermaine family to call [employer] after discharge for home 02 and set and and to colloborate portable 02 with PCP, pt and and dtr verbalized understanding.  Pt/dtr had no further questions. No further TOC needs identified.        Final next level of care: Home/Self Care Barriers to Discharge: Barriers Resolved   Patient Goals and CMS Choice      Discharge Placement                         Discharge Plan and Services Additional resources added to the After Visit Summary for                    DME Agency: [employer] (Home 02) Date DME Agency Contacted: 11/03/22 Time DME Agency Contacted: [phone] Representative spoke with at DME Agency: Vaughan Basta            Social Determinants of Health (SDOH) Interventions SDOH Screenings   Food Insecurity: No Food Insecurity (10/31/2022)  Housing: Low Risk  (10/31/2022)  Transportation Needs: No Transportation Needs (10/31/2022)  Utilities: Not At Risk (10/31/2022)  Tobacco Use: Medium Risk (10/30/2022)     Readmission Risk Interventions    11/02/2022   11:59 AM  Readmission Risk Prevention Plan  Transportation Screening Complete  PCP or Specialist Appt within 3-5 Days Complete  HRI or Home Care Consult Complete  Social Work Consult for Recovery Care Planning/Counseling Complete  Palliative Care Screening Not Applicable  Medication Review [employer]) Complete

## 2022-11-03 NOTE — Progress Notes (Signed)
   11/03/22 0930  Aerosol Therapy Tx  Medications (S)  Other (Comment) (Ventolin 2 puffs, instructions given with spacer to patient and his daughter.Marland Kitchen)

## 2022-11-10 ENCOUNTER — Ambulatory Visit: Payer: Medicare Other | Attending: Nurse Practitioner | Admitting: Nurse Practitioner

## 2022-11-10 ENCOUNTER — Other Ambulatory Visit (INDEPENDENT_AMBULATORY_CARE_PROVIDER_SITE_OTHER): Payer: Medicare Other

## 2022-11-10 ENCOUNTER — Encounter: Payer: Self-pay | Admitting: Nurse Practitioner

## 2022-11-10 VITALS — BP 132/74 | HR 110 | Ht 72.0 in | Wt 200.8 lb

## 2022-11-10 DIAGNOSIS — I4892 Unspecified atrial flutter: Secondary | ICD-10-CM

## 2022-11-10 DIAGNOSIS — Z86718 Personal history of other venous thrombosis and embolism: Secondary | ICD-10-CM | POA: Diagnosis not present

## 2022-11-10 DIAGNOSIS — I10 Essential (primary) hypertension: Secondary | ICD-10-CM | POA: Diagnosis not present

## 2022-11-10 DIAGNOSIS — I455 Other specified heart block: Secondary | ICD-10-CM

## 2022-11-10 DIAGNOSIS — I4819 Other persistent atrial fibrillation: Secondary | ICD-10-CM | POA: Diagnosis not present

## 2022-11-10 DIAGNOSIS — R000 Tachycardia, unspecified: Secondary | ICD-10-CM

## 2022-11-10 DIAGNOSIS — I25118 Atherosclerotic heart disease of native coronary artery with other forms of angina pectoris: Secondary | ICD-10-CM | POA: Diagnosis not present

## 2022-11-10 DIAGNOSIS — I5022 Chronic systolic (congestive) heart failure: Secondary | ICD-10-CM | POA: Diagnosis not present

## 2022-11-10 DIAGNOSIS — E785 Hyperlipidemia, unspecified: Secondary | ICD-10-CM

## 2022-11-10 DIAGNOSIS — I5020 Unspecified systolic (congestive) heart failure: Secondary | ICD-10-CM

## 2022-11-10 DIAGNOSIS — N183 Chronic kidney disease, stage 3 unspecified: Secondary | ICD-10-CM | POA: Diagnosis not present

## 2022-11-10 MED ORDER — EMPAGLIFLOZIN 10 MG PO TABS: 10.0000 mg | ORAL_TABLET | Freq: Every day | ORAL | 3 refills | Status: AC

## 2022-11-10 MED ORDER — CARVEDILOL 3.125 MG PO TABS: 3.1250 mg | ORAL_TABLET | Freq: Two times a day (BID) | ORAL | 3 refills | Status: DC

## 2022-11-10 NOTE — Progress Notes (Unsigned)
Enrolled for [employer] to mail a ZIO AT Live Telemetry monitor to patients address on file.   Dr. Nelly Laurence to read.

## 2022-11-10 NOTE — Progress Notes (Signed)
Office Visit    Patient Name: Marc Gilbert Date of Encounter: 11/10/2022  Primary Care Provider:  Collene Mares, [place] Primary Cardiologist:  Rollene Rotunda, MD  Chief Complaint    89 year old male with a history of CAD s/p CABG x 2 (LIMA-LAD, SVG-OM) in 2018, atrial fibrillation/atrial flutter, HFmrEF, DVT, hypertension, hyperlipidemia, CKD stage III, COPD, prostate cancer, and GERD who presents for hospital follow-up related to heart failure.  Past Medical History    Past Medical History:  Diagnosis Date   Arthritis    "right hand; back" (08/30/2015)   Cancer (HCC)    skin  squamous and basal   Chronic lower back pain    CKD (chronic kidney disease), stage III (HCC)    Stage III   COPD (chronic obstructive pulmonary disease) (HCC)    no home O2   Coronary artery disease    Dysplastic colon polyp age 84   carcinoma in situ   GERD (gastroesophageal reflux disease)    occ   H/O hiatal hernia    Hyperlipidemia    Hypertension    Prostate cancer (HCC) 07/2014   active surveillance, Glisson 6   Past Surgical History:  Procedure Laterality Date   A-FLUTTER ABLATION N/A 08/10/2022   Procedure: A-FLUTTER ABLATION;  Surgeon: Mealor, [name], MD;  Location: [facility] INVASIVE CV LAB;  Service: Cardiovascular;  Laterality: N/A;   CARDIAC CATHETERIZATION     CATARACT EXTRACTION W/ INTRAOCULAR LENS IMPLANT Left 08/27/2015   CORONARY ARTERY BYPASS GRAFT N/A 08/14/2016   Procedure: CORONARY ARTERY BYPASS GRAFTING (CABG)x2, using left internal mammary artery and right greater saphenous vein harvested endoscopically;  Surgeon: Alleen Borne, MD;  Location: [facility] OR;  Service: Open Heart Surgery;  Laterality: N/A;   JOINT REPLACEMENT     LEFT HEART CATH AND CORONARY ANGIOGRAPHY N/A 08/11/2016   Procedure: Left Heart Cath and Coronary Angiography;  Surgeon: Lyn Records, MD;  Location: [facility] INVASIVE CV LAB;  Service: Cardiovascular;  Laterality: N/A;   MOHS SURGERY     multiple SCC    PROSTATE BIOPSY  07/18/2014   TEE WITHOUT CARDIOVERSION N/A 08/14/2016   Procedure: TRANSESOPHAGEAL ECHOCARDIOGRAM (TEE);  Surgeon: Alleen Borne, MD;  Location: [facility] OR;  Service: Open Heart Surgery;  Laterality: N/A;   TONSILLECTOMY     TOTAL HIP ARTHROPLASTY  05/26/2011   Procedure: TOTAL HIP ARTHROPLASTY;  Surgeon: Valeria Batman, MD;  Location: [facility] OR;  Service: Orthopedics;  Laterality: Right;   TOTAL HIP ARTHROPLASTY Left 12/25/2021   Procedure: LEFT TOTAL HIP ARTHROPLASTY ANTERIOR APPROACH;  Surgeon: Kathryne Hitch, MD;  Location: [facility] ORS;  Service: Orthopedics;  Laterality: Left;   TOTAL SHOULDER ARTHROPLASTY Right 06/18/2020   Procedure: TOTAL SHOULDER ARTHROPLASTY;  Surgeon: Teryl Lucy, MD;  Location: [facility] ORS;  Service: Orthopedics;  Laterality: Right;   ULTRASOUND GUIDANCE FOR VASCULAR ACCESS  08/11/2016   Procedure: Ultrasound Guidance For Vascular Access;  Surgeon: Lyn Records, MD;  Location: [facility] INVASIVE CV LAB;  Service: Cardiovascular;;    Allergies  No Known Allergies   Labs/Other Studies Reviewed    The following studies were reviewed today:  Cardiac Studies & Procedures   CARDIAC CATHETERIZATION  CARDIAC CATHETERIZATION 08/11/2016  Narrative  Critical obstruction in the proximal to mid LAD, 95%, within a heavily calcified region.  Angiographically significant calcified distal left main, 60%.  Greater than 90% small first obtuse marginal  30% mid circumflex  Widely patent, dominant, RCA.  Normal left ventricular systolic function with  moderate mid to distal anterior wall hypokinesis. EF 45-50%. Mildly elevated LVEDP consistent with acute diastolic heart failure.  RECOMMENDATIONS:   Given angiographically significant left main, need to discuss treatment options, bypass surgery versus culprit lesion PCI on the LAD lesion (utilizing rotational or orbital atherectomy). Significant other comorbidities may impact treatment decision.  Surgical  consultation has been requested.  Findings Coronary Findings Diagnostic  Dominance: Co-dominant  Left Main  Left Anterior Descending The lesion is eccentric. The lesion is calcified.  First Diagonal Branch Vessel is small in size.  Left Circumflex  First Obtuse Marginal Branch Vessel is small in size.  Intervention  No interventions have been documented.   STRESS TESTS  NM MYOCAR MULTI W/SPECT W 08/31/2015  Narrative CLINICAL DATA:  Chest pain.  EXAM: MYOCARDIAL IMAGING WITH SPECT (REST AND PHARMACOLOGIC-STRESS)  GATED LEFT VENTRICULAR WALL MOTION STUDY  LEFT VENTRICULAR EJECTION FRACTION  TECHNIQUE: Standard myocardial SPECT imaging was performed after resting intravenous injection of 10 mCi Tc-109m tetrofosmin. Subsequently, intravenous infusion of Lexiscan was performed under the supervision of the Cardiology staff. At peak effect of the drug, 30 mCi Tc-16m tetrofosmin was injected intravenously and standard myocardial SPECT imaging was performed. Quantitative gated imaging was also performed to evaluate left ventricular wall motion, and estimate left ventricular ejection fraction.  COMPARISON:  None.  FINDINGS: Perfusion: There is a medium to large size area of mild decreased activity defect involving the inferior apex and inferior wall. No reversibility identified.  Wall Motion: Decreased wall motion involving the mid and distal segments of the inferior wall. There is moderate hypokinesis involving the proximal mid and distal segments of the lateral wall.  Left Ventricular Ejection Fraction: 47 %  End diastolic volume 79 ml  End systolic volume 42 ml  IMPRESSION: 1. No pharmacologically induced. Myocardial reversibility identified.  2. Moderate lateral wall hypokinesis and mild inferior wall hypokinesis.  3. Left ventricular ejection fraction 47%  4. Non invasive risk stratification*: Low  *2012 Appropriate Use Criteria for Coronary  Revascularization Focused Update: J Am Coll Cardiol. 2012;59(9):857-881. http://content.dementiazones.com.aspx?articleid=1201161   Electronically Signed By: Signa Kell M.D. On: 08/31/2015 11:46   ECHOCARDIOGRAM  ECHOCARDIOGRAM COMPLETE 10/31/2022  Narrative ECHOCARDIOGRAM REPORT    Patient Name:   SHOGO SPEARIN Date of Exam: 10/31/2022 Medical Rec #:  [id number]      Height:       72.0 in Accession #:    [id number]     Weight:       207.2 lb Date of Birth:  [birth year]      BSA:          2.163 m Patient Age:    89 years       BP:           107/57 mmHg Patient Gender: M              HR:           83 bpm. Exam Location:  Inpatient  Procedure: 2D Echo, Cardiac Doppler, Color Doppler and Intracardiac Opacification Agent  Indications:    CHF I50.31  History:        Patient has prior history of Echocardiogram examinations, most recent 07/23/2022. CAD, Prior CABG, CKD; H/O DVT; Risk Factors:Hypertension, Dyslipidemia and Former Smoker.  Sonographer:    Dondra Prader RVT RCS Referring Phys: [id number] VISHAL R PATEL   Sonographer Comments: Technically difficult study due to poor echo windows, suboptimal apical window and suboptimal subcostal window. IMPRESSIONS   1. No left ventricular  thrombus is seen (Definity contrast was used). Left ventricular ejection fraction, by estimation, is 45 to 50%. The left ventricle has mildly decreased function. The left ventricle demonstrates global hypokinesis. Left ventricular diastolic parameters are consistent with Grade I diastolic dysfunction (impaired relaxation). 2. Right ventricular systolic function is mildly reduced. The right ventricular size is moderately enlarged. There is mildly elevated pulmonary artery systolic pressure. The estimated right ventricular systolic pressure is 38.0 mmHg. 3. Left atrial size was mild to moderately dilated. 4. Right atrial size was mildly dilated. 5. The mitral valve is normal in structure. No  evidence of mitral valve regurgitation. No evidence of mitral stenosis. 6. The aortic valve is tricuspid. Aortic valve regurgitation is not visualized. No aortic stenosis is present. 7. The inferior vena cava is normal in size with greater than 50% respiratory variability, suggesting right atrial pressure of 3 mmHg.  Comparison(s): No significant change from prior study. Prior images reviewed side by side.  FINDINGS Left Ventricle: No left ventricular thrombus is seen (Definity contrast was used). Left ventricular ejection fraction, by estimation, is 45 to 50%. The left ventricle has mildly decreased function. The left ventricle demonstrates global hypokinesis. The left ventricular internal cavity size was normal in size. There is no left ventricular hypertrophy. Abnormal (paradoxical) septal motion consistent with post-operative status. Left ventricular diastolic parameters are consistent with Grade I diastolic dysfunction (impaired relaxation). Normal left ventricular filling pressure.  Right Ventricle: The right ventricular size is moderately enlarged. No increase in right ventricular wall thickness. Right ventricular systolic function is mildly reduced. There is mildly elevated pulmonary artery systolic pressure. The tricuspid regurgitant velocity is 2.96 m/s, and with an assumed right atrial pressure of 3 mmHg, the estimated right ventricular systolic pressure is 38.0 mmHg.  Left Atrium: Left atrial size was mild to moderately dilated.  Right Atrium: Right atrial size was mildly dilated.  Pericardium: There is no evidence of pericardial effusion.  Mitral Valve: The mitral valve is normal in structure. Mild mitral annular calcification. No evidence of mitral valve regurgitation. No evidence of mitral valve stenosis.  Tricuspid Valve: The tricuspid valve is normal in structure. Tricuspid valve regurgitation is mild.  Aortic Valve: The aortic valve is tricuspid. Aortic valve regurgitation  is not visualized. No aortic stenosis is present. Aortic valve mean gradient measures 2.0 mmHg. Aortic valve peak gradient measures 3.0 mmHg. Aortic valve area, by VTI measures 2.84 cm.  Pulmonic Valve: The pulmonic valve was normal in structure. Pulmonic valve regurgitation is not visualized.  Aorta: The aortic root and ascending aorta are structurally normal, with no evidence of dilitation.  Venous: The inferior vena cava is normal in size with greater than 50% respiratory variability, suggesting right atrial pressure of 3 mmHg.  IAS/Shunts: No atrial level shunt detected by color flow Doppler.   LEFT VENTRICLE PLAX 2D LVIDd:         5.10 cm   Diastology LVIDs:         3.70 cm   LV e' medial:    6.31 cm/s LV PW:         1.20 cm   LV E/e' medial:  8.3 LV IVS:        1.00 cm   LV e' lateral:   7.83 cm/s LVOT diam:     2.20 cm   LV E/e' lateral: 6.7 LV SV:         45 LV SV Index:   21 LVOT Area:     3.80 cm  RIGHT VENTRICLE             IVC RV S prime:     10.70 cm/s  IVC diam: 1.90 cm TAPSE (M-mode): 1.9 cm  LEFT ATRIUM           Index        RIGHT ATRIUM           Index LA diam:      4.30 cm 1.99 cm/m   RA Area:     18.65 cm LA Vol (A4C): 59.7 ml 27.60 ml/m  RA Volume:   51.40 ml  23.76 ml/m AORTIC VALVE                    PULMONIC VALVE AV Area (Vmax):    3.34 cm     PV Vmax:          0.63 m/s AV Area (Vmean):   3.15 cm     PV Peak grad:     1.6 mmHg AV Area (VTI):     2.84 cm     PR End Diast Vel: 7.84 msec AV Vmax:           86.90 cm/s AV Vmean:          57.400 cm/s AV VTI:            0.158 m AV Peak Grad:      3.0 mmHg AV Mean Grad:      2.0 mmHg LVOT Vmax:         76.30 cm/s LVOT Vmean:        47.600 cm/s LVOT VTI:          0.118 m LVOT/AV VTI ratio: 0.75  AORTA Ao Root diam: 3.60 cm Ao Asc diam:  3.60 cm  MITRAL VALVE               TRICUSPID VALVE MV Area (PHT): 3.91 cm    TR Peak grad:   35.0 mmHg MV Decel Time: 194 msec    TR Vmax:        296.00  cm/s MV E velocity: 52.30 cm/s MV A velocity: 81.00 cm/s  SHUNTS MV E/A ratio:  0.65        Systemic VTI:  0.12 m Systemic Diam: 2.20 cm  Thurmon Fair MD Electronically signed by Thurmon Fair MD Signature Date/Time: 10/31/2022/12:30:52 PM    Final   TEE  ECHO TEE 08/14/2016  Interpretation Summary  Septum: No Patent Foramen Ovale present.  Left atrium: Patent foramen ovale not present.  Aortic valve: No stenosis. Trace regurgitation.  Mitral valve: Mild regurgitation.  Left ventricle: LV systolic function is mildly reduced with an EF of 45-50%.   MONITORS  LONG TERM MONITOR (3-14 DAYS) 06/22/2022  Narrative Atrial flutter Predominantly slow ventricular rates Pauses with the longest being six seconds. Ventricular ectopy was noted.          Recent Labs: 08/07/2022: TSH 15.000 10/30/2022: ALT 22; B Natriuretic Peptide 468.8 10/31/2022: Hemoglobin 15.9; Magnesium 2.1; Platelets 201 11/03/2022: BUN 46; Creatinine, Ser 1.44; Potassium 4.4; Sodium 138  Recent Lipid Panel    Component Value Date/Time   CHOL 157 08/10/2016 1302   TRIG 72 08/10/2016 1302   HDL 56 08/10/2016 1302   CHOLHDL 2.8 08/10/2016 1302   VLDL 14 08/10/2016 1302   LDLCALC 87 08/10/2016 1302    History of Present Illness    89 year old male with the above past medical history including CAD s/p CABG x 2 (  LIMA-LAD, SVG-OM) in 2018, atrial fibrillation/atrial flutter, HFmrEF, DVT, hypertension, hyperlipidemia, CKD stage III, COPD, prostate cancer, and GERD.   He underwent CABG x 2 (LIMA-LAD, SVG-OM) in 2018.  Echocardiogram at the time showed EF 55 to 60%, G1 DD, mild RV dilation, moderate RV systolic dysfunction, no significant valvular abnormalities.  He was diagnosed with atrial fibrillation/atrial flutter in 05/2022.  He noted associated fatigue, shortness of breath, bilateral lower extremity edema. He was started on Eliquis, metoprolol, loaded with amiodarone, and was started on Lasix.  3-day ZIO  revealed continuous atrial flutter (100% burden), HR ranging from 30 to 133 bpm, 32 pauses occurred, longest lasting 6.1 seconds.  Echocardiogram in June 2024 showed EF 50 to 55%, low normal LV function, no RWMA, mildly reduced RV systolic function, moderately elevated PASP, no significant valvular abnormalities.  He was referred to EP underwent ablation of atrial flutter on August 10, 2022.  He was seen virtually on 10/30/2022 for preoperative cardiac evaluation and was doing well. Unfortunately, he presented to the ED on 10/30/2022 with sudden onset shortness of breath, fever, cough.  He was treated for bacterial pneumonia, COPD exacerbation.  Repeat echocardiogram showed EF 45 to 50%, mildly decreased LV function, LV global hypokinesis, G1 DD, mildly reduced RV systolic function, no significant valvular abnormalities, no significant change from prior study.  Cardiology was consulted.  Beta-blocker was deferred in the setting of pauses.  He was continued on SGLT2.  Initiation of ARB was deferred in the setting of borderline low BP.  He was discharged home in stable condition on 11/03/2022.  He presents today for follow-up accompanied by his wife and daughter.  Since his last visit and since his recent hospitalization he has been stable from a cardiac standpoint.  He continues to note dyspnea on exertion and is still requiring home oxygen.  His oxygen saturation has been approximately 92% on 2L, though he has noticed desaturation into the 70s with activity.  He denies worsening shortness of breath, edema, PND, orthopnea, weight gain.  His heart rate has been elevated in the low 100s, he denies any significant palpitations.   Home Medications    Current Outpatient Medications  Medication Sig Dispense Refill   acetaminophen (TYLENOL) 500 MG tablet Take 1,000 mg by mouth every 6 (six) hours as needed for mild pain or moderate pain (for pain).     apixaban (ELIQUIS) 5 MG TABS tablet Take 1 tablet (5 mg total) by  mouth 2 (two) times daily. 60 tablet 6   atorvastatin (LIPITOR) 40 MG tablet Take 1 tablet (40 mg total) by mouth daily. (Patient taking differently: Take 40 mg by mouth at bedtime.) 90 tablet 3   carvedilol (COREG) 3.125 MG tablet Take 1 tablet (3.125 mg total) by mouth 2 (two) times daily. 180 tablet 3   diclofenac Sodium (VOLTAREN) 1 % GEL Apply 1 application  topically See admin instructions. Apply 2-4 grams of gel topically to both knees once a day     docusate sodium (COLACE) 250 MG capsule Take 250 mg by mouth daily.     gabapentin (NEURONTIN) 300 MG capsule Take 300-600 mg by mouth See admin instructions. Take 300 mg by mouth between 3 AM-4 AM, 600 mg by mouth after breakfast, 600 mg between 3 PM-4 PM, and 300 mg at bedtime     Glycerin-Hypromellose-PEG 400 (DRY EYE RELIEF DROPS) 0.2-0.2-1 % SOLN Place 1 drop into both eyes 3 (three) times daily as needed (for dryness).     Melatonin 10  MG TABS Take 10 mg by mouth at bedtime.     Multiple Vitamins-Minerals (ONE-A-DAY MENS 50+ ADVANTAGE) TABS Take 1 tablet by mouth daily with breakfast.     Omega-3 Fatty Acids (FISH OIL OMEGA-3 PO) Take 1,960 mg by mouth daily.     ondansetron (ZOFRAN) 4 MG tablet Take 1 tablet (4 mg total) by mouth every 8 (eight) hours as needed for nausea or vomiting. 20 tablet 0   oxybutynin (DITROPAN) 5 MG tablet Take 5 mg by mouth at bedtime.     polyethylene glycol (MIRALAX / GLYCOLAX) 17 g packet Take 8.5 g by mouth daily as needed for mild constipation or moderate constipation.     predniSONE (DELTASONE) 10 MG tablet Takes 2 tabs for 1 days, then 1 tab for 1 days, and then stop. 3 tablet 0   sodium chloride (OCEAN) 0.65 % SOLN nasal spray Place 1 spray into both nostrils as needed for congestion.     Tamsulosin HCl (FLOMAX) 0.4 MG CAPS Take 0.4 mg by mouth at bedtime.     testosterone cypionate (DEPOTESTOSTERONE CYPIONATE) 200 MG/ML injection Inject 66.67 mg into the skin every Wednesday.     empagliflozin  (JARDIANCE) 10 MG TABS tablet Take 1 tablet (10 mg total) by mouth daily. 90 tablet 3   fluticasone (FLONASE) 50 MCG/ACT nasal spray Place 1-2 sprays into both nostrils in the morning and at bedtime.     furosemide (LASIX) 20 MG tablet Take 1 tablet (20 mg total) by mouth daily for 10 days. (Patient taking differently: Take 20 mg by mouth daily as needed for fluid or edema.) 30 tablet 0   No current facility-administered medications for this visit.     Review of Systems    He denies chest pain, palpitations, pnd, orthopnea, n, v, dizziness, syncope, edema, weight gain, or early satiety. All other systems reviewed and are otherwise negative except as noted above.   Physical Exam    VS:  BP 132/74   Pulse (!) 110   Ht 6' (1.829 m)   Wt 200 lb 12.8 oz (91.1 kg)   SpO2 93%   BMI 27.23 kg/m  GEN: Well nourished, well developed, in no acute distress. HEENT: normal. Neck: Supple, no JVD, carotid bruits, or masses. Cardiac: RRR, no murmurs, rubs, or gallops. No clubbing, cyanosis, edema.  Radials/DP/PT 2+ and equal bilaterally.  Respiratory:  Respirations regular and unlabored, clear to auscultation bilaterally. GI: Soft, nontender, nondistended, BS + x 4. MS: no deformity or atrophy. Skin: warm and dry, no rash. Neuro:  Strength and sensation are intact. Psych: Normal affect.  Accessory Clinical Findings    ECG personally reviewed by me today - EKG Interpretation Date/Time:  Tuesday November 10 2022 09:48:00 EDT Ventricular Rate:  110 PR Interval:  174 QRS Duration:  128 QT Interval:  342 QTC Calculation: 462 R Axis:   -44  Text Interpretation: Sinus tachycardia Left axis deviation Right bundle branch block When compared with ECG of 02-Nov-2022 10:18, Premature ventricular complexes are no longer Present QRS axis Shifted left Confirmed by Bernadene Person ([id number]) on 11/10/2022 12:58:54 PM  - no acute changes.   Lab Results  Component Value Date   WBC 4.6 10/31/2022   HGB 15.9  10/31/2022   HCT 50.1 10/31/2022   MCV 96.2 10/31/2022   PLT 201 10/31/2022   Lab Results  Component Value Date   CREATININE 1.44 (H) 11/03/2022   BUN 46 (H) 11/03/2022   NA 138 11/03/2022   K 4.4  11/03/2022   CL 106 11/03/2022   CO2 24 11/03/2022   Lab Results  Component Value Date   ALT 22 10/30/2022   AST 27 10/30/2022   ALKPHOS 68 10/30/2022   BILITOT 1.1 10/30/2022   Lab Results  Component Value Date   CHOL 157 08/10/2016   HDL 56 08/10/2016   LDLCALC 87 08/10/2016   TRIG 72 08/10/2016   CHOLHDL 2.8 08/10/2016    Lab Results  Component Value Date   HGBA1C 5.7 (H) 08/10/2016    Assessment & Plan    1. HFmrEF/pulmonary hypertension: Recent echo showed EF 45 to 50%, mildly decreased LV function, LV global hypokinesis, G1 DD, mildly reduced RV systolic function, no significant valvular abnormalities, no significant change from prior study.  He still requiring home oxygen, his oxygen saturation has been in the low 90s on 2 L at rest, he has noticed desaturation with SpO2 as low as in the 70s with activity.  Euvolemic and well compensated on exam.  He has not required any as needed Lasix.  Continue to monitor for worsening shortness of breath.  Will attempt better heart rate control as below to see if this improves his oxygen demands.  Follow-up with PCP.  If oxygen requirements increase or persist, consider follow-up with pulmonology.  Will trial carvedilol as below.  Continue Jardiance. Will repeat BMET today.   2. Persistent atrial fibrillation/atrial flutter/history of pauses: Prior 3-day ZIO in the setting of atrial fibrillation/atrial flutter revealed continuous atrial flutter (100% burden), HR ranging from 30 to 133 bpm, 32 pauses occurred, longest lasting 6.1 seconds. S/p ablation in 07/2022.  EKG today concerning for atrial flutter, reviewed with Dr. Tresa Endo who determined EKG reflects sinus tachycardia.  Beta-blocker was previously discontinued in the setting of sinus  pause as he was previously on metoprolol succinate 50 mg daily).  Discussed with Dr. Tresa Endo, DOD.  Given ongoing tachycardia, risk for decompensation, will trial low-dose carvedilol 3.125 mg twice daily.  Will place 7-day live ZIO to reassess heart rate, screen for recurrent atrial fibrillation/atrial flutter, and for bradycardia/pauses.  Recommend follow-up with EP. Continue Eliquis.    3. CAD: S/p CABG x 2 (LIMA-LAD, SVG-OM) in 2018. Stable with no anginal symptoms. No indication for ischemic evaluation.  No aspirin in the setting of Eliquis.  Continue Lipitor.   4. History of DVT: Resolved, on Eliquis as above.   5. Hypertension: BP well controlled. Continue current antihypertensive regimen.    6. Hyperlipidemia: LDL was 74 in 08/2021.  Continue Lipitor.   7. CKD stage III: Creatinine was stable at 1.44 in 10/2022.  Will update BMET today in the setting of new Jardiance.   8. Disposition: Follow-up ASAP with EP, follow-up in 6 with GEN cards APP if unable to see EP, follow-up as scheduled with Dr. Antoine Poche in 01/2023.           Joylene Grapes, NP 11/10/2022, 12:59 PM

## 2022-11-10 NOTE — Patient Instructions (Addendum)
Medication Instructions:  Start Carvedilol 3.125 mg 1 tablet twice a day. *If you need a refill on your cardiac medications before your next appointment, please call your pharmacy*   Lab Work: Bmet will be drawn today If you have labs (blood work) drawn today and your tests are completely normal, you will receive your results only by: MyChart Message (if you have MyChart) OR A paper copy in the mail If you have any lab test that is abnormal or we need to change your treatment, we will call you to review the results.   Testing/Procedures:  ZIO AT Long term monitor-Live Telemetry  Your physician has requested you wear a ZIO patch monitor for 7 days.  This is a single patch monitor. [employer] supplies one patch monitor per enrollment. Additional  stickers are not available.  Please do not apply patch if you will be having a Nuclear Stress Test, Echocardiogram, Cardiac CT, MRI,  or Chest Xray during the period you would be wearing the monitor. The patch cannot be worn during  these tests. You cannot remove and re-apply the ZIO AT patch monitor.  Your ZIO patch monitor will be mailed 3 day USPS to your address on file. It may take 3-5 days to  receive your monitor after you have been enrolled.  Once you have received your monitor, please review the enclosed instructions. Your monitor has  already been registered assigning a specific monitor serial # to you.   Billing and Patient Assistance Program information  Meredeth Ide has been supplied with any insurance information on record for billing. [employer] offers a sliding scale Patient Assistance Program for patients without insurance, or whose  insurance does not completely cover the cost of the ZIO patch monitor. You must apply for the  Patient Assistance Program to qualify for the discounted rate. To apply, call [employer] at [phone],  select option 4, select option 2 , ask to apply for the Patient Assistance Program, (you can request an   interpreter if needed). [employer] will ask your household income and how many people are in your  household. [employer] will quote your out-of-pocket cost based on this information. They will also be able  to set up a 12 month interest free payment plan if needed.  Applying the monitor   Shave hair from upper left chest.  Hold the abrader disc by orange tab. Rub the abrader in 40 strokes over left upper chest as indicated in  your monitor instructions.  Clean area with 4 enclosed alcohol pads. Use all pads to ensure the area is cleaned thoroughly. Let  dry.  Apply patch as indicated in monitor instructions. Patch will be placed under collarbone on left side of  chest with arrow pointing upward.  Rub patch adhesive wings for 2 minutes. Remove the white label marked "1". Remove the white label  marked "2". Rub patch adhesive wings for 2 additional minutes.  While looking in a mirror, press and release button in center of patch. A small green light will flash 3-4  times. This will be your only indicator that the monitor has been turned on.  Do not shower for the first 24 hours. You may shower after the first 24 hours.  Press the button if you feel a symptom. You will hear a small click. Record Date, Time and Symptom in  the Patient Log.   Starting the Gateway  In your kit there is a Audiological scientist box the size of a cellphone. This is Buyer, retail. It  transmits all your  recorded data to [employer]. This box must always stay within 10 feet of you. Open the box and push the *  button. There will be a light that blinks orange and then green a few times. When the light stops  blinking, the Gateway is connected to the ZIO patch. Call [employer] at [phone] to confirm your monitor is transmitting.  Returning your monitor  Remove your patch and place it inside the Gateway. In the lower half of the Gateway there is a white  bag with prepaid postage on it. Place Gateway in bag and seal. Mail package  back to [place] as soon as  possible. Your physician should have your final report approximately 7 days after you have mailed back  your monitor. Call [facility] Customer Care at [phone] if you have questions regarding your ZIO AT  patch monitor. Call them immediately if you see an orange light blinking on your monitor.  If your monitor falls off in less than 4 days, contact our Monitor department at [phone]. If your  monitor becomes loose or falls off after 4 days call [employer] at [phone] for suggestions on  securing your monitor    Follow-Up: At [facility], you and your health needs are our priority.  As part of our continuing mission to provide you with exceptional heart care, we have created designated Provider Care Teams.  These Care Teams include your primary Cardiologist (physician) and Advanced Practice Providers (APPs -  Physician Assistants and Nurse Practitioners) who all work together to provide you with the care you need, when you need it.  We recommend signing up for the patient portal called "MyChart".  Sign up information is provided on this After Visit Summary.  MyChart is used to connect with patients for Virtual Visits (Telemedicine).  Patients are able to view lab/test results, encounter notes, upcoming appointments, etc.  Non-urgent messages can be sent to your provider as well.   To learn more about what you can do with MyChart, go to [email or web address].    Your next appointment:   F/u with Dr. Nelly Laurence as soon possible or 6 weeks with Irving Burton if patient  is unable to get timely appointment with Dr. Nelly Laurence  Provider:   Keep December 5th with Dr [place] Lions @ 12:20 PM

## 2022-11-11 LAB — BASIC METABOLIC PANEL
BUN/Creatinine Ratio: 18 (ref 10–24)
BUN: 24 mg/dL (ref 10–36)
CO2: 24 mmol/L (ref 20–29)
Calcium: 9.4 mg/dL (ref 8.6–10.2)
Chloride: 100 mmol/L (ref 96–106)
Creatinine, Ser: 1.37 mg/dL — ABNORMAL HIGH (ref 0.76–1.27)
Glucose: 112 mg/dL — ABNORMAL HIGH (ref 70–99)
Potassium: 5.3 mmol/L — ABNORMAL HIGH (ref 3.5–5.2)
Sodium: 139 mmol/L (ref 134–144)
eGFR: 49 mL/min/{1.73_m2} — ABNORMAL LOW (ref >59)

## 2022-11-12 ENCOUNTER — Ambulatory Visit: Payer: Medicare Other | Admitting: Cardiovascular Disease

## 2022-11-13 ENCOUNTER — Other Ambulatory Visit: Payer: Self-pay

## 2022-11-13 DIAGNOSIS — I455 Other specified heart block: Secondary | ICD-10-CM | POA: Diagnosis not present

## 2022-11-13 DIAGNOSIS — R000 Tachycardia, unspecified: Secondary | ICD-10-CM | POA: Diagnosis not present

## 2022-11-13 DIAGNOSIS — I4892 Unspecified atrial flutter: Secondary | ICD-10-CM | POA: Diagnosis not present

## 2022-11-13 DIAGNOSIS — E875 Hyperkalemia: Secondary | ICD-10-CM

## 2022-11-18 ENCOUNTER — Ambulatory Visit (HOSPITAL_BASED_OUTPATIENT_CLINIC_OR_DEPARTMENT_OTHER): Admission: RE | Admit: 2022-11-18 | Payer: Medicare Other | Source: Home / Self Care | Admitting: Plastic Surgery

## 2022-11-18 ENCOUNTER — Encounter (HOSPITAL_BASED_OUTPATIENT_CLINIC_OR_DEPARTMENT_OTHER): Admission: RE | Payer: Self-pay | Source: Home / Self Care

## 2022-11-18 SURGERY — BLEPHAROPLASTY
Anesthesia: Choice | Site: Eye | Laterality: Bilateral

## 2022-11-19 DIAGNOSIS — E875 Hyperkalemia: Secondary | ICD-10-CM | POA: Diagnosis not present

## 2022-11-19 DIAGNOSIS — Z23 Encounter for immunization: Secondary | ICD-10-CM | POA: Diagnosis not present

## 2022-11-21 DIAGNOSIS — R000 Tachycardia, unspecified: Secondary | ICD-10-CM | POA: Diagnosis not present

## 2022-11-21 DIAGNOSIS — I4892 Unspecified atrial flutter: Secondary | ICD-10-CM | POA: Diagnosis not present

## 2022-11-23 LAB — BASIC METABOLIC PANEL: EGFR: 51

## 2022-11-24 ENCOUNTER — Encounter: Payer: Self-pay | Admitting: Cardiovascular Disease

## 2022-11-24 ENCOUNTER — Ambulatory Visit: Payer: Medicare Other | Attending: Cardiovascular Disease | Admitting: Cardiovascular Disease

## 2022-11-24 VITALS — BP 104/68 | HR 87 | Ht 72.0 in | Wt 197.5 lb

## 2022-11-24 DIAGNOSIS — I4892 Unspecified atrial flutter: Secondary | ICD-10-CM | POA: Diagnosis not present

## 2022-11-24 DIAGNOSIS — I455 Other specified heart block: Secondary | ICD-10-CM | POA: Diagnosis not present

## 2022-11-24 DIAGNOSIS — R000 Tachycardia, unspecified: Secondary | ICD-10-CM

## 2022-11-24 DIAGNOSIS — I4819 Other persistent atrial fibrillation: Secondary | ICD-10-CM | POA: Diagnosis not present

## 2022-11-24 NOTE — Patient Instructions (Addendum)
Medication Instructions:  Your physician recommends that you continue on your current medications as directed. Please refer to the Current Medication list given to you today. *If you need a refill on your cardiac medications before your next appointment, please call your pharmacy*   Follow-Up: At [facility], you and your health needs are our priority.  As part of our continuing mission to provide you with exceptional heart care, we have created designated Provider Care Teams.  These Care Teams include your primary Cardiologist (physician) and Advanced Practice Providers (APPs -  Physician Assistants and Nurse Practitioners) who all work together to provide you with the care you need, when you need it.  We recommend signing up for the patient portal called "MyChart".  Sign up information is provided on this After Visit Summary.  MyChart is used to connect with patients for Virtual Visits (Telemedicine).  Patients are able to view lab/test results, encounter notes, upcoming appointments, etc.  Non-urgent messages can be sent to your provider as well.   To learn more about what you can do with MyChart, go to [email or web address].    Your next appointment:   1 year(s)  Provider:   York Pellant, MD   Other Instructions Referral placed for Dr R. Vassie Loll - pulmonologist

## 2022-11-24 NOTE — Progress Notes (Signed)
Electrophysiology Office Note:    Date:  11/24/2022   ID:  Marc Gilbert, DOB 1930/10/21, MRN [id number]  PCP:  Collene Mares, PA   [facility] Providers Cardiologist:  Rollene Rotunda, MD Electrophysiologist:  Maurice Small, MD     Referring MD: Hyacinth Meeker, [place], [place]   History of Present Illness:    Marc Gilbert is a 89 y.o. male with a hx listed below, significant for CAD status post CABG 2018, persistent atrial fibrillation and flutter, hypertension, hyperkalemia, CKD 3, prostate cancer, COPD referred for arrhythmia management.  I reviewed notes by Drs. Hochrein and Corky Sing, NP.  He saw Dr. Herbie Baltimore in clinic on June 15, 2022.  At that time he presented with Apple Watch notifications for atrial fibrillation. According to Dr. Elissa Hefty note, the EKG showed atrial flutter, and review of strips showed only atrial flutter, and the conclusion was that the patient likely had only flutter --not fibrillation.  He was started on amiodarone and a Zio patch placed to assess the flutter chronicity.  The patch showed the patient was persistently in flutter for the duration of where, 48 hours.  The patch also showed multiple pauses, up to 6.1 seconds.  There were multiple symptomatic events associated with PVCs, but no symptoms associated with the pauses.  He underwent ablation of typical atrial flutter on August 10, 2022.  Following the ablation, he felt much better in sinus rhythm.   He was admitted in September 2024 with acute hypoxia due to COPD exacerbation and/or atypical pneumonia.  He was discharged on home oxygen.  He was seen in follow-up in cardiology clinic.  He was slightly tachycardic at that time though the EKG showed sinus rhythm.  A monitor was placed and results are still pending.  A small dose of carvedilol was started, and his heart rates have improved, are typically less than 100 bpm now.  Today, he is hypoxic in clinic, oxygen saturation 77.  He is  in normal sinus rhythm.      EKGs/Labs/Other Studies Reviewed Today:    Echocardiogram:  TTE 10/31/2022 EF 45 to 50%.  Grade 1 diastolic dysfunction.  Left atrium mildly to moderately dilated.    Monitors:  Zio 48hr 05/2022 - my interpretation 100% atrial flutter, V-rates 30-133, avg 73 32 pauses occurred, the longest was 6.1 seconds at about 3:30 PM. Pauses appeared to occur either in the early morning or later afternoon.  Stress testing:   Advanced imaging:   Cardiac catherization   EKG:  Last EKG results: today - typical flutter with controlled ventricular rates   Recent Labs: 08/07/2022: TSH 15.000 10/30/2022: ALT 22; B Natriuretic Peptide 468.8 10/31/2022: Hemoglobin 15.9; Magnesium 2.1; Platelets 201 11/10/2022: BUN 24; Creatinine, Ser 1.37; Potassium 5.3; Sodium 139     Physical Exam:    VS:  BP 104/68 (BP Location: Left Arm, Patient Position: Sitting, Cuff Size: Large)   Pulse 87   Ht 6' (1.829 m)   Wt 197 lb 8 oz (89.6 kg)   SpO2 90%   BMI 26.79 kg/m     Wt Readings from Last 3 Encounters:  11/24/22 197 lb 8 oz (89.6 kg)  11/10/22 200 lb 12.8 oz (91.1 kg)  11/03/22 208 lb 5.4 oz (94.5 kg)     GEN:  Well nourished, well developed in no acute distress CARDIAC: iRRR, no murmurs, rubs, gallops RESPIRATORY:  Normal work of breathing; on supplementary O2. Prominent bilateral rales MUSCULOSKELETAL: no edema  ASSESSMENT & PLAN:    Atrial flutter Maintaining sinus rhythm after flutter ablation and feeling much better Amiodarone was discontinued No evidence of recurrence of arrhythmia.  ZIO monitor is still pending  Pauses These were pauses in the AV node that occurred during flutter, were asymptomatic I suspect these occurred during sleep  Currently, there is no indication for pacemaker  Secondary hypercoagulable state Continue apixaban 5 He is monitoring with apple watch. We may consider discontinuing eliquis in follow-up  Acute on chronic  CHFpEF Diastolic dysfunction with loss of AV synchrony Feeling much better in sinus rhythm  Hypoxia Hypoxic today in normal sinus rhythm. Family is requesting referral to pulmonology, which I think is appropriate           Medication Adjustments/Labs and Tests Ordered: Current medicines are reviewed at length with the patient today.  Concerns regarding medicines are outlined above.  Orders Placed This Encounter  Procedures   EKG 12-Lead   EKG 12-Lead   No orders of the defined types were placed in this encounter.    Signed, Maurice Small, MD  11/24/2022 1:13 PM    [facility]

## 2022-11-27 ENCOUNTER — Encounter: Payer: Medicare Other | Admitting: Plastic Surgery

## 2022-11-27 ENCOUNTER — Encounter (HOSPITAL_BASED_OUTPATIENT_CLINIC_OR_DEPARTMENT_OTHER): Payer: Self-pay

## 2022-11-27 ENCOUNTER — Ambulatory Visit: Payer: Medicare Other | Admitting: Cardiovascular Disease

## 2022-11-30 DIAGNOSIS — J9611 Chronic respiratory failure with hypoxia: Secondary | ICD-10-CM | POA: Diagnosis not present

## 2022-11-30 DIAGNOSIS — J449 Chronic obstructive pulmonary disease, unspecified: Secondary | ICD-10-CM | POA: Diagnosis not present

## 2022-12-03 DIAGNOSIS — M25511 Pain in right shoulder: Secondary | ICD-10-CM | POA: Diagnosis not present

## 2022-12-03 DIAGNOSIS — Z8546 Personal history of malignant neoplasm of prostate: Secondary | ICD-10-CM | POA: Diagnosis not present

## 2022-12-03 DIAGNOSIS — M6281 Muscle weakness (generalized): Secondary | ICD-10-CM | POA: Diagnosis not present

## 2022-12-07 ENCOUNTER — Encounter: Payer: Medicare Other | Admitting: Student

## 2022-12-07 ENCOUNTER — Telehealth: Payer: Self-pay | Admitting: Pulmonary Disease

## 2022-12-07 NOTE — Telephone Encounter (Signed)
Contacted by family member, daughter.  Requesting shorter interval follow-up compared to appointment in December.  I am okay to double book with me at a 1 PM or 8:30 AM time slot sometime in the next 1 to 3 weeks.  Thank you.

## 2022-12-09 NOTE — Telephone Encounter (Signed)
He has never been seen in clinic so there is no "switch." I am trying to help get him in sooner at family request.

## 2022-12-14 DIAGNOSIS — N5201 Erectile dysfunction due to arterial insufficiency: Secondary | ICD-10-CM | POA: Diagnosis not present

## 2022-12-14 DIAGNOSIS — E349 Endocrine disorder, unspecified: Secondary | ICD-10-CM | POA: Diagnosis not present

## 2022-12-14 DIAGNOSIS — Z8546 Personal history of malignant neoplasm of prostate: Secondary | ICD-10-CM | POA: Diagnosis not present

## 2022-12-15 DIAGNOSIS — M79674 Pain in right toe(s): Secondary | ICD-10-CM | POA: Diagnosis not present

## 2022-12-15 DIAGNOSIS — M79675 Pain in left toe(s): Secondary | ICD-10-CM | POA: Diagnosis not present

## 2022-12-15 DIAGNOSIS — B351 Tinea unguium: Secondary | ICD-10-CM | POA: Diagnosis not present

## 2022-12-25 DIAGNOSIS — E039 Hypothyroidism, unspecified: Secondary | ICD-10-CM | POA: Diagnosis not present

## 2022-12-25 DIAGNOSIS — N1831 Chronic kidney disease, stage 3a: Secondary | ICD-10-CM | POA: Diagnosis not present

## 2022-12-25 DIAGNOSIS — F5101 Primary insomnia: Secondary | ICD-10-CM | POA: Diagnosis not present

## 2022-12-25 DIAGNOSIS — I484 Atypical atrial flutter: Secondary | ICD-10-CM | POA: Diagnosis not present

## 2022-12-25 DIAGNOSIS — C61 Malignant neoplasm of prostate: Secondary | ICD-10-CM | POA: Diagnosis not present

## 2022-12-25 DIAGNOSIS — H903 Sensorineural hearing loss, bilateral: Secondary | ICD-10-CM | POA: Diagnosis not present

## 2022-12-25 DIAGNOSIS — E7800 Pure hypercholesterolemia, unspecified: Secondary | ICD-10-CM | POA: Diagnosis not present

## 2022-12-25 DIAGNOSIS — Z0000 Encounter for general adult medical examination without abnormal findings: Secondary | ICD-10-CM | POA: Diagnosis not present

## 2022-12-25 DIAGNOSIS — Z131 Encounter for screening for diabetes mellitus: Secondary | ICD-10-CM | POA: Diagnosis not present

## 2022-12-25 DIAGNOSIS — J449 Chronic obstructive pulmonary disease, unspecified: Secondary | ICD-10-CM | POA: Diagnosis not present

## 2022-12-25 DIAGNOSIS — Z77090 Contact with and (suspected) exposure to asbestos: Secondary | ICD-10-CM | POA: Diagnosis not present

## 2022-12-25 DIAGNOSIS — K5901 Slow transit constipation: Secondary | ICD-10-CM | POA: Diagnosis not present

## 2022-12-25 DIAGNOSIS — G8929 Other chronic pain: Secondary | ICD-10-CM | POA: Diagnosis not present

## 2022-12-25 DIAGNOSIS — Z1331 Encounter for screening for depression: Secondary | ICD-10-CM | POA: Diagnosis not present

## 2022-12-25 DIAGNOSIS — I129 Hypertensive chronic kidney disease with stage 1 through stage 4 chronic kidney disease, or unspecified chronic kidney disease: Secondary | ICD-10-CM | POA: Diagnosis not present

## 2022-12-25 DIAGNOSIS — I700 Atherosclerosis of aorta: Secondary | ICD-10-CM | POA: Diagnosis not present

## 2022-12-28 ENCOUNTER — Ambulatory Visit (INDEPENDENT_AMBULATORY_CARE_PROVIDER_SITE_OTHER): Payer: Medicare Other | Admitting: Physician Assistant

## 2022-12-28 ENCOUNTER — Encounter: Payer: Self-pay | Admitting: Physician Assistant

## 2022-12-28 ENCOUNTER — Other Ambulatory Visit (INDEPENDENT_AMBULATORY_CARE_PROVIDER_SITE_OTHER): Payer: Self-pay

## 2022-12-28 DIAGNOSIS — Z96642 Presence of left artificial hip joint: Secondary | ICD-10-CM

## 2022-12-28 NOTE — Progress Notes (Signed)
HPI: Mr. Franky Macho returns today 1 year status post left total hip arthroplasty.  He states left hip overall is doing well.  States he still has some occasional numbness about the hip incision area but otherwise is doing well.  Unfortunately due to pneumonia is recently placed on oxygen.  States he sees his pulmonologist and about a month.  Review of systems: See HPI otherwise negative  Physical exam: General Well-developed well-nourished male no acute distress.  Does ambulate with a rollator and has oxygen via nasal cannula. Left hip: Good range of motion left hip without pain actively he is able to cross his left leg over the right.  Calf supple nontender on the left dorsiflexion plantarflexion left ankle intact.  Radiographs: AP pelvis lateral view left hip: Bilateral hips well located.  Status post bilateral total hip arthroplasties.  Left total hip arthroplasty components well-seated.  No acute fracture or acute findings.  Impression: 1 year status post left total hip arthroplasty  Plan: He will follow-up with [place] as needed.  Questions encouraged and answered at length.

## 2022-12-29 ENCOUNTER — Other Ambulatory Visit: Payer: Self-pay | Admitting: Nurse Practitioner

## 2022-12-29 MED ORDER — CARVEDILOL 3.125 MG PO TABS: 3.1250 mg | ORAL_TABLET | Freq: Two times a day (BID) | ORAL | 3 refills | Status: DC

## 2023-01-18 NOTE — Progress Notes (Unsigned)
Cardiology Office Note:   Date:  01/21/2023  ID:  Marc Gilbert, DOB [birth year], MRN [id number] PCP: Collene Mares, PA  [name] Cardiologist:  Rollene Rotunda, MD Electrophysiologist:  Maurice Small, MD {  History of Present Illness:   Marc Gilbert is a 89 y.o. male who presents for follow up of CAD/CABG.    He had CABG 08/14/2016 w/ LIMA-LAD and SVG-OM.  He was recently found to have atrial flutter.  He had ablation .  Since I saw him he has been treated for hypothyroidism.  He was in the hospital in September with volume overload and possibly an atypical pneumonia.  He was sent home on oxygen which she was not wearing all the time but his saturations were going to the 70s and now he is wearing it continuously.  He feels better.  He is getting around with a walker.  He did have hip surgery.  He is not having any new shortness of breath, PND or orthopnea.  He is not having any new chest pressure, neck or arm discomfort.  He said no weight gain or edema.   ROS: As stated in the HPI and negative for all other systems.  Studies Reviewed:    EKG:   NA  Risk Assessment/Calculations:    CHA2DS2-VASc Score = 5   This indicates a 7.2% annual risk of stroke. The patient's score is based upon: CHF History: 1 HTN History: 1 Diabetes History: 0 Stroke History: 0 Vascular Disease History: 1 Age Score: 2 Gender Score: 0   Physical Exam:   VS:  BP 114/74   Pulse 86   Ht 6' (1.829 m)   Wt 196 lb (88.9 kg)   SpO2 94% Comment: on 2L  BMI 26.58 kg/m    Wt Readings from Last 3 Encounters:  01/21/23 196 lb (88.9 kg)  11/24/22 197 lb 8 oz (89.6 kg)  11/10/22 200 lb 12.8 oz (91.1 kg)     GEN: Well nourished, well developed in no acute distress NECK: No JVD; No carotid bruits CARDIAC: RRR, no murmurs, rubs, gallops RESPIRATORY:  Clear to auscultation without rales, wheezing or rhonchi  ABDOMEN: Soft, non-tender, non-distended EXTREMITIES:  No edema; No  deformity   ASSESSMENT AND PLAN:   CAD:  The patient has no new sypmtoms.  No further cardiovascular testing is indicated.  We will continue with aggressive risk reduction and meds as listed.   HTN: The blood pressure is at target.  No change in therapy.   DYSLIPIDEMIA:    LDL was 74 with an HDL of 70.  No change in therapy.    ATRIAL FLUTTER: He seems to be maintaining sinus rhythm after flutter ablation.  He has been taken off his amiodarone.  He did have some tachycardia.  Tachycardia but not flutter.  No change in therapy.  He remains on anticoagulation.   ELEVATED TSH:   T3 and T4 were low.  I sent him to his PCP.  He is not being treated with Synthroid.  EDEMA: This is improved.  He takes his Lasix only as needed.  HFmrEF: He seems to be euvolemic.  He is watching his salt.  He has been started on Jardiance as part of his hospitalization tolerating this.  No change in therapy.   CKD:   Creatinine was 1.37 on last check.  No change in therapy.   PULM HTN: We are going to manage his conservatively with volume management.  He is to  see pulmonary.  HYPOXEMIA: He has established now a new patient appointment with pulmonary.  He remains on his chronic O2.       Follow up with Bernadene Person NP in 4 months.   Signed, Rollene Rotunda, MD

## 2023-01-21 ENCOUNTER — Encounter: Payer: Self-pay | Admitting: Cardiology

## 2023-01-21 ENCOUNTER — Ambulatory Visit: Payer: Medicare Other | Attending: Cardiology | Admitting: Cardiology

## 2023-01-21 VITALS — BP 114/74 | HR 86 | Ht 72.0 in | Wt 196.0 lb

## 2023-01-21 DIAGNOSIS — I25118 Atherosclerotic heart disease of native coronary artery with other forms of angina pectoris: Secondary | ICD-10-CM | POA: Insufficient documentation

## 2023-01-21 DIAGNOSIS — I10 Essential (primary) hypertension: Secondary | ICD-10-CM | POA: Diagnosis not present

## 2023-01-21 DIAGNOSIS — I4892 Unspecified atrial flutter: Secondary | ICD-10-CM | POA: Diagnosis not present

## 2023-01-21 MED ORDER — APIXABAN 5 MG PO TABS: 5.0000 mg | ORAL_TABLET | Freq: Two times a day (BID) | ORAL | 6 refills | Status: DC

## 2023-01-21 MED ORDER — ATORVASTATIN CALCIUM 40 MG PO TABS: 40.0000 mg | ORAL_TABLET | Freq: Every day | ORAL | 3 refills | Status: DC

## 2023-01-21 MED ORDER — CARVEDILOL 3.125 MG PO TABS: 3.1250 mg | ORAL_TABLET | Freq: Two times a day (BID) | ORAL | 3 refills | Status: DC

## 2023-01-21 NOTE — Patient Instructions (Addendum)
Medication Instructions:  No changes. [facility]ills [facility]. *If you need a refill on your cardiac medications before your next appointment, please call your pharmacy*   Follow-Up: At [facility], you and your health needs are our priority.  As part of our continuing mission to provide you with exceptional heart care, we have created designated Provider Care Teams.  These Care Teams include your primary Cardiologist (physician) and Advanced Practice Providers (APPs -  Physician Assistants and Nurse Practitioners) who all work together to provide you with the care you need, when you need it.  We recommend signing up for the patient portal called "MyChart".  Sign up information is provided on this After Visit Summary.  MyChart is used to connect with patients for Virtual Visits (Telemedicine).  Patients are able to view lab/test results, encounter notes, upcoming appointments, etc.  Non-urgent messages can be sent to your provider as well.   To learn more about what you can do with MyChart, go to [email or web address].    Your next appointment:   4 month(s)  Provider:   Bernadene Person NP

## 2023-01-25 ENCOUNTER — Ambulatory Visit (INDEPENDENT_AMBULATORY_CARE_PROVIDER_SITE_OTHER): Payer: Medicare Other | Admitting: Pulmonary Disease

## 2023-01-25 ENCOUNTER — Encounter (HOSPITAL_BASED_OUTPATIENT_CLINIC_OR_DEPARTMENT_OTHER): Payer: Self-pay | Admitting: Pulmonary Disease

## 2023-01-25 VITALS — BP 118/62 | HR 84 | Resp 20 | Ht 72.0 in | Wt 203.0 lb

## 2023-01-25 DIAGNOSIS — J849 Interstitial pulmonary disease, unspecified: Secondary | ICD-10-CM

## 2023-01-25 DIAGNOSIS — J438 Other emphysema: Secondary | ICD-10-CM

## 2023-01-25 DIAGNOSIS — J9611 Chronic respiratory failure with hypoxia: Secondary | ICD-10-CM

## 2023-01-25 MED ORDER — SODIUM CHLORIDE 3 % IN NEBU: INHALATION_SOLUTION | Freq: Every day | RESPIRATORY_TRACT | 12 refills | Status: DC | PRN

## 2023-01-25 NOTE — Progress Notes (Signed)
Subjective:    Patient ID: Marc Gilbert, male    DOB: 01-25-1931, 89 y.o.   MRN: [id number]  HPI   Chief Complaint  Patient presents with   Consult    SOB since September after having pneumonia. Was at 88 on 4 L pulse upon walking to room went to 97 on 3 L continuous  89 year old remote smoker presents for evaluation of shortness of breath and hypoxia. He is accompanied by his daughter Myrlene Broker who is a retired gynecologist and his wife  He reports dyspnea on exertion ongoing for few months.  He was admitted 10/2022 with a diagnosis of "COPD versus atypical pneumonia" and discharged on oxygen.  He has obtained a portable concentrator but this does not seem to be enough.  He arrives at 88% on 4 L pulse and was placed on 3 L continuous.  He reports when he checks with his pulse oximeter and his oxygen saturation often drops into the 70s on exertion and states is 88% on room air. He reports sputum production, clear on a daily basis. PCP has him on Spiriva and albuterol nebs.  He has been taking Mucinex for a month without relief. I have reviewed cardiology evaluation  He smoked 3/4 pack/day until he quit in 1995, about 30 pack years  On ambulation oxygen saturation dropped on pulse O2 and required 6 L continuous to maintain about 88%   PMH : chronic diastolic heart failure,  paroxysmal atrial fibrillation, status post ablation in 08/10/2022 on Eliquis, history of coronary artery disease status post CABG,  chronic kidney see stage IIIa, hypothyroidism,   Significant tests/ events reviewed  HRCT chest 03/2018 probable UIP pattern, no honeycombing  Echo 10/2022 RVSP 38, enlarged RV  Past Medical History:  Diagnosis Date   Arthritis    "right hand; back" (08/30/2015)   Cancer (HCC)    skin  squamous and basal   Chronic lower back pain    CKD (chronic kidney disease), stage III (HCC)    Stage III   COPD (chronic obstructive pulmonary disease) (HCC)    no home O2   Coronary  artery disease    Dysplastic colon polyp age 89   carcinoma in situ   GERD (gastroesophageal reflux disease)    occ   H/O hiatal hernia    Hyperlipidemia    Hypertension    Prostate cancer (HCC) 07/2014   active surveillance, Glisson 6    Past Surgical History:  Procedure Laterality Date   A-FLUTTER ABLATION N/A 08/10/2022   Procedure: A-FLUTTER ABLATION;  Surgeon: Mealor, [name], MD;  Location: [facility] INVASIVE CV LAB;  Service: Cardiovascular;  Laterality: N/A;   CARDIAC CATHETERIZATION     CATARACT EXTRACTION W/ INTRAOCULAR LENS IMPLANT Left 08/27/2015   CORONARY ARTERY BYPASS GRAFT N/A 08/14/2016   Procedure: CORONARY ARTERY BYPASS GRAFTING (CABG)x2, using left internal mammary artery and right greater saphenous vein harvested endoscopically;  Surgeon: Alleen Borne, MD;  Location: [facility] OR;  Service: Open Heart Surgery;  Laterality: N/A;   JOINT REPLACEMENT     LEFT HEART CATH AND CORONARY ANGIOGRAPHY N/A 08/11/2016   Procedure: Left Heart Cath and Coronary Angiography;  Surgeon: Lyn Records, MD;  Location: [facility] INVASIVE CV LAB;  Service: Cardiovascular;  Laterality: N/A;   MOHS SURGERY     multiple SCC   PROSTATE BIOPSY  07/18/2014   TEE WITHOUT CARDIOVERSION N/A 08/14/2016   Procedure: TRANSESOPHAGEAL ECHOCARDIOGRAM (TEE);  Surgeon: Alleen Borne, MD;  Location: [facility] OR;  Service: Open Heart Surgery;  Laterality: N/A;   TONSILLECTOMY     TOTAL HIP ARTHROPLASTY  05/26/2011   Procedure: TOTAL HIP ARTHROPLASTY;  Surgeon: Valeria Batman, MD;  Location: [facility] OR;  Service: Orthopedics;  Laterality: Right;   TOTAL HIP ARTHROPLASTY Left 12/25/2021   Procedure: LEFT TOTAL HIP ARTHROPLASTY ANTERIOR APPROACH;  Surgeon: Kathryne Hitch, MD;  Location: [facility] ORS;  Service: Orthopedics;  Laterality: Left;   TOTAL SHOULDER ARTHROPLASTY Right 06/18/2020   Procedure: TOTAL SHOULDER ARTHROPLASTY;  Surgeon: Teryl Lucy, MD;  Location: [facility] ORS;  Service: Orthopedics;  Laterality: Right;    ULTRASOUND GUIDANCE FOR VASCULAR ACCESS  08/11/2016   Procedure: Ultrasound Guidance For Vascular Access;  Surgeon: Lyn Records, MD;  Location: [facility] INVASIVE CV LAB;  Service: Cardiovascular;;    No Known Allergies Social History   Socioeconomic History   Marital status: Married    Spouse name: Not on file   Number of children: Not on file   Years of education: Not on file   Highest education level: Not on file  Occupational History   Occupation: retired 1996  Tobacco Use   Smoking status: Former    Current packs/day: 0.00    Average packs/day: 0.5 packs/day for 25.0 years (12.5 ttl pk-yrs)    Types: Cigarettes    Start date: 05/25/1963    Quit date: 05/24/1988    Years since quitting: 34.6   Smokeless tobacco: Never  Vaping Use   Vaping status: Never Used  Substance and Sexual Activity   Alcohol use: Yes    Alcohol/week: 10.0 standard drinks of alcohol    Types: 5 Glasses of wine, 5 Cans of beer per week    Comment: daily   Drug use: No   Sexual activity: Not Currently  Other Topics Concern   Not on file  Social History Narrative   Not on file   Social Determinants of Health   Financial Resource Strain: Not on file  Food Insecurity: No Food Insecurity (10/31/2022)   Hunger Vital Sign    Worried About Running Out of Food in the Last Year: Never true    Ran Out of Food in the Last Year: Never true  Transportation Needs: No Transportation Needs (10/31/2022)   PRAPARE - Administrator, Civil Service (Medical): No    Lack of Transportation (Non-Medical): No  Physical Activity: Not on file  Stress: Not on file  Social Connections: Not on file  Intimate Partner Violence: Not At Risk (10/31/2022)   Humiliation, Afraid, Rape, and Kick questionnaire    Fear of Current or Ex-Partner: No    Emotionally Abused: No    Physically Abused: No    Sexually Abused: No      Review of Systems Constitutional: negative for anorexia, fevers and sweats  Eyes: negative for  irritation, redness and visual disturbance  Ears, nose, mouth, throat, and face: negative for earaches, epistaxis, nasal congestion and sore throat   Cardiovascular: negative for chest pain, dyspnea, lower extremity edema, orthopnea, palpitations and syncope  Gastrointestinal: negative for abdominal pain, constipation, diarrhea, melena, nausea and vomiting  Genitourinary:negative for dysuria, frequency and hematuria  Hematologic/lymphatic: negative for bleeding, easy bruising and lymphadenopathy  Musculoskeletal:negative for arthralgias, muscle weakness and stiff joints  Neurological: negative for coordination problems, gait problems, headaches and weakness  Endocrine: negative for diabetic symptoms including polydipsia, polyuria and weight loss     Objective:   Physical Exam  Gen. Pleasant, well-nourished, elderly, in no distress, normal affect  ENT - no pallor,icterus, no post nasal drip Neck: No JVD, no thyromegaly, no carotid bruits Lungs: no use of accessory muscles, no dullness to percussion, bilateral halfway crackles Cardiovascular: Rhythm regular, heart sounds  normal, no murmurs or gallops, no peripheral edema Abdomen: soft and non-tender, no hepatosplenomegaly, BS normal. Musculoskeletal: No deformities, no cyanosis or clubbing Neuro:  alert, non focal       Assessment & Plan:    Assessment:    1 or more chronic illnesses with severe exacerbation, progression, or side effects of treatment;   1 acute or chronic illness or injury that poses a threat to life or bodily function  Plan Following Extensive Data Review & Interpretation:   I reviewed prior external note(s) from cardiology, PCP, discharge summary  I reviewed the result(s) of imaging including chest x-ray and CT and blood work  I have ordered HRCT, PFTs, blood work  Independent interpretation of tests  Review of patient's CT images revealed ILD. The patient's images have been independently reviewed by me.     Discussion of management or test interpretation with another colleague PCP.

## 2023-01-25 NOTE — Assessment & Plan Note (Addendum)
Ambulatory saturation to decide how much oxygen he needs while walking >> seems to require 6 L continuous to maintain saturation while walking Refer to pulmonary rehab program in [facility]

## 2023-01-25 NOTE — Assessment & Plan Note (Signed)
Not sure this is a significant issue. Obtain PFTs to discriminate between ILD and COPD. Continue Spiriva in the meantime. Okay to use albuterol nebs as needed. Hypertonic saline nebs for mucus production

## 2023-01-25 NOTE — Patient Instructions (Addendum)
X Amb sat  X HRCT chest  PFTs Blood work today  X referral to pulm rehab @ [facility]

## 2023-01-25 NOTE — Assessment & Plan Note (Addendum)
ILD seems to be the main issue.  High-resolution CT chest in 2020 showed probable UIP pattern so I suspect that this is most likely going to be IPF. Will repeat high-resolution CT chest and obtain PFTs. Will obtain basic serology but once again I do not see any stigmata of collagen vascular disease.  We discussed antifibrotic's briefly.  He has a lifelong history of constipation and does not mind side effect of diarrhea from nintedanib.  I discussed side effect profile of medications. Will obtain LFTs in anticipation

## 2023-01-26 ENCOUNTER — Telehealth (HOSPITAL_COMMUNITY): Payer: Self-pay | Admitting: *Deleted

## 2023-01-26 ENCOUNTER — Telehealth (HOSPITAL_BASED_OUTPATIENT_CLINIC_OR_DEPARTMENT_OTHER): Payer: Self-pay | Admitting: Pulmonary Disease

## 2023-01-26 ENCOUNTER — Telehealth: Payer: Self-pay | Admitting: Pulmonary Disease

## 2023-01-26 DIAGNOSIS — J9611 Chronic respiratory failure with hypoxia: Secondary | ICD-10-CM

## 2023-01-26 DIAGNOSIS — J849 Interstitial pulmonary disease, unspecified: Secondary | ICD-10-CM

## 2023-01-26 DIAGNOSIS — J438 Other emphysema: Secondary | ICD-10-CM

## 2023-01-26 LAB — HEPATIC FUNCTION PANEL
ALT: 25 [IU]/L (ref 0–44)
AST: 27 [IU]/L (ref 0–40)
Albumin: 3.9 g/dL (ref 3.6–4.6)
Alkaline Phosphatase: 84 [IU]/L (ref 44–121)
Bilirubin Total: 0.8 mg/dL (ref 0.0–1.2)
Bilirubin, Direct: 0.29 mg/dL (ref 0.00–0.40)
Total Protein: 7.3 g/dL (ref 6.0–8.5)

## 2023-01-26 LAB — ANA+ENA+DNA/DS+SCL 70+SJOSSA/B
ANA Titer 1: NEGATIVE
ENA RNP Ab: <0.2 AI (ref 0.0–0.9)
ENA SM Ab Ser-aCnc: <0.2 AI (ref 0.0–0.9)
ENA SSA (RO) Ab: <0.2 AI (ref 0.0–0.9)
ENA SSB (LA) Ab: <0.2 AI (ref 0.0–0.9)
Scleroderma (Scl-70) (ENA) Antibody, IgG: <0.2 AI (ref 0.0–0.9)
dsDNA Ab: <1 [IU]/mL (ref 0–9)

## 2023-01-26 LAB — CK: Total CK: 71 U/L (ref 30–208)

## 2023-01-26 MED ORDER — SODIUM CHLORIDE 3 % IN NEBU: INHALATION_SOLUTION | Freq: Every day | RESPIRATORY_TRACT | 12 refills | Status: DC | PRN

## 2023-01-26 NOTE — Telephone Encounter (Signed)
[employer] calling on behalf of patient. States patient is now need 1L more. Patient is currently on a POC but will need tank delivery now. In order to send O2 tanks to patients home a new order will need to be sent in.  F: [phone]  Please advise

## 2023-01-26 NOTE — Telephone Encounter (Signed)
sodium chloride HYPERTONIC 3 % nebulizer solution [[id number]]   [facility] calling. They need a Diagnosis code before they can fill. Please resubmit.

## 2023-01-26 NOTE — Telephone Encounter (Signed)
Received call back.  Pt advised of closest location for pulmonary rehab is [facility]. Agreeable to participate at [facility].  General program information provided as well as location.  Will forward to support staff for insurance verification. Thanked me for the information. Alanson Aly, BSN Cardiac and Emergency planning/management officer

## 2023-01-26 NOTE — Telephone Encounter (Signed)
Order placed with diagnosed code nfn

## 2023-01-26 NOTE — Telephone Encounter (Signed)
Order placed to increase oxygen.

## 2023-01-26 NOTE — Telephone Encounter (Signed)
Received referral from Dr. Vassie Loll for this pt to participate in pulmonary rehab.  Noted in the referral that his preference of location would be [facility].  Called and left message to advise that [facility] has not resumed pulmonary rehab and would he like to participate here at [facility].  Contact information provided.  Await return call. Alanson Aly, BSN Cardiac and Emergency planning/management officer

## 2023-01-28 ENCOUNTER — Encounter (HOSPITAL_BASED_OUTPATIENT_CLINIC_OR_DEPARTMENT_OTHER): Payer: Self-pay | Admitting: Pulmonary Disease

## 2023-01-28 DIAGNOSIS — J438 Other emphysema: Secondary | ICD-10-CM

## 2023-01-28 DIAGNOSIS — J9611 Chronic respiratory failure with hypoxia: Secondary | ICD-10-CM

## 2023-01-28 DIAGNOSIS — J849 Interstitial pulmonary disease, unspecified: Secondary | ICD-10-CM

## 2023-01-29 ENCOUNTER — Telehealth (HOSPITAL_COMMUNITY): Payer: Self-pay

## 2023-01-29 NOTE — Telephone Encounter (Signed)
Called patient to see if he was interested in participating in the Pulmonary Rehab Program. Patient stated yes. Patient will come in for orientation on 02/01/23 @ 1:30PM and will attend the 1:15PM exercise class.   Mailed letter

## 2023-01-29 NOTE — Telephone Encounter (Signed)
Pt insurance is active and benefits verified through Medicare A/B. Co-pay $0.00, DED $240.00/$240.00 met, out of pocket $0.00/$0.00 met, co-insurance 20%. No pre-authorization required. 01/29/23 @ 9:16AM   2ndary insurance is active and benefits verified through [place]. Co-pay $0.00, DED $0.00/$0.00 met, out of pocket $0.00/$0.00 met, co-insurance 0%. No pre-authorization required. 01/29/23 @ 9:16AM

## 2023-02-01 ENCOUNTER — Encounter (HOSPITAL_COMMUNITY): Payer: Self-pay

## 2023-02-01 ENCOUNTER — Encounter (HOSPITAL_COMMUNITY)
Admission: RE | Admit: 2023-02-01 | Discharge: 2023-02-01 | Disposition: A | Discharge status: Home / Self Care | Payer: Medicare Other | Source: Ambulatory Visit | Attending: Pulmonary Disease | Admitting: Pulmonary Disease

## 2023-02-01 VITALS — BP 106/60 | HR 86 | Ht 70.0 in | Wt 199.5 lb

## 2023-02-01 DIAGNOSIS — J849 Interstitial pulmonary disease, unspecified: Secondary | ICD-10-CM | POA: Insufficient documentation

## 2023-02-01 NOTE — Progress Notes (Signed)
Marc Gilbert 89 y.o. male  Pulmonary Rehab Orientation Note  This patient who was referred to Pulmonary Rehab by Dr. Vassie Loll with the diagnosis of ILD arrived today in Cardiac and Pulmonary Rehab. He arrived ambulatory with assistive device with normal gait. He does carry portable oxygen. [employer] is the provider for their DME. Per patient, Marc Gilbert uses oxygen continuously. Color good, skin warm and dry. Patient is oriented to time and place. Patient's medical history, psychosocial health, and medications reviewed.   Psychosocial assessment reveals patient lives with spouse. Marc Gilbert is currently retired. Patient hobbies include spending time with others. Patient reports his stress level is low. Areas of stress/anxiety include health and family . Patient does not exhibit signs of depression. Signs of depression include hopelessness and fatigue. PHQ2/9 score 2/3. Marc Gilbert shows good  coping skills with positive outlook on life. Offered emotional support and reassurance. Will continue to monitor and evaluate progress toward psychosocial goal(s) of decreased stress.   Physical assessment reveals heart rate is normal, breath sounds crackles to auscultation. Grip strength equal, strong. Distal pulses present. Marc Gilbert reports he  does take medications as prescribed. Patient states he  follows a low fat  diet. The patient has been trying to lose weight through a healthy diet and exercise program. Pt's weight will be monitored closely.   Demonstration and practice of PLB using pulse oximeter. Marc Gilbert able to return demonstration satisfactorily. Safety and hand hygiene in the exercise area reviewed with patient. Marc Gilbert voices understanding of the information reviewed. Department expectations discussed with patient and achievable goals were set. The patient shows enthusiasm about attending the program and we look forward to working with [facility]. Marc Gilbert completed a 6 min walk test today and is scheduled to begin exercise on  02/10/25 at 1315.   [id number] [facility]

## 2023-02-01 NOTE — Progress Notes (Signed)
Pulmonary Individual Treatment Plan  Patient Details  Name: DJIMON STUTES MRN: [id number] Date of Birth: 1930-08-26 Referring Provider:   Doristine Devoid Pulmonary Rehab Walk Test from 02/01/2023 in [facility]  Referring Provider Dr. Vassie Loll       Initial Encounter Date:  Flowsheet Row Pulmonary Rehab Walk Test from 02/01/2023 in [facility]  Date 02/01/23       Visit Diagnosis: ILD (interstitial lung disease) (HCC)  Patient's Home Medications on Admission:   Current Outpatient Medications:    acetaminophen (TYLENOL) 500 MG tablet, Take 1,000 mg by mouth every 6 (six) hours as needed for mild pain or moderate pain (for pain)., Disp: , Rfl:    apixaban (ELIQUIS) 5 MG TABS tablet, Take 1 tablet (5 mg total) by mouth 2 (two) times daily., Disp: 60 tablet, Rfl: 6   atorvastatin (LIPITOR) 40 MG tablet, Take 1 tablet (40 mg total) by mouth at bedtime., Disp: 90 tablet, Rfl: 3   carvedilol (COREG) 3.125 MG tablet, Take 1 tablet (3.125 mg total) by mouth 2 (two) times daily., Disp: 180 tablet, Rfl: 3   diclofenac Sodium (VOLTAREN) 1 % GEL, Apply 1 application  topically See admin instructions. Apply 2-4 grams of gel topically to both knees once a day, Disp: , Rfl:    docusate sodium (COLACE) 250 MG capsule, Take 250 mg by mouth daily., Disp: , Rfl:    empagliflozin (JARDIANCE) 10 MG TABS tablet, Take 1 tablet (10 mg total) by mouth daily., Disp: 90 tablet, Rfl: 3   gabapentin (NEURONTIN) 300 MG capsule, Take 300-600 mg by mouth See admin instructions. Take 300 mg by mouth between 3 AM-4 AM, 600 mg by mouth after breakfast, 600 mg between 3 PM-4 PM, and 300 mg at bedtime, Disp: , Rfl:    Glycerin-Hypromellose-PEG 400 (DRY EYE RELIEF DROPS) 0.2-0.2-1 % SOLN, Place 1 drop into both eyes 3 (three) times daily as needed (for dryness)., Disp: , Rfl:    guaiFENesin (MUCINEX) 600 MG 12  hr tablet, Take by mouth 2 (two) times daily., Disp: , Rfl:    levothyroxine (SYNTHROID) 50 MCG tablet, Take 50 mcg by mouth daily before breakfast., Disp: , Rfl:    Melatonin 10 MG TABS, Take 10 mg by mouth at bedtime., Disp: , Rfl:    Multiple Vitamins-Minerals (ONE-A-DAY MENS 50+ ADVANTAGE) TABS, Take 1 tablet by mouth daily with breakfast., Disp: , Rfl:    Omega-3 Fatty Acids (FISH OIL OMEGA-3 PO), Take 1,960 mg by mouth daily., Disp: , Rfl:    oxybutynin (DITROPAN) 5 MG tablet, Take 5 mg by mouth at bedtime., Disp: , Rfl:    polyethylene glycol (MIRALAX / GLYCOLAX) 17 g packet, Take 8.5 g by mouth daily as needed for mild constipation or moderate constipation., Disp: , Rfl:    sodium chloride (OCEAN) 0.65 % SOLN nasal spray, Place 1 spray into both nostrils as needed for congestion., Disp: , Rfl:    sodium chloride HYPERTONIC 3 % nebulizer solution, Take by nebulization daily as needed for other., Disp: 750 mL, Rfl: 12   SPIRIVA RESPIMAT 2.5 MCG/ACT AERS, SMARTSIG:2 Puff(s) Via Inhaler Daily, Disp: , Rfl:    Tamsulosin HCl (FLOMAX) 0.4 MG CAPS, Take 0.4 mg by mouth at bedtime., Disp: , Rfl:    testosterone cypionate (DEPOTESTOSTERONE CYPIONATE) 200 MG/ML injection, Inject 66.67 mg into the skin every Wednesday., Disp: , Rfl:    furosemide (  LASIX) 20 MG tablet, Take 1 tablet (20 mg total) by mouth daily for 10 days. (Patient taking differently: Take 20 mg by mouth daily as needed for fluid or edema.), Disp: 30 tablet, Rfl: 0   ondansetron (ZOFRAN) 4 MG tablet, Take 1 tablet (4 mg total) by mouth every 8 (eight) hours as needed for nausea or vomiting. (Patient not taking: Reported on 02/01/2023), Disp: 20 tablet, Rfl: 0  Past Medical History: Past Medical History:  Diagnosis Date   Arthritis    "right hand; back" (08/30/2015)   Cancer (HCC)    skin  squamous and basal   Chronic lower back pain    CKD (chronic kidney disease), stage III (HCC)    Stage III   COPD (chronic obstructive  pulmonary disease) (HCC)    no home O2   Coronary artery disease    Dysplastic colon polyp age 13   carcinoma in situ   GERD (gastroesophageal reflux disease)    occ   H/O hiatal hernia    Hyperlipidemia    Hypertension    Prostate cancer (HCC) 07/2014   active surveillance, [place] 6    Tobacco Use: Social History   Tobacco Use  Smoking Status Former   Current packs/day: 0.00   Average packs/day: 0.5 packs/day for 25.0 years (12.5 ttl pk-yrs)   Types: Cigarettes   Start date: 05/25/1963   Quit date: 05/24/1988   Years since quitting: 34.7  Smokeless Tobacco Never    Labs: Review Flowsheet       Latest Ref Rng & Units 08/10/2016 08/14/2016 08/15/2016 10/30/2022  Labs for ITP Cardiac and Pulmonary Rehab  Cholestrol 0 - 200 mg/dL 409  - - -  LDL (calc) 0 - 99 mg/dL 87  - - -  HDL-C >81 mg/dL 56  - - -  Trlycerides <150 mg/dL 72  - - -  Hemoglobin X9J 4.8 - 5.6 % 5.7  - - -  PH, Arterial 7.350 - 7.450 - 7.338  7.341  7.387  7.472  7.349  - -  PCO2 arterial 32.0 - 48.0 mmHg - 37.1  34.8  37.6  35.8  49.8  - -  Bicarbonate 20.0 - 28.0 mmol/L - 19.9  18.8  22.9  26.2  27.4  - 27.8   TCO2 0 - 100 mmol/L - 21  20  20  24  27  28  29  28  27  27  25   -  Acid-base deficit 0.0 - 2.0 mmol/L - 5.0  6.0  2.0  - -  O2 Saturation % - 96.0  96.0  99.0  100.0  100.0  - 32     Details       Multiple values from one day are sorted in reverse-chronological order         Capillary Blood Glucose: Lab Results  Component Value Date   GLUCAP 147 (H) 08/16/2016   GLUCAP 128 (H) 08/16/2016   GLUCAP 124 (H) 08/16/2016   GLUCAP 110 (H) 08/16/2016   GLUCAP 114 (H) 08/16/2016     Pulmonary Assessment Scores:  Pulmonary Assessment Scores     Row Name 02/01/23 1514         ADL UCSD   ADL Phase Entry     SOB Score total 38       CAT Score   CAT Score 11       mMRC Score   mMRC Score 3  UCSD: Self-administered rating of dyspnea associated with activities of  daily living (ADLs) 6-point scale (0 = "not at all" to 5 = "maximal or unable to do because of breathlessness")  Scoring Scores range from 0 to 120.  Minimally important difference is 5 units  CAT: CAT can identify the health impairment of COPD patients and is better correlated with disease progression.  CAT has a scoring range of zero to 40. The CAT score is classified into four groups of low (less than 10), medium (10 - 20), high (21-30) and very high (31-40) based on the impact level of disease on health status. A CAT score over 10 suggests significant symptoms.  A worsening CAT score could be explained by an exacerbation, poor medication adherence, poor inhaler technique, or progression of COPD or comorbid conditions.  CAT MCID is 2 points  mMRC: mMRC (Modified Medical Research Council) Dyspnea Scale is used to assess the degree of baseline functional disability in patients of respiratory disease due to dyspnea. No minimal important difference is established. A decrease in score of 1 point or greater is considered a positive change.   Pulmonary Function Assessment:  Pulmonary Function Assessment - 02/01/23 1329       Breath   Bilateral Breath Sounds Decreased    Shortness of Breath Yes;Limiting activity;Fear of Shortness of Breath             Exercise Target Goals: Exercise Program Goal: Individual exercise prescription set using results from initial 6 min walk test and THRR while considering  patient's activity barriers and safety.   Exercise Prescription Goal: Initial exercise prescription builds to 30-45 minutes a day of aerobic activity, 2-3 days per week.  Home exercise guidelines will be given to patient during program as part of exercise prescription that the participant will acknowledge.  Activity Barriers & Risk Stratification:  Activity Barriers & Cardiac Risk Stratification - 02/01/23 1328       Activity Barriers & Cardiac Risk Stratification   Activity Barriers  Deconditioning;Muscular Weakness;Shortness of Breath;Assistive Device             6 Minute Walk:  6 Minute Walk     Row Name 02/01/23 1512         6 Minute Walk   Phase Initial     Distance 600 feet     Walk Time 6 minutes     # of Rest Breaks 2     MPH 1.14     METS 0.55     RPE 12     Perceived Dyspnea  1     VO2 Peak 1.93     Symptoms No     Resting HR 84 bpm     Resting BP 106/60     Resting Oxygen Saturation  98 %     Exercise Oxygen Saturation  during 6 min walk 86 %     Max Ex. HR 111 bpm     Max Ex. BP 116/70     2 Minute Post BP 104/54       Interval HR   1 Minute HR 85     2 Minute HR 91     3 Minute HR 111     4 Minute HR 99     5 Minute HR 104     6 Minute HR 104     2 Minute Post HR 89     Interval Heart Rate? Yes       Interval Oxygen  Interval Oxygen? Yes     Baseline Oxygen Saturation % 98 %     1 Minute Oxygen Saturation % 95 %     1 Minute Liters of Oxygen 6 L     2 Minute Oxygen Saturation % 92 %     2 Minute Liters of Oxygen 6 L     3 Minute Oxygen Saturation % 86 %  2:30-2:50 increased O2 to 8L     3 Minute Liters of Oxygen 8 L     4 Minute Oxygen Saturation % 86 %  3:30-4:00 increased O2 to 10L     4 Minute Liters of Oxygen 10 L     5 Minute Oxygen Saturation % 91 %     5 Minute Liters of Oxygen 15 L     6 Minute Oxygen Saturation % 93 %     6 Minute Liters of Oxygen 15 L     2 Minute Post Oxygen Saturation % 95 %     2 Minute Post Liters of Oxygen 6 L              Oxygen Initial Assessment:  Oxygen Initial Assessment - 02/01/23 1328       Home Oxygen   Home Oxygen Device Portable Concentrator;Home Concentrator   in process of getting tanks   Sleep Oxygen Prescription Continuous    Liters per minute 5    Home Exercise Oxygen Prescription Continuous    Liters per minute 5    Home Resting Oxygen Prescription Continuous    Liters per minute 5    Compliance with Home Oxygen Use Yes      Initial 6 min Walk   Oxygen  Used Continuous    Liters per minute 15      Program Oxygen Prescription   Program Oxygen Prescription Continuous    Liters per minute 15      Intervention   Short Term Goals To learn and exhibit compliance with exercise, home and travel O2 prescription;To learn and understand importance of maintaining oxygen saturations>88%;To learn and understand importance of monitoring SPO2 with pulse oximeter and demonstrate accurate use of the pulse oximeter.;To learn and demonstrate proper pursed lip breathing techniques or other breathing techniques.     Long  Term Goals Exhibits compliance with exercise, home  and travel O2 prescription;Exhibits proper breathing techniques, such as pursed lip breathing or other method taught during program session;Verbalizes importance of monitoring SPO2 with pulse oximeter and return demonstration;Maintenance of O2 saturations>88%             Oxygen Re-Evaluation:   Oxygen Discharge (Final Oxygen Re-Evaluation):   Initial Exercise Prescription:  Initial Exercise Prescription - 02/01/23 1500       Date of Initial Exercise RX and Referring Provider   Date 02/01/23    Referring Provider Dr. Vassie Loll    Expected Discharge Date 04/29/23      Oxygen   Oxygen Continuous    Liters 6-15    Maintain Oxygen Saturation 88% or higher      Recumbant Bike   Level 1    Minutes 15    METs 1.6      Recumbant Elliptical   Level 1    Minutes 15    METs 1.5      Prescription Details   Frequency (times per week) 2    Duration Progress to 30 minutes of continuous aerobic without signs/symptoms of physical distress      Intensity   THRR 40-80%  of Max Heartrate 52-104    Ratings of Perceived Exertion 11-13    Perceived Dyspnea 0-4      Progression   Progression Continue to progress workloads to maintain intensity without signs/symptoms of physical distress.      Resistance Training   Training Prescription Yes    Weight red bands    Reps 10-15              Perform Capillary Blood Glucose checks as needed.  Exercise Prescription Changes:   Exercise Comments:   Exercise Goals and Review:   Exercise Goals     Row Name 02/01/23 1328             Exercise Goals   Increase Physical Activity Yes       Intervention Provide advice, education, support and counseling about physical activity/exercise needs.;Develop an individualized exercise prescription for aerobic and resistive training based on initial evaluation findings, risk stratification, comorbidities and participant's personal goals.       Expected Outcomes Short Term: Attend rehab on a regular basis to increase amount of physical activity.;Long Term: Exercising regularly at least 3-5 days a week.;Long Term: Add in home exercise to make exercise part of routine and to increase amount of physical activity.       Increase Strength and Stamina Yes       Intervention Provide advice, education, support and counseling about physical activity/exercise needs.;Develop an individualized exercise prescription for aerobic and resistive training based on initial evaluation findings, risk stratification, comorbidities and participant's personal goals.       Expected Outcomes Short Term: Increase workloads from initial exercise prescription for resistance, speed, and METs.;Short Term: Perform resistance training exercises routinely during rehab and add in resistance training at home;Long Term: Improve cardiorespiratory fitness, muscular endurance and strength as measured by increased METs and functional capacity ( )       Able to understand and use rate of perceived exertion (RPE) scale Yes       Intervention Provide education and explanation on how to use RPE scale       Expected Outcomes Short Term: Able to use RPE daily in rehab to express subjective intensity level;Long Term:  Able to use RPE to guide intensity level when exercising independently       Able to understand and use Dyspnea scale  Yes       Intervention Provide education and explanation on how to use Dyspnea scale       Expected Outcomes Short Term: Able to use Dyspnea scale daily in rehab to express subjective sense of shortness of breath during exertion;Long Term: Able to use Dyspnea scale to guide intensity level when exercising independently       Knowledge and understanding of Target Heart Rate Range (THRR) Yes       Intervention Provide education and explanation of THRR including how the numbers were predicted and where they are located for reference       Expected Outcomes Short Term: Able to state/look up THRR;Short Term: Able to use daily as guideline for intensity in rehab;Long Term: Able to use THRR to govern intensity when exercising independently       Understanding of Exercise Prescription Yes       Intervention Provide education, explanation, and written materials on patient's individual exercise prescription       Expected Outcomes Short Term: Able to explain program exercise prescription;Long Term: Able to explain home exercise prescription to exercise independently  Exercise Goals Re-Evaluation :   Discharge Exercise Prescription (Final Exercise Prescription Changes):   Nutrition:  Target Goals: Understanding of nutrition guidelines, daily intake of sodium 1500mg , cholesterol 200mg , calories 30% from fat and 7% or less from saturated fats, daily to have 5 or more servings of fruits and vegetables.  Biometrics:  Pre Biometrics - 02/01/23 1420       Pre Biometrics   Grip Strength 21 kg              Nutrition Therapy Plan and Nutrition Goals:   Nutrition Assessments:  MEDIFICTS Score Key: >=70 Need to make dietary changes  40-70 Heart Healthy Diet <= 40 Therapeutic Level Cholesterol Diet   Picture Your Plate Scores: <08 Unhealthy dietary pattern with much room for improvement. 41-50 Dietary pattern unlikely to meet recommendations for good health and room for  improvement. 51-60 More healthful dietary pattern, with some room for improvement.  >60 Healthy dietary pattern, although there may be some specific behaviors that could be improved.    Nutrition Goals Re-Evaluation:   Nutrition Goals Discharge (Final Nutrition Goals Re-Evaluation):   Psychosocial: Target Goals: Acknowledge presence or absence of significant depression and/or stress, maximize coping skills, provide positive support system. Participant is able to verbalize types and ability to use techniques and skills needed for reducing stress and depression.  Initial Review & Psychosocial Screening:  Initial Psych Review & Screening - 02/01/23 1422       Initial Review   Current issues with Current Stress Concerns    Source of Stress Concerns Family      Family Dynamics   Good Support System? Yes      Screening Interventions   Interventions Encouraged to exercise    Expected Outcomes Long Term Goal: Stressors or current issues are controlled or eliminated.;Short Term goal: Identification and review with participant of any Quality of Life or Depression concerns found by scoring the questionnaire.;Long Term goal: The participant improves quality of Life and PHQ9 Scores as seen by post scores and/or verbalization of changes             Quality of Life Scores:  Scores of 19 and below usually indicate a poorer quality of life in these areas.  A difference of  2-3 points is a clinically meaningful difference.  A difference of 2-3 points in the total score of the Quality of Life Index has been associated with significant improvement in overall quality of life, self-image, physical symptoms, and general health in studies assessing change in quality of life.  PHQ-9: Review Flowsheet       02/01/2023  Depression screen PHQ 2/9  Decreased Interest 1  Down, Depressed, Hopeless 1  PHQ - 2 Score 2  Altered sleeping 0  Tired, decreased energy 1  Change in appetite 0  Feeling bad  or failure about yourself  0  Trouble concentrating 0  Moving slowly or fidgety/restless 0  Suicidal thoughts 0  PHQ-9 Score 3  Difficult doing work/chores Not difficult at all   Interpretation of Total Score  Total Score Depression Severity:  1-4 = Minimal depression, 5-9 = Mild depression, 10-14 = Moderate depression, 15-19 = Moderately severe depression, 20-27 = Severe depression   Psychosocial Evaluation and Intervention:  Psychosocial Evaluation - 02/01/23 1425       Psychosocial Evaluation & Interventions   Interventions Encouraged to exercise with the program and follow exercise prescription    Comments Illiam denies any psychosocial concerns at this time. He does state he  is worried about his health and has to start helping his wife out more d/t short term memory loss. He denies any needs at this time. He states he has an "over supportive" family.    Expected Outcomes For Dozier to attend Pulm Rehab free of any psychosocial barriers or concerns    Continue Psychosocial Services  No Follow up required             Psychosocial Re-Evaluation:   Psychosocial Discharge (Final Psychosocial Re-Evaluation):   Education: Education Goals: Education classes will be provided on a weekly basis, covering required topics. Participant will state understanding/return demonstration of topics presented.  Learning Barriers/Preferences:  Learning Barriers/Preferences - 02/01/23 1540       Learning Barriers/Preferences   Learning Barriers Hearing    Learning Preferences Individual Instruction;Group Instruction;Written Material             Education Topics: Know Your Numbers Group instruction that is supported by a PowerPoint presentation. Instructor discusses importance of knowing and understanding resting, exercise, and post-exercise oxygen saturation, heart rate, and blood pressure. Oxygen saturation, heart rate, blood pressure, rating of perceived exertion, and dyspnea are  reviewed along with a normal range for these values.    Exercise for the Pulmonary Patient Group instruction that is supported by a PowerPoint presentation. Instructor discusses benefits of exercise, core components of exercise, frequency, duration, and intensity of an exercise routine, importance of utilizing pulse oximetry during exercise, safety while exercising, and options of places to exercise outside of rehab.    MET Level  Group instruction provided by PowerPoint, verbal discussion, and written material to support subject matter. Instructor reviews what METs are and how to increase METs.    Pulmonary Medications Verbally interactive group education provided by instructor with focus on inhaled medications and proper administration.   Anatomy and Physiology of the Respiratory System Group instruction provided by PowerPoint, verbal discussion, and written material to support subject matter. Instructor reviews respiratory cycle and anatomical components of the respiratory system and their functions. Instructor also reviews differences in obstructive and restrictive respiratory diseases with examples of each.    Oxygen Safety Group instruction provided by PowerPoint, verbal discussion, and written material to support subject matter. There is an overview of "What is Oxygen" and "Why do we need it".  Instructor also reviews how to create a safe environment for oxygen use, the importance of using oxygen as prescribed, and the risks of noncompliance. There is a brief discussion on traveling with oxygen and resources the patient may utilize.   Oxygen Use Group instruction provided by PowerPoint, verbal discussion, and written material to discuss how supplemental oxygen is prescribed and different types of oxygen supply systems. Resources for more information are provided.    Breathing Techniques Group instruction that is supported by demonstration and informational handouts. Instructor  discusses the benefits of pursed lip and diaphragmatic breathing and detailed demonstration on how to perform both.     Risk Factor Reduction Group instruction that is supported by a PowerPoint presentation. Instructor discusses the definition of a risk factor, different risk factors for pulmonary disease, and how the heart and lungs work together.   Pulmonary Diseases Group instruction provided by PowerPoint, verbal discussion, and written material to support subject matter. Instructor gives an overview of the different type of pulmonary diseases. There is also a discussion on risk factors and symptoms as well as ways to manage the diseases.   Stress and Energy Conservation Group instruction provided by PowerPoint, verbal discussion,  and written material to support subject matter. Instructor gives an overview of stress and the impact it can have on the body. Instructor also reviews ways to reduce stress. There is also a discussion on energy conservation and ways to conserve energy throughout the day.   Warning Signs and Symptoms Group instruction provided by PowerPoint, verbal discussion, and written material to support subject matter. Instructor reviews warning signs and symptoms of stroke, heart attack, cold and flu. Instructor also reviews ways to prevent the spread of infection.   Other Education Group or individual verbal, written, or video instructions that support the educational goals of the pulmonary rehab program.    Knowledge Questionnaire Score:  Knowledge Questionnaire Score - 02/01/23 1524       Knowledge Questionnaire Score   Pre Score 16/18             Core Components/Risk Factors/Patient Goals at Admission:  Personal Goals and Risk Factors at Admission - 02/01/23 1330       Core Components/Risk Factors/Patient Goals on Admission   Improve shortness of breath with ADL's Yes    Intervention Provide education, individualized exercise plan and daily activity  instruction to help decrease symptoms of SOB with activities of daily living.    Expected Outcomes Short Term: Improve cardiorespiratory fitness to achieve a reduction of symptoms when performing ADLs             Core Components/Risk Factors/Patient Goals Review:    Core Components/Risk Factors/Patient Goals at Discharge (Final Review):    ITP Comments:   Comments: Dr. Mechele Collin is Medical Director for Pulmonary Rehab at [facility].

## 2023-02-03 ENCOUNTER — Telehealth: Payer: Self-pay | Admitting: Pulmonary Disease

## 2023-02-03 ENCOUNTER — Other Ambulatory Visit (HOSPITAL_BASED_OUTPATIENT_CLINIC_OR_DEPARTMENT_OTHER): Payer: Self-pay

## 2023-02-03 ENCOUNTER — Encounter (HOSPITAL_BASED_OUTPATIENT_CLINIC_OR_DEPARTMENT_OTHER): Payer: Self-pay

## 2023-02-03 DIAGNOSIS — J849 Interstitial pulmonary disease, unspecified: Secondary | ICD-10-CM

## 2023-02-03 DIAGNOSIS — J9611 Chronic respiratory failure with hypoxia: Secondary | ICD-10-CM

## 2023-02-03 DIAGNOSIS — J438 Other emphysema: Secondary | ICD-10-CM

## 2023-02-03 MED ORDER — SODIUM CHLORIDE 3 % IN NEBU: INHALATION_SOLUTION | Freq: Every day | RESPIRATORY_TRACT | 12 refills | Status: AC | PRN

## 2023-02-03 NOTE — Telephone Encounter (Signed)
[facility] calling saying the diag code is wrong for this RX. Please examine, correct, resubmit

## 2023-02-03 NOTE — Telephone Encounter (Signed)
Spoke to [place] with [employer].   She stated that insurance will only cover hypertonic solution if it is written with a concentrated medication like albuterol.    Dr. Vassie Loll, please advise. Thanks

## 2023-02-03 NOTE — Telephone Encounter (Signed)
[facility] pharmacy is calling. In order for patient to receive the medication according to the insurance company they need it to be paired with a concentrated albuterol in order for part B to cover it. The concentrated albuterol should go through. Pharmacist  Joselyn Glassman can be reached at [phone]

## 2023-02-04 MED ORDER — ALBUTEROL SULFATE (5 MG/ML) 0.5% IN NEBU: 2.5000 mg | INHALATION_SOLUTION | Freq: Four times a day (QID) | RESPIRATORY_TRACT | 12 refills | Status: DC | PRN

## 2023-02-04 NOTE — Addendum Note (Signed)
Addended by: Jama Flavors on: 02/04/2023 08:52 AM   Modules accepted: Orders

## 2023-02-04 NOTE — Telephone Encounter (Signed)
Order placed

## 2023-02-04 NOTE — Telephone Encounter (Signed)
Insurance not wanting to pay for the saline. The patient has been advised that if he wants this medication that it is $40 using [facility] unless you want to send something else in. The pharmacy doesn't carry concentrated albuterol and they cover the saline to mix it with the albuterol. Do you want them to order the concentrated albuterol? If so please place order with instructions on how to dilute with saline.

## 2023-02-04 NOTE — Telephone Encounter (Signed)
Called and spoke with pharmacy, placed a order for albuterol 5ml for insurance to cover. Order placed nfn

## 2023-02-05 ENCOUNTER — Telehealth (HOSPITAL_COMMUNITY): Payer: Self-pay

## 2023-02-05 NOTE — Telephone Encounter (Signed)
-----   Message from Arcadia V. Alva sent at 02/05/2023  9:39 AM EST ----- Regarding: RE: Pulm Rehab We have amsent an order to DME to up his home concentrator to 10. He can use 5 L during sleep ----- Message ----- From: Essie Hart, RN Sent: 02/01/2023   4:08 PM EST To: Oretha Milch, MD Subject: Wilmon Pali came to [facility] orientation today. He had to use 15L on his , at rest 3L. (On his POC at 5L, 82%) Pt still refusing to use O2 tanks, wants to only use his POC. Educated him about this. Is looking at buying an inogen that goes to 6L, but I told him to hold off for now due to his oxygen needs.   He still hasn't received his new home concentrator (current one he has only goes to 5L). Not sure what new concentrator was ordered for him through [employer], but I wanted to make sure it would provide him enough oxygen.    He also wanted to know if he needs to be sleeping on 5L O2. I informed him I would ask you about that.   Please advise on home concentrator and sleep O2 liter. Thank you

## 2023-02-05 NOTE — Telephone Encounter (Signed)
RN called and updated pt regarding Dr. Reginia Naas orders. Pt understands without assistance. Stated he would call [employer] about his concentrator. Pt will start Pulm Rehab on 12/26.

## 2023-02-07 ENCOUNTER — Ambulatory Visit (HOSPITAL_BASED_OUTPATIENT_CLINIC_OR_DEPARTMENT_OTHER)
Admission: RE | Admit: 2023-02-07 | Discharge: 2023-02-07 | Disposition: A | Discharge status: Home / Self Care | Payer: Medicare Other | Source: Ambulatory Visit | Attending: Pulmonary Disease | Admitting: Pulmonary Disease

## 2023-02-07 DIAGNOSIS — K861 Other chronic pancreatitis: Secondary | ICD-10-CM | POA: Diagnosis not present

## 2023-02-07 DIAGNOSIS — J849 Interstitial pulmonary disease, unspecified: Secondary | ICD-10-CM | POA: Insufficient documentation

## 2023-02-07 DIAGNOSIS — R918 Other nonspecific abnormal finding of lung field: Secondary | ICD-10-CM | POA: Diagnosis not present

## 2023-02-07 DIAGNOSIS — K8689 Other specified diseases of pancreas: Secondary | ICD-10-CM | POA: Diagnosis not present

## 2023-02-07 DIAGNOSIS — J8410 Pulmonary fibrosis, unspecified: Secondary | ICD-10-CM | POA: Diagnosis not present

## 2023-02-09 ENCOUNTER — Encounter (HOSPITAL_COMMUNITY): Payer: Medicare Other

## 2023-02-09 NOTE — Progress Notes (Signed)
Pulmonary Individual Treatment Plan  Patient Details  Name: Marc Gilbert MRN: [id number] Date of Birth: 01/29/1931 Referring Provider:   Doristine Devoid Pulmonary Rehab Walk Test from 02/01/2023 in [facility]  Referring Provider Dr. Vassie Loll       Initial Encounter Date:  Flowsheet Row Pulmonary Rehab Walk Test from 02/01/2023 in [facility]  Date 02/01/23       Visit Diagnosis: ILD (interstitial lung disease) (HCC)  Patient's Home Medications on Admission:   Current Outpatient Medications:    acetaminophen (TYLENOL) 500 MG tablet, Take 1,000 mg by mouth every 6 (six) hours as needed for mild pain or moderate pain (for pain)., Disp: , Rfl:    apixaban (ELIQUIS) 5 MG TABS tablet, Take 1 tablet (5 mg total) by mouth 2 (two) times daily., Disp: 60 tablet, Rfl: 6   atorvastatin (LIPITOR) 40 MG tablet, Take 1 tablet (40 mg total) by mouth at bedtime., Disp: 90 tablet, Rfl: 3   carvedilol (COREG) 3.125 MG tablet, Take 1 tablet (3.125 mg total) by mouth 2 (two) times daily., Disp: 180 tablet, Rfl: 3   diclofenac Sodium (VOLTAREN) 1 % GEL, Apply 1 application  topically See admin instructions. Apply 2-4 grams of gel topically to both knees once a day, Disp: , Rfl:    docusate sodium (COLACE) 250 MG capsule, Take 250 mg by mouth daily., Disp: , Rfl:    empagliflozin (JARDIANCE) 10 MG TABS tablet, Take 1 tablet (10 mg total) by mouth daily., Disp: 90 tablet, Rfl: 3   gabapentin (NEURONTIN) 300 MG capsule, Take 300-600 mg by mouth See admin instructions. Take 300 mg by mouth between 3 AM-4 AM, 600 mg by mouth after breakfast, 600 mg between 3 PM-4 PM, and 300 mg at bedtime, Disp: , Rfl:    Glycerin-Hypromellose-PEG 400 (DRY EYE RELIEF DROPS) 0.2-0.2-1 % SOLN, Place 1 drop into both eyes 3 (three) times daily as needed (for dryness)., Disp: , Rfl:    guaiFENesin (MUCINEX) 600 MG 12  hr tablet, Take by mouth 2 (two) times daily., Disp: , Rfl:    levothyroxine (SYNTHROID) 50 MCG tablet, Take 50 mcg by mouth daily before breakfast., Disp: , Rfl:    Melatonin 10 MG TABS, Take 10 mg by mouth at bedtime., Disp: , Rfl:    Multiple Vitamins-Minerals (ONE-A-DAY MENS 50+ ADVANTAGE) TABS, Take 1 tablet by mouth daily with breakfast., Disp: , Rfl:    Omega-3 Fatty Acids (FISH OIL OMEGA-3 PO), Take 1,960 mg by mouth daily., Disp: , Rfl:    oxybutynin (DITROPAN) 5 MG tablet, Take 5 mg by mouth at bedtime., Disp: , Rfl:    polyethylene glycol (MIRALAX / GLYCOLAX) 17 g packet, Take 8.5 g by mouth daily as needed for mild constipation or moderate constipation., Disp: , Rfl:    sodium chloride (OCEAN) 0.65 % SOLN nasal spray, Place 1 spray into both nostrils as needed for congestion., Disp: , Rfl:    SPIRIVA RESPIMAT 2.5 MCG/ACT AERS, SMARTSIG:2 Puff(s) Via Inhaler Daily, Disp: , Rfl:    Tamsulosin HCl (FLOMAX) 0.4 MG CAPS, Take 0.4 mg by mouth at bedtime., Disp: , Rfl:    testosterone cypionate (DEPOTESTOSTERONE CYPIONATE) 200 MG/ML injection, Inject 66.67 mg into the skin every Wednesday., Disp: , Rfl:    albuterol (PROVENTIL) (5 MG/ML) 0.5% nebulizer solution, Take 0.5 mLs (2.5 mg total) by nebulization every 6 (six) hours as needed for wheezing  or shortness of breath., Disp: 20 mL, Rfl: 12   furosemide (LASIX) 20 MG tablet, Take 1 tablet (20 mg total) by mouth daily for 10 days. (Patient taking differently: Take 20 mg by mouth daily as needed for fluid or edema.), Disp: 30 tablet, Rfl: 0   ondansetron (ZOFRAN) 4 MG tablet, Take 1 tablet (4 mg total) by mouth every 8 (eight) hours as needed for nausea or vomiting. (Patient not taking: Reported on 02/01/2023), Disp: 20 tablet, Rfl: 0   sodium chloride HYPERTONIC 3 % nebulizer solution, Take by nebulization daily as needed for other., Disp: 750 mL, Rfl: 12  Past Medical History: Past Medical History:  Diagnosis Date   Arthritis    "right  hand; back" (08/30/2015)   Cancer (HCC)    skin  squamous and basal   Chronic lower back pain    CKD (chronic kidney disease), stage III (HCC)    Stage III   COPD (chronic obstructive pulmonary disease) (HCC)    no home O2   Coronary artery disease    Dysplastic colon polyp age 16   carcinoma in situ   GERD (gastroesophageal reflux disease)    occ   H/O hiatal hernia    Hyperlipidemia    Hypertension    Prostate cancer (HCC) 07/2014   active surveillance, [place] 6    Tobacco Use: Social History   Tobacco Use  Smoking Status Former   Current packs/day: 0.00   Average packs/day: 0.5 packs/day for 25.0 years (12.5 ttl pk-yrs)   Types: Cigarettes   Start date: 05/25/1963   Quit date: 05/24/1988   Years since quitting: 34.7  Smokeless Tobacco Never    Labs: Review Flowsheet       Latest Ref Rng & Units 08/10/2016 08/14/2016 08/15/2016 10/30/2022  Labs for ITP Cardiac and Pulmonary Rehab  Cholestrol 0 - 200 mg/dL 409  - - -  LDL (calc) 0 - 99 mg/dL 87  - - -  HDL-C >81 mg/dL 56  - - -  Trlycerides <150 mg/dL 72  - - -  Hemoglobin X9J 4.8 - 5.6 % 5.7  - - -  PH, Arterial 7.350 - 7.450 - 7.338  7.341  7.387  7.472  7.349  - -  PCO2 arterial 32.0 - 48.0 mmHg - 37.1  34.8  37.6  35.8  49.8  - -  Bicarbonate 20.0 - 28.0 mmol/L - 19.9  18.8  22.9  26.2  27.4  - 27.8   TCO2 0 - 100 mmol/L - 21  20  20  24  27  28  29  28  27  27  25   -  Acid-base deficit 0.0 - 2.0 mmol/L - 5.0  6.0  2.0  - -  O2 Saturation % - 96.0  96.0  99.0  100.0  100.0  - 32     Details       Multiple values from one day are sorted in reverse-chronological order         Capillary Blood Glucose: Lab Results  Component Value Date   GLUCAP 147 (H) 08/16/2016   GLUCAP 128 (H) 08/16/2016   GLUCAP 124 (H) 08/16/2016   GLUCAP 110 (H) 08/16/2016   GLUCAP 114 (H) 08/16/2016     Pulmonary Assessment Scores:  Pulmonary Assessment Scores     Row Name 02/01/23 1514         ADL UCSD   ADL Phase Entry      SOB Score total 38  CAT Score   CAT Score 11       mMRC Score   mMRC Score 3             UCSD: Self-administered rating of dyspnea associated with activities of daily living (ADLs) 6-point scale (0 = "not at all" to 5 = "maximal or unable to do because of breathlessness")  Scoring Scores range from 0 to 120.  Minimally important difference is 5 units  CAT: CAT can identify the health impairment of COPD patients and is better correlated with disease progression.  CAT has a scoring range of zero to 40. The CAT score is classified into four groups of low (less than 10), medium (10 - 20), high (21-30) and very high (31-40) based on the impact level of disease on health status. A CAT score over 10 suggests significant symptoms.  A worsening CAT score could be explained by an exacerbation, poor medication adherence, poor inhaler technique, or progression of COPD or comorbid conditions.  CAT MCID is 2 points  mMRC: mMRC (Modified Medical Research Council) Dyspnea Scale is used to assess the degree of baseline functional disability in patients of respiratory disease due to dyspnea. No minimal important difference is established. A decrease in score of 1 point or greater is considered a positive change.   Pulmonary Function Assessment:  Pulmonary Function Assessment - 02/01/23 1329       Breath   Bilateral Breath Sounds Decreased    Shortness of Breath Yes;Limiting activity;Fear of Shortness of Breath             Exercise Target Goals: Exercise Program Goal: Individual exercise prescription set using results from initial 6 min walk test and THRR while considering  patient's activity barriers and safety.   Exercise Prescription Goal: Initial exercise prescription builds to 30-45 minutes a day of aerobic activity, 2-3 days per week.  Home exercise guidelines will be given to patient during program as part of exercise prescription that the participant will  acknowledge.  Activity Barriers & Risk Stratification:  Activity Barriers & Cardiac Risk Stratification - 02/01/23 1328       Activity Barriers & Cardiac Risk Stratification   Activity Barriers Deconditioning;Muscular Weakness;Shortness of Breath;Assistive Device             6 Minute Walk:  6 Minute Walk     Row Name 02/01/23 1512         6 Minute Walk   Phase Initial     Distance 600 feet     Walk Time 6 minutes     # of Rest Breaks 2     MPH 1.14     METS 0.55     RPE 12     Perceived Dyspnea  1     VO2 Peak 1.93     Symptoms No     Resting HR 84 bpm     Resting BP 106/60     Resting Oxygen Saturation  98 %     Exercise Oxygen Saturation  during 6 min walk 86 %     Max Ex. HR 111 bpm     Max Ex. BP 116/70     2 Minute Post BP 104/54       Interval HR   1 Minute HR 85     2 Minute HR 91     3 Minute HR 111     4 Minute HR 99     5 Minute HR 104  6 Minute HR 104     2 Minute Post HR 89     Interval Heart Rate? Yes       Interval Oxygen   Interval Oxygen? Yes     Baseline Oxygen Saturation % 98 %     1 Minute Oxygen Saturation % 95 %     1 Minute Liters of Oxygen 6 L     2 Minute Oxygen Saturation % 92 %     2 Minute Liters of Oxygen 6 L     3 Minute Oxygen Saturation % 86 %  2:30-2:50 increased O2 to 8L     3 Minute Liters of Oxygen 8 L     4 Minute Oxygen Saturation % 86 %  3:30-4:00 increased O2 to 10L     4 Minute Liters of Oxygen 10 L     5 Minute Oxygen Saturation % 91 %     5 Minute Liters of Oxygen 15 L     6 Minute Oxygen Saturation % 93 %     6 Minute Liters of Oxygen 15 L     2 Minute Post Oxygen Saturation % 95 %     2 Minute Post Liters of Oxygen 6 L              Oxygen Initial Assessment:  Oxygen Initial Assessment - 02/01/23 1328       Home Oxygen   Home Oxygen Device Portable Concentrator;Home Concentrator   in process of getting tanks   Sleep Oxygen Prescription Continuous    Liters per minute 5    Home Exercise  Oxygen Prescription Continuous    Liters per minute 5    Home Resting Oxygen Prescription Continuous    Liters per minute 5    Compliance with Home Oxygen Use Yes      Initial 6 min Walk   Oxygen Used Continuous    Liters per minute 15      Program Oxygen Prescription   Program Oxygen Prescription Continuous    Liters per minute 15      Intervention   Short Term Goals To learn and exhibit compliance with exercise, home and travel O2 prescription;To learn and understand importance of maintaining oxygen saturations>88%;To learn and understand importance of monitoring SPO2 with pulse oximeter and demonstrate accurate use of the pulse oximeter.;To learn and demonstrate proper pursed lip breathing techniques or other breathing techniques.     Long  Term Goals Exhibits compliance with exercise, home  and travel O2 prescription;Exhibits proper breathing techniques, such as pursed lip breathing or other method taught during program session;Verbalizes importance of monitoring SPO2 with pulse oximeter and return demonstration;Maintenance of O2 saturations>88%             Oxygen Re-Evaluation:  Oxygen Re-Evaluation     Row Name 02/08/23 1205             Program Oxygen Prescription   Program Oxygen Prescription Continuous       Liters per minute 15         Home Oxygen   Home Oxygen Device Portable Concentrator;Home Concentrator  in process of getting tanks       Sleep Oxygen Prescription Continuous       Liters per minute 5       Home Exercise Oxygen Prescription Continuous       Liters per minute 5       Home Resting Oxygen Prescription Continuous       Liters per  minute 5       Compliance with Home Oxygen Use Yes         Goals/Expected Outcomes   Short Term Goals To learn and exhibit compliance with exercise, home and travel O2 prescription;To learn and understand importance of maintaining oxygen saturations>88%;To learn and understand importance of monitoring SPO2 with pulse  oximeter and demonstrate accurate use of the pulse oximeter.;To learn and demonstrate proper pursed lip breathing techniques or other breathing techniques.        Long  Term Goals Exhibits compliance with exercise, home  and travel O2 prescription;Exhibits proper breathing techniques, such as pursed lip breathing or other method taught during program session;Verbalizes importance of monitoring SPO2 with pulse oximeter and return demonstration;Maintenance of O2 saturations>88%       Goals/Expected Outcomes Compliance and understanding of oxygen saturation monitoring and breathing techniques to decrease shortness of breath.                Oxygen Discharge (Final Oxygen Re-Evaluation):  Oxygen Re-Evaluation - 02/08/23 1205       Program Oxygen Prescription   Program Oxygen Prescription Continuous    Liters per minute 15      Home Oxygen   Home Oxygen Device Portable Concentrator;Home Concentrator   in process of getting tanks   Sleep Oxygen Prescription Continuous    Liters per minute 5    Home Exercise Oxygen Prescription Continuous    Liters per minute 5    Home Resting Oxygen Prescription Continuous    Liters per minute 5    Compliance with Home Oxygen Use Yes      Goals/Expected Outcomes   Short Term Goals To learn and exhibit compliance with exercise, home and travel O2 prescription;To learn and understand importance of maintaining oxygen saturations>88%;To learn and understand importance of monitoring SPO2 with pulse oximeter and demonstrate accurate use of the pulse oximeter.;To learn and demonstrate proper pursed lip breathing techniques or other breathing techniques.     Long  Term Goals Exhibits compliance with exercise, home  and travel O2 prescription;Exhibits proper breathing techniques, such as pursed lip breathing or other method taught during program session;Verbalizes importance of monitoring SPO2 with pulse oximeter and return demonstration;Maintenance of O2  saturations>88%    Goals/Expected Outcomes Compliance and understanding of oxygen saturation monitoring and breathing techniques to decrease shortness of breath.             Initial Exercise Prescription:  Initial Exercise Prescription - 02/01/23 1500       Date of Initial Exercise RX and Referring Provider   Date 02/01/23    Referring Provider Dr. Vassie Loll    Expected Discharge Date 04/29/23      Oxygen   Oxygen Continuous    Liters 6-15    Maintain Oxygen Saturation 88% or higher      Recumbant Bike   Level 1    Minutes 15    METs 1.6      Recumbant Elliptical   Level 1    Minutes 15    METs 1.5      Prescription Details   Frequency (times per week) 2    Duration Progress to 30 minutes of continuous aerobic without signs/symptoms of physical distress      Intensity   THRR 40-80% of Max Heartrate 52-104    Ratings of Perceived Exertion 11-13    Perceived Dyspnea 0-4      Progression   Progression Continue to progress workloads to maintain intensity without signs/symptoms of  physical distress.      Resistance Training   Training Prescription Yes    Weight red bands    Reps 10-15             Perform Capillary Blood Glucose checks as needed.  Exercise Prescription Changes:   Exercise Comments:   Exercise Goals and Review:   Exercise Goals     Row Name 02/01/23 1328             Exercise Goals   Increase Physical Activity Yes       Intervention Provide advice, education, support and counseling about physical activity/exercise needs.;Develop an individualized exercise prescription for aerobic and resistive training based on initial evaluation findings, risk stratification, comorbidities and participant's personal goals.       Expected Outcomes Short Term: Attend rehab on a regular basis to increase amount of physical activity.;Long Term: Exercising regularly at least 3-5 days a week.;Long Term: Add in home exercise to make exercise part of routine  and to increase amount of physical activity.       Increase Strength and Stamina Yes       Intervention Provide advice, education, support and counseling about physical activity/exercise needs.;Develop an individualized exercise prescription for aerobic and resistive training based on initial evaluation findings, risk stratification, comorbidities and participant's personal goals.       Expected Outcomes Short Term: Increase workloads from initial exercise prescription for resistance, speed, and METs.;Short Term: Perform resistance training exercises routinely during rehab and add in resistance training at home;Long Term: Improve cardiorespiratory fitness, muscular endurance and strength as measured by increased METs and functional capacity ( )       Able to understand and use rate of perceived exertion (RPE) scale Yes       Intervention Provide education and explanation on how to use RPE scale       Expected Outcomes Short Term: Able to use RPE daily in rehab to express subjective intensity level;Long Term:  Able to use RPE to guide intensity level when exercising independently       Able to understand and use Dyspnea scale Yes       Intervention Provide education and explanation on how to use Dyspnea scale       Expected Outcomes Short Term: Able to use Dyspnea scale daily in rehab to express subjective sense of shortness of breath during exertion;Long Term: Able to use Dyspnea scale to guide intensity level when exercising independently       Knowledge and understanding of Target Heart Rate Range (THRR) Yes       Intervention Provide education and explanation of THRR including how the numbers were predicted and where they are located for reference       Expected Outcomes Short Term: Able to state/look up THRR;Short Term: Able to use daily as guideline for intensity in rehab;Long Term: Able to use THRR to govern intensity when exercising independently       Understanding of Exercise Prescription  Yes       Intervention Provide education, explanation, and written materials on patient's individual exercise prescription       Expected Outcomes Short Term: Able to explain program exercise prescription;Long Term: Able to explain home exercise prescription to exercise independently                Exercise Goals Re-Evaluation :  Exercise Goals Re-Evaluation     Row Name 02/08/23 [phone]  Exercise Goal Re-Evaluation   Exercise Goals Review Increase Physical Activity;Increase Strength and Stamina;Able to understand and use rate of perceived exertion (RPE) scale;Able to understand and use Dyspnea scale;Knowledge and understanding of Target Heart Rate Range (THRR);Understanding of Exercise Prescription       Comments Pt to begin exercise 12/24. Will monitor for progression.       Expected Outcomes Through exercise at rehab and home, the patient will decrease shortness of breath with daily activities and feel confident in carrying out an exercise regimen at home.                Discharge Exercise Prescription (Final Exercise Prescription Changes):   Nutrition:  Target Goals: Understanding of nutrition guidelines, daily intake of sodium 1500mg , cholesterol 200mg , calories 30% from fat and 7% or less from saturated fats, daily to have 5 or more servings of fruits and vegetables.  Biometrics:  Pre Biometrics - 02/01/23 1420       Pre Biometrics   Grip Strength 21 kg              Nutrition Therapy Plan and Nutrition Goals:   Nutrition Assessments:  MEDIFICTS Score Key: >=70 Need to make dietary changes  40-70 Heart Healthy Diet <= 40 Therapeutic Level Cholesterol Diet   Picture Your Plate Scores: <16 Unhealthy dietary pattern with much room for improvement. 41-50 Dietary pattern unlikely to meet recommendations for good health and room for improvement. 51-60 More healthful dietary pattern, with some room for improvement.  >60 Healthy dietary pattern,  although there may be some specific behaviors that could be improved.    Nutrition Goals Re-Evaluation:   Nutrition Goals Discharge (Final Nutrition Goals Re-Evaluation):   Psychosocial: Target Goals: Acknowledge presence or absence of significant depression and/or stress, maximize coping skills, provide positive support system. Participant is able to verbalize types and ability to use techniques and skills needed for reducing stress and depression.  Initial Review & Psychosocial Screening:  Initial Psych Review & Screening - 02/01/23 1422       Initial Review   Current issues with Current Stress Concerns    Source of Stress Concerns Family      Family Dynamics   Good Support System? Yes      Screening Interventions   Interventions Encouraged to exercise    Expected Outcomes Long Term Goal: Stressors or current issues are controlled or eliminated.;Short Term goal: Identification and review with participant of any Quality of Life or Depression concerns found by scoring the questionnaire.;Long Term goal: The participant improves quality of Life and PHQ9 Scores as seen by post scores and/or verbalization of changes             Quality of Life Scores:  Scores of 19 and below usually indicate a poorer quality of life in these areas.  A difference of  2-3 points is a clinically meaningful difference.  A difference of 2-3 points in the total score of the Quality of Life Index has been associated with significant improvement in overall quality of life, self-image, physical symptoms, and general health in studies assessing change in quality of life.  PHQ-9: Review Flowsheet       02/01/2023  Depression screen PHQ 2/9  Decreased Interest 1  Down, Depressed, Hopeless 1  PHQ - 2 Score 2  Altered sleeping 0  Tired, decreased energy 1  Change in appetite 0  Feeling bad or failure about yourself  0  Trouble concentrating 0  Moving slowly or fidgety/restless 0  Suicidal thoughts 0   PHQ-9 Score 3  Difficult doing work/chores Not difficult at all   Interpretation of Total Score  Total Score Depression Severity:  1-4 = Minimal depression, 5-9 = Mild depression, 10-14 = Moderate depression, 15-19 = Moderately severe depression, 20-27 = Severe depression   Psychosocial Evaluation and Intervention:  Psychosocial Evaluation - 02/01/23 1425       Psychosocial Evaluation & Interventions   Interventions Encouraged to exercise with the program and follow exercise prescription    Comments Tonnie denies any psychosocial concerns at this time. He does state he is worried about his health and has to start helping his wife out more d/t short term memory loss. He denies any needs at this time. He states he has an "over supportive" family.    Expected Outcomes For Keymon to attend Pulm Rehab free of any psychosocial barriers or concerns    Continue Psychosocial Services  No Follow up required             Psychosocial Re-Evaluation:  Psychosocial Re-Evaluation     Row Name 02/03/23 1225             Psychosocial Re-Evaluation   Comments No changes since orientation. Athan is scheduled to start the program on 02/11/23.       Expected Outcomes For Kayden to decrease stress and attend PR free of any psy/soc barriers or concerns       Interventions Encouraged to attend Pulmonary Rehabilitation for the exercise       Continue Psychosocial Services  No Follow up required                Psychosocial Discharge (Final Psychosocial Re-Evaluation):  Psychosocial Re-Evaluation - 02/03/23 1225       Psychosocial Re-Evaluation   Comments No changes since orientation. Caedin is scheduled to start the program on 02/11/23.    Expected Outcomes For Cristofher to decrease stress and attend PR free of any psy/soc barriers or concerns    Interventions Encouraged to attend Pulmonary Rehabilitation for the exercise    Continue Psychosocial Services  No Follow up required              Education: Education Goals: Education classes will be provided on a weekly basis, covering required topics. Participant will state understanding/return demonstration of topics presented.  Learning Barriers/Preferences:  Learning Barriers/Preferences - 02/01/23 1540       Learning Barriers/Preferences   Learning Barriers Hearing    Learning Preferences Individual Instruction;Group Instruction;Written Material             Education Topics: Know Your Numbers Group instruction that is supported by a PowerPoint presentation. Instructor discusses importance of knowing and understanding resting, exercise, and post-exercise oxygen saturation, heart rate, and blood pressure. Oxygen saturation, heart rate, blood pressure, rating of perceived exertion, and dyspnea are reviewed along with a normal range for these values.    Exercise for the Pulmonary Patient Group instruction that is supported by a PowerPoint presentation. Instructor discusses benefits of exercise, core components of exercise, frequency, duration, and intensity of an exercise routine, importance of utilizing pulse oximetry during exercise, safety while exercising, and options of places to exercise outside of rehab.    MET Level  Group instruction provided by PowerPoint, verbal discussion, and written material to support subject matter. Instructor reviews what METs are and how to increase METs.    Pulmonary Medications Verbally interactive group education provided by instructor with focus on inhaled medications and proper administration.  Anatomy and Physiology of the Respiratory System Group instruction provided by PowerPoint, verbal discussion, and written material to support subject matter. Instructor reviews respiratory cycle and anatomical components of the respiratory system and their functions. Instructor also reviews differences in obstructive and restrictive respiratory diseases with examples of each.     Oxygen Safety Group instruction provided by PowerPoint, verbal discussion, and written material to support subject matter. There is an overview of "What is Oxygen" and "Why do we need it".  Instructor also reviews how to create a safe environment for oxygen use, the importance of using oxygen as prescribed, and the risks of noncompliance. There is a brief discussion on traveling with oxygen and resources the patient may utilize.   Oxygen Use Group instruction provided by PowerPoint, verbal discussion, and written material to discuss how supplemental oxygen is prescribed and different types of oxygen supply systems. Resources for more information are provided.    Breathing Techniques Group instruction that is supported by demonstration and informational handouts. Instructor discusses the benefits of pursed lip and diaphragmatic breathing and detailed demonstration on how to perform both.     Risk Factor Reduction Group instruction that is supported by a PowerPoint presentation. Instructor discusses the definition of a risk factor, different risk factors for pulmonary disease, and how the heart and lungs work together.   Pulmonary Diseases Group instruction provided by PowerPoint, verbal discussion, and written material to support subject matter. Instructor gives an overview of the different type of pulmonary diseases. There is also a discussion on risk factors and symptoms as well as ways to manage the diseases.   Stress and Energy Conservation Group instruction provided by PowerPoint, verbal discussion, and written material to support subject matter. Instructor gives an overview of stress and the impact it can have on the body. Instructor also reviews ways to reduce stress. There is also a discussion on energy conservation and ways to conserve energy throughout the day.   Warning Signs and Symptoms Group instruction provided by PowerPoint, verbal discussion, and written material to  support subject matter. Instructor reviews warning signs and symptoms of stroke, heart attack, cold and flu. Instructor also reviews ways to prevent the spread of infection.   Other Education Group or individual verbal, written, or video instructions that support the educational goals of the pulmonary rehab program.    Knowledge Questionnaire Score:  Knowledge Questionnaire Score - 02/01/23 1524       Knowledge Questionnaire Score   Pre Score 16/18             Core Components/Risk Factors/Patient Goals at Admission:  Personal Goals and Risk Factors at Admission - 02/01/23 1330       Core Components/Risk Factors/Patient Goals on Admission   Improve shortness of breath with ADL's Yes    Intervention Provide education, individualized exercise plan and daily activity instruction to help decrease symptoms of SOB with activities of daily living.    Expected Outcomes Short Term: Improve cardiorespiratory fitness to achieve a reduction of symptoms when performing ADLs             Core Components/Risk Factors/Patient Goals Review:   Goals and Risk Factor Review     Row Name 02/03/23 1228             Core Components/Risk Factors/Patient Goals Review   Personal Goals Review Improve shortness of breath with ADL's;Develop more efficient breathing techniques such as purse lipped breathing and diaphragmatic breathing and practicing self-pacing with activity.  Review Unable to assess goals. Filadelfio is scheduled to start the program on 02/11/23       Expected Outcomes For Tashon to improve his shortness of breath with ADLs and develop more efficient breathing techniques such as purse lipped breathing and diaphragmatic breathing; and practicing self-pacing activities.                Core Components/Risk Factors/Patient Goals at Discharge (Final Review):   Goals and Risk Factor Review - 02/03/23 1228       Core Components/Risk Factors/Patient Goals Review   Personal Goals  Review Improve shortness of breath with ADL's;Develop more efficient breathing techniques such as purse lipped breathing and diaphragmatic breathing and practicing self-pacing with activity.    Review Unable to assess goals. Henny is scheduled to start the program on 02/11/23    Expected Outcomes For Gauge to improve his shortness of breath with ADLs and develop more efficient breathing techniques such as purse lipped breathing and diaphragmatic breathing; and practicing self-pacing activities.             ITP Comments: Pt has not started pulm rehab yet. He is scheduled to start on 02/11/23.     Comments: Dr. Mechele Collin is Medical Director for Pulmonary Rehab at [facility].

## 2023-02-09 NOTE — Progress Notes (Signed)
Pulmonary Individual Treatment Plan  Patient Details  Name: Marc Gilbert MRN: [id number] Date of Birth: 1930/10/15 Referring Provider:   Doristine Devoid Pulmonary Rehab Walk Test from 02/01/2023 in [facility]  Referring Provider Dr. Vassie Loll       Initial Encounter Date:  Flowsheet Row Pulmonary Rehab Walk Test from 02/01/2023 in [facility]  Date 02/01/23       Visit Diagnosis: ILD (interstitial lung disease) (HCC)  Patient's Home Medications on Admission:   Current Outpatient Medications:    acetaminophen (TYLENOL) 500 MG tablet, Take 1,000 mg by mouth every 6 (six) hours as needed for mild pain or moderate pain (for pain)., Disp: , Rfl:    apixaban (ELIQUIS) 5 MG TABS tablet, Take 1 tablet (5 mg total) by mouth 2 (two) times daily., Disp: 60 tablet, Rfl: 6   atorvastatin (LIPITOR) 40 MG tablet, Take 1 tablet (40 mg total) by mouth at bedtime., Disp: 90 tablet, Rfl: 3   carvedilol (COREG) 3.125 MG tablet, Take 1 tablet (3.125 mg total) by mouth 2 (two) times daily., Disp: 180 tablet, Rfl: 3   diclofenac Sodium (VOLTAREN) 1 % GEL, Apply 1 application  topically See admin instructions. Apply 2-4 grams of gel topically to both knees once a day, Disp: , Rfl:    docusate sodium (COLACE) 250 MG capsule, Take 250 mg by mouth daily., Disp: , Rfl:    empagliflozin (JARDIANCE) 10 MG TABS tablet, Take 1 tablet (10 mg total) by mouth daily., Disp: 90 tablet, Rfl: 3   gabapentin (NEURONTIN) 300 MG capsule, Take 300-600 mg by mouth See admin instructions. Take 300 mg by mouth between 3 AM-4 AM, 600 mg by mouth after breakfast, 600 mg between 3 PM-4 PM, and 300 mg at bedtime, Disp: , Rfl:    Glycerin-Hypromellose-PEG 400 (DRY EYE RELIEF DROPS) 0.2-0.2-1 % SOLN, Place 1 drop into both eyes 3 (three) times daily as needed (for dryness)., Disp: , Rfl:    guaiFENesin (MUCINEX) 600 MG 12  hr tablet, Take by mouth 2 (two) times daily., Disp: , Rfl:    levothyroxine (SYNTHROID) 50 MCG tablet, Take 50 mcg by mouth daily before breakfast., Disp: , Rfl:    Melatonin 10 MG TABS, Take 10 mg by mouth at bedtime., Disp: , Rfl:    Multiple Vitamins-Minerals (ONE-A-DAY MENS 50+ ADVANTAGE) TABS, Take 1 tablet by mouth daily with breakfast., Disp: , Rfl:    Omega-3 Fatty Acids (FISH OIL OMEGA-3 PO), Take 1,960 mg by mouth daily., Disp: , Rfl:    oxybutynin (DITROPAN) 5 MG tablet, Take 5 mg by mouth at bedtime., Disp: , Rfl:    polyethylene glycol (MIRALAX / GLYCOLAX) 17 g packet, Take 8.5 g by mouth daily as needed for mild constipation or moderate constipation., Disp: , Rfl:    sodium chloride (OCEAN) 0.65 % SOLN nasal spray, Place 1 spray into both nostrils as needed for congestion., Disp: , Rfl:    SPIRIVA RESPIMAT 2.5 MCG/ACT AERS, SMARTSIG:2 Puff(s) Via Inhaler Daily, Disp: , Rfl:    Tamsulosin HCl (FLOMAX) 0.4 MG CAPS, Take 0.4 mg by mouth at bedtime., Disp: , Rfl:    testosterone cypionate (DEPOTESTOSTERONE CYPIONATE) 200 MG/ML injection, Inject 66.67 mg into the skin every Wednesday., Disp: , Rfl:    albuterol (PROVENTIL) (5 MG/ML) 0.5% nebulizer solution, Take 0.5 mLs (2.5 mg total) by nebulization every 6 (six) hours as needed for wheezing  or shortness of breath., Disp: 20 mL, Rfl: 12   furosemide (LASIX) 20 MG tablet, Take 1 tablet (20 mg total) by mouth daily for 10 days. (Patient taking differently: Take 20 mg by mouth daily as needed for fluid or edema.), Disp: 30 tablet, Rfl: 0   ondansetron (ZOFRAN) 4 MG tablet, Take 1 tablet (4 mg total) by mouth every 8 (eight) hours as needed for nausea or vomiting. (Patient not taking: Reported on 02/01/2023), Disp: 20 tablet, Rfl: 0   sodium chloride HYPERTONIC 3 % nebulizer solution, Take by nebulization daily as needed for other., Disp: 750 mL, Rfl: 12  Past Medical History: Past Medical History:  Diagnosis Date   Arthritis    "right  hand; back" (08/30/2015)   Cancer (HCC)    skin  squamous and basal   Chronic lower back pain    CKD (chronic kidney disease), stage III (HCC)    Stage III   COPD (chronic obstructive pulmonary disease) (HCC)    no home O2   Coronary artery disease    Dysplastic colon polyp age 30   carcinoma in situ   GERD (gastroesophageal reflux disease)    occ   H/O hiatal hernia    Hyperlipidemia    Hypertension    Prostate cancer (HCC) 07/2014   active surveillance, [place] 6    Tobacco Use: Social History   Tobacco Use  Smoking Status Former   Current packs/day: 0.00   Average packs/day: 0.5 packs/day for 25.0 years (12.5 ttl pk-yrs)   Types: Cigarettes   Start date: 05/25/1963   Quit date: 05/24/1988   Years since quitting: 34.7  Smokeless Tobacco Never    Labs: Review Flowsheet       Latest Ref Rng & Units 08/10/2016 08/14/2016 08/15/2016 10/30/2022  Labs for ITP Cardiac and Pulmonary Rehab  Cholestrol 0 - 200 mg/dL 161  - - -  LDL (calc) 0 - 99 mg/dL 87  - - -  HDL-C >09 mg/dL 56  - - -  Trlycerides <150 mg/dL 72  - - -  Hemoglobin U0A 4.8 - 5.6 % 5.7  - - -  PH, Arterial 7.350 - 7.450 - 7.338  7.341  7.387  7.472  7.349  - -  PCO2 arterial 32.0 - 48.0 mmHg - 37.1  34.8  37.6  35.8  49.8  - -  Bicarbonate 20.0 - 28.0 mmol/L - 19.9  18.8  22.9  26.2  27.4  - 27.8   TCO2 0 - 100 mmol/L - 21  20  20  24  27  28  29  28  27  27  25   -  Acid-base deficit 0.0 - 2.0 mmol/L - 5.0  6.0  2.0  - -  O2 Saturation % - 96.0  96.0  99.0  100.0  100.0  - 32     Details       Multiple values from one day are sorted in reverse-chronological order         Capillary Blood Glucose: Lab Results  Component Value Date   GLUCAP 147 (H) 08/16/2016   GLUCAP 128 (H) 08/16/2016   GLUCAP 124 (H) 08/16/2016   GLUCAP 110 (H) 08/16/2016   GLUCAP 114 (H) 08/16/2016     Pulmonary Assessment Scores:  Pulmonary Assessment Scores     Row Name 02/01/23 1514         ADL UCSD   ADL Phase Entry      SOB Score total 38  CAT Score   CAT Score 11       mMRC Score   mMRC Score 3             UCSD: Self-administered rating of dyspnea associated with activities of daily living (ADLs) 6-point scale (0 = "not at all" to 5 = "maximal or unable to do because of breathlessness")  Scoring Scores range from 0 to 120.  Minimally important difference is 5 units  CAT: CAT can identify the health impairment of COPD patients and is better correlated with disease progression.  CAT has a scoring range of zero to 40. The CAT score is classified into four groups of low (less than 10), medium (10 - 20), high (21-30) and very high (31-40) based on the impact level of disease on health status. A CAT score over 10 suggests significant symptoms.  A worsening CAT score could be explained by an exacerbation, poor medication adherence, poor inhaler technique, or progression of COPD or comorbid conditions.  CAT MCID is 2 points  mMRC: mMRC (Modified Medical Research Council) Dyspnea Scale is used to assess the degree of baseline functional disability in patients of respiratory disease due to dyspnea. No minimal important difference is established. A decrease in score of 1 point or greater is considered a positive change.   Pulmonary Function Assessment:  Pulmonary Function Assessment - 02/01/23 1329       Breath   Bilateral Breath Sounds Decreased    Shortness of Breath Yes;Limiting activity;Fear of Shortness of Breath             Exercise Target Goals: Exercise Program Goal: Individual exercise prescription set using results from initial 6 min walk test and THRR while considering  patient's activity barriers and safety.   Exercise Prescription Goal: Initial exercise prescription builds to 30-45 minutes a day of aerobic activity, 2-3 days per week.  Home exercise guidelines will be given to patient during program as part of exercise prescription that the participant will  acknowledge.  Activity Barriers & Risk Stratification:  Activity Barriers & Cardiac Risk Stratification - 02/01/23 1328       Activity Barriers & Cardiac Risk Stratification   Activity Barriers Deconditioning;Muscular Weakness;Shortness of Breath;Assistive Device             6 Minute Walk:  6 Minute Walk     Row Name 02/01/23 1512         6 Minute Walk   Phase Initial     Distance 600 feet     Walk Time 6 minutes     # of Rest Breaks 2     MPH 1.14     METS 0.55     RPE 12     Perceived Dyspnea  1     VO2 Peak 1.93     Symptoms No     Resting HR 84 bpm     Resting BP 106/60     Resting Oxygen Saturation  98 %     Exercise Oxygen Saturation  during 6 min walk 86 %     Max Ex. HR 111 bpm     Max Ex. BP 116/70     2 Minute Post BP 104/54       Interval HR   1 Minute HR 85     2 Minute HR 91     3 Minute HR 111     4 Minute HR 99     5 Minute HR 104  6 Minute HR 104     2 Minute Post HR 89     Interval Heart Rate? Yes       Interval Oxygen   Interval Oxygen? Yes     Baseline Oxygen Saturation % 98 %     1 Minute Oxygen Saturation % 95 %     1 Minute Liters of Oxygen 6 L     2 Minute Oxygen Saturation % 92 %     2 Minute Liters of Oxygen 6 L     3 Minute Oxygen Saturation % 86 %  2:30-2:50 increased O2 to 8L     3 Minute Liters of Oxygen 8 L     4 Minute Oxygen Saturation % 86 %  3:30-4:00 increased O2 to 10L     4 Minute Liters of Oxygen 10 L     5 Minute Oxygen Saturation % 91 %     5 Minute Liters of Oxygen 15 L     6 Minute Oxygen Saturation % 93 %     6 Minute Liters of Oxygen 15 L     2 Minute Post Oxygen Saturation % 95 %     2 Minute Post Liters of Oxygen 6 L              Oxygen Initial Assessment:  Oxygen Initial Assessment - 02/01/23 1328       Home Oxygen   Home Oxygen Device Portable Concentrator;Home Concentrator   in process of getting tanks   Sleep Oxygen Prescription Continuous    Liters per minute 5    Home Exercise  Oxygen Prescription Continuous    Liters per minute 5    Home Resting Oxygen Prescription Continuous    Liters per minute 5    Compliance with Home Oxygen Use Yes      Initial 6 min Walk   Oxygen Used Continuous    Liters per minute 15      Program Oxygen Prescription   Program Oxygen Prescription Continuous    Liters per minute 15      Intervention   Short Term Goals To learn and exhibit compliance with exercise, home and travel O2 prescription;To learn and understand importance of maintaining oxygen saturations>88%;To learn and understand importance of monitoring SPO2 with pulse oximeter and demonstrate accurate use of the pulse oximeter.;To learn and demonstrate proper pursed lip breathing techniques or other breathing techniques.     Long  Term Goals Exhibits compliance with exercise, home  and travel O2 prescription;Exhibits proper breathing techniques, such as pursed lip breathing or other method taught during program session;Verbalizes importance of monitoring SPO2 with pulse oximeter and return demonstration;Maintenance of O2 saturations>88%             Oxygen Re-Evaluation:  Oxygen Re-Evaluation     Row Name 02/08/23 1205             Program Oxygen Prescription   Program Oxygen Prescription Continuous       Liters per minute 15         Home Oxygen   Home Oxygen Device Portable Concentrator;Home Concentrator  in process of getting tanks       Sleep Oxygen Prescription Continuous       Liters per minute 5       Home Exercise Oxygen Prescription Continuous       Liters per minute 5       Home Resting Oxygen Prescription Continuous       Liters per  minute 5       Compliance with Home Oxygen Use Yes         Goals/Expected Outcomes   Short Term Goals To learn and exhibit compliance with exercise, home and travel O2 prescription;To learn and understand importance of maintaining oxygen saturations>88%;To learn and understand importance of monitoring SPO2 with pulse  oximeter and demonstrate accurate use of the pulse oximeter.;To learn and demonstrate proper pursed lip breathing techniques or other breathing techniques.        Long  Term Goals Exhibits compliance with exercise, home  and travel O2 prescription;Exhibits proper breathing techniques, such as pursed lip breathing or other method taught during program session;Verbalizes importance of monitoring SPO2 with pulse oximeter and return demonstration;Maintenance of O2 saturations>88%       Goals/Expected Outcomes Compliance and understanding of oxygen saturation monitoring and breathing techniques to decrease shortness of breath.                Oxygen Discharge (Final Oxygen Re-Evaluation):  Oxygen Re-Evaluation - 02/08/23 1205       Program Oxygen Prescription   Program Oxygen Prescription Continuous    Liters per minute 15      Home Oxygen   Home Oxygen Device Portable Concentrator;Home Concentrator   in process of getting tanks   Sleep Oxygen Prescription Continuous    Liters per minute 5    Home Exercise Oxygen Prescription Continuous    Liters per minute 5    Home Resting Oxygen Prescription Continuous    Liters per minute 5    Compliance with Home Oxygen Use Yes      Goals/Expected Outcomes   Short Term Goals To learn and exhibit compliance with exercise, home and travel O2 prescription;To learn and understand importance of maintaining oxygen saturations>88%;To learn and understand importance of monitoring SPO2 with pulse oximeter and demonstrate accurate use of the pulse oximeter.;To learn and demonstrate proper pursed lip breathing techniques or other breathing techniques.     Long  Term Goals Exhibits compliance with exercise, home  and travel O2 prescription;Exhibits proper breathing techniques, such as pursed lip breathing or other method taught during program session;Verbalizes importance of monitoring SPO2 with pulse oximeter and return demonstration;Maintenance of O2  saturations>88%    Goals/Expected Outcomes Compliance and understanding of oxygen saturation monitoring and breathing techniques to decrease shortness of breath.             Initial Exercise Prescription:  Initial Exercise Prescription - 02/01/23 1500       Date of Initial Exercise RX and Referring Provider   Date 02/01/23    Referring Provider Dr. Vassie Loll    Expected Discharge Date 04/29/23      Oxygen   Oxygen Continuous    Liters 6-15    Maintain Oxygen Saturation 88% or higher      Recumbant Bike   Level 1    Minutes 15    METs 1.6      Recumbant Elliptical   Level 1    Minutes 15    METs 1.5      Prescription Details   Frequency (times per week) 2    Duration Progress to 30 minutes of continuous aerobic without signs/symptoms of physical distress      Intensity   THRR 40-80% of Max Heartrate 52-104    Ratings of Perceived Exertion 11-13    Perceived Dyspnea 0-4      Progression   Progression Continue to progress workloads to maintain intensity without signs/symptoms of  physical distress.      Resistance Training   Training Prescription Yes    Weight red bands    Reps 10-15             Perform Capillary Blood Glucose checks as needed.  Exercise Prescription Changes:   Exercise Comments:   Exercise Goals and Review:   Exercise Goals     Row Name 02/01/23 1328             Exercise Goals   Increase Physical Activity Yes       Intervention Provide advice, education, support and counseling about physical activity/exercise needs.;Develop an individualized exercise prescription for aerobic and resistive training based on initial evaluation findings, risk stratification, comorbidities and participant's personal goals.       Expected Outcomes Short Term: Attend rehab on a regular basis to increase amount of physical activity.;Long Term: Exercising regularly at least 3-5 days a week.;Long Term: Add in home exercise to make exercise part of routine  and to increase amount of physical activity.       Increase Strength and Stamina Yes       Intervention Provide advice, education, support and counseling about physical activity/exercise needs.;Develop an individualized exercise prescription for aerobic and resistive training based on initial evaluation findings, risk stratification, comorbidities and participant's personal goals.       Expected Outcomes Short Term: Increase workloads from initial exercise prescription for resistance, speed, and METs.;Short Term: Perform resistance training exercises routinely during rehab and add in resistance training at home;Long Term: Improve cardiorespiratory fitness, muscular endurance and strength as measured by increased METs and functional capacity ( )       Able to understand and use rate of perceived exertion (RPE) scale Yes       Intervention Provide education and explanation on how to use RPE scale       Expected Outcomes Short Term: Able to use RPE daily in rehab to express subjective intensity level;Long Term:  Able to use RPE to guide intensity level when exercising independently       Able to understand and use Dyspnea scale Yes       Intervention Provide education and explanation on how to use Dyspnea scale       Expected Outcomes Short Term: Able to use Dyspnea scale daily in rehab to express subjective sense of shortness of breath during exertion;Long Term: Able to use Dyspnea scale to guide intensity level when exercising independently       Knowledge and understanding of Target Heart Rate Range (THRR) Yes       Intervention Provide education and explanation of THRR including how the numbers were predicted and where they are located for reference       Expected Outcomes Short Term: Able to state/look up THRR;Short Term: Able to use daily as guideline for intensity in rehab;Long Term: Able to use THRR to govern intensity when exercising independently       Understanding of Exercise Prescription  Yes       Intervention Provide education, explanation, and written materials on patient's individual exercise prescription       Expected Outcomes Short Term: Able to explain program exercise prescription;Long Term: Able to explain home exercise prescription to exercise independently                Exercise Goals Re-Evaluation :  Exercise Goals Re-Evaluation     Row Name 02/08/23 [phone]  Exercise Goal Re-Evaluation   Exercise Goals Review Increase Physical Activity;Increase Strength and Stamina;Able to understand and use rate of perceived exertion (RPE) scale;Able to understand and use Dyspnea scale;Knowledge and understanding of Target Heart Rate Range (THRR);Understanding of Exercise Prescription       Comments Pt to begin exercise 12/24. Will monitor for progression.       Expected Outcomes Through exercise at rehab and home, the patient will decrease shortness of breath with daily activities and feel confident in carrying out an exercise regimen at home.                Discharge Exercise Prescription (Final Exercise Prescription Changes):   Nutrition:  Target Goals: Understanding of nutrition guidelines, daily intake of sodium 1500mg , cholesterol 200mg , calories 30% from fat and 7% or less from saturated fats, daily to have 5 or more servings of fruits and vegetables.  Biometrics:  Pre Biometrics - 02/01/23 1420       Pre Biometrics   Grip Strength 21 kg              Nutrition Therapy Plan and Nutrition Goals:   Nutrition Assessments:  MEDIFICTS Score Key: >=70 Need to make dietary changes  40-70 Heart Healthy Diet <= 40 Therapeutic Level Cholesterol Diet   Picture Your Plate Scores: <16 Unhealthy dietary pattern with much room for improvement. 41-50 Dietary pattern unlikely to meet recommendations for good health and room for improvement. 51-60 More healthful dietary pattern, with some room for improvement.  >60 Healthy dietary pattern,  although there may be some specific behaviors that could be improved.    Nutrition Goals Re-Evaluation:   Nutrition Goals Discharge (Final Nutrition Goals Re-Evaluation):   Psychosocial: Target Goals: Acknowledge presence or absence of significant depression and/or stress, maximize coping skills, provide positive support system. Participant is able to verbalize types and ability to use techniques and skills needed for reducing stress and depression.  Initial Review & Psychosocial Screening:  Initial Psych Review & Screening - 02/01/23 1422       Initial Review   Current issues with Current Stress Concerns    Source of Stress Concerns Family      Family Dynamics   Good Support System? Yes      Screening Interventions   Interventions Encouraged to exercise    Expected Outcomes Long Term Goal: Stressors or current issues are controlled or eliminated.;Short Term goal: Identification and review with participant of any Quality of Life or Depression concerns found by scoring the questionnaire.;Long Term goal: The participant improves quality of Life and PHQ9 Scores as seen by post scores and/or verbalization of changes             Quality of Life Scores:  Scores of 19 and below usually indicate a poorer quality of life in these areas.  A difference of  2-3 points is a clinically meaningful difference.  A difference of 2-3 points in the total score of the Quality of Life Index has been associated with significant improvement in overall quality of life, self-image, physical symptoms, and general health in studies assessing change in quality of life.  PHQ-9: Review Flowsheet       02/01/2023  Depression screen PHQ 2/9  Decreased Interest 1  Down, Depressed, Hopeless 1  PHQ - 2 Score 2  Altered sleeping 0  Tired, decreased energy 1  Change in appetite 0  Feeling bad or failure about yourself  0  Trouble concentrating 0  Moving slowly or fidgety/restless 0  Suicidal thoughts 0   PHQ-9 Score 3  Difficult doing work/chores Not difficult at all   Interpretation of Total Score  Total Score Depression Severity:  1-4 = Minimal depression, 5-9 = Mild depression, 10-14 = Moderate depression, 15-19 = Moderately severe depression, 20-27 = Severe depression   Psychosocial Evaluation and Intervention:  Psychosocial Evaluation - 02/01/23 1425       Psychosocial Evaluation & Interventions   Interventions Encouraged to exercise with the program and follow exercise prescription    Comments Josede denies any psychosocial concerns at this time. He does state he is worried about his health and has to start helping his wife out more d/t short term memory loss. He denies any needs at this time. He states he has an "over supportive" family.    Expected Outcomes For Thornton to attend Pulm Rehab free of any psychosocial barriers or concerns    Continue Psychosocial Services  No Follow up required             Psychosocial Re-Evaluation:  Psychosocial Re-Evaluation     Row Name 02/03/23 1225             Psychosocial Re-Evaluation   Comments No changes since orientation. Jehad is scheduled to start the program on 02/11/23.       Expected Outcomes For Mycal to decrease stress and attend PR free of any psy/soc barriers or concerns       Interventions Encouraged to attend Pulmonary Rehabilitation for the exercise       Continue Psychosocial Services  No Follow up required                Psychosocial Discharge (Final Psychosocial Re-Evaluation):  Psychosocial Re-Evaluation - 02/03/23 1225       Psychosocial Re-Evaluation   Comments No changes since orientation. Vard is scheduled to start the program on 02/11/23.    Expected Outcomes For Robben to decrease stress and attend PR free of any psy/soc barriers or concerns    Interventions Encouraged to attend Pulmonary Rehabilitation for the exercise    Continue Psychosocial Services  No Follow up required              Education: Education Goals: Education classes will be provided on a weekly basis, covering required topics. Participant will state understanding/return demonstration of topics presented.  Learning Barriers/Preferences:  Learning Barriers/Preferences - 02/01/23 1540       Learning Barriers/Preferences   Learning Barriers Hearing    Learning Preferences Individual Instruction;Group Instruction;Written Material             Education Topics: Know Your Numbers Group instruction that is supported by a PowerPoint presentation. Instructor discusses importance of knowing and understanding resting, exercise, and post-exercise oxygen saturation, heart rate, and blood pressure. Oxygen saturation, heart rate, blood pressure, rating of perceived exertion, and dyspnea are reviewed along with a normal range for these values.    Exercise for the Pulmonary Patient Group instruction that is supported by a PowerPoint presentation. Instructor discusses benefits of exercise, core components of exercise, frequency, duration, and intensity of an exercise routine, importance of utilizing pulse oximetry during exercise, safety while exercising, and options of places to exercise outside of rehab.    MET Level  Group instruction provided by PowerPoint, verbal discussion, and written material to support subject matter. Instructor reviews what METs are and how to increase METs.    Pulmonary Medications Verbally interactive group education provided by instructor with focus on inhaled medications and proper administration.  Anatomy and Physiology of the Respiratory System Group instruction provided by PowerPoint, verbal discussion, and written material to support subject matter. Instructor reviews respiratory cycle and anatomical components of the respiratory system and their functions. Instructor also reviews differences in obstructive and restrictive respiratory diseases with examples of each.     Oxygen Safety Group instruction provided by PowerPoint, verbal discussion, and written material to support subject matter. There is an overview of "What is Oxygen" and "Why do we need it".  Instructor also reviews how to create a safe environment for oxygen use, the importance of using oxygen as prescribed, and the risks of noncompliance. There is a brief discussion on traveling with oxygen and resources the patient may utilize.   Oxygen Use Group instruction provided by PowerPoint, verbal discussion, and written material to discuss how supplemental oxygen is prescribed and different types of oxygen supply systems. Resources for more information are provided.    Breathing Techniques Group instruction that is supported by demonstration and informational handouts. Instructor discusses the benefits of pursed lip and diaphragmatic breathing and detailed demonstration on how to perform both.     Risk Factor Reduction Group instruction that is supported by a PowerPoint presentation. Instructor discusses the definition of a risk factor, different risk factors for pulmonary disease, and how the heart and lungs work together.   Pulmonary Diseases Group instruction provided by PowerPoint, verbal discussion, and written material to support subject matter. Instructor gives an overview of the different type of pulmonary diseases. There is also a discussion on risk factors and symptoms as well as ways to manage the diseases.   Stress and Energy Conservation Group instruction provided by PowerPoint, verbal discussion, and written material to support subject matter. Instructor gives an overview of stress and the impact it can have on the body. Instructor also reviews ways to reduce stress. There is also a discussion on energy conservation and ways to conserve energy throughout the day.   Warning Signs and Symptoms Group instruction provided by PowerPoint, verbal discussion, and written material to  support subject matter. Instructor reviews warning signs and symptoms of stroke, heart attack, cold and flu. Instructor also reviews ways to prevent the spread of infection.   Other Education Group or individual verbal, written, or video instructions that support the educational goals of the pulmonary rehab program.    Knowledge Questionnaire Score:  Knowledge Questionnaire Score - 02/01/23 1524       Knowledge Questionnaire Score   Pre Score 16/18             Core Components/Risk Factors/Patient Goals at Admission:  Personal Goals and Risk Factors at Admission - 02/01/23 1330       Core Components/Risk Factors/Patient Goals on Admission   Improve shortness of breath with ADL's Yes    Intervention Provide education, individualized exercise plan and daily activity instruction to help decrease symptoms of SOB with activities of daily living.    Expected Outcomes Short Term: Improve cardiorespiratory fitness to achieve a reduction of symptoms when performing ADLs             Core Components/Risk Factors/Patient Goals Review:   Goals and Risk Factor Review     Row Name 02/03/23 1228             Core Components/Risk Factors/Patient Goals Review   Personal Goals Review Improve shortness of breath with ADL's;Develop more efficient breathing techniques such as purse lipped breathing and diaphragmatic breathing and practicing self-pacing with activity.  Review Unable to assess goals. Elisha is scheduled to start the program on 02/11/23       Expected Outcomes For Xzavier to improve his shortness of breath with ADLs and develop more efficient breathing techniques such as purse lipped breathing and diaphragmatic breathing; and practicing self-pacing activities.                Core Components/Risk Factors/Patient Goals at Discharge (Final Review):   Goals and Risk Factor Review - 02/03/23 1228       Core Components/Risk Factors/Patient Goals Review   Personal Goals  Review Improve shortness of breath with ADL's;Develop more efficient breathing techniques such as purse lipped breathing and diaphragmatic breathing and practicing self-pacing with activity.    Review Unable to assess goals. Eulan is scheduled to start the program on 02/11/23    Expected Outcomes For Camaron to improve his shortness of breath with ADLs and develop more efficient breathing techniques such as purse lipped breathing and diaphragmatic breathing; and practicing self-pacing activities.             ITP Comments:Pt is making expected progress toward Pulmonary Rehab goals after completing 0 session(s). Recommend continued exercise, life style modification, education, and utilization of breathing techniques to increase stamina and strength, while also decreasing shortness of breath with exertion.  Dr. Mechele Collin is Medical Director for Pulmonary Rehab at [facility].

## 2023-02-11 ENCOUNTER — Telehealth: Payer: Self-pay | Admitting: Pulmonary Disease

## 2023-02-11 ENCOUNTER — Encounter (HOSPITAL_COMMUNITY)
Admission: RE | Admit: 2023-02-11 | Discharge: 2023-02-11 | Disposition: A | Discharge status: Home / Self Care | Payer: Medicare Other | Source: Ambulatory Visit | Attending: Pulmonary Disease | Admitting: Pulmonary Disease

## 2023-02-11 DIAGNOSIS — J849 Interstitial pulmonary disease, unspecified: Secondary | ICD-10-CM | POA: Diagnosis not present

## 2023-02-11 NOTE — Telephone Encounter (Signed)
Patient called [employer] stating that we were to send stationary concetrator with higher liter flow and a face mask for his nebulizer. LOV 01/25/23. There is no dictation of this in the notes. Routing to both [facility] and Clinical research associate. Please send order and notes to them. Fax: [phone]

## 2023-02-11 NOTE — Progress Notes (Signed)
Daily Session Note  Patient Details  Name: Marc Gilbert MRN: [id number] Date of Birth: 10-03-1930 Referring Provider:   Doristine Devoid Pulmonary Rehab Walk Test from 02/01/2023 in [facility] for Heart, Vascular, & Lung Health  Referring Provider Dr. Vassie Loll       Encounter Date: 02/11/2023  Check In:  Session Check In - 02/11/23 1330       Check-In   Supervising physician immediately available to respond to emergencies [facility] MD immediately available    Physician(s) Reather Littler, NP    Location [facility]    Staff Present Essie Hart, RN, BSN;Claudetta Sallie BS, ACSM-CEP, Exercise Physiologist;Casey Katrinka Blazing, RT    Virtual Visit No    Medication changes reported     No    Fall or balance concerns reported    Yes    Comments uses walker    Tobacco Cessation No Change    Warm-up and Cool-down Performed as group-led instruction   only   Resistance Training Performed Yes    VAD Patient? No    PAD/SET Patient? No      Pain Assessment   Currently in Pain? No/denies    Multiple Pain Sites No             Capillary Blood Glucose: No results found for this or any previous visit (from the past 24 hours).    Social History   Tobacco Use  Smoking Status Former   Current packs/day: 0.00   Average packs/day: 0.5 packs/day for 25.0 years (12.5 ttl pk-yrs)   Types: Cigarettes   Start date: 05/25/1963   Quit date: 05/24/1988   Years since quitting: 34.7  Smokeless Tobacco Never    Goals Met:  Exercise tolerated well No report of concerns or symptoms today Strength training completed today  Goals Unmet:  Not Applicable  Comments: Service time is from 1305 to 1439.    Dr. Mechele Collin is Medical Director for Pulmonary Rehab at [facility].

## 2023-02-12 NOTE — Telephone Encounter (Signed)
[employer] needs face to face notes and perception for 10 liter concentrator and nebulizer. [phone]

## 2023-02-16 ENCOUNTER — Encounter (HOSPITAL_COMMUNITY)
Admission: RE | Admit: 2023-02-16 | Discharge: 2023-02-16 | Disposition: A | Discharge status: Home / Self Care | Payer: Medicare Other | Source: Ambulatory Visit | Attending: Pulmonary Disease

## 2023-02-16 DIAGNOSIS — J849 Interstitial pulmonary disease, unspecified: Secondary | ICD-10-CM | POA: Diagnosis not present

## 2023-02-16 NOTE — Progress Notes (Signed)
 Daily Session Note  Patient Details  Name: Marc Gilbert MRN: [id number] Date of Birth: 1930/10/13 Referring Provider:   Conrad Ports Pulmonary Rehab Walk Test from 02/01/2023 in [facility] for Heart, Vascular, & Lung Health  Referring Provider Dr. Jude       Encounter Date: 02/16/2023  Check In:  Session Check In - 02/16/23 1323       Check-In   Supervising physician immediately available to respond to emergencies [facility] MD immediately available    Physician(s) Orren Fabry, PA    [facility]    Staff Present Ronal Levin, RN, BSN;Randi Midge BS, ACSM-CEP, Exercise Physiologist;Casey Claudene, RT    Virtual Visit No    Medication changes reported     No    Fall or balance concerns reported    Yes    Comments uses walker    Tobacco Cessation No Change    Warm-up and Cool-down Performed as group-led instruction   only   Resistance Training Performed Yes    VAD Patient? No    PAD/SET Patient? No      Pain Assessment   Currently in Pain? No/denies    Multiple Pain Sites No             Capillary Blood Glucose: No results found for this or any previous visit (from the past 24 hours).    Social History   Tobacco Use  Smoking Status Former   Current packs/day: 0.00   Average packs/day: 0.5 packs/day for 25.0 years (12.5 ttl pk-yrs)   Types: Cigarettes   Start date: 05/25/1963   Quit date: 05/24/1988   Years since quitting: 34.7  Smokeless Tobacco Never    Goals Met:  Exercise tolerated well No report of concerns or symptoms today Strength training completed today  Goals Unmet:  Not Applicable  Comments: Service time is from 1302 to 1435    Dr. Slater Staff is Medical Director for Pulmonary Rehab at [facility].

## 2023-02-18 ENCOUNTER — Encounter (HOSPITAL_COMMUNITY)
Admission: RE | Admit: 2023-02-18 | Discharge: 2023-02-18 | Disposition: A | Discharge status: Home / Self Care | Payer: Medicare Other | Source: Ambulatory Visit | Attending: Pulmonary Disease | Admitting: Pulmonary Disease

## 2023-02-18 DIAGNOSIS — J849 Interstitial pulmonary disease, unspecified: Secondary | ICD-10-CM | POA: Insufficient documentation

## 2023-02-18 NOTE — Progress Notes (Signed)
 Daily Session Note  Patient Details  Name: Marc Gilbert MRN: [id number] Date of Birth: 03-Oct-1930 Referring Provider:   Conrad Ports Pulmonary Rehab Walk Test from 02/01/2023 in [facility] for Heart, Vascular, & Lung Health  Referring Provider Dr. Jude       Encounter Date: 02/18/2023  Check In:  Session Check In - 02/18/23 1351       Check-In   Supervising physician immediately available to respond to emergencies [facility] immediately available    Physician(s) Rosaline Skains, NP    [facility]    Staff Present Ronal Levin, RN, BSN;Randi Reeve BS, ACSM-CEP, Exercise Physiologist;Adian Jablonowski Claudene, RT    Virtual Visit No    Medication changes reported     No    Fall or balance concerns reported    Yes    Comments uses walker    Tobacco Cessation No Change    Warm-up and Cool-down Performed as group-led instruction   only   Resistance Training Performed Yes    VAD Patient? No    PAD/SET Patient? No      Pain Assessment   Currently in Pain? No/denies    Multiple Pain Sites No             Capillary Blood Glucose: No results found for this or any previous visit (from the past 24 hours).    Social History   Tobacco Use  Smoking Status Former   Current packs/day: 0.00   Average packs/day: 0.5 packs/day for 25.0 years (12.5 ttl pk-yrs)   Types: Cigarettes   Start date: 05/25/1963   Quit date: 05/24/1988   Years since quitting: 34.7  Smokeless Tobacco Never    Goals Met:  Proper associated with RPD/PD & O2 Sat Independence with exercise equipment Exercise tolerated well No report of concerns or symptoms today Strength training completed today  Goals Unmet:  Not Applicable  Comments: Service time is from 1307 to 1433.    Dr. Slater Staff is Medical Director for Pulmonary Rehab at [facility].

## 2023-02-19 NOTE — Telephone Encounter (Signed)
 Refaxed January 2.

## 2023-02-22 NOTE — Telephone Encounter (Signed)
 Paperwork given to Dr Vassie Loll.

## 2023-02-22 NOTE — Telephone Encounter (Signed)
 [employer] - new start , needs paperwork

## 2023-02-23 ENCOUNTER — Encounter (HOSPITAL_COMMUNITY)
Admission: RE | Admit: 2023-02-23 | Discharge: 2023-02-23 | Disposition: A | Discharge status: Home / Self Care | Payer: Medicare Other | Source: Ambulatory Visit | Attending: Pulmonary Disease

## 2023-02-23 VITALS — Wt 200.8 lb

## 2023-02-23 DIAGNOSIS — J849 Interstitial pulmonary disease, unspecified: Secondary | ICD-10-CM

## 2023-02-23 NOTE — Progress Notes (Signed)
 Daily Session Note  Patient Details  Name: Marc Gilbert MRN: [id number] Date of Birth: [birth year] Referring Provider:   Conrad Ports Pulmonary Rehab Walk Test from 02/01/2023 in [facility] for Heart, Vascular, & Lung Health  Referring Provider Dr. Jude       Encounter Date: 02/23/2023  Check In:  Session Check In - 02/23/23 1321       Check-In   Supervising physician immediately available to respond to emergencies [facility] immediately available    Physician(s) Rosaline Skains, NP    [facility]    Staff Present Ronal Levin, RN, BSN;Randi Midge BS, ACSM-CEP, Exercise Physiologist;Lamone Ferrelli Claudene Neita Moats, MS, ACSM-CEP, Exercise Physiologist    Virtual Visit No    Medication changes reported     No    Fall or balance concerns reported    Yes    Comments uses walker    Tobacco Cessation No Change    Warm-up and Cool-down Performed as group-led instruction   only   Resistance Training Performed Yes    VAD Patient? No    PAD/SET Patient? No      Pain Assessment   Currently in Pain? No/denies    Multiple Pain Sites No             Capillary Blood Glucose: No results found for this or any previous visit (from the past 24 hours).   Exercise Prescription Changes - 02/23/23 1400       Response to Exercise   Blood Pressure (Admit) 102/58    Blood Pressure (Exercise) 122/62    Blood Pressure (Exit) 118/66    Heart Rate (Admit) 77 bpm    Heart Rate (Exercise) 96 bpm    Heart Rate (Exit) 81 bpm    Oxygen  Saturation (Admit) 100 %    Oxygen  Saturation (Exercise) 88 %    Oxygen  Saturation (Exit) 97 %    Rating of Perceived Exertion (Exercise) 13    Perceived Dyspnea (Exercise) 2    Duration Continue with 30 min of aerobic exercise without signs/symptoms of physical distress.    Intensity THRR unchanged      Resistance Training   Training Prescription Yes    Weight red bands    Reps 10-15    Time 10 Minutes       Oxygen    Oxygen  Continuous    Liters 6-15      Recumbant Bike   Level 2    Minutes 15    METs 2.3      Track   Laps 7    Minutes 15    METs 2.08      Oxygen    Maintain Oxygen  Saturation 88% or higher             Social History   Tobacco Use  Smoking Status Former   Current packs/day: 0.00   Average packs/day: 0.5 packs/day for 25.0 years (12.5 ttl pk-yrs)   Types: Cigarettes   Start date: 05/25/1963   Quit date: 05/24/1988   Years since quitting: 34.7  Smokeless Tobacco Never    Goals Met:  Proper associated with RPD/PD & O2 Sat Independence with exercise equipment Exercise tolerated well No report of concerns or symptoms today Strength training completed today  Goals Unmet:  Not Applicable  Comments: Service time is from 1307 to 1434.    Dr. Slater Staff is Medical Director for Pulmonary Rehab at [facility].

## 2023-02-23 NOTE — Telephone Encounter (Signed)
 Paperwork up front for patient to come by and sign his part.

## 2023-02-24 DIAGNOSIS — M79675 Pain in left toe(s): Secondary | ICD-10-CM | POA: Diagnosis not present

## 2023-02-24 DIAGNOSIS — B351 Tinea unguium: Secondary | ICD-10-CM | POA: Diagnosis not present

## 2023-02-24 DIAGNOSIS — L570 Actinic keratosis: Secondary | ICD-10-CM | POA: Diagnosis not present

## 2023-02-24 DIAGNOSIS — Z85828 Personal history of other malignant neoplasm of skin: Secondary | ICD-10-CM | POA: Diagnosis not present

## 2023-02-24 DIAGNOSIS — D485 Neoplasm of uncertain behavior of skin: Secondary | ICD-10-CM | POA: Diagnosis not present

## 2023-02-24 DIAGNOSIS — L578 Other skin changes due to chronic exposure to nonionizing radiation: Secondary | ICD-10-CM | POA: Diagnosis not present

## 2023-02-24 DIAGNOSIS — Z86018 Personal history of other benign neoplasm: Secondary | ICD-10-CM | POA: Diagnosis not present

## 2023-02-24 DIAGNOSIS — C44622 Squamous cell carcinoma of skin of right upper limb, including shoulder: Secondary | ICD-10-CM | POA: Diagnosis not present

## 2023-02-24 DIAGNOSIS — M79674 Pain in right toe(s): Secondary | ICD-10-CM | POA: Diagnosis not present

## 2023-02-24 DIAGNOSIS — D0462 Carcinoma in situ of skin of left upper limb, including shoulder: Secondary | ICD-10-CM | POA: Diagnosis not present

## 2023-02-24 DIAGNOSIS — L821 Other seborrheic keratosis: Secondary | ICD-10-CM | POA: Diagnosis not present

## 2023-02-24 DIAGNOSIS — C44529 Squamous cell carcinoma of skin of other part of trunk: Secondary | ICD-10-CM | POA: Diagnosis not present

## 2023-02-24 DIAGNOSIS — D225 Melanocytic nevi of trunk: Secondary | ICD-10-CM | POA: Diagnosis not present

## 2023-02-25 ENCOUNTER — Telehealth: Payer: Self-pay | Admitting: Pharmacist

## 2023-02-25 ENCOUNTER — Encounter (HOSPITAL_COMMUNITY)
Admission: RE | Admit: 2023-02-25 | Discharge: 2023-02-25 | Disposition: A | Discharge status: Home / Self Care | Payer: Medicare Other | Source: Ambulatory Visit | Attending: Pulmonary Disease | Admitting: Pulmonary Disease

## 2023-02-25 DIAGNOSIS — J849 Interstitial pulmonary disease, unspecified: Secondary | ICD-10-CM | POA: Diagnosis not present

## 2023-02-25 NOTE — Telephone Encounter (Signed)
 Patient returned paperwork and it has been faxed to [facility].

## 2023-02-25 NOTE — Telephone Encounter (Signed)
 Patient picked up paperwork to take home and fill out and bring back.

## 2023-02-25 NOTE — Telephone Encounter (Signed)
 Patient has brought paperwork to the office has been completed. Faxed to Pharmacy ATTN: Devki. Fax successful

## 2023-02-25 NOTE — Telephone Encounter (Signed)
 Received [employer] new start paperwork via Onbase (incorrect forms)  Submitted a Prior Authorization request to [facility] for OFEV via CoverMyMeds. Will update once we receive a response.  Key: [id number]

## 2023-02-25 NOTE — Progress Notes (Signed)
 Daily Session Note  Patient Details  Name: Marc Gilbert MRN: [id number] Date of Birth: 05-16-1930 Referring Provider:   Conrad Ports Pulmonary Rehab Walk Test from 02/01/2023 in [facility] for Heart, Vascular, & Lung Health  Referring Provider Dr. Jude       Encounter Date: 02/25/2023  Check In:  Session Check In - 02/25/23 1319       Check-In   Supervising physician immediately available to respond to emergencies [facility] immediately available    Physician(s) Barnie Press, NP    [facility]    Staff Present Ronal Levin, RN, BSN;Randi Midge BS, ACSM-CEP, Exercise Physiologist;Casey Claudene Neita Moats, MS, ACSM-CEP, Exercise Physiologist    Virtual Visit No    Medication changes reported     No    Fall or balance concerns reported    Yes    Comments uses walker    Tobacco Cessation No Change    Warm-up and Cool-down Performed as group-led instruction   only   Resistance Training Performed Yes    VAD Patient? No    PAD/SET Patient? No      Pain Assessment   Currently in Pain? No/denies    Multiple Pain Sites No             Capillary Blood Glucose: No results found for this or any previous visit (from the past 24 hours).    Social History   Tobacco Use  Smoking Status Former   Current packs/day: 0.00   Average packs/day: 0.5 packs/day for 25.0 years (12.5 ttl pk-yrs)   Types: Cigarettes   Start date: 05/25/1963   Quit date: 05/24/1988   Years since quitting: 34.7  Smokeless Tobacco Never    Goals Met:  Exercise tolerated well No report of concerns or symptoms today Strength training completed today  Goals Unmet:  Not Applicable  Comments: Service time is from 1303 to 1423    Dr. Slater Staff is Medical Director for Pulmonary Rehab at [facility].

## 2023-02-25 NOTE — Telephone Encounter (Signed)
 Received [employer] forms. Appears to be incorrect forms. We will work on translating information to correct forms. Thank you  Chesley Mires, PharmD, MPH, BCPS, CPP Clinical Pharmacist (Rheumatology and Pulmonology)

## 2023-02-26 ENCOUNTER — Other Ambulatory Visit (HOSPITAL_COMMUNITY): Payer: Self-pay

## 2023-02-26 NOTE — Telephone Encounter (Signed)
 Received notification from [employer] regarding a prior authorization for OFEV . Authorization has been APPROVED from 02/25/23 until further notice. Approval letter sent to scan center.  Per test claim, copay for 30 days supply is $990.87. Patient likely to benefit from [facility] Payment Plan.

## 2023-03-02 ENCOUNTER — Encounter (HOSPITAL_COMMUNITY)
Admission: RE | Admit: 2023-03-02 | Discharge: 2023-03-02 | Disposition: A | Discharge status: Home / Self Care | Payer: Medicare Other | Source: Ambulatory Visit | Attending: Pulmonary Disease

## 2023-03-02 DIAGNOSIS — J849 Interstitial pulmonary disease, unspecified: Secondary | ICD-10-CM

## 2023-03-02 NOTE — Progress Notes (Signed)
 Daily Session Note  Patient Details  Name: Marc Gilbert MRN: [id number] Date of Birth: August 12, 1930 Referring Provider:   Conrad Ports Pulmonary Rehab Walk Test from 02/01/2023 in [facility] for Heart, Vascular, & Lung Health  Referring Provider Dr. Jude       Encounter Date: 03/02/2023  Check In:  Session Check In - 03/02/23 1409       Check-In   Supervising physician immediately available to respond to emergencies [facility] MD immediately available    Physician(s) Lamarr Satterfield, NP    [facility]    Staff Present Ronal Levin, RN, BSN;Randi Midge HECKLE, ACSM-CEP, Exercise Physiologist;Casey Claudene Neita Moats, MS, ACSM-CEP, Exercise Physiologist    Virtual Visit No    Medication changes reported     No    Fall or balance concerns reported    Yes    Comments uses walker    Tobacco Cessation No Change    Warm-up and Cool-down Performed as group-led instruction   only   Resistance Training Performed Yes    VAD Patient? No    PAD/SET Patient? No      Pain Assessment   Currently in Pain? No/denies    Multiple Pain Sites No             Capillary Blood Glucose: No results found for this or any previous visit (from the past 24 hours).    Social History   Tobacco Use  Smoking Status Former   Current packs/day: 0.00   Average packs/day: 0.5 packs/day for 25.0 years (12.5 ttl pk-yrs)   Types: Cigarettes   Start date: 05/25/1963   Quit date: 05/24/1988   Years since quitting: 34.7  Smokeless Tobacco Never    Goals Met:  Proper associated with RPD/PD & O2 Sat Exercise tolerated well No report of concerns or symptoms today Strength training completed today  Goals Unmet:  Not Applicable  Comments: Service time is from 1304 to 1433.    Dr. Slater Staff is Medical Director for Pulmonary Rehab at [facility].

## 2023-03-04 ENCOUNTER — Encounter (HOSPITAL_COMMUNITY)
Admission: RE | Admit: 2023-03-04 | Discharge: 2023-03-04 | Disposition: A | Discharge status: Home / Self Care | Payer: Medicare Other | Source: Ambulatory Visit | Attending: Pulmonary Disease | Admitting: Pulmonary Disease

## 2023-03-04 DIAGNOSIS — J849 Interstitial pulmonary disease, unspecified: Secondary | ICD-10-CM | POA: Diagnosis not present

## 2023-03-04 NOTE — Progress Notes (Signed)
Daily Session Note  Patient Details  Name: Marc Gilbert MRN: [id number] Date of Birth: 1930/03/24 Referring Provider:   Doristine Devoid Pulmonary Rehab Walk Test from 02/01/2023 in [facility] for Heart, Vascular, & Lung Health  Referring Provider Dr. Vassie Loll       Encounter Date: 03/04/2023  Check In:  Session Check In - 03/04/23 1337       Check-In   Supervising physician immediately available to respond to emergencies [facility] MD immediately available    Physician(s) Joni Reining, NP    [facility]    Staff Present Essie Hart, RN, BSN;Randi Dionisio Paschal, ACSM-CEP, Exercise Physiologist;Casey Charlean Sanfilippo, MS, ACSM-CEP, Exercise Physiologist    Virtual Visit No    Medication changes reported     No    Fall or balance concerns reported    Yes    Comments uses walker    Tobacco Cessation No Change    Warm-up and Cool-down Performed as group-led instruction   only   Resistance Training Performed Yes    VAD Patient? No    PAD/SET Patient? No      Pain Assessment   Currently in Pain? No/denies    Multiple Pain Sites No             Capillary Blood Glucose: No results found for this or any previous visit (from the past 24 hours).    Social History   Tobacco Use  Smoking Status Former   Current packs/day: 0.00   Average packs/day: 0.5 packs/day for 25.0 years (12.5 ttl pk-yrs)   Types: Cigarettes   Start date: 05/25/1963   Quit date: 05/24/1988   Years since quitting: 34.8  Smokeless Tobacco Never    Goals Met:  Exercise tolerated well No report of concerns or symptoms today Strength training completed today  Goals Unmet:  Not Applicable  Comments: Service time is from 1308 to 1434    Dr. Mechele Collin is Medical Director for Pulmonary Rehab at [facility].

## 2023-03-05 ENCOUNTER — Encounter (HOSPITAL_BASED_OUTPATIENT_CLINIC_OR_DEPARTMENT_OTHER): Payer: Self-pay | Admitting: Pulmonary Disease

## 2023-03-05 NOTE — Addendum Note (Signed)
Encounter addended by: Essie Hart, RN on: 03/05/2023 10:20 AM  Actions taken: Flowsheet accepted

## 2023-03-05 NOTE — Telephone Encounter (Signed)
Attempted to enroll patient into PF grant through [employer] however income documents and SSN proof are required. Need to submit within 30 days  Marc Gilbert, PharmD, MPH, BCPS, CPP Clinical Pharmacist (Rheumatology and Pulmonology)

## 2023-03-09 ENCOUNTER — Encounter (HOSPITAL_COMMUNITY)
Admission: RE | Admit: 2023-03-09 | Discharge: 2023-03-09 | Disposition: A | Discharge status: Home / Self Care | Payer: Medicare Other | Source: Ambulatory Visit | Attending: Pulmonary Disease

## 2023-03-09 VITALS — Wt 200.6 lb

## 2023-03-09 DIAGNOSIS — J849 Interstitial pulmonary disease, unspecified: Secondary | ICD-10-CM | POA: Diagnosis not present

## 2023-03-09 NOTE — Progress Notes (Signed)
Daily Session Note  Patient Details  Name: Marc Gilbert MRN: [id number] Date of Birth: Mar 27, 1930 Referring Provider:   Doristine Devoid Pulmonary Rehab Walk Test from 02/01/2023 in [facility]  Referring Provider Dr. Vassie Loll       Encounter Date: 03/09/2023  Check In:  Session Check In - 03/09/23 1401       Check-In   Supervising physician immediately available to respond to emergencies [facility] MD immediately available    Physician(s) Bernadene Person, NP    Location [facility]    Staff Present Essie Hart, RN, BSN;Ellyson Rarick Katrinka Blazing, Zella Richer, MS, ACSM-CEP, Exercise Physiologist;Johnny Hale Bogus, MS, Exercise Physiologist    Virtual Visit No    Medication changes reported     No    Fall or balance concerns reported    Yes    Comments uses walker    Tobacco Cessation No Change    Warm-up and Cool-down Performed as group-led instruction   only   Resistance Training Performed Yes    VAD Patient? No    PAD/SET Patient? No      Pain Assessment   Currently in Pain? No/denies    Multiple Pain Sites No             Capillary Blood Glucose: No results found for this or any previous visit (from the past 24 hours).   Exercise Prescription Changes - 03/09/23 1400       Response to Exercise   Blood Pressure (Admit) 108/61    Blood Pressure (Exercise) 114/62    Blood Pressure (Exit) 110/60    Heart Rate (Admit) 80 bpm    Heart Rate (Exercise) 92 bpm    Heart Rate (Exit) 86 bpm    Oxygen Saturation (Admit) 97 %    Oxygen Saturation (Exercise) 95 %    Oxygen Saturation (Exit) 95 %    Rating of Perceived Exertion (Exercise) 13    Perceived Dyspnea (Exercise) 2    Duration Continue with 30 min of aerobic exercise without signs/symptoms of physical distress.    Intensity THRR unchanged      Progression   Progression Continue to progress workloads to maintain intensity without signs/symptoms of physical  distress.      Resistance Training   Training Prescription Yes    Weight red bands    Reps 10-15    Time 10 Minutes      Oxygen   Oxygen Continuous    Liters 6-15      Recumbant Bike   Level 2    RPM 80    Watts 28    Minutes 15    METs 2.6      NuStep   Level 5    SPM 69    Minutes 15    METs 2.5      Oxygen   Maintain Oxygen Saturation 88% or higher             Social History   Tobacco Use  Smoking Status Former   Current packs/day: 0.00   Average packs/day: 0.5 packs/day for 25.0 years (12.5 ttl pk-yrs)   Types: Cigarettes   Start date: 05/25/1963   Quit date: 05/24/1988   Years since quitting: 34.8  Smokeless Tobacco Never    Goals Met:  Proper associated with RPD/PD & O2 Sat Independence with exercise equipment Exercise tolerated well No report of concerns or symptoms today Strength training completed today  Goals Unmet:  Not Applicable  Comments: Service time is from 1306 to 1426.    Dr. Mechele Collin is Medical Director for Pulmonary Rehab at [facility].

## 2023-03-09 NOTE — Telephone Encounter (Signed)
[employer] now calling about POC. Adv [employer] fax machine he would need use is [employer]. Gave him the number and he is fax'ing now. I will check and put in Chantel's box.

## 2023-03-10 MED ORDER — OFEV 150 MG PO CAPS
150.0000 mg | ORAL_CAPSULE | Freq: Two times a day (BID) | ORAL | 1 refills | Status: DC
Start: 2023-03-10 — End: 2023-05-16

## 2023-03-10 NOTE — Telephone Encounter (Signed)
[employer] calling again. Order is in your box. Please try to send today.

## 2023-03-10 NOTE — Progress Notes (Signed)
Pulmonary Individual Treatment Plan  Patient Details  Name: Marc Gilbert MRN: [id number] Date of Birth: January 23, 1931 Referring Provider:   Doristine Devoid Pulmonary Rehab Walk Test from 02/01/2023 in [facility]  Referring Provider Dr. Vassie Loll       Initial Encounter Date:  Flowsheet Row Pulmonary Rehab Walk Test from 02/01/2023 in [facility]  Date 02/01/23       Visit Diagnosis: ILD (interstitial lung disease) (HCC)  Patient's Home Medications on Admission:   Current Outpatient Medications:    acetaminophen (TYLENOL) 500 MG tablet, Take 1,000 mg by mouth every 6 (six) hours as needed for mild pain or moderate pain (for pain)., Disp: , Rfl:    albuterol (PROVENTIL) (5 MG/ML) 0.5% nebulizer solution, Take 0.5 mLs (2.5 mg total) by nebulization every 6 (six) hours as needed for wheezing or shortness of breath., Disp: 20 mL, Rfl: 12   apixaban (ELIQUIS) 5 MG TABS tablet, Take 1 tablet (5 mg total) by mouth 2 (two) times daily., Disp: 60 tablet, Rfl: 6   atorvastatin (LIPITOR) 40 MG tablet, Take 1 tablet (40 mg total) by mouth at bedtime., Disp: 90 tablet, Rfl: 3   carvedilol (COREG) 3.125 MG tablet, Take 1 tablet (3.125 mg total) by mouth 2 (two) times daily., Disp: 180 tablet, Rfl: 3   diclofenac Sodium (VOLTAREN) 1 % GEL, Apply 1 application  topically See admin instructions. Apply 2-4 grams of gel topically to both knees once a day, Disp: , Rfl:    docusate sodium (COLACE) 250 MG capsule, Take 250 mg by mouth daily., Disp: , Rfl:    empagliflozin (JARDIANCE) 10 MG TABS tablet, Take 1 tablet (10 mg total) by mouth daily., Disp: 90 tablet, Rfl: 3   furosemide (LASIX) 20 MG tablet, Take 1 tablet (20 mg total) by mouth daily for 10 days. (Patient taking differently: Take 20 mg by mouth daily as needed for fluid or edema.), Disp: 30 tablet, Rfl: 0   gabapentin (NEURONTIN) 300  MG capsule, Take 300-600 mg by mouth See admin instructions. Take 300 mg by mouth between 3 AM-4 AM, 600 mg by mouth after breakfast, 600 mg between 3 PM-4 PM, and 300 mg at bedtime, Disp: , Rfl:    Glycerin-Hypromellose-PEG 400 (DRY EYE RELIEF DROPS) 0.2-0.2-1 % SOLN, Place 1 drop into both eyes 3 (three) times daily as needed (for dryness)., Disp: , Rfl:    guaiFENesin (MUCINEX) 600 MG 12 hr tablet, Take by mouth 2 (two) times daily., Disp: , Rfl:    levothyroxine (SYNTHROID) 50 MCG tablet, Take 50 mcg by mouth daily before breakfast., Disp: , Rfl:    Melatonin 10 MG TABS, Take 10 mg by mouth at bedtime., Disp: , Rfl:    Multiple Vitamins-Minerals (ONE-A-DAY MENS 50+ ADVANTAGE) TABS, Take 1 tablet by mouth daily with breakfast., Disp: , Rfl:    Omega-3 Fatty Acids (FISH OIL OMEGA-3 PO), Take 1,960 mg by mouth daily., Disp: , Rfl:    ondansetron (ZOFRAN) 4 MG tablet, Take 1 tablet (4 mg total) by mouth every 8 (eight) hours as needed for nausea or vomiting. (Patient not taking: Reported on 02/01/2023), Disp: 20 tablet, Rfl: 0   oxybutynin (DITROPAN) 5 MG tablet, Take 5 mg by mouth at bedtime., Disp: , Rfl:    polyethylene glycol (MIRALAX / GLYCOLAX) 17 g packet, Take 8.5 g by mouth daily as needed for mild constipation or moderate constipation.,  Disp: , Rfl:    sodium chloride (OCEAN) 0.65 % SOLN nasal spray, Place 1 spray into both nostrils as needed for congestion., Disp: , Rfl:    sodium chloride HYPERTONIC 3 % nebulizer solution, Take by nebulization daily as needed for other., Disp: 750 mL, Rfl: 12   SPIRIVA RESPIMAT 2.5 MCG/ACT AERS, SMARTSIG:2 Puff(s) Via Inhaler Daily, Disp: , Rfl:    Tamsulosin HCl (FLOMAX) 0.4 MG CAPS, Take 0.4 mg by mouth at bedtime., Disp: , Rfl:    testosterone cypionate (DEPOTESTOSTERONE CYPIONATE) 200 MG/ML injection, Inject 66.67 mg into the skin every Wednesday., Disp: , Rfl:   Past Medical History: Past Medical History:  Diagnosis Date   Arthritis    "right  hand; back" (08/30/2015)   Cancer (HCC)    skin  squamous and basal   Chronic lower back pain    CKD (chronic kidney disease), stage III (HCC)    Stage III   COPD (chronic obstructive pulmonary disease) (HCC)    no home O2   Coronary artery disease    Dysplastic colon polyp age 19   carcinoma in situ   GERD (gastroesophageal reflux disease)    occ   H/O hiatal hernia    Hyperlipidemia    Hypertension    Prostate cancer (HCC) 07/2014   active surveillance, Glisson 6    Tobacco Use: Social History   Tobacco Use  Smoking Status Former   Current packs/day: 0.00   Average packs/day: 0.5 packs/day for 25.0 years (12.5 ttl pk-yrs)   Types: Cigarettes   Start date: 05/25/1963   Quit date: 05/24/1988   Years since quitting: 34.8  Smokeless Tobacco Never    Labs: Review Flowsheet       Latest Ref Rng & Units 08/10/2016 08/14/2016 08/15/2016 10/30/2022  Labs for ITP Cardiac and Pulmonary Rehab  Cholestrol 0 - 200 mg/dL 027  - - -  LDL (calc) 0 - 99 mg/dL 87  - - -  HDL-C >25 mg/dL 56  - - -  Trlycerides <150 mg/dL 72  - - -  Hemoglobin D6U 4.8 - 5.6 % 5.7  - - -  PH, Arterial 7.350 - 7.450 - 7.338  7.341  7.387  7.472  7.349  - -  PCO2 arterial 32.0 - 48.0 mmHg - 37.1  34.8  37.6  35.8  49.8  - -  Bicarbonate 20.0 - 28.0 mmol/L - 19.9  18.8  22.9  26.2  27.4  - 27.8   TCO2 0 - 100 mmol/L - 21  20  20  24  27  28  29  28  27  27  25   -  Acid-base deficit 0.0 - 2.0 mmol/L - 5.0  6.0  2.0  - -  O2 Saturation % - 96.0  96.0  99.0  100.0  100.0  - 32     Details       Multiple values from one day are sorted in reverse-chronological order         Capillary Blood Glucose: Lab Results  Component Value Date   GLUCAP 147 (H) 08/16/2016   GLUCAP 128 (H) 08/16/2016   GLUCAP 124 (H) 08/16/2016   GLUCAP 110 (H) 08/16/2016   GLUCAP 114 (H) 08/16/2016     Pulmonary Assessment Scores:  Pulmonary Assessment Scores     Row Name 02/01/23 1514         ADL UCSD   ADL Phase Entry      SOB Score total 38  CAT Score   CAT Score 11       mMRC Score   mMRC Score 3             UCSD: Self-administered rating of dyspnea associated with activities of daily living (ADLs) 6-point scale (0 = "not at all" to 5 = "maximal or unable to do because of breathlessness")  Scoring Scores range from 0 to 120.  Minimally important difference is 5 units  CAT: CAT can identify the health impairment of COPD patients and is better correlated with disease progression.  CAT has a scoring range of zero to 40. The CAT score is classified into four groups of low (less than 10), medium (10 - 20), high (21-30) and very high (31-40) based on the impact level of disease on health status. A CAT score over 10 suggests significant symptoms.  A worsening CAT score could be explained by an exacerbation, poor medication adherence, poor inhaler technique, or progression of COPD or comorbid conditions.  CAT MCID is 2 points  mMRC: mMRC (Modified Medical Research Council) Dyspnea Scale is used to assess the degree of baseline functional disability in patients of respiratory disease due to dyspnea. No minimal important difference is established. A decrease in score of 1 point or greater is considered a positive change.   Pulmonary Function Assessment:  Pulmonary Function Assessment - 02/01/23 1329       Breath   Bilateral Breath Sounds Decreased    Shortness of Breath Yes;Limiting activity;Fear of Shortness of Breath             Exercise Target Goals: Exercise Program Goal: Individual exercise prescription set using results from initial 6 min walk test and THRR while considering  patient's activity barriers and safety.   Exercise Prescription Goal: Initial exercise prescription builds to 30-45 minutes a day of aerobic activity, 2-3 days per week.  Home exercise guidelines will be given to patient during program as part of exercise prescription that the participant will  acknowledge.  Activity Barriers & Risk Stratification:  Activity Barriers & Cardiac Risk Stratification - 02/01/23 1328       Activity Barriers & Cardiac Risk Stratification   Activity Barriers Deconditioning;Muscular Weakness;Shortness of Breath;Assistive Device             6 Minute Walk:  6 Minute Walk     Row Name 02/01/23 1512         6 Minute Walk   Phase Initial     Distance 600 feet     Walk Time 6 minutes     # of Rest Breaks 2     MPH 1.14     METS 0.55     RPE 12     Perceived Dyspnea  1     VO2 Peak 1.93     Symptoms No     Resting HR 84 bpm     Resting BP 106/60     Resting Oxygen Saturation  98 %     Exercise Oxygen Saturation  during 6 min walk 86 %     Max Ex. HR 111 bpm     Max Ex. BP 116/70     2 Minute Post BP 104/54       Interval HR   1 Minute HR 85     2 Minute HR 91     3 Minute HR 111     4 Minute HR 99     5 Minute HR 104  6 Minute HR 104     2 Minute Post HR 89     Interval Heart Rate? Yes       Interval Oxygen   Interval Oxygen? Yes     Baseline Oxygen Saturation % 98 %     1 Minute Oxygen Saturation % 95 %     1 Minute Liters of Oxygen 6 L     2 Minute Oxygen Saturation % 92 %     2 Minute Liters of Oxygen 6 L     3 Minute Oxygen Saturation % 86 %  2:30-2:50 increased O2 to 8L     3 Minute Liters of Oxygen 8 L     4 Minute Oxygen Saturation % 86 %  3:30-4:00 increased O2 to 10L     4 Minute Liters of Oxygen 10 L     5 Minute Oxygen Saturation % 91 %     5 Minute Liters of Oxygen 15 L     6 Minute Oxygen Saturation % 93 %     6 Minute Liters of Oxygen 15 L     2 Minute Post Oxygen Saturation % 95 %     2 Minute Post Liters of Oxygen 6 L              Oxygen Initial Assessment:  Oxygen Initial Assessment - 02/01/23 1328       Home Oxygen   Home Oxygen Device Portable Concentrator;Home Concentrator   in process of getting tanks   Sleep Oxygen Prescription Continuous    Liters per minute 5    Home Exercise  Oxygen Prescription Continuous    Liters per minute 5    Home Resting Oxygen Prescription Continuous    Liters per minute 5    Compliance with Home Oxygen Use Yes      Initial 6 min Walk   Oxygen Used Continuous    Liters per minute 15      Program Oxygen Prescription   Program Oxygen Prescription Continuous    Liters per minute 15      Intervention   Short Term Goals To learn and exhibit compliance with exercise, home and travel O2 prescription;To learn and understand importance of maintaining oxygen saturations>88%;To learn and understand importance of monitoring SPO2 with pulse oximeter and demonstrate accurate use of the pulse oximeter.;To learn and demonstrate proper pursed lip breathing techniques or other breathing techniques.     Long  Term Goals Exhibits compliance with exercise, home  and travel O2 prescription;Exhibits proper breathing techniques, such as pursed lip breathing or other method taught during program session;Verbalizes importance of monitoring SPO2 with pulse oximeter and return demonstration;Maintenance of O2 saturations>88%             Oxygen Re-Evaluation:  Oxygen Re-Evaluation     Row Name 02/08/23 1205 02/26/23 1143           Program Oxygen Prescription   Program Oxygen Prescription Continuous Continuous      Liters per minute 15 10      Comments -- 8-10 L walking track, 6-8 on recumbent bike        Home Oxygen   Home Oxygen Device Portable Concentrator;Home Concentrator  in process of getting tanks Portable Concentrator;Home Concentrator  in process of getting tanks      Sleep Oxygen Prescription Continuous Continuous      Liters per minute 5 5      Home Exercise Oxygen Prescription Continuous Continuous  Liters per minute 5 5      Home Resting Oxygen Prescription Continuous Continuous      Liters per minute 5 5      Compliance with Home Oxygen Use Yes Yes        Goals/Expected Outcomes   Short Term Goals To learn and exhibit  compliance with exercise, home and travel O2 prescription;To learn and understand importance of maintaining oxygen saturations>88%;To learn and understand importance of monitoring SPO2 with pulse oximeter and demonstrate accurate use of the pulse oximeter.;To learn and demonstrate proper pursed lip breathing techniques or other breathing techniques.  To learn and exhibit compliance with exercise, home and travel O2 prescription;To learn and understand importance of maintaining oxygen saturations>88%;To learn and understand importance of monitoring SPO2 with pulse oximeter and demonstrate accurate use of the pulse oximeter.;To learn and demonstrate proper pursed lip breathing techniques or other breathing techniques. ;To learn and demonstrate proper use of respiratory medications      Long  Term Goals Exhibits compliance with exercise, home  and travel O2 prescription;Exhibits proper breathing techniques, such as pursed lip breathing or other method taught during program session;Verbalizes importance of monitoring SPO2 with pulse oximeter and return demonstration;Maintenance of O2 saturations>88% Exhibits compliance with exercise, home  and travel O2 prescription;Exhibits proper breathing techniques, such as pursed lip breathing or other method taught during program session;Verbalizes importance of monitoring SPO2 with pulse oximeter and return demonstration;Maintenance of O2 saturations>88%      Comments -- Pt struggles to accept his O2 needs. Plan to try forehead probe while walking to ensure accuracy.      Goals/Expected Outcomes Compliance and understanding of oxygen saturation monitoring and breathing techniques to decrease shortness of breath. Compliance and understanding of oxygen saturation monitoring and breathing techniques to decrease shortness of breath.               Oxygen Discharge (Final Oxygen Re-Evaluation):  Oxygen Re-Evaluation - 02/26/23 1143       Program Oxygen Prescription    Program Oxygen Prescription Continuous    Liters per minute 10    Comments 8-10 L walking track, 6-8 on recumbent bike      Home Oxygen   Home Oxygen Device Portable Concentrator;Home Concentrator   in process of getting tanks   Sleep Oxygen Prescription Continuous    Liters per minute 5    Home Exercise Oxygen Prescription Continuous    Liters per minute 5    Home Resting Oxygen Prescription Continuous    Liters per minute 5    Compliance with Home Oxygen Use Yes      Goals/Expected Outcomes   Short Term Goals To learn and exhibit compliance with exercise, home and travel O2 prescription;To learn and understand importance of maintaining oxygen saturations>88%;To learn and understand importance of monitoring SPO2 with pulse oximeter and demonstrate accurate use of the pulse oximeter.;To learn and demonstrate proper pursed lip breathing techniques or other breathing techniques. ;To learn and demonstrate proper use of respiratory medications    Long  Term Goals Exhibits compliance with exercise, home  and travel O2 prescription;Exhibits proper breathing techniques, such as pursed lip breathing or other method taught during program session;Verbalizes importance of monitoring SPO2 with pulse oximeter and return demonstration;Maintenance of O2 saturations>88%    Comments Pt struggles to accept his O2 needs. Plan to try forehead probe while walking to ensure accuracy.    Goals/Expected Outcomes Compliance and understanding of oxygen saturation monitoring and breathing techniques to decrease shortness of breath.  Initial Exercise Prescription:  Initial Exercise Prescription - 02/01/23 1500       Date of Initial Exercise RX and Referring Provider   Date 02/01/23    Referring Provider Dr. Vassie Loll    Expected Discharge Date 04/29/23      Oxygen   Oxygen Continuous    Liters 6-15    Maintain Oxygen Saturation 88% or higher      Recumbant Bike   Level 1    Minutes 15    METs  1.6      Recumbant Elliptical   Level 1    Minutes 15    METs 1.5      Prescription Details   Frequency (times per week) 2    Duration Progress to 30 minutes of continuous aerobic without signs/symptoms of physical distress      Intensity   THRR 40-80% of Max Heartrate 52-104    Ratings of Perceived Exertion 11-13    Perceived Dyspnea 0-4      Progression   Progression Continue to progress workloads to maintain intensity without signs/symptoms of physical distress.      Resistance Training   Training Prescription Yes    Weight red bands    Reps 10-15             Perform Capillary Blood Glucose checks as needed.  Exercise Prescription Changes:   Exercise Prescription Changes     Row Name 02/23/23 1400 03/09/23 1400           Response to Exercise   Blood Pressure (Admit) 102/58 108/61      Blood Pressure (Exercise) 122/62 114/62      Blood Pressure (Exit) 118/66 110/60      Heart Rate (Admit) 77 bpm 80 bpm      Heart Rate (Exercise) 96 bpm 92 bpm      Heart Rate (Exit) 81 bpm 86 bpm      Oxygen Saturation (Admit) 100 % 97 %      Oxygen Saturation (Exercise) 88 % 95 %      Oxygen Saturation (Exit) 97 % 95 %      Rating of Perceived Exertion (Exercise) 13 13      Perceived Dyspnea (Exercise) 2 2      Duration Continue with 30 min of aerobic exercise without signs/symptoms of physical distress. Continue with 30 min of aerobic exercise without signs/symptoms of physical distress.      Intensity THRR unchanged THRR unchanged        Progression   Progression -- Continue to progress workloads to maintain intensity without signs/symptoms of physical distress.        Resistance Training   Training Prescription Yes Yes      Weight red bands red bands      Reps 10-15 10-15      Time 10 Minutes 10 Minutes        Oxygen   Oxygen Continuous Continuous      Liters 6-15 6-15        Recumbant Bike   Level 2 2      RPM -- 80      Watts -- 28      Minutes 15 15       METs 2.3 2.6        NuStep   Level -- 5      SPM -- 69      Minutes -- 15      METs -- 2.5  Track   Laps 7 --      Minutes 15 --      METs 2.08 --        Oxygen   Maintain Oxygen Saturation 88% or higher 88% or higher               Exercise Comments:   Exercise Comments     Row Name 02/11/23 1506           Exercise Comments Pt completed first day of group exercise. He exercised on the recumbent elliptical for 15 min, level 2, METs 2.4. He then exercised on the recumbent bike for 15 min, level 2, METs 2.1. Pt performed warm up and cool down exercises standing, including squats, holding to his rollator. He began on red bands however they were too easy therefore increased to blue bands.  Discussed METs.                Exercise Goals and Review:   Exercise Goals     Row Name 02/01/23 1328             Exercise Goals   Increase Physical Activity Yes       Intervention Provide advice, education, support and counseling about physical activity/exercise needs.;Develop an individualized exercise prescription for aerobic and resistive training based on initial evaluation findings, risk stratification, comorbidities and participant's personal goals.       Expected Outcomes Short Term: Attend rehab on a regular basis to increase amount of physical activity.;Long Term: Exercising regularly at least 3-5 days a week.;Long Term: Add in home exercise to make exercise part of routine and to increase amount of physical activity.       Increase Strength and Stamina Yes       Intervention Provide advice, education, support and counseling about physical activity/exercise needs.;Develop an individualized exercise prescription for aerobic and resistive training based on initial evaluation findings, risk stratification, comorbidities and participant's personal goals.       Expected Outcomes Short Term: Increase workloads from initial exercise prescription for resistance, speed,  and METs.;Short Term: Perform resistance training exercises routinely during rehab and add in resistance training at home;Long Term: Improve cardiorespiratory fitness, muscular endurance and strength as measured by increased METs and functional capacity ( )       Able to understand and use rate of perceived exertion (RPE) scale Yes       Intervention Provide education and explanation on how to use RPE scale       Expected Outcomes Short Term: Able to use RPE daily in rehab to express subjective intensity level;Long Term:  Able to use RPE to guide intensity level when exercising independently       Able to understand and use Dyspnea scale Yes       Intervention Provide education and explanation on how to use Dyspnea scale       Expected Outcomes Short Term: Able to use Dyspnea scale daily in rehab to express subjective sense of shortness of breath during exertion;Long Term: Able to use Dyspnea scale to guide intensity level when exercising independently       Knowledge and understanding of Target Heart Rate Range (THRR) Yes       Intervention Provide education and explanation of THRR including how the numbers were predicted and where they are located for reference       Expected Outcomes Short Term: Able to state/look up THRR;Short Term: Able to use daily as guideline for intensity in rehab;Long Term:  Able to use THRR to govern intensity when exercising independently       Understanding of Exercise Prescription Yes       Intervention Provide education, explanation, and written materials on patient's individual exercise prescription       Expected Outcomes Short Term: Able to explain program exercise prescription;Long Term: Able to explain home exercise prescription to exercise independently                Exercise Goals Re-Evaluation :  Exercise Goals Re-Evaluation     Row Name 02/08/23 1204 02/26/23 1141           Exercise Goal Re-Evaluation   Exercise Goals Review Increase Physical  Activity;Increase Strength and Stamina;Able to understand and use rate of perceived exertion (RPE) scale;Able to understand and use Dyspnea scale;Knowledge and understanding of Target Heart Rate Range (THRR);Understanding of Exercise Prescription Increase Physical Activity;Increase Strength and Stamina;Able to understand and use rate of perceived exertion (RPE) scale;Able to understand and use Dyspnea scale;Knowledge and understanding of Target Heart Rate Range (THRR);Understanding of Exercise Prescription      Comments Pt to begin exercise 12/24. Will monitor for progression. Pt has completed 5 exercise sessions with perfect attendance so far. He is walking the track with a walker for 15 min, 2.38 METs. He then is exercising on the recumbent bike, level 2, METs 2.5. He tolerates well but needs increased O2. Performs warm up and cool down independently standing, including squats. Will monitor for progression. He is using blue bands.      Expected Outcomes Through exercise at rehab and home, the patient will decrease shortness of breath with daily activities and feel confident in carrying out an exercise regimen at home. Through exercise at rehab and home, the patient will decrease shortness of breath with daily activities and feel confident in carrying out an exercise regimen at home.               Discharge Exercise Prescription (Final Exercise Prescription Changes):  Exercise Prescription Changes - 03/09/23 1400       Response to Exercise   Blood Pressure (Admit) 108/61    Blood Pressure (Exercise) 114/62    Blood Pressure (Exit) 110/60    Heart Rate (Admit) 80 bpm    Heart Rate (Exercise) 92 bpm    Heart Rate (Exit) 86 bpm    Oxygen Saturation (Admit) 97 %    Oxygen Saturation (Exercise) 95 %    Oxygen Saturation (Exit) 95 %    Rating of Perceived Exertion (Exercise) 13    Perceived Dyspnea (Exercise) 2    Duration Continue with 30 min of aerobic exercise without signs/symptoms of  physical distress.    Intensity THRR unchanged      Progression   Progression Continue to progress workloads to maintain intensity without signs/symptoms of physical distress.      Resistance Training   Training Prescription Yes    Weight red bands    Reps 10-15    Time 10 Minutes      Oxygen   Oxygen Continuous    Liters 6-15      Recumbant Bike   Level 2    RPM 80    Watts 28    Minutes 15    METs 2.6      NuStep   Level 5    SPM 69    Minutes 15    METs 2.5      Oxygen   Maintain Oxygen Saturation 88% or higher  Nutrition:  Target Goals: Understanding of nutrition guidelines, daily intake of sodium 1500mg , cholesterol 200mg , calories 30% from fat and 7% or less from saturated fats, daily to have 5 or more servings of fruits and vegetables.  Biometrics:  Pre Biometrics - 02/01/23 1420       Pre Biometrics   Grip Strength 21 kg              Nutrition Therapy Plan and Nutrition Goals:   Nutrition Assessments:  MEDIFICTS Score Key: >=70 Need to make dietary changes  40-70 Heart Healthy Diet <= 40 Therapeutic Level Cholesterol Diet   Picture Your Plate Scores: <40 Unhealthy dietary pattern with much room for improvement. 41-50 Dietary pattern unlikely to meet recommendations for good health and room for improvement. 51-60 More healthful dietary pattern, with some room for improvement.  >60 Healthy dietary pattern, although there may be some specific behaviors that could be improved.    Nutrition Goals Re-Evaluation:   Nutrition Goals Discharge (Final Nutrition Goals Re-Evaluation):   Psychosocial: Target Goals: Acknowledge presence or absence of significant depression and/or stress, maximize coping skills, provide positive support system. Participant is able to verbalize types and ability to use techniques and skills needed for reducing stress and depression.  Initial Review & Psychosocial Screening:  Initial Psych Review &  Screening - 02/01/23 1422       Initial Review   Current issues with Current Stress Concerns    Source of Stress Concerns Family      Family Dynamics   Good Support System? Yes      Screening Interventions   Interventions Encouraged to exercise    Expected Outcomes Long Term Goal: Stressors or current issues are controlled or eliminated.;Short Term goal: Identification and review with participant of any Quality of Life or Depression concerns found by scoring the questionnaire.;Long Term goal: The participant improves quality of Life and PHQ9 Scores as seen by post scores and/or verbalization of changes             Quality of Life Scores:  Scores of 19 and below usually indicate a poorer quality of life in these areas.  A difference of  2-3 points is a clinically meaningful difference.  A difference of 2-3 points in the total score of the Quality of Life Index has been associated with significant improvement in overall quality of life, self-image, physical symptoms, and general health in studies assessing change in quality of life.  PHQ-9: Review Flowsheet       02/01/2023  Depression screen PHQ 2/9  Decreased Interest 1  Down, Depressed, Hopeless 1  PHQ - 2 Score 2  Altered sleeping 0  Tired, decreased energy 1  Change in appetite 0  Feeling bad or failure about yourself  0  Trouble concentrating 0  Moving slowly or fidgety/restless 0  Suicidal thoughts 0  PHQ-9 Score 3  Difficult doing work/chores Not difficult at all   Interpretation of Total Score  Total Score Depression Severity:  1-4 = Minimal depression, 5-9 = Mild depression, 10-14 = Moderate depression, 15-19 = Moderately severe depression, 20-27 = Severe depression   Psychosocial Evaluation and Intervention:  Psychosocial Evaluation - 02/01/23 1425       Psychosocial Evaluation & Interventions   Interventions Encouraged to exercise with the program and follow exercise prescription    Comments Harsimran  denies any psychosocial concerns at this time. He does state he is worried about his health and has to start helping his wife out more d/t  short term memory loss. He denies any needs at this time. He states he has an "over supportive" family.    Expected Outcomes For Joas to attend Pulm Rehab free of any psychosocial barriers or concerns    Continue Psychosocial Services  No Follow up required             Psychosocial Re-Evaluation:  Psychosocial Re-Evaluation     Row Name 02/03/23 1225 03/05/23 0850           Psychosocial Re-Evaluation   Current issues with None Identified None Identified      Comments No changes since orientation. Abaan is scheduled to start the program on 02/11/23. At re-evaluation Elier denies any psy-soc needs or barriers. Yianni easily gets frustrated when staff educate about the need for increasing his oxygen liter flow. Mareo has not come to terms about needing additional O2 support with exertion and switching from his POC to tanks.      Expected Outcomes For Paxson to decrease stress and attend PR free of any psy/soc barriers or concerns For Jorgeluis to attend PR free of any psy/soc barriers or concerns      Interventions Encouraged to attend Pulmonary Rehabilitation for the exercise Encouraged to attend Pulmonary Rehabilitation for the exercise      Continue Psychosocial Services  No Follow up required No Follow up required               Psychosocial Discharge (Final Psychosocial Re-Evaluation):  Psychosocial Re-Evaluation - 03/05/23 0850       Psychosocial Re-Evaluation   Current issues with None Identified    Comments At re-evaluation Phynix denies any psy-soc needs or barriers. Kamdin easily gets frustrated when staff educate about the need for increasing his oxygen liter flow. Bentli has not come to terms about needing additional O2 support with exertion and switching from his POC to tanks.    Expected Outcomes For Eragon to attend PR free of  any psy/soc barriers or concerns    Interventions Encouraged to attend Pulmonary Rehabilitation for the exercise    Continue Psychosocial Services  No Follow up required             Education: Education Goals: Education classes will be provided on a weekly basis, covering required topics. Participant will state understanding/return demonstration of topics presented.  Learning Barriers/Preferences:  Learning Barriers/Preferences - 02/01/23 1540       Learning Barriers/Preferences   Learning Barriers Hearing    Learning Preferences Individual Instruction;Group Instruction;Written Material             Education Topics: Know Your Numbers Group instruction that is supported by a PowerPoint presentation. Instructor discusses importance of knowing and understanding resting, exercise, and post-exercise oxygen saturation, heart rate, and blood pressure. Oxygen saturation, heart rate, blood pressure, rating of perceived exertion, and dyspnea are reviewed along with a normal range for these values.  Flowsheet Row PULMONARY REHAB OTHER RESPIRATORY from 02/18/2023 in [facility] for Heart, Vascular, & Lung Health  Date 02/18/23  Educator EP  Instruction Review Code 1- Verbalizes Understanding       Exercise for the Pulmonary Patient Group instruction that is supported by a PowerPoint presentation. Instructor discusses benefits of exercise, core components of exercise, frequency, duration, and intensity of an exercise routine, importance of utilizing pulse oximetry during exercise, safety while exercising, and options of places to exercise outside of rehab.  Flowsheet Row PULMONARY REHAB OTHER RESPIRATORY from 02/11/2023 in [facility]  for Heart, Vascular, & Lung Health  Date 02/11/23  Educator EP  Instruction Review Code 1- Verbalizes Understanding       MET Level  Group instruction provided by PowerPoint, verbal discussion, and  written material to support subject matter. Instructor reviews what METs are and how to increase METs.    Pulmonary Medications Verbally interactive group education provided by instructor with focus on inhaled medications and proper administration.   Anatomy and Physiology of the Respiratory System Group instruction provided by PowerPoint, verbal discussion, and written material to support subject matter. Instructor reviews respiratory cycle and anatomical components of the respiratory system and their functions. Instructor also reviews differences in obstructive and restrictive respiratory diseases with examples of each.    Oxygen Safety Group instruction provided by PowerPoint, verbal discussion, and written material to support subject matter. There is an overview of "What is Oxygen" and "Why do we need it".  Instructor also reviews how to create a safe environment for oxygen use, the importance of using oxygen as prescribed, and the risks of noncompliance. There is a brief discussion on traveling with oxygen and resources the patient may utilize. Flowsheet Row PULMONARY REHAB OTHER RESPIRATORY from 02/25/2023 in [facility] for Heart, Vascular, & Lung Health  Date 02/25/23  Educator RN  Instruction Review Code 1- Verbalizes Understanding       Oxygen Use Group instruction provided by PowerPoint, verbal discussion, and written material to discuss how supplemental oxygen is prescribed and different types of oxygen supply systems. Resources for more information are provided.  Flowsheet Row PULMONARY REHAB OTHER RESPIRATORY from 03/04/2023 in [facility] for Heart, Vascular, & Lung Health  Date 03/04/23  Educator RT  Instruction Review Code 1- Verbalizes Understanding       Breathing Techniques Group instruction that is supported by demonstration and informational handouts. Instructor discusses the benefits of pursed lip and diaphragmatic  breathing and detailed demonstration on how to perform both.     Risk Factor Reduction Group instruction that is supported by a PowerPoint presentation. Instructor discusses the definition of a risk factor, different risk factors for pulmonary disease, and how the heart and lungs work together.   Pulmonary Diseases Group instruction provided by PowerPoint, verbal discussion, and written material to support subject matter. Instructor gives an overview of the different type of pulmonary diseases. There is also a discussion on risk factors and symptoms as well as ways to manage the diseases.   Stress and Energy Conservation Group instruction provided by PowerPoint, verbal discussion, and written material to support subject matter. Instructor gives an overview of stress and the impact it can have on the body. Instructor also reviews ways to reduce stress. There is also a discussion on energy conservation and ways to conserve energy throughout the day.   Warning Signs and Symptoms Group instruction provided by PowerPoint, verbal discussion, and written material to support subject matter. Instructor reviews warning signs and symptoms of stroke, heart attack, cold and flu. Instructor also reviews ways to prevent the spread of infection.   Other Education Group or individual verbal, written, or video instructions that support the educational goals of the pulmonary rehab program.    Knowledge Questionnaire Score:  Knowledge Questionnaire Score - 02/01/23 1524       Knowledge Questionnaire Score   Pre Score 16/18             Core Components/Risk Factors/Patient Goals at Admission:  Personal Goals and Risk Factors at Admission -  02/01/23 1330       Core Components/Risk Factors/Patient Goals on Admission   Improve shortness of breath with ADL's Yes    Intervention Provide education, individualized exercise plan and daily activity instruction to help decrease symptoms of SOB with  activities of daily living.    Expected Outcomes Short Term: Improve cardiorespiratory fitness to achieve a reduction of symptoms when performing ADLs             Core Components/Risk Factors/Patient Goals Review:   Goals and Risk Factor Review     Row Name 02/03/23 1228 03/05/23 1610           Core Components/Risk Factors/Patient Goals Review   Personal Goals Review Improve shortness of breath with ADL's;Develop more efficient breathing techniques such as purse lipped breathing and diaphragmatic breathing and practicing self-pacing with activity. Improve shortness of breath with ADL's;Develop more efficient breathing techniques such as purse lipped breathing and diaphragmatic breathing and practicing self-pacing with activity.      Review Unable to assess goals. Gurtej is scheduled to start the program on 02/11/23 Core components/risk factors/patient goals monthly re-evaluation are as follows: Goal in progress on improving SOB with ADLs and on developing more efficient breathing techniques such as purse lipped breathing and diaphragmatic breathing; and practicing self-pacing activities. Arlee can demonstrate purse lipped breathing but needs prompting by staff when he gets short of breath. He is working on diaphragmatic breathing and practices this at home. He can self-pace himself as needed and takes breaks at appropriate times based on his shortness of breath. Kenton can report his rate of perceived exertion and dyspnea level accurately. His oxygen demands have increased with exertion. He is now requiring 10L while walking the track. Gershon has completed 7 sessions thus far and has improved his workload and METS. Milagro will continue to benefit from participation in PR for nutrition, education, exercise, and lifestyle modification.      Expected Outcomes For Lem to improve his shortness of breath with ADLs and develop more efficient breathing techniques such as purse lipped breathing and  diaphragmatic breathing; and practicing self-pacing activities. For Beauford to improve his shortness of breath with ADLs and develop more efficient breathing techniques such as purse lipped breathing and diaphragmatic breathing; and practicing self-pacing activities.               Core Components/Risk Factors/Patient Goals at Discharge (Final Review):   Goals and Risk Factor Review - 03/05/23 0852       Core Components/Risk Factors/Patient Goals Review   Personal Goals Review Improve shortness of breath with ADL's;Develop more efficient breathing techniques such as purse lipped breathing and diaphragmatic breathing and practicing self-pacing with activity.    Review Core components/risk factors/patient goals monthly re-evaluation are as follows: Goal in progress on improving SOB with ADLs and on developing more efficient breathing techniques such as purse lipped breathing and diaphragmatic breathing; and practicing self-pacing activities. Zeek can demonstrate purse lipped breathing but needs prompting by staff when he gets short of breath. He is working on diaphragmatic breathing and practices this at home. He can self-pace himself as needed and takes breaks at appropriate times based on his shortness of breath. Kostantinos can report his rate of perceived exertion and dyspnea level accurately. His oxygen demands have increased with exertion. He is now requiring 10L while walking the track. Azir has completed 7 sessions thus far and has improved his workload and METS. Abdoul will continue to benefit from participation in PR for nutrition,  education, exercise, and lifestyle modification.    Expected Outcomes For Piotr to improve his shortness of breath with ADLs and develop more efficient breathing techniques such as purse lipped breathing and diaphragmatic breathing; and practicing self-pacing activities.             ITP Comments:Pt is making expected progress toward Pulmonary Rehab goals after  completing 8 session(s). Recommend continued exercise, life style modification, education, and utilization of breathing techniques to increase stamina and strength, while also decreasing shortness of breath with exertion.  Dr. Mechele Collin is Medical Director for Pulmonary Rehab at [facility].

## 2023-03-10 NOTE — Telephone Encounter (Signed)
Spoke with patient regarding Ofev cost. He states that he was expecting not to qualify for [employer] PAP. He would like to pay for the medication up front rather than using Medicare prescription Payment Plan.  Patient counseled on purpose, proper use, and potential adverse effects including diarrhea, nausea, vomiting, abdominal pain, decreased appetite, weight loss, and increased blood pressure. Stressed the importance of routine lab monitoring. Will monitor LFT's every month for the first 6 months of treatment then every 3 months.  Patient would like labs drawn at [facility] (standing ordesr for Lfts faxed today, [phone])  Ofev dose will be 150 mg capsule every 12 hours with food. Stressed importance of taking with food to minimize stomach upset.  Rx sent to [facility] today. Provided patient with pharmacy name, phone number, turnaround time of at least 7 days, med name. Provided him with my name and direct callback number. Phone: [phone]  Chesley Mires, PharmD, MPH, BCPS, CPP Clinical Pharmacist (Rheumatology and Pulmonology)

## 2023-03-11 ENCOUNTER — Encounter (HOSPITAL_COMMUNITY)
Admission: RE | Admit: 2023-03-11 | Discharge: 2023-03-11 | Disposition: A | Discharge status: Home / Self Care | Payer: Medicare Other | Source: Ambulatory Visit | Attending: Pulmonary Disease | Admitting: Pulmonary Disease

## 2023-03-11 DIAGNOSIS — J849 Interstitial pulmonary disease, unspecified: Secondary | ICD-10-CM | POA: Diagnosis not present

## 2023-03-11 NOTE — Progress Notes (Signed)
Daily Session Note  Patient Details  Name: BLAYK RILEY MRN: [id number] Date of Birth: [birth year] Referring Provider:   Doristine Devoid Pulmonary Rehab Walk Test from 02/01/2023 in [facility] for Heart, Vascular, & Lung Health  Referring Provider Dr. Vassie Loll       Encounter Date: 03/11/2023  Check In:  Session Check In - 03/11/23 1328       Check-In   Supervising physician immediately available to respond to emergencies [facility] MD immediately available    Physician(s) Robin Searing, NP    Location [facility]    Staff Present Essie Hart, RN, BSN;Decorey Wahlert Charlean Sanfilippo, MS, ACSM-CEP, Exercise Physiologist    Virtual Visit No    Medication changes reported     No    Fall or balance concerns reported    Yes    Comments uses walker    Tobacco Cessation No Change    Warm-up and Cool-down Performed as group-led instruction   only   Resistance Training Performed Yes    VAD Patient? No    PAD/SET Patient? No      Pain Assessment   Currently in Pain? No/denies    Multiple Pain Sites No             Capillary Blood Glucose: No results found for this or any previous visit (from the past 24 hours).    Social History   Tobacco Use  Smoking Status Former   Current packs/day: 0.00   Average packs/day: 0.5 packs/day for 25.0 years (12.5 ttl pk-yrs)   Types: Cigarettes   Start date: 05/25/1963   Quit date: 05/24/1988   Years since quitting: 34.8  Smokeless Tobacco Never    Goals Met:  Proper associated with RPD/PD & O2 Sat Independence with exercise equipment Exercise tolerated well No report of concerns or symptoms today Strength training completed today  Goals Unmet:  Not Applicable  Comments: Service time is from 1307 to 1436.    Dr. Mechele Collin is Medical Director for Pulmonary Rehab at [facility].

## 2023-03-12 ENCOUNTER — Telehealth: Payer: Self-pay | Admitting: Pulmonary Disease

## 2023-03-12 NOTE — Telephone Encounter (Signed)
Called [employer] to receive the order directly to my fax machine. I'll call pt when the situation has been resolved.

## 2023-03-12 NOTE — Telephone Encounter (Signed)
[facility] checking on order for portable oxygen concentrator. Needs order for portable oxygen concentrator. [facility] phone number is [phone].

## 2023-03-12 NOTE — Telephone Encounter (Signed)
It's been Faxed over to [facility] and left a voicemail for pt to inform him that it's been received and faxed over.

## 2023-03-16 ENCOUNTER — Ambulatory Visit (HOSPITAL_BASED_OUTPATIENT_CLINIC_OR_DEPARTMENT_OTHER): Payer: Medicare Other | Admitting: Pulmonary Disease

## 2023-03-16 ENCOUNTER — Encounter (HOSPITAL_BASED_OUTPATIENT_CLINIC_OR_DEPARTMENT_OTHER): Payer: Self-pay | Admitting: Pulmonary Disease

## 2023-03-16 ENCOUNTER — Ambulatory Visit (INDEPENDENT_AMBULATORY_CARE_PROVIDER_SITE_OTHER): Payer: Medicare Other | Admitting: Pulmonary Disease

## 2023-03-16 VITALS — BP 122/74 | HR 86 | Ht 69.0 in | Wt 200.0 lb

## 2023-03-16 DIAGNOSIS — J849 Interstitial pulmonary disease, unspecified: Secondary | ICD-10-CM

## 2023-03-16 DIAGNOSIS — J84112 Idiopathic pulmonary fibrosis: Secondary | ICD-10-CM

## 2023-03-16 DIAGNOSIS — J9611 Chronic respiratory failure with hypoxia: Secondary | ICD-10-CM | POA: Diagnosis not present

## 2023-03-16 LAB — PULMONARY FUNCTION TEST
DL/VA % pred: 78 %
DL/VA: 2.96 ml/min/mmHg/L
DLCO cor % pred: 43 %
DLCO cor: 9.77 ml/min/mmHg
DLCO unc % pred: 43 %
DLCO unc: 9.77 ml/min/mmHg
FEF 25-75 Post: 1.88 L/s
FEF 25-75 Pre: 1.68 L/s
FEF2575-%Change-Post: 11 %
FEF2575-%Pred-Post: 133 %
FEF2575-%Pred-Pre: 119 %
FEV1-%Change-Post: 2 %
FEV1-%Pred-Post: 87 %
FEV1-%Pred-Pre: 85 %
FEV1-Post: 2.06 L
FEV1-Pre: 2.02 L
FEV1FVC-%Change-Post: 4 %
FEV1FVC-%Pred-Pre: 111 %
FEV6-%Change-Post: 0 %
FEV6-%Pred-Post: 81 %
FEV6-%Pred-Pre: 80 %
FEV6-Post: 2.57 L
FEV6-Pre: 2.57 L
FEV6FVC-%Change-Post: 1 %
FEV6FVC-%Pred-Post: 108 %
FEV6FVC-%Pred-Pre: 106 %
FVC-%Change-Post: -1 %
FVC-%Pred-Post: 74 %
FVC-%Pred-Pre: 75 %
FVC-Post: 2.57 L
FVC-Pre: 2.62 L
Post FEV1/FVC ratio: 80 %
Post FEV6/FVC ratio: 100 %
Pre FEV1/FVC ratio: 77 %
Pre FEV6/FVC Ratio: 98 %
RV % pred: 44 %
RV: 1.25 L
TLC % pred: 56 %
TLC: 3.95 L

## 2023-03-16 NOTE — Progress Notes (Signed)
Full PFT Performed Today

## 2023-03-16 NOTE — Patient Instructions (Addendum)
X LFTs every month x3  starting end Feb   Check Sat on RA at rest Aim for sat above 90%  Continue pulm rehab Good luck with Ofev

## 2023-03-16 NOTE — Progress Notes (Unsigned)
Subjective:    Patient ID: Marc Gilbert, male    DOB: 1930/04/09, 89 y.o.   MRN: [id number]  HPI 89 year old for follow-up of IPF and chronic hypoxic respiratory failure  He smoked 3/4 pack/day until he quit in 1995, about 30 pack years     PMH : chronic diastolic heart failure,  paroxysmal atrial fibrillation, status post ablation in 08/10/2022 on Eliquis, history of coronary artery disease status post CABG,  chronic kidney see stage IIIa, hypothyroidism,   Chief Complaint  Patient presents with   Follow-up    FU PFT. SOB with exertion and prod cough with white sputum.    Discussed the use of AI scribe software for clinical note transcription with the patient, who gave verbal consent to proceed.  History of Present Illness   Marc Gilbert, a patient with a known diagnosis of Idiopathic Pulmonary Fibrosis (IPF) with chronic hypoxic respiratory failure, presents for a follow-up visit. He reports that he has recently started pulmonary rehabilitation and feels that it is benefiting him. He supplements his rehabilitation with additional gym workouts at his retirement facility. Despite his active lifestyle, he is struggling with the limitations of his current oxygen supply, which he requires due to his IPF. He is considering purchasing a new oxygen concentrator, understanding that it may not fully meet his oxygen needs during exertion. He has also started taking Ofev (nintedanib) for his IPF and is concerned about potential side effects, particularly those affecting the liver. He reports dryness and clogging in his nose due to the oxygen therapy.        Significant tests/ events reviewed  02/2023 Initial OV On ambulation oxygen saturation dropped on pulse O2 and required 6 L continuous to maintain about 88%  HRCT chest 01/2023 UIP pattern, progressive compared to 2020, chronic pancreatitis HRCT chest 03/2018 probable UIP pattern, no honeycombing   Echo 10/2022 RVSP 38, enlarged RV  PFTs  02/2023 severe restriction TLC 56%, FVC 75%, DLCO of 43%  Review of Systems neg for any significant sore throat, dysphagia, itching, sneezing, nasal congestion or excess/ purulent secretions, fever, chills, sweats, unintended wt loss, pleuritic or exertional cp, hempoptysis, orthopnea pnd or change in chronic leg swelling. Also denies presyncope, palpitations, heartburn, abdominal pain, nausea, vomiting, diarrhea or change in bowel or urinary habits, dysuria,hematuria, rash, arthralgias, visual complaints, headache, numbness weakness or ataxia.     Objective:   Physical Exam  Gen. Pleasant, well-nourished, in no distress, on O2 ENT - no thrush, no pallor/icterus,no post nasal drip Neck: No JVD, no thyromegaly, no carotid bruits Lungs: no use of accessory muscles, no dullness to percussion, BB  rales no rhonchi  Cardiovascular: Rhythm regular, heart sounds  normal, no murmurs or gallops, no peripheral edema Musculoskeletal: No deformities, no cyanosis or clubbing        Assessment & Plan:    Assessment and Plan    Idiopathic Pulmonary Fibrosis (IPF) Confirmed IPF with UIP pattern on CT scan. Progressive disease with PFTs showing total lung capacity at 55% and diffusion capacity at 45%. Currently on six liters of oxygen and pulmonary rehabilitation. Started Ofev (nintedanib) to slow progression. Discussed side effects, particularly diarrhea and liver issues, and the need for lifelong therapy. Emphasized monitoring liver function and managing side effects. - Continue Ofev (nintedanib) - Monitor liver function tests (LFTs) monthly for the first three months, then every three months - Encourage continuation of pulmonary rehabilitation - Educate on continuous oxygen therapy and limitations of portable concentrators - Sign authorization  for [employer] portable oxygen concentrator  Chronic Hypoxic Respiratory Failure Requires six liters of continuous oxygen due to IPF. Uses stationary  concentrator at home and portable concentrator for outside activities. Discussed limitations of portable concentrators and importance of continuous oxygen. - Continue six liters of continuous oxygen at home - Use portable oxygen concentrator for short distances and activities outside the home - Adjust oxygen flow rate based on activity level: reduce to two liters when sitting, increase to six liters when active - Educate on self-monitoring oxygen levels and adjusting flow rates  Dry Nasal Passages Reports dryness and clogging of nasal passages, likely due to high-flow oxygen therapy. - Use saline nasal spray regularly - Consider using saline gel for additional moisture  General Health Maintenance Actively participating in pulmonary rehabilitation and diligent with nebulizer treatments. Taking Mucinex for mucus management. - Continue pulmonary rehabilitation - Continue nebulizer treatments with albuterol in saline solution - Continue taking Mucinex as needed  Follow-up - Schedule follow-up appointment in one month to assess response to Ofev and review liver function tests - Perform liver function tests monthly for the first three months, then every three months.

## 2023-03-16 NOTE — Patient Instructions (Signed)
Full PFT Performed Today

## 2023-03-16 NOTE — Telephone Encounter (Signed)
I confirmed with Lowella Bandy that the form is there for Dr. Vassie Loll to sign  Routing to Dr Vassie Loll

## 2023-03-16 NOTE — Telephone Encounter (Signed)
Also, please see several messages on the PT Contact thru [facility]. [employer] calling again for 02 order to be signed and returned.

## 2023-03-17 ENCOUNTER — Telehealth: Payer: Self-pay | Admitting: Pulmonary Disease

## 2023-03-17 DIAGNOSIS — C44529 Squamous cell carcinoma of skin of other part of trunk: Secondary | ICD-10-CM | POA: Diagnosis not present

## 2023-03-17 DIAGNOSIS — L905 Scar conditions and fibrosis of skin: Secondary | ICD-10-CM | POA: Diagnosis not present

## 2023-03-17 DIAGNOSIS — D485 Neoplasm of uncertain behavior of skin: Secondary | ICD-10-CM | POA: Diagnosis not present

## 2023-03-17 NOTE — Telephone Encounter (Signed)
Faxed twice and got confirmation on both.

## 2023-03-17 NOTE — Telephone Encounter (Signed)
Manny from [employer] is calling for the form to be signed and returned for O2 concentrator Fax number [phone]

## 2023-03-17 NOTE — Telephone Encounter (Signed)
Working on getting it faxed. Office switched to onbase and no one has access right now.

## 2023-03-18 ENCOUNTER — Encounter (HOSPITAL_COMMUNITY)
Admission: RE | Admit: 2023-03-18 | Discharge: 2023-03-18 | Disposition: A | Discharge status: Home / Self Care | Payer: Medicare Other | Source: Ambulatory Visit | Attending: Pulmonary Disease | Admitting: Pulmonary Disease

## 2023-03-18 DIAGNOSIS — J849 Interstitial pulmonary disease, unspecified: Secondary | ICD-10-CM | POA: Diagnosis not present

## 2023-03-18 NOTE — Progress Notes (Signed)
Daily Session Note  Patient Details  Name: Marc Gilbert MRN: [id number] Date of Birth: 05-07-1930 Referring Provider:   Doristine Devoid Pulmonary Rehab Walk Test from 02/01/2023 in [facility]  Referring Provider Dr. Vassie Loll       Encounter Date: 03/18/2023  Check In:  Session Check In - 03/18/23 1324       Check-In   Supervising physician immediately available to respond to emergencies [facility] MD immediately available    Physician(s) Edd Fabian, NP    Location [facility]    Staff Present Essie Hart, RN, BSN;Tyiesha Brackney Katrinka Blazing, Zella Richer, MS, ACSM-CEP, Exercise Physiologist;Randi Idelle Crouch BS, ACSM-CEP, Exercise Physiologist    Virtual Visit No    Medication changes reported     No    Fall or balance concerns reported    Yes    Comments uses walker    Tobacco Cessation No Change    Warm-up and Cool-down Performed as group-led instruction   only   Resistance Training Performed Yes    VAD Patient? No    PAD/SET Patient? No      Pain Assessment   Currently in Pain? No/denies    Multiple Pain Sites No             Capillary Blood Glucose: No results found for this or any previous visit (from the past 24 hours).    Social History   Tobacco Use  Smoking Status Former   Current packs/day: 0.00   Average packs/day: 0.5 packs/day for 25.0 years (12.5 ttl pk-yrs)   Types: Cigarettes   Start date: 05/25/1963   Quit date: 05/24/1988   Years since quitting: 34.8  Smokeless Tobacco Never    Goals Met:  Proper associated with RPD/PD & O2 Sat Independence with exercise equipment Exercise tolerated well No report of concerns or symptoms today Strength training completed today  Goals Unmet:  Not Applicable  Comments: Service time is from 1309 to 1439.    Dr. Mechele Collin is Medical Director for Pulmonary Rehab at [facility].

## 2023-03-23 ENCOUNTER — Encounter (HOSPITAL_COMMUNITY)
Admission: RE | Admit: 2023-03-23 | Discharge: 2023-03-23 | Disposition: A | Discharge status: Home / Self Care | Payer: Medicare Other | Source: Ambulatory Visit | Attending: Pulmonary Disease | Admitting: Pulmonary Disease

## 2023-03-23 VITALS — Wt 198.2 lb

## 2023-03-23 DIAGNOSIS — J849 Interstitial pulmonary disease, unspecified: Secondary | ICD-10-CM | POA: Insufficient documentation

## 2023-03-23 NOTE — Progress Notes (Signed)
Daily Session Note  Patient Details  Name: Marc Gilbert MRN: [id number] Date of Birth: [birth year] Referring Provider:   Doristine Devoid Pulmonary Rehab Walk Test from 02/01/2023 in [facility]  Referring Provider Dr. Vassie Loll       Encounter Date: 03/23/2023  Check In:  Session Check In - 03/23/23 1328       Check-In   Supervising physician immediately available to respond to emergencies [facility] immediately available    Physician(s) Edd Fabian, NP    [facility]    Staff Present Essie Hart, RN, BSN;Casey Katrinka Blazing, Zella Richer, MS, ACSM-CEP, Exercise Physiologist;Randi Idelle Crouch BS, ACSM-CEP, Exercise Physiologist    Virtual Visit No    Medication changes reported     No    Fall or balance concerns reported    Yes    Comments uses walker    Tobacco Cessation No Change    Warm-up and Cool-down Performed as group-led instruction   only   Resistance Training Performed Yes    VAD Patient? No    PAD/SET Patient? No      Pain Assessment   Currently in Pain? No/denies    Multiple Pain Sites No             Capillary Blood Glucose: No results found for this or any previous visit (from the past 24 hours).   Exercise Prescription Changes - 03/23/23 1500       Response to Exercise   Blood Pressure (Admit) 118/56    Blood Pressure (Exercise) 122/68    Blood Pressure (Exit) 104/62    Heart Rate (Admit) 84 bpm    Heart Rate (Exercise) 109 bpm    Heart Rate (Exit) 90 bpm    Oxygen Saturation (Admit) 97 %   6L   Oxygen Saturation (Exercise) 84 %   84% on 8L, 88% on 15L   Oxygen Saturation (Exit) 95 %   6L   Rating of Perceived Exertion (Exercise) 15    Perceived Dyspnea (Exercise) 2    Duration Continue with 30 min of aerobic exercise without signs/symptoms of physical distress.    Intensity THRR unchanged      Progression   Progression Continue to progress workloads to maintain  intensity without signs/symptoms of physical distress.      Resistance Training   Training Prescription Yes    Weight black bands    Reps 10-15    Time 10 Minutes      Oxygen   Oxygen Continuous    Liters 6-15      Recumbant Bike   Level 3    RPM 72    Watts 41    Minutes 15    METs 3.2      NuStep   Level 5    SPM 84    Minutes 15    METs 3.1      Oxygen   Maintain Oxygen Saturation 88% or higher             Social History   Tobacco Use  Smoking Status Former   Current packs/day: 0.00   Average packs/day: 0.5 packs/day for 25.0 years (12.5 ttl pk-yrs)   Types: Cigarettes   Start date: 05/25/1963   Quit date: 05/24/1988   Years since quitting: 34.8  Smokeless Tobacco Never    Goals Met:  Independence with exercise equipment Exercise tolerated well No report of concerns or symptoms today Strength training  completed today  Goals Unmet:  Not Applicable  Comments: Service time is from 1311 to 1439    Dr. Mechele Collin is Medical Director for Pulmonary Rehab at [facility].

## 2023-03-25 ENCOUNTER — Encounter (HOSPITAL_COMMUNITY)
Admission: RE | Admit: 2023-03-25 | Discharge: 2023-03-25 | Disposition: A | Discharge status: Home / Self Care | Payer: Medicare Other | Source: Ambulatory Visit | Attending: Pulmonary Disease | Admitting: Pulmonary Disease

## 2023-03-25 VITALS — Wt 194.7 lb

## 2023-03-25 DIAGNOSIS — J849 Interstitial pulmonary disease, unspecified: Secondary | ICD-10-CM

## 2023-03-25 NOTE — Progress Notes (Signed)
 Daily Session Note  Patient Details  Name: Marc Gilbert MRN: [id number] Date of Birth: 1930/06/18 Referring Provider:   Conrad Ports Pulmonary Rehab Walk Test from 02/01/2023 in [facility]  Referring Provider Dr. Jude       Encounter Date: 03/25/2023  Check In:  Session Check In - 03/25/23 1329       Check-In   Supervising physician immediately available to respond to emergencies [facility] immediately available    Physician(s) Rosabel Mose, NP    [facility]    Staff Present Ronal Levin, RN, BSN;Casey Claudene, Neita Moats, MS, ACSM-CEP, Exercise Physiologist;Randi Midge BS, ACSM-CEP, Exercise Physiologist    Virtual Visit No    Medication changes reported     No    Fall or balance concerns reported    Yes    Comments uses walker    Tobacco Cessation No Change    Warm-up and Cool-down Performed as group-led instruction   only   Resistance Training Performed Yes    VAD Patient? No    PAD/SET Patient? No      Pain Assessment   Currently in Pain? No/denies    Multiple Pain Sites No             Capillary Blood Glucose: No results found for this or any previous visit (from the past 24 hours).    Social History   Tobacco Use  Smoking Status Former   Current packs/day: 0.00   Average packs/day: 0.5 packs/day for 25.0 years (12.5 ttl pk-yrs)   Types: Cigarettes   Start date: 05/25/1963   Quit date: 05/24/1988   Years since quitting: 34.8  Smokeless Tobacco Never    Goals Met:  Exercise tolerated well No report of concerns or symptoms today Strength training completed today  Goals Unmet:  O2 dropped to 86% on 8L, turned up to 10L O2 90%  Comments: Service time is from 1306 to 1438    Dr. Slater Staff is Medical Director for Pulmonary Rehab at [facility].

## 2023-03-25 NOTE — Progress Notes (Signed)
 Home Exercise Prescription I have reviewed a Home Exercise Prescription with Keaston L Giammona. He is currently doing seated resistance machines at his living facility. Unfortunately he requires 8-15L oxygen  for aerobic exercise (walking and recumbent bike) and he does not have equipment for 8-15L at home. Pt is not interested in carrying tanks. Therefore aerobic exercise could not be prescribed at this time. Encouraged pt to continue doing his resistance training with supervision and monitoring his SpO2, maintaining >88. The patient stated that their goals were to achieve 89 years old. We reviewed exercise guidelines, target heart rate during exercise, RPE Scale, weather conditions, endpoints for exercise, warmup and cool down. The patient is encouraged to come to me with any questions. I will continue to follow up with the patient to assist them with progression and safety.  Spent 15 min discussing home exercise plan and goals.  Devonn Giampietro Gordon, [place], ACSM-CEP 03/25/2023 3:41 PM

## 2023-03-30 ENCOUNTER — Encounter (HOSPITAL_COMMUNITY)
Admission: RE | Admit: 2023-03-30 | Discharge: 2023-03-30 | Disposition: A | Discharge status: Home / Self Care | Payer: Medicare Other | Source: Ambulatory Visit | Attending: Pulmonary Disease

## 2023-03-30 DIAGNOSIS — J849 Interstitial pulmonary disease, unspecified: Secondary | ICD-10-CM | POA: Diagnosis not present

## 2023-03-30 NOTE — Progress Notes (Signed)
Daily Session Note  Patient Details  Name: Marc Gilbert MRN: [id number] Date of Birth: [birth year] Referring Provider:   Doristine Devoid Pulmonary Rehab Walk Test from 02/01/2023 in [facility], V[facility], [facility]  Referring Provider Dr. Vassie Loll       Encounter Date: 03/30/2023  Check In:  Session Check In - 03/30/23 1329       Check-In   Supervising physician immediately available to respond to emergencies [facility] immediately available    Physician(s) Hoover Browns, NP    [facility]    Staff Present Essie Hart, RN, BSN;Eleri Ruben Katrinka Blazing, Zella Richer, MS, ACSM-CEP, Exercise Physiologist;Randi Idelle Crouch BS, ACSM-CEP, Exercise Physiologist    Virtual Visit No    Medication changes reported     No    Fall or balance concerns reported    Yes    Comments uses walker    Tobacco Cessation No Change    Warm-up and Cool-down Performed as group-led instruction   only   Resistance Training Performed Yes    VAD Patient? No    PAD/SET Patient? No      Pain Assessment   Currently in Pain? No/denies    Multiple Pain Sites No             Capillary Blood Glucose: No results found for this or any previous visit (from the past 24 hours).    Social History   Tobacco Use  Smoking Status Former   Current packs/day: 0.00   Average packs/day: 0.5 packs/day for 25.0 years (12.5 ttl pk-yrs)   Types: Cigarettes   Start date: 05/25/1963   Quit date: 05/24/1988   Years since quitting: 34.8  Smokeless Tobacco Never    Goals Met:  Proper associated with RPD/PD & O2 Sat Independence with exercise equipment Exercise tolerated well No report of concerns or symptoms today Strength training completed today  Goals Unmet:  Not Applicable  Comments: Service time is from 1308 to 1435.    Dr. Mechele Collin is Medical Director for Pulmonary Rehab at [facility].

## 2023-04-01 ENCOUNTER — Encounter (HOSPITAL_COMMUNITY)
Admission: RE | Admit: 2023-04-01 | Discharge: 2023-04-01 | Disposition: A | Discharge status: Home / Self Care | Payer: Medicare Other | Source: Ambulatory Visit | Attending: Pulmonary Disease

## 2023-04-01 DIAGNOSIS — J849 Interstitial pulmonary disease, unspecified: Secondary | ICD-10-CM

## 2023-04-01 NOTE — Progress Notes (Signed)
Daily Session Note  Patient Details  Name: Marc Gilbert MRN: [id number] Date of Birth: 12-Oct-1930 Referring Provider:   Doristine Devoid Pulmonary Rehab Walk Test from 02/01/2023 in [facility], V[facility], [facility]  Referring Provider Dr. Vassie Loll       Encounter Date: 04/01/2023  Check In:  Session Check In - 04/01/23 1344       Check-In   Supervising physician immediately available to respond to emergencies [facility] immediately available    Physician(s) Rise Paganini, NP    [facility]    Staff Present Essie Hart, RN, BSN;Casey Charlean Sanfilippo, MS, ACSM-CEP, Exercise Physiologist    Virtual Visit No    Medication changes reported     No    Fall or balance concerns reported    Yes    Comments uses walker    Tobacco Cessation No Change    Warm-up and Cool-down Performed as group-led instruction   only   Resistance Training Performed Yes    VAD Patient? No    PAD/SET Patient? No      Pain Assessment   Currently in Pain? No/denies    Multiple Pain Sites No             Capillary Blood Glucose: No results found for this or any previous visit (from the past 24 hours).    Social History   Tobacco Use  Smoking Status Former   Current packs/day: 0.00   Average packs/day: 0.5 packs/day for 25.0 years (12.5 ttl pk-yrs)   Types: Cigarettes   Start date: 05/25/1963   Quit date: 05/24/1988   Years since quitting: 34.8  Smokeless Tobacco Never    Goals Met:  Independence with exercise equipment Exercise tolerated well No report of concerns or symptoms today Strength training completed today  Goals Unmet:  Not Applicable  Comments: Service time is from 1314 to 1448    Dr. Mechele Collin is Medical Director for Pulmonary Rehab at [facility].

## 2023-04-06 ENCOUNTER — Encounter (HOSPITAL_COMMUNITY)
Admission: RE | Admit: 2023-04-06 | Discharge: 2023-04-06 | Disposition: A | Discharge status: Home / Self Care | Payer: Medicare Other | Source: Ambulatory Visit | Attending: Pulmonary Disease | Admitting: Pulmonary Disease

## 2023-04-06 DIAGNOSIS — J849 Interstitial pulmonary disease, unspecified: Secondary | ICD-10-CM

## 2023-04-06 NOTE — Progress Notes (Signed)
Daily Session Note  Patient Details  Name: Marc Gilbert MRN: [id number] Date of Birth: 1930-03-07 Referring Provider:   Doristine Devoid Pulmonary Rehab Walk Test from 02/01/2023 in [facility] V[facility], [facility]  Referring Provider Dr. Vassie Loll       Encounter Date: 03/25/2023  Check In:   Capillary Blood Glucose: No results found for this or any previous visit (from the past 24 hours).   Exercise Prescription Changes - 04/06/23 1500       Response to Exercise   Blood Pressure (Admit) 102/64    Blood Pressure (Exercise) 128/64    Blood Pressure (Exit) 100/60    Heart Rate (Admit) 79 bpm    Heart Rate (Exercise) 108 bpm    Heart Rate (Exit) 82 bpm    Oxygen Saturation (Admit) 98 %   6L   Oxygen Saturation (Exercise) 83 %   6L; REFUSED TO INCREASE O2, STOPPED EXERCISING, O2 RECOVERED TO 93%   Oxygen Saturation (Exit) 96 %   6l   Rating of Perceived Exertion (Exercise) 14    Perceived Dyspnea (Exercise) 3    Duration Continue with 30 min of aerobic exercise without signs/symptoms of physical distress.    Intensity THRR unchanged      Progression   Progression Continue to progress workloads to maintain intensity without signs/symptoms of physical distress.      Resistance Training   Training Prescription Yes    Weight black bands    Reps 10-15    Time 10 Minutes      Oxygen   Oxygen Continuous    Liters 6-8      Recumbant Bike   Level 3    RPM 65    Minutes 15    METs 3.2      NuStep   Level 5    SPM 81    Minutes 15    METs 3.2      Oxygen   Maintain Oxygen Saturation 88% or higher             Social History   Tobacco Use  Smoking Status Former   Current packs/day: 0.00   Average packs/day: 0.5 packs/day for 25.0 years (12.5 ttl pk-yrs)   Types: Cigarettes   Start date: 05/25/1963   Quit date: 05/24/1988   Years since quitting: 34.8  Smokeless Tobacco Never    Goals Met:  Independence with exercise  equipment Exercise tolerated well No report of concerns or symptoms today Strength training completed today  Goals Unmet:  PT DESATURATED TO 83%, REFUSED TO LET STAFF INCREASE HIS O2. MADE PT STOP EXERCISING, O2 RECOVERED TO 93%  Comments: Service time is from 1309 to 1441    Dr. Mechele Collin is Medical Director for Pulmonary Rehab at [facility].

## 2023-04-07 DIAGNOSIS — C44629 Squamous cell carcinoma of skin of left upper limb, including shoulder: Secondary | ICD-10-CM | POA: Diagnosis not present

## 2023-04-07 NOTE — Progress Notes (Signed)
Pulmonary Individual Treatment Plan  Patient Details  Name: Marc Gilbert MRN: [id number] Date of Birth: 02-29-1930 Referring Provider:   Doristine Devoid Pulmonary Rehab Walk Test from 02/01/2023 in [facility]  Referring Provider Dr. Vassie Loll       Initial Encounter Date:  Flowsheet Row Pulmonary Rehab Walk Test from 02/01/2023 in [facility]  Date 02/01/23       Visit Diagnosis: ILD (interstitial lung disease) (HCC)  Patient's Home Medications on Admission:   Current Outpatient Medications:    acetaminophen (TYLENOL) 500 MG tablet, Take 1,000 mg by mouth every 6 (six) hours as needed for mild pain or moderate pain (for pain)., Disp: , Rfl:    albuterol (PROVENTIL) (5 MG/ML) 0.5% nebulizer solution, Take 0.5 mLs (2.5 mg total) by nebulization every 6 (six) hours as needed for wheezing or shortness of breath., Disp: 20 mL, Rfl: 12   apixaban (ELIQUIS) 5 MG TABS tablet, Take 1 tablet (5 mg total) by mouth 2 (two) times daily., Disp: 60 tablet, Rfl: 6   atorvastatin (LIPITOR) 40 MG tablet, Take 1 tablet (40 mg total) by mouth at bedtime., Disp: 90 tablet, Rfl: 3   carvedilol (COREG) 3.125 MG tablet, Take 1 tablet (3.125 mg total) by mouth 2 (two) times daily., Disp: 180 tablet, Rfl: 3   diclofenac Sodium (VOLTAREN) 1 % GEL, Apply 1 application  topically See admin instructions. Apply 2-4 grams of gel topically to both knees once a day, Disp: , Rfl:    docusate sodium (COLACE) 250 MG capsule, Take 250 mg by mouth daily. (Patient not taking: Reported on 03/16/2023), Disp: , Rfl:    empagliflozin (JARDIANCE) 10 MG TABS tablet, Take 1 tablet (10 mg total) by mouth daily., Disp: 90 tablet, Rfl: 3   furosemide (LASIX) 20 MG tablet, Take 1 tablet (20 mg total) by mouth daily for 10 days. (Patient taking differently: Take 20 mg by mouth daily as needed for fluid or edema.), Disp: 30  tablet, Rfl: 0   gabapentin (NEURONTIN) 300 MG capsule, Take 300-600 mg by mouth See admin instructions. Take 300 mg by mouth between 3 AM-4 AM, 600 mg by mouth after breakfast, 600 mg between 3 PM-4 PM, and 300 mg at bedtime, Disp: , Rfl:    Glycerin-Hypromellose-PEG 400 (DRY EYE RELIEF DROPS) 0.2-0.2-1 % SOLN, Place 1 drop into both eyes 3 (three) times daily as needed (for dryness)., Disp: , Rfl:    guaiFENesin (MUCINEX) 600 MG 12 hr tablet, Take by mouth 2 (two) times daily., Disp: , Rfl:    INCRUSE ELLIPTA 62.5 MCG/ACT AEPB, 1 puff daily., Disp: , Rfl:    levothyroxine (SYNTHROID) 50 MCG tablet, Take 50 mcg by mouth daily before breakfast., Disp: , Rfl:    Melatonin 10 MG TABS, Take 10 mg by mouth at bedtime., Disp: , Rfl:    Multiple Vitamins-Minerals (ONE-A-DAY MENS 50+ ADVANTAGE) TABS, Take 1 tablet by mouth daily with breakfast., Disp: , Rfl:    Nintedanib (OFEV) 150 MG CAPS, Take 1 capsule (150 mg total) by mouth 2 (two) times daily., Disp: 180 capsule, Rfl: 1   Omega-3 Fatty Acids (FISH OIL OMEGA-3 PO), Take 1,960 mg by mouth daily., Disp: , Rfl:    ondansetron (ZOFRAN) 4 MG tablet, Take 1 tablet (4 mg total) by mouth every 8 (eight) hours as needed for nausea or vomiting., Disp: 20 tablet, Rfl: 0   oxybutynin (DITROPAN)  5 MG tablet, Take 5 mg by mouth at bedtime., Disp: , Rfl:    polyethylene glycol (MIRALAX / GLYCOLAX) 17 g packet, Take 8.5 g by mouth daily as needed for mild constipation or moderate constipation. (Patient not taking: Reported on 03/16/2023), Disp: , Rfl:    sodium chloride (OCEAN) 0.65 % SOLN nasal spray, Place 1 spray into both nostrils as needed for congestion., Disp: , Rfl:    sodium chloride HYPERTONIC 3 % nebulizer solution, Take by nebulization daily as needed for other., Disp: 750 mL, Rfl: 12   SPIRIVA RESPIMAT 2.5 MCG/ACT AERS, SMARTSIG:2 Puff(s) Via Inhaler Daily (Patient not taking: Reported on 03/16/2023), Disp: , Rfl:    Tamsulosin HCl (FLOMAX) 0.4 MG CAPS,  Take 0.4 mg by mouth at bedtime., Disp: , Rfl:    testosterone cypionate (DEPOTESTOSTERONE CYPIONATE) 200 MG/ML injection, Inject 66.67 mg into the skin every Wednesday., Disp: , Rfl:   Past Medical History: Past Medical History:  Diagnosis Date   Arthritis    "right hand; back" (08/30/2015)   Cancer (HCC)    skin  squamous and basal   Chronic lower back pain    CKD (chronic kidney disease), stage III (HCC)    Stage III   COPD (chronic obstructive pulmonary disease) (HCC)    no home O2   Coronary artery disease    Dysplastic colon polyp age 41   carcinoma in situ   GERD (gastroesophageal reflux disease)    occ   H/O hiatal hernia    Hyperlipidemia    Hypertension    Prostate cancer (HCC) 07/2014   active surveillance, Glisson 6    Tobacco Use: Social History   Tobacco Use  Smoking Status Former   Current packs/day: 0.00   Average packs/day: 0.5 packs/day for 25.0 years (12.5 ttl pk-yrs)   Types: Cigarettes   Start date: 05/25/1963   Quit date: 05/24/1988   Years since quitting: 34.8  Smokeless Tobacco Never    Labs: Review Flowsheet       Latest Ref Rng & Units 08/10/2016 08/14/2016 08/15/2016 10/30/2022  Labs for ITP Cardiac and Pulmonary Rehab  Cholestrol 0 - 200 mg/dL 469  - - -  LDL (calc) 0 - 99 mg/dL 87  - - -  HDL-C >62 mg/dL 56  - - -  Trlycerides <150 mg/dL 72  - - -  Hemoglobin X5M 4.8 - 5.6 % 5.7  - - -  PH, Arterial 7.350 - 7.450 - 7.338  7.341  7.387  7.472  7.349  - -  PCO2 arterial 32.0 - 48.0 mmHg - 37.1  34.8  37.6  35.8  49.8  - -  Bicarbonate 20.0 - 28.0 mmol/L - 19.9  18.8  22.9  26.2  27.4  - 27.8   TCO2 0 - 100 mmol/L - 21  20  20  24  27  28  29  28  27  27  25   -  Acid-base deficit 0.0 - 2.0 mmol/L - 5.0  6.0  2.0  - -  O2 Saturation % - 96.0  96.0  99.0  100.0  100.0  - 32     Details       Multiple values from one day are sorted in reverse-chronological order         Capillary Blood Glucose: Lab Results  Component Value Date    GLUCAP 147 (H) 08/16/2016   GLUCAP 128 (H) 08/16/2016   GLUCAP 124 (H) 08/16/2016   GLUCAP 110 (H)  08/16/2016   GLUCAP 114 (H) 08/16/2016     Pulmonary Assessment Scores:  Pulmonary Assessment Scores     Row Name 02/01/23 1514         ADL UCSD   ADL Phase Entry     SOB Score total 38       CAT Score   CAT Score 11       mMRC Score   mMRC Score 3             UCSD: Self-administered rating of dyspnea associated with activities of daily living (ADLs) 6-point scale (0 = "not at all" to 5 = "maximal or unable to do because of breathlessness")  Scoring Scores range from 0 to 120.  Minimally important difference is 5 units  CAT: CAT can identify the health impairment of COPD patients and is better correlated with disease progression.  CAT has a scoring range of zero to 40. The CAT score is classified into four groups of low (less than 10), medium (10 - 20), high (21-30) and very high (31-40) based on the impact level of disease on health status. A CAT score over 10 suggests significant symptoms.  A worsening CAT score could be explained by an exacerbation, poor medication adherence, poor inhaler technique, or progression of COPD or comorbid conditions.  CAT MCID is 2 points  mMRC: mMRC (Modified Medical Research Council) Dyspnea Scale is used to assess the degree of baseline functional disability in patients of respiratory disease due to dyspnea. No minimal important difference is established. A decrease in score of 1 point or greater is considered a positive change.   Pulmonary Function Assessment:  Pulmonary Function Assessment - 02/01/23 1329       Breath   Bilateral Breath Sounds Decreased    Shortness of Breath Yes;Limiting activity;Fear of Shortness of Breath             Exercise Target Goals: Exercise Program Goal: Individual exercise prescription set using results from initial 6 min walk test and THRR while considering  patient's activity barriers and  safety.   Exercise Prescription Goal: Initial exercise prescription builds to 30-45 minutes a day of aerobic activity, 2-3 days per week.  Home exercise guidelines will be given to patient during program as part of exercise prescription that the participant will acknowledge.  Activity Barriers & Risk Stratification:  Activity Barriers & Cardiac Risk Stratification - 02/01/23 1328       Activity Barriers & Cardiac Risk Stratification   Activity Barriers Deconditioning;Muscular Weakness;Shortness of Breath;Assistive Device             6 Minute Walk:  6 Minute Walk     Row Name 02/01/23 1512         6 Minute Walk   Phase Initial     Distance 600 feet     Walk Time 6 minutes     # of Rest Breaks 2     MPH 1.14     METS 0.55     RPE 12     Perceived Dyspnea  1     VO2 Peak 1.93     Symptoms No     Resting HR 84 bpm     Resting BP 106/60     Resting Oxygen Saturation  98 %     Exercise Oxygen Saturation  during 6 min walk 86 %     Max Ex. HR 111 bpm     Max Ex. BP 116/70  2 Minute Post BP 104/54       Interval HR   1 Minute HR 85     2 Minute HR 91     3 Minute HR 111     4 Minute HR 99     5 Minute HR 104     6 Minute HR 104     2 Minute Post HR 89     Interval Heart Rate? Yes       Interval Oxygen   Interval Oxygen? Yes     Baseline Oxygen Saturation % 98 %     1 Minute Oxygen Saturation % 95 %     1 Minute Liters of Oxygen 6 L     2 Minute Oxygen Saturation % 92 %     2 Minute Liters of Oxygen 6 L     3 Minute Oxygen Saturation % 86 %  2:30-2:50 increased O2 to 8L     3 Minute Liters of Oxygen 8 L     4 Minute Oxygen Saturation % 86 %  3:30-4:00 increased O2 to 10L     4 Minute Liters of Oxygen 10 L     5 Minute Oxygen Saturation % 91 %     5 Minute Liters of Oxygen 15 L     6 Minute Oxygen Saturation % 93 %     6 Minute Liters of Oxygen 15 L     2 Minute Post Oxygen Saturation % 95 %     2 Minute Post Liters of Oxygen 6 L               Oxygen Initial Assessment:  Oxygen Initial Assessment - 02/01/23 1328       Home Oxygen   Home Oxygen Device Portable Concentrator;Home Concentrator   in process of getting tanks   Sleep Oxygen Prescription Continuous    Liters per minute 5    Home Exercise Oxygen Prescription Continuous    Liters per minute 5    Home Resting Oxygen Prescription Continuous    Liters per minute 5    Compliance with Home Oxygen Use Yes      Initial 6 min Walk   Oxygen Used Continuous    Liters per minute 15      Program Oxygen Prescription   Program Oxygen Prescription Continuous    Liters per minute 15      Intervention   Short Term Goals To learn and exhibit compliance with exercise, home and travel O2 prescription;To learn and understand importance of maintaining oxygen saturations>88%;To learn and understand importance of monitoring SPO2 with pulse oximeter and demonstrate accurate use of the pulse oximeter.;To learn and demonstrate proper pursed lip breathing techniques or other breathing techniques.     Long  Term Goals Exhibits compliance with exercise, home  and travel O2 prescription;Exhibits proper breathing techniques, such as pursed lip breathing or other method taught during program session;Verbalizes importance of monitoring SPO2 with pulse oximeter and return demonstration;Maintenance of O2 saturations>88%             Oxygen Re-Evaluation:  Oxygen Re-Evaluation     Row Name 02/08/23 1205 02/26/23 1143 03/31/23 1432         Program Oxygen Prescription   Program Oxygen Prescription Continuous Continuous Continuous     Liters per minute 15 10 10      Comments -- 8-10 L walking track, 6-8 on recumbent bike 8-10 L walking track, 6-8 on recumbent bike mostly. Last session only 6L  for both       Home Oxygen   Home Oxygen Device Portable Concentrator;Home Concentrator  in process of getting tanks Portable Concentrator;Home Concentrator  in process of getting tanks Portable  Concentrator;Home Concentrator  in process of getting tanks     Sleep Oxygen Prescription Continuous Continuous Continuous     Liters per minute 5 5 5      Home Exercise Oxygen Prescription Continuous Continuous Continuous     Liters per minute 5 5 5      Home Resting Oxygen Prescription Continuous Continuous Continuous     Liters per minute 5 5 5      Compliance with Home Oxygen Use Yes Yes Yes       Goals/Expected Outcomes   Short Term Goals To learn and exhibit compliance with exercise, home and travel O2 prescription;To learn and understand importance of maintaining oxygen saturations>88%;To learn and understand importance of monitoring SPO2 with pulse oximeter and demonstrate accurate use of the pulse oximeter.;To learn and demonstrate proper pursed lip breathing techniques or other breathing techniques.  To learn and exhibit compliance with exercise, home and travel O2 prescription;To learn and understand importance of maintaining oxygen saturations>88%;To learn and understand importance of monitoring SPO2 with pulse oximeter and demonstrate accurate use of the pulse oximeter.;To learn and demonstrate proper pursed lip breathing techniques or other breathing techniques. ;To learn and demonstrate proper use of respiratory medications To learn and exhibit compliance with exercise, home and travel O2 prescription;To learn and understand importance of maintaining oxygen saturations>88%;To learn and understand importance of monitoring SPO2 with pulse oximeter and demonstrate accurate use of the pulse oximeter.;To learn and demonstrate proper pursed lip breathing techniques or other breathing techniques. ;To learn and demonstrate proper use of respiratory medications     Long  Term Goals Exhibits compliance with exercise, home  and travel O2 prescription;Exhibits proper breathing techniques, such as pursed lip breathing or other method taught during program session;Verbalizes importance of monitoring SPO2  with pulse oximeter and return demonstration;Maintenance of O2 saturations>88% Exhibits compliance with exercise, home  and travel O2 prescription;Exhibits proper breathing techniques, such as pursed lip breathing or other method taught during program session;Verbalizes importance of monitoring SPO2 with pulse oximeter and return demonstration;Maintenance of O2 saturations>88% Exhibits compliance with exercise, home  and travel O2 prescription;Exhibits proper breathing techniques, such as pursed lip breathing or other method taught during program session;Verbalizes importance of monitoring SPO2 with pulse oximeter and return demonstration;Maintenance of O2 saturations>88%     Comments -- Pt struggles to accept his O2 needs. Plan to try forehead probe while walking to ensure accuracy. Pt struggles to accept his O2 needs despite explanations. He is unwilling to consider tanks for home and wants to continue with his POC despite its inability to provide enough O2 at home with activity/exercise.     Goals/Expected Outcomes Compliance and understanding of oxygen saturation monitoring and breathing techniques to decrease shortness of breath. Compliance and understanding of oxygen saturation monitoring and breathing techniques to decrease shortness of breath. Compliance and understanding of oxygen saturation monitoring and breathing techniques to decrease shortness of breath.              Oxygen Discharge (Final Oxygen Re-Evaluation):  Oxygen Re-Evaluation - 03/31/23 1432       Program Oxygen Prescription   Program Oxygen Prescription Continuous    Liters per minute 10    Comments 8-10 L walking track, 6-8 on recumbent bike mostly. Last session only 6L for both  Home Oxygen   Home Oxygen Device Portable Concentrator;Home Concentrator   in process of getting tanks   Sleep Oxygen Prescription Continuous    Liters per minute 5    Home Exercise Oxygen Prescription Continuous    Liters per minute 5     Home Resting Oxygen Prescription Continuous    Liters per minute 5    Compliance with Home Oxygen Use Yes      Goals/Expected Outcomes   Short Term Goals To learn and exhibit compliance with exercise, home and travel O2 prescription;To learn and understand importance of maintaining oxygen saturations>88%;To learn and understand importance of monitoring SPO2 with pulse oximeter and demonstrate accurate use of the pulse oximeter.;To learn and demonstrate proper pursed lip breathing techniques or other breathing techniques. ;To learn and demonstrate proper use of respiratory medications    Long  Term Goals Exhibits compliance with exercise, home  and travel O2 prescription;Exhibits proper breathing techniques, such as pursed lip breathing or other method taught during program session;Verbalizes importance of monitoring SPO2 with pulse oximeter and return demonstration;Maintenance of O2 saturations>88%    Comments Pt struggles to accept his O2 needs despite explanations. He is unwilling to consider tanks for home and wants to continue with his POC despite its inability to provide enough O2 at home with activity/exercise.    Goals/Expected Outcomes Compliance and understanding of oxygen saturation monitoring and breathing techniques to decrease shortness of breath.             Initial Exercise Prescription:  Initial Exercise Prescription - 02/01/23 1500       Date of Initial Exercise RX and Referring Provider   Date 02/01/23    Referring Provider Dr. Vassie Loll    Expected Discharge Date 04/29/23      Oxygen   Oxygen Continuous    Liters 6-15    Maintain Oxygen Saturation 88% or higher      Recumbant Bike   Level 1    Minutes 15    METs 1.6      Recumbant Elliptical   Level 1    Minutes 15    METs 1.5      Prescription Details   Frequency (times per week) 2    Duration Progress to 30 minutes of continuous aerobic without signs/symptoms of physical distress      Intensity   THRR  40-80% of Max Heartrate 52-104    Ratings of Perceived Exertion 11-13    Perceived Dyspnea 0-4      Progression   Progression Continue to progress workloads to maintain intensity without signs/symptoms of physical distress.      Resistance Training   Training Prescription Yes    Weight red bands    Reps 10-15             Perform Capillary Blood Glucose checks as needed.  Exercise Prescription Changes:   Exercise Prescription Changes     Row Name 02/23/23 1400 03/09/23 1400 03/23/23 1500 03/25/23 1500 04/06/23 1500     Response to Exercise   Blood Pressure (Admit) 102/58 108/61 118/56 -- 102/64   Blood Pressure (Exercise) 122/62 114/62 122/68 -- 128/64   Blood Pressure (Exit) 118/66 110/60 104/62 -- 100/60   Heart Rate (Admit) 77 bpm 80 bpm 84 bpm -- 79 bpm   Heart Rate (Exercise) 96 bpm 92 bpm 109 bpm -- 108 bpm   Heart Rate (Exit) 81 bpm 86 bpm 90 bpm -- 82 bpm   Oxygen Saturation (Admit) 100 % 97 %  97 %  6L -- 98 %  6L   Oxygen Saturation (Exercise) 88 % 95 % 84 %  84% on 8L, 88% on 15L -- 83 %  6L; REFUSED TO INCREASE O2, STOPPED EXERCISING, O2 RECOVERED TO 93%   Oxygen Saturation (Exit) 97 % 95 % 95 %  6L -- 96 %  6l   Rating of Perceived Exertion (Exercise) 13 13 15  -- 14   Perceived Dyspnea (Exercise) 2 2 2  -- 3   Duration Continue with 30 min of aerobic exercise without signs/symptoms of physical distress. Continue with 30 min of aerobic exercise without signs/symptoms of physical distress. Continue with 30 min of aerobic exercise without signs/symptoms of physical distress. -- Continue with 30 min of aerobic exercise without signs/symptoms of physical distress.   Intensity THRR unchanged THRR unchanged THRR unchanged -- THRR unchanged     Progression   Progression -- Continue to progress workloads to maintain intensity without signs/symptoms of physical distress. Continue to progress workloads to maintain intensity without signs/symptoms of physical distress. --  Continue to progress workloads to maintain intensity without signs/symptoms of physical distress.     Resistance Training   Training Prescription Yes Yes Yes -- Yes   Weight red bands red bands black bands -- black bands   Reps 10-15 10-15 10-15 -- 10-15   Time 10 Minutes 10 Minutes 10 Minutes -- 10 Minutes     Oxygen   Oxygen Continuous Continuous Continuous -- Continuous   Liters 6-15 6-15 6-15 -- 6-8     Recumbant Bike   Level 2 2 3  -- 3   RPM -- 80 72 -- 65   Watts -- 28 41 -- --   Minutes 15 15 15  -- 15   METs 2.3 2.6 3.2 -- 3.2     NuStep   Level -- 5 5 -- 5   SPM -- 69 84 -- 81   Minutes -- 15 15 -- 15   METs -- 2.5 3.1 -- 3.2     Track   Laps 7 -- -- -- --   Minutes 15 -- -- -- --   METs 2.08 -- -- -- --     Home Exercise Plan   Plans to continue exercise at -- -- -- Home (comment) --   Initial Home Exercises Provided -- -- -- 03/25/23 --     Oxygen   Maintain Oxygen Saturation 88% or higher 88% or higher 88% or higher -- 88% or higher            Exercise Comments:   Exercise Comments     Row Name 02/11/23 1506 03/25/23 1534         Exercise Comments Pt completed first day of group exercise. He exercised on the recumbent elliptical for 15 min, level 2, METs 2.4. He then exercised on the recumbent bike for 15 min, level 2, METs 2.1. Pt performed warm up and cool down exercises standing, including squats, holding to his rollator. He began on red bands however they were too easy therefore increased to blue bands.  Discussed METs. Discussed with pt home exercise plan. He is currently doing seated resistance machines at his living facility. Unfortunately he requires 8-15L oxygen for aerobic exercise (walking and recumbent bike) and he does not have equipment for 8-15L at home. Pt is not interested in carrying tanks. Therefore aerobic exercise could not be prescribed at this time. Encouraged pt to continue doing his resistance training with supervision and  monitoring  his SpO2, maintaining >88.               Exercise Goals and Review:   Exercise Goals     Row Name 02/01/23 1328             Exercise Goals   Increase Physical Activity Yes       Intervention Provide advice, education, support and counseling about physical activity/exercise needs.;Develop an individualized exercise prescription for aerobic and resistive training based on initial evaluation findings, risk stratification, comorbidities and participant's personal goals.       Expected Outcomes Short Term: Attend rehab on a regular basis to increase amount of physical activity.;Long Term: Exercising regularly at least 3-5 days a week.;Long Term: Add in home exercise to make exercise part of routine and to increase amount of physical activity.       Increase Strength and Stamina Yes       Intervention Provide advice, education, support and counseling about physical activity/exercise needs.;Develop an individualized exercise prescription for aerobic and resistive training based on initial evaluation findings, risk stratification, comorbidities and participant's personal goals.       Expected Outcomes Short Term: Increase workloads from initial exercise prescription for resistance, speed, and METs.;Short Term: Perform resistance training exercises routinely during rehab and add in resistance training at home;Long Term: Improve cardiorespiratory fitness, muscular endurance and strength as measured by increased METs and functional capacity ( )       Able to understand and use rate of perceived exertion (RPE) scale Yes       Intervention Provide education and explanation on how to use RPE scale       Expected Outcomes Short Term: Able to use RPE daily in rehab to express subjective intensity level;Long Term:  Able to use RPE to guide intensity level when exercising independently       Able to understand and use Dyspnea scale Yes       Intervention Provide education and explanation on  how to use Dyspnea scale       Expected Outcomes Short Term: Able to use Dyspnea scale daily in rehab to express subjective sense of shortness of breath during exertion;Long Term: Able to use Dyspnea scale to guide intensity level when exercising independently       Knowledge and understanding of Target Heart Rate Range (THRR) Yes       Intervention Provide education and explanation of THRR including how the numbers were predicted and where they are located for reference       Expected Outcomes Short Term: Able to state/look up THRR;Short Term: Able to use daily as guideline for intensity in rehab;Long Term: Able to use THRR to govern intensity when exercising independently       Understanding of Exercise Prescription Yes       Intervention Provide education, explanation, and written materials on patient's individual exercise prescription       Expected Outcomes Short Term: Able to explain program exercise prescription;Long Term: Able to explain home exercise prescription to exercise independently                Exercise Goals Re-Evaluation :  Exercise Goals Re-Evaluation     Row Name 02/08/23 1204 02/26/23 1141 03/31/23 1437         Exercise Goal Re-Evaluation   Exercise Goals Review Increase Physical Activity;Increase Strength and Stamina;Able to understand and use rate of perceived exertion (RPE) scale;Able to understand and use Dyspnea scale;Knowledge and understanding of Target Heart Rate Range (  THRR);Understanding of Exercise Prescription Increase Physical Activity;Increase Strength and Stamina;Able to understand and use rate of perceived exertion (RPE) scale;Able to understand and use Dyspnea scale;Knowledge and understanding of Target Heart Rate Range (THRR);Understanding of Exercise Prescription Increase Physical Activity;Increase Strength and Stamina;Able to understand and use rate of perceived exertion (RPE) scale;Able to understand and use Dyspnea scale;Knowledge and understanding  of Target Heart Rate Range (THRR);Understanding of Exercise Prescription     Comments Pt to begin exercise 12/24. Will monitor for progression. Pt has completed 5 exercise sessions with perfect attendance so far. He is walking the track with a walker for 15 min, 2.38 METs. He then is exercising on the recumbent bike, level 2, METs 2.5. He tolerates well but needs increased O2. Performs warm up and cool down independently standing, including squats. Will monitor for progression. He is using blue bands. Pt has completed 13 exercise sessions with perfect attendance. He needed to change from the track due to hip pain. He is now exercising on the recumbent stepper for 15 min, level 5, METs 2.5. He then is exercising on the recumbent bike, level 3, METs 3.0. He tolerates well but needs increased O2. Performs warm up and cool down independently standing, including squats. Will monitor for progression. He is using black bands.     Expected Outcomes Through exercise at rehab and home, the patient will decrease shortness of breath with daily activities and feel confident in carrying out an exercise regimen at home. Through exercise at rehab and home, the patient will decrease shortness of breath with daily activities and feel confident in carrying out an exercise regimen at home. Through exercise at rehab and home, the patient will decrease shortness of breath with daily activities and feel confident in carrying out an exercise regimen at home.              Discharge Exercise Prescription (Final Exercise Prescription Changes):  Exercise Prescription Changes - 04/06/23 1500       Response to Exercise   Blood Pressure (Admit) 102/64    Blood Pressure (Exercise) 128/64    Blood Pressure (Exit) 100/60    Heart Rate (Admit) 79 bpm    Heart Rate (Exercise) 108 bpm    Heart Rate (Exit) 82 bpm    Oxygen Saturation (Admit) 98 %   6L   Oxygen Saturation (Exercise) 83 %   6L; REFUSED TO INCREASE O2, STOPPED  EXERCISING, O2 RECOVERED TO 93%   Oxygen Saturation (Exit) 96 %   6l   Rating of Perceived Exertion (Exercise) 14    Perceived Dyspnea (Exercise) 3    Duration Continue with 30 min of aerobic exercise without signs/symptoms of physical distress.    Intensity THRR unchanged      Progression   Progression Continue to progress workloads to maintain intensity without signs/symptoms of physical distress.      Resistance Training   Training Prescription Yes    Weight black bands    Reps 10-15    Time 10 Minutes      Oxygen   Oxygen Continuous    Liters 6-8      Recumbant Bike   Level 3    RPM 65    Minutes 15    METs 3.2      NuStep   Level 5    SPM 81    Minutes 15    METs 3.2      Oxygen   Maintain Oxygen Saturation 88% or higher  Nutrition:  Target Goals: Understanding of nutrition guidelines, daily intake of sodium 1500mg , cholesterol 200mg , calories 30% from fat and 7% or less from saturated fats, daily to have 5 or more servings of fruits and vegetables.  Biometrics:  Pre Biometrics - 02/01/23 1420       Pre Biometrics   Grip Strength 21 kg              Nutrition Therapy Plan and Nutrition Goals:   Nutrition Assessments:  MEDIFICTS Score Key: >=70 Need to make dietary changes  40-70 Heart Healthy Diet <= 40 Therapeutic Level Cholesterol Diet   Picture Your Plate Scores: <16 Unhealthy dietary pattern with much room for improvement. 41-50 Dietary pattern unlikely to meet recommendations for good health and room for improvement. 51-60 More healthful dietary pattern, with some room for improvement.  >60 Healthy dietary pattern, although there may be some specific behaviors that could be improved.    Nutrition Goals Re-Evaluation:   Nutrition Goals Discharge (Final Nutrition Goals Re-Evaluation):   Psychosocial: Target Goals: Acknowledge presence or absence of significant depression and/or stress, maximize coping skills,  provide positive support system. Participant is able to verbalize types and ability to use techniques and skills needed for reducing stress and depression.  Initial Review & Psychosocial Screening:  Initial Psych Review & Screening - 02/01/23 1422       Initial Review   Current issues with Current Stress Concerns    Source of Stress Concerns Family      Family Dynamics   Good Support System? Yes      Screening Interventions   Interventions Encouraged to exercise    Expected Outcomes Long Term Goal: Stressors or current issues are controlled or eliminated.;Short Term goal: Identification and review with participant of any Quality of Life or Depression concerns found by scoring the questionnaire.;Long Term goal: The participant improves quality of Life and PHQ9 Scores as seen by post scores and/or verbalization of changes             Quality of Life Scores:  Scores of 19 and below usually indicate a poorer quality of life in these areas.  A difference of  2-3 points is a clinically meaningful difference.  A difference of 2-3 points in the total score of the Quality of Life Index has been associated with significant improvement in overall quality of life, self-image, physical symptoms, and general health in studies assessing change in quality of life.  PHQ-9: Review Flowsheet       02/01/2023  Depression screen PHQ 2/9  Decreased Interest 1  Down, Depressed, Hopeless 1  PHQ - 2 Score 2  Altered sleeping 0  Tired, decreased energy 1  Change in appetite 0  Feeling bad or failure about yourself  0  Trouble concentrating 0  Moving slowly or fidgety/restless 0  Suicidal thoughts 0  PHQ-9 Score 3  Difficult doing work/chores Not difficult at all   Interpretation of Total Score  Total Score Depression Severity:  1-4 = Minimal depression, 5-9 = Mild depression, 10-14 = Moderate depression, 15-19 = Moderately severe depression, 20-27 = Severe depression   Psychosocial Evaluation  and Intervention:  Psychosocial Evaluation - 02/01/23 1425       Psychosocial Evaluation & Interventions   Interventions Encouraged to exercise with the program and follow exercise prescription    Comments Lucia denies any psychosocial concerns at this time. He does state he is worried about his health and has to start helping his wife out more d/t  short term memory loss. He denies any needs at this time. He states he has an "over supportive" family.    Expected Outcomes For Bertram to attend Pulm Rehab free of any psychosocial barriers or concerns    Continue Psychosocial Services  No Follow up required             Psychosocial Re-Evaluation:  Psychosocial Re-Evaluation     Row Name 02/03/23 1225 03/05/23 0850 03/31/23 1034         Psychosocial Re-Evaluation   Current issues with None Identified None Identified None Identified     Comments No changes since orientation. Rangel is scheduled to start the program on 02/11/23. At re-evaluation Josmar denies any psy-soc needs or barriers. Deny easily gets frustrated when staff educate about the need for increasing his oxygen liter flow. Faheem has not come to terms about needing additional O2 support with exertion and switching from his POC to tanks. At re-evaluation Mahonri denies any psy-soc needs or barriers. Sheldon continues to get frustrated when staff educate about the need for increasing his oxygen liter flow. Quintrell has not come to terms about needing additional O2 support with exertion and switching from his POC to tanks. Staff are still encouraging him and educating him about he need for increased oxyen needs with exertion.     Expected Outcomes For Zylon to decrease stress and attend PR free of any psy/soc barriers or concerns For Joquan to attend PR free of any psy/soc barriers or concerns For Eusebio to attend PR free of any psy/soc barriers or concerns     Interventions Encouraged to attend Pulmonary Rehabilitation for the  exercise Encouraged to attend Pulmonary Rehabilitation for the exercise Encouraged to attend Pulmonary Rehabilitation for the exercise     Continue Psychosocial Services  No Follow up required No Follow up required No Follow up required              Psychosocial Discharge (Final Psychosocial Re-Evaluation):  Psychosocial Re-Evaluation - 03/31/23 1034       Psychosocial Re-Evaluation   Current issues with None Identified    Comments At re-evaluation Brack denies any psy-soc needs or barriers. Dale continues to get frustrated when staff educate about the need for increasing his oxygen liter flow. Ostin has not come to terms about needing additional O2 support with exertion and switching from his POC to tanks. Staff are still encouraging him and educating him about he need for increased oxyen needs with exertion.    Expected Outcomes For Torrion to attend PR free of any psy/soc barriers or concerns    Interventions Encouraged to attend Pulmonary Rehabilitation for the exercise    Continue Psychosocial Services  No Follow up required             Education: Education Goals: Education classes will be provided on a weekly basis, covering required topics. Participant will state understanding/return demonstration of topics presented.  Learning Barriers/Preferences:  Learning Barriers/Preferences - 02/01/23 1540       Learning Barriers/Preferences   Learning Barriers Hearing    Learning Preferences Individual Instruction;Group Instruction;Written Material             Education Topics: Know Your Numbers Group instruction that is supported by a PowerPoint presentation. Instructor discusses importance of knowing and understanding resting, exercise, and post-exercise oxygen saturation, heart rate, and blood pressure. Oxygen saturation, heart rate, blood pressure, rating of perceived exertion, and dyspnea are reviewed along with a normal range for these values.  Flowsheet Row  PULMONARY REHAB OTHER RESPIRATORY from 02/18/2023 in [facility] for Heart, Vascular, & Lung Health  Date 02/18/23  Educator EP  Instruction Review Code 1- Verbalizes Understanding       Exercise for the Pulmonary Patient Group instruction that is supported by a PowerPoint presentation. Instructor discusses benefits of exercise, core components of exercise, frequency, duration, and intensity of an exercise routine, importance of utilizing pulse oximetry during exercise, safety while exercising, and options of places to exercise outside of rehab.  Flowsheet Row PULMONARY REHAB OTHER RESPIRATORY from 02/11/2023 in [facility] for Heart, Vascular, & Lung Health  Date 02/11/23  Educator EP  Instruction Review Code 1- Verbalizes Understanding       MET Level  Group instruction provided by PowerPoint, verbal discussion, and written material to support subject matter. Instructor reviews what METs are and how to increase METs.    Pulmonary Medications Verbally interactive group education provided by instructor with focus on inhaled medications and proper administration.   Anatomy and Physiology of the Respiratory System Group instruction provided by PowerPoint, verbal discussion, and written material to support subject matter. Instructor reviews respiratory cycle and anatomical components of the respiratory system and their functions. Instructor also reviews differences in obstructive and restrictive respiratory diseases with examples of each.    Oxygen Safety Group instruction provided by PowerPoint, verbal discussion, and written material to support subject matter. There is an overview of "What is Oxygen" and "Why do we need it".  Instructor also reviews how to create a safe environment for oxygen use, the importance of using oxygen as prescribed, and the risks of noncompliance. There is a brief discussion on traveling with oxygen and resources  the patient may utilize. Flowsheet Row PULMONARY REHAB OTHER RESPIRATORY from 02/25/2023 in [facility] for Heart, Vascular, & Lung Health  Date 02/25/23  Educator RN  Instruction Review Code 1- Verbalizes Understanding       Oxygen Use Group instruction provided by PowerPoint, verbal discussion, and written material to discuss how supplemental oxygen is prescribed and different types of oxygen supply systems. Resources for more information are provided.  Flowsheet Row PULMONARY REHAB OTHER RESPIRATORY from 03/04/2023 in [facility] for Heart, Vascular, & Lung Health  Date 03/04/23  Educator RT  Instruction Review Code 1- Verbalizes Understanding       Breathing Techniques Group instruction that is supported by demonstration and informational handouts. Instructor discusses the benefits of pursed lip and diaphragmatic breathing and detailed demonstration on how to perform both.  Flowsheet Row PULMONARY REHAB OTHER RESPIRATORY from 03/11/2023 in [facility] for Heart, Vascular, & Lung Health  Date 03/11/23  Educator RN  Instruction Review Code 1- Verbalizes Understanding        Risk Factor Reduction Group instruction that is supported by a PowerPoint presentation. Instructor discusses the definition of a risk factor, different risk factors for pulmonary disease, and how the heart and lungs work together. Flowsheet Row PULMONARY REHAB OTHER RESPIRATORY from 04/01/2023 in [facility] for Heart, Vascular, & Lung Health  Date 04/01/23  Educator EP  Instruction Review Code 1- Verbalizes Understanding       Pulmonary Diseases Group instruction provided by PowerPoint, verbal discussion, and written material to support subject matter. Instructor gives an overview of the different type of pulmonary diseases. There is also a discussion on risk factors and symptoms as well as ways to manage the  diseases.  Stress and Energy Conservation Group instruction provided by PowerPoint, verbal discussion, and written material to support subject matter. Instructor gives an overview of stress and the impact it can have on the body. Instructor also reviews ways to reduce stress. There is also a discussion on energy conservation and ways to conserve energy throughout the day. Flowsheet Row PULMONARY REHAB OTHER RESPIRATORY from 03/18/2023 in [facility] for Heart, Vascular, & Lung Health  Date 03/18/23  Educator RN  Instruction Review Code 1- Verbalizes Understanding       Warning Signs and Symptoms Group instruction provided by PowerPoint, verbal discussion, and written material to support subject matter. Instructor reviews warning signs and symptoms of stroke, heart attack, cold and flu. Instructor also reviews ways to prevent the spread of infection. Flowsheet Row PULMONARY REHAB OTHER RESPIRATORY from 03/25/2023 in [facility] for Heart, Vascular, & Lung Health  Date 03/25/23  Educator RN  Instruction Review Code 1- Verbalizes Understanding       Other Education Group or individual verbal, written, or video instructions that support the educational goals of the pulmonary rehab program.    Knowledge Questionnaire Score:  Knowledge Questionnaire Score - 02/01/23 1524       Knowledge Questionnaire Score   Pre Score 16/18             Core Components/Risk Factors/Patient Goals at Admission:  Personal Goals and Risk Factors at Admission - 02/01/23 1330       Core Components/Risk Factors/Patient Goals on Admission   Improve shortness of breath with ADL's Yes    Intervention Provide education, individualized exercise plan and daily activity instruction to help decrease symptoms of SOB with activities of daily living.    Expected Outcomes Short Term: Improve cardiorespiratory fitness to achieve a reduction of symptoms when performing  ADLs             Core Components/Risk Factors/Patient Goals Review:   Goals and Risk Factor Review     Row Name 02/03/23 1228 03/05/23 0852 03/31/23 1035         Core Components/Risk Factors/Patient Goals Review   Personal Goals Review Improve shortness of breath with ADL's;Develop more efficient breathing techniques such as purse lipped breathing and diaphragmatic breathing and practicing self-pacing with activity. Improve shortness of breath with ADL's;Develop more efficient breathing techniques such as purse lipped breathing and diaphragmatic breathing and practicing self-pacing with activity. Improve shortness of breath with ADL's;Develop more efficient breathing techniques such as purse lipped breathing and diaphragmatic breathing and practicing self-pacing with activity.     Review Unable to assess goals. Wilmot is scheduled to start the program on 02/11/23 Core components/risk factors/patient goals monthly re-evaluation are as follows: Goal in progress on improving SOB with ADLs and on developing more efficient breathing techniques such as purse lipped breathing and diaphragmatic breathing; and practicing self-pacing activities. Candice can demonstrate purse lipped breathing but needs prompting by staff when he gets short of breath. He is working on diaphragmatic breathing and practices this at home. He can self-pace himself as needed and takes breaks at appropriate times based on his shortness of breath. Jaycob can report his rate of perceived exertion and dyspnea level accurately. His oxygen demands have increased with exertion. He is now requiring 10L while walking the track. Tramain has completed 7 sessions thus far and has improved his workload and METS. Aris will continue to benefit from participation in PR for nutrition, education, exercise, and lifestyle modification. Core components/risk factors/patient  goals monthly re-evaluation are as follows: Goal in progress on improving SOB  with ADLs and on developing more efficient breathing techniques such as purse lipped breathing and diaphragmatic breathing; and practicing self-pacing activities. Kodey can demonstrate purse lipped breathing and can initiate independently when he gets short of breath. He is working on diaphragmatic breathing and practices this at home. He can self-pace himself as needed and takes breaks at appropriate times based on his shortness of breath. Daxter can report his rate of perceived exertion and dyspnea level accurately. His oxygen demands have increased with exertion. He is now requiring 10L-15L while walking the track. He has not come to terms with the increased oxygen need with exertion. Raidyn will decrease his workload and speed, so he doesn't need as much oxygen for support. Shahrukh is not receptive to staff intervention or education on oxygen needs. Syd will continue to benefit from participation in PR for nutrition, education, exercise, and lifestyle modification.     Expected Outcomes For Gryffin to improve his shortness of breath with ADLs and develop more efficient breathing techniques such as purse lipped breathing and diaphragmatic breathing; and practicing self-pacing activities. For Jaimere to improve his shortness of breath with ADLs and develop more efficient breathing techniques such as purse lipped breathing and diaphragmatic breathing; and practicing self-pacing activities. For Quasim to improve his shortness of breath with ADLs and develop more efficient breathing techniques such as purse lipped breathing and diaphragmatic breathing; and practicing self-pacing activities.              Core Components/Risk Factors/Patient Goals at Discharge (Final Review):   Goals and Risk Factor Review - 03/31/23 1035       Core Components/Risk Factors/Patient Goals Review   Personal Goals Review Improve shortness of breath with ADL's;Develop more efficient breathing techniques such as purse lipped  breathing and diaphragmatic breathing and practicing self-pacing with activity.    Review Core components/risk factors/patient goals monthly re-evaluation are as follows: Goal in progress on improving SOB with ADLs and on developing more efficient breathing techniques such as purse lipped breathing and diaphragmatic breathing; and practicing self-pacing activities. Devaris can demonstrate purse lipped breathing and can initiate independently when he gets short of breath. He is working on diaphragmatic breathing and practices this at home. He can self-pace himself as needed and takes breaks at appropriate times based on his shortness of breath. Antavious can report his rate of perceived exertion and dyspnea level accurately. His oxygen demands have increased with exertion. He is now requiring 10L-15L while walking the track. He has not come to terms with the increased oxygen need with exertion. Domanic will decrease his workload and speed, so he doesn't need as much oxygen for support. Markelle is not receptive to staff intervention or education on oxygen needs. Darron will continue to benefit from participation in PR for nutrition, education, exercise, and lifestyle modification.    Expected Outcomes For Latrel to improve his shortness of breath with ADLs and develop more efficient breathing techniques such as purse lipped breathing and diaphragmatic breathing; and practicing self-pacing activities.             ITP Comments: Pt is making expected progress toward Pulmonary Rehab goals after completing 15 session(s). Recommend continued exercise, life style modification, education, and utilization of breathing techniques to increase stamina and strength, while also decreasing shortness of breath with exertion.  Dr. Mechele Collin is Medical Director for Pulmonary Rehab at [facility].

## 2023-04-08 ENCOUNTER — Encounter (HOSPITAL_COMMUNITY)
Admission: RE | Admit: 2023-04-08 | Discharge: 2023-04-08 | Disposition: A | Discharge status: Home / Self Care | Payer: Medicare Other | Source: Ambulatory Visit | Attending: Pulmonary Disease | Admitting: Pulmonary Disease

## 2023-04-08 DIAGNOSIS — J849 Interstitial pulmonary disease, unspecified: Secondary | ICD-10-CM | POA: Diagnosis not present

## 2023-04-08 NOTE — Progress Notes (Signed)
Daily Session Note  Patient Details  Name: Marc Gilbert MRN: [id number] Date of Birth: 17-Jun-1930 Referring Provider:   Doristine Devoid Pulmonary Rehab Walk Test from 02/01/2023 in [facility] for Heart, Vascular, & Lung Health  Referring Provider Dr. Vassie Loll       Encounter Date: 04/08/2023  Check In:  Session Check In - 04/08/23 1324       Check-In   Supervising physician immediately available to respond to emergencies [facility] MD immediately available    Physician(s) Lorin Picket, NP    Location [facility]    Staff Present Essie Hart, RN, BSN;Lirio Bach Katrinka Blazing, Zella Richer, MS, ACSM-CEP, Exercise Physiologist;Bailey Wallace Cullens, MS, Exercise Physiologist;Randi Idelle Crouch BS, ACSM-CEP, Exercise Physiologist    Virtual Visit No    Medication changes reported     No    Fall or balance concerns reported    Yes    Comments uses walker    Tobacco Cessation No Change    Warm-up and Cool-down Performed as group-led instruction   only   Resistance Training Performed Yes    VAD Patient? No    PAD/SET Patient? No      Pain Assessment   Currently in Pain? No/denies    Multiple Pain Sites No             Capillary Blood Glucose: No results found for this or any previous visit (from the past 24 hours).    Social History   Tobacco Use  Smoking Status Former   Current packs/day: 0.00   Average packs/day: 0.5 packs/day for 25.0 years (12.5 ttl pk-yrs)   Types: Cigarettes   Start date: 05/25/1963   Quit date: 05/24/1988   Years since quitting: 34.8  Smokeless Tobacco Never    Goals Met:  Proper associated with RPD/PD & O2 Sat Independence with exercise equipment Exercise tolerated well No report of concerns or symptoms today Strength training completed today  Goals Unmet:  Not Applicable  Comments: Service time is from 1305 to 1435.    Dr. Mechele Collin is Medical Director for Pulmonary Rehab at [facility].

## 2023-04-08 NOTE — Progress Notes (Signed)
Daily Session Note  Patient Details  Name: Marc Gilbert MRN: [id number] Date of Birth: 1930/12/19 Referring Provider:   Doristine Devoid Pulmonary Rehab Walk Test from 02/01/2023 in [facility], [facility]  Referring Provider Dr. Vassie Loll       Encounter Date: 04/08/2023  Check In:  Session Check In - 04/08/23 1324       Check-In   Supervising physician immediately available to respond to emergencies [facility] MD immediately available    Physician(s) Lorin Picket, NP    Location [facility]    Staff Present Essie Hart, RN, BSN;Markcus Lazenby Katrinka Blazing, Zella Richer, MS, ACSM-CEP, Exercise Physiologist;Bailey Wallace Cullens, MS, Exercise Physiologist;Randi Idelle Crouch BS, ACSM-CEP, Exercise Physiologist    Virtual Visit No    Medication changes reported     No    Fall or balance concerns reported    Yes    Comments uses walker    Tobacco Cessation No Change    Warm-up and Cool-down Performed as group-led instruction   only   Resistance Training Performed Yes    VAD Patient? No    PAD/SET Patient? No      Pain Assessment   Currently in Pain? No/denies    Multiple Pain Sites No             Capillary Blood Glucose: No results found for this or any previous visit (from the past 24 hours).    Social History   Tobacco Use  Smoking Status Former   Current packs/day: 0.00   Average packs/day: 0.5 packs/day for 25.0 years (12.5 ttl pk-yrs)   Types: Cigarettes   Start date: 05/25/1963   Quit date: 05/24/1988   Years since quitting: 34.8  Smokeless Tobacco Never    Goals Met:  Proper associated with RPD/PD & O2 Sat Independence with exercise equipment Exercise tolerated well No report of concerns or symptoms today Strength training completed today  Goals Unmet:  Not Applicable  Comments: Service time is from 1309 to 1441.    Dr. Mechele Collin is Medical Director for Pulmonary Rehab at [facility].

## 2023-04-13 ENCOUNTER — Encounter (HOSPITAL_COMMUNITY)
Admission: RE | Admit: 2023-04-13 | Discharge: 2023-04-13 | Disposition: A | Discharge status: Home / Self Care | Payer: Medicare Other | Source: Ambulatory Visit | Attending: Pulmonary Disease

## 2023-04-13 DIAGNOSIS — J849 Interstitial pulmonary disease, unspecified: Secondary | ICD-10-CM

## 2023-04-13 NOTE — Progress Notes (Signed)
 Daily Session Note  Patient Details  Name: Marc Gilbert MRN: [id number] Date of Birth: 10-19-1930 Referring Provider:   Doristine Devoid Pulmonary Rehab Walk Test from 02/01/2023 in [facility], Vascular, & [facility]  Referring Provider Dr. Vassie Loll       Encounter Date: 04/13/2023  Check In:  Session Check In - 04/13/23 1325       Check-In   Supervising physician immediately available to respond to emergencies [facility] immediately available    Physician(s) Robin Searing, NP    [facility]    Staff Present Essie Hart, RN, BSN;Garlin Batdorf Katrinka Blazing, Zella Richer, MS, ACSM-CEP, Exercise Physiologist;Johnny Hale Bogus, MS, Exercise Physiologist    Virtual Visit No    Medication changes reported     No    Fall or balance concerns reported    Yes    Comments uses walker    Tobacco Cessation No Change    Warm-up and Cool-down Performed as group-led instruction   only   Resistance Training Performed Yes    VAD Patient? No    PAD/SET Patient? No      Pain Assessment   Currently in Pain? No/denies    Multiple Pain Sites No             Capillary Blood Glucose: No results found for this or any previous visit (from the past 24 hours).    Social History   Tobacco Use  Smoking Status Former   Current packs/day: 0.00   Average packs/day: 0.5 packs/day for 25.0 years (12.5 ttl pk-yrs)   Types: Cigarettes   Start date: 05/25/1963   Quit date: 05/24/1988   Years since quitting: 34.9  Smokeless Tobacco Never    Goals Met:  Proper associated with RPD/PD & O2 Sat Independence with exercise equipment Exercise tolerated well No report of concerns or symptoms today Strength training completed today  Goals Unmet:  Not Applicable  Comments: Service time is from 1310 to 1436.    Dr. Mechele Collin is Medical Director for Pulmonary Rehab at [facility].

## 2023-04-15 ENCOUNTER — Encounter (HOSPITAL_COMMUNITY)
Admission: RE | Admit: 2023-04-15 | Discharge: 2023-04-15 | Disposition: A | Discharge status: Home / Self Care | Payer: Medicare Other | Source: Ambulatory Visit | Attending: Pulmonary Disease

## 2023-04-15 DIAGNOSIS — J849 Interstitial pulmonary disease, unspecified: Secondary | ICD-10-CM

## 2023-04-15 NOTE — Progress Notes (Signed)
 Daily Session Note  Patient Details  Name: Marc Gilbert MRN: [id number] Date of Birth: May 15, 1930 Referring Provider:   Doristine Devoid Pulmonary Rehab Walk Test from 02/01/2023 in [facility] for [facility], Vascular, & Lung [facility]  Referring Provider Dr. Vassie Loll       Encounter Date: 04/15/2023  Check In:  Session Check In - 04/15/23 1403       Check-In   Supervising physician immediately available to respond to emergencies [facility] immediately available    Physician(s) Bernadene Person, NP    [facility]    Staff Present Essie Hart, RN, BSN;Casey Katrinka Blazing, Zella Richer, MS, ACSM-CEP, Exercise Physiologist;Randi Idelle Crouch BS, ACSM-CEP, Exercise Physiologist    Virtual Visit No    Medication changes reported     No    Fall or balance concerns reported    Yes    Comments uses walker    Tobacco Cessation No Change    Warm-up and Cool-down Performed as group-led instruction   only   Resistance Training Performed Yes    VAD Patient? No    PAD/SET Patient? No      Pain Assessment   Currently in Pain? No/denies    Multiple Pain Sites No             Capillary Blood Glucose: No results found for this or any previous visit (from the past 24 hours).    Social History   Tobacco Use  Smoking Status Former   Current packs/day: 0.00   Average packs/day: 0.5 packs/day for 25.0 years (12.5 ttl pk-yrs)   Types: Cigarettes   Start date: 05/25/1963   Quit date: 05/24/1988   Years since quitting: 34.9  Smokeless Tobacco Never    Goals Met:  Independence with exercise equipment Exercise tolerated well Queuing for purse lip breathing No report of concerns or symptoms today Strength training completed today  Goals Unmet:  Not Applicable  Comments: Service time is from 1309 to 1459    Dr. Mechele Collin is Medical Director for Pulmonary Rehab at [facility].

## 2023-04-19 DIAGNOSIS — C44622 Squamous cell carcinoma of skin of right upper limb, including shoulder: Secondary | ICD-10-CM | POA: Diagnosis not present

## 2023-04-20 ENCOUNTER — Encounter (HOSPITAL_BASED_OUTPATIENT_CLINIC_OR_DEPARTMENT_OTHER): Payer: Self-pay | Admitting: Pulmonary Disease

## 2023-04-20 ENCOUNTER — Encounter (HOSPITAL_COMMUNITY)
Admission: RE | Admit: 2023-04-20 | Discharge: 2023-04-20 | Disposition: A | Discharge status: Home / Self Care | Payer: Medicare Other | Source: Ambulatory Visit | Attending: Pulmonary Disease | Admitting: Pulmonary Disease

## 2023-04-20 ENCOUNTER — Ambulatory Visit (INDEPENDENT_AMBULATORY_CARE_PROVIDER_SITE_OTHER): Admitting: Pulmonary Disease

## 2023-04-20 VITALS — Wt 196.2 lb

## 2023-04-20 VITALS — BP 124/72 | HR 99 | Ht 69.0 in | Wt 196.1 lb

## 2023-04-20 DIAGNOSIS — Z87891 Personal history of nicotine dependence: Secondary | ICD-10-CM | POA: Diagnosis not present

## 2023-04-20 DIAGNOSIS — J9611 Chronic respiratory failure with hypoxia: Secondary | ICD-10-CM

## 2023-04-20 DIAGNOSIS — Z7189 Other specified counseling: Secondary | ICD-10-CM

## 2023-04-20 DIAGNOSIS — Z9981 Dependence on supplemental oxygen: Secondary | ICD-10-CM

## 2023-04-20 DIAGNOSIS — J849 Interstitial pulmonary disease, unspecified: Secondary | ICD-10-CM | POA: Insufficient documentation

## 2023-04-20 DIAGNOSIS — J84112 Idiopathic pulmonary fibrosis: Secondary | ICD-10-CM

## 2023-04-20 DIAGNOSIS — R7401 Elevation of levels of liver transaminase levels: Secondary | ICD-10-CM | POA: Diagnosis not present

## 2023-04-20 NOTE — Progress Notes (Signed)
 Daily Session Note  Patient Details  Name: Marc Gilbert MRN: [id number] Date of Birth: 08-Sep-1930 Referring Provider:   Doristine Devoid Pulmonary Rehab Walk Test from 02/01/2023 in [facility] for [facility], Vascular, & Lung [facility]  Referring Provider Dr. Vassie Loll       Encounter Date: 04/20/2023  Check In:  Session Check In - 04/20/23 1334       Check-In   Supervising physician immediately available to respond to emergencies [facility] immediately available    Physician(s) Bernadene Person, NP    [facility]    Staff Present Essie Hart, RN, BSN;Kera Deacon Katrinka Blazing, Zella Richer, MS, ACSM-CEP, Exercise Physiologist;Randi Dionisio Paschal, ACSM-CEP, Exercise Physiologist;Johnny Hale Bogus, MS, Exercise Physiologist    Virtual Visit No    Medication changes reported     No    Fall or balance concerns reported    Yes    Comments uses walker    Tobacco Cessation No Change    Warm-up and Cool-down Performed as group-led instruction    Resistance Training Performed Yes    VAD Patient? No    PAD/SET Patient? No      Pain Assessment   Currently in Pain? No/denies    Multiple Pain Sites No             Capillary Blood Glucose: No results found for this or any previous visit (from the past 24 hours).   Exercise Prescription Changes - 04/20/23 1500       Response to Exercise   Blood Pressure (Admit) 118/66    Blood Pressure (Exercise) 142/66    Blood Pressure (Exit) 100/56    Heart Rate (Admit) 86 bpm    Heart Rate (Exercise) 105 bpm    Heart Rate (Exit) 98 bpm    Oxygen Saturation (Admit) 99 %    Oxygen Saturation (Exercise) 88 %    Oxygen Saturation (Exit) 94 %    Rating of Perceived Exertion (Exercise) 15    Perceived Dyspnea (Exercise) 3    Duration Continue with 30 min of aerobic exercise without signs/symptoms of physical distress.    Intensity THRR unchanged      Progression   Progression Continue to progress workloads to maintain intensity  without signs/symptoms of physical distress.      Resistance Training   Training Prescription Yes    Weight black bands    Reps 10-15    Time 10 Minutes      Oxygen   Oxygen Continuous    Liters 6-15      Recumbant Bike   Level 3    Minutes 15    METs 3.1      NuStep   Level 5    SPM 81    Minutes 15    METs 3.1      Oxygen   Maintain Oxygen Saturation 88% or higher             Social History   Tobacco Use  Smoking Status Former   Current packs/day: 0.00   Average packs/day: 0.5 packs/day for 25.0 years (12.5 ttl pk-yrs)   Types: Cigarettes   Start date: 05/25/1963   Quit date: 05/24/1988   Years since quitting: 34.9  Smokeless Tobacco Never    Goals Met:  Proper associated with RPD/PD & O2 Sat Independence with exercise equipment Exercise tolerated well No report of concerns or symptoms today Strength training completed today  Goals Unmet:  Not Applicable  Comments: Service time is  from 1303 to 1436.    Dr. Mechele Collin is Medical Director for Pulmonary Rehab at [facility].

## 2023-04-20 NOTE — Patient Instructions (Signed)
 Decrease Ofev to once daily  X repeat LFTs in 2 weeks   Call back in 2 weeks to report symptoms

## 2023-04-20 NOTE — Progress Notes (Addendum)
 Subjective:    Patient ID: Marc Gilbert, male    DOB: [birth year], 89 y.o.   MRN: [id number]  HPI  89 year old for follow-up of IPF and chronic hypoxic respiratory failure  -2L at rest & upto 6L with exercise He smoked 3/4 pack/day until he quit in 1995, about 30 pack years     PMH : chronic diastolic heart failure,  paroxysmal atrial fibrillation, status post ablation in 08/10/2022 on Eliquis, history of coronary artery disease status post CABG,  chronic kidney see stage IIIa, hypothyroidism,  The patient, with a history of Idiopathic Pulmonary Fibrosis (IPF) and chronic hypoxic respiratory failure on oxygen, presents with significant gastrointestinal side effects from his medication, Ofev (Nintedanib). He has been on the medication for approximately five weeks and reports experiencing severe diarrhea and cramping, which has led to several instances of incontinence. The patient notes that the diarrhea and cramping primarily occur at night, disrupting his sleep. He also mentions that he has had some episodes of nausea but has not vomited. The patient has been in contact with a support group through [facility], which has provided him with dietary advice to help manage the side effects. Despite these challenges, the patient expresses a willingness to continue with the medication, albeit at a reduced dosage, in the hope that his body will adjust and the side effects will lessen. The patient also reports a slight increase in his liver function tests (LFTs), which he attributes to the Ofev.   Significant tests/ events reviewed   02/2023 Initial OV On ambulation oxygen saturation dropped on pulse O2 and required 6 L continuous to maintain about 88%   HRCT chest 01/2023 UIP pattern, progressive compared to 2020, chronic pancreatitis HRCT chest 03/2018 probable UIP pattern, no honeycombing   Echo 10/2022 RVSP 38, enlarged RV   PFTs 02/2023 severe restriction TLC 56%, FVC 75%, DLCO of 43%  Review of  Systems neg for any significant sore throat, dysphagia, itching, sneezing, nasal congestion or excess/ purulent secretions, fever, chills, sweats, unintended wt loss, pleuritic or exertional cp, hempoptysis, orthopnea pnd or change in chronic leg swelling. Also denies presyncope, palpitations, heartburn, abdominal pain, nausea, vomiting, diarrhea or change in bowel or urinary habits, dysuria,hematuria, rash, arthralgias, visual complaints, headache, numbness weakness or ataxia.     Objective:   Physical Exam   Gen. Pleasant, well-nourished, elderly,in no distress ENT - no thrush, no pallor/icterus,no post nasal drip Neck: No JVD, no thyromegaly, no carotid bruits Lungs: no use of accessory muscles, no dullness to percussion, bibasal dry rales no rhonchi  Cardiovascular: Rhythm regular, heart sounds  normal, no murmurs or gallops, no peripheral edema Musculoskeletal: No deformities, no cyanosis or clubbing       Assessment & Plan:    Idiopathic Pulmonary Fibrosis (IPF) Chronic hypoxic respiratory failure secondary to IPF, requiring supplemental oxygen. He is on Ofev (nintedanib) since March 16, 2023, experiencing significant gastrointestinal side effects including diarrhea and cramping. Liver function tests are slightly elevated but not to a concerning level. The response to treatment is measured by lack of disease progression rather than improvement. - Reduce Ofev to once daily to see if diarrhea settles. - Recheck liver function tests in two weeks. - Continue pulmonary rehabilitation exercises at home. - If diarrhea persists, stop Ofev and consider alternative medication -perfenidonewith potential liver side effects but no diarrhea.  Liver Function Test Elevation Mild elevation in liver function tests, possibly related to Ofev and Lipitor. Current levels are not three times the upper limit  of normal, which is the threshold for concern. - Monitor liver function tests in two weeks to  assess any further elevation.  Goals of Care / Advance care planning Discussion about updating MOST form to reflect current wishes regarding intubation and mechanical ventilation. He prefers limited additional interventions without intubation or mechanical ventilation due to poor prognosis with IPF if intubated. He understands that if intubated for a breathing-related problem, there is a high likelihood of not coming off the ventilator. He also discussed the implications of DNR status, understanding that CPR often leads to intubation and mechanical ventilation, which he wishes to avoid. - Update MOST form to reflect limited additional interventions without intubation or mechanical ventilation. - Consider DNR status (no CPR)  given the prognosis with IPF. - Additional time spent independent of other issues on this topic was 20 mins  Follow-up Plan to monitor response to reduced Ofev dosage and liver function tests. He will continue pulmonary rehabilitation and exercise at home. - Schedule follow-up appointment in one month to reassess symptoms and liver function. - Ensure no further refills of Ofev are ordered until reassessment.  Total encounter time x 45 mins

## 2023-04-21 DIAGNOSIS — Z961 Presence of intraocular lens: Secondary | ICD-10-CM | POA: Diagnosis not present

## 2023-04-21 DIAGNOSIS — H18593 Other hereditary corneal dystrophies, bilateral: Secondary | ICD-10-CM | POA: Diagnosis not present

## 2023-04-21 DIAGNOSIS — H35372 Puckering of macula, left eye: Secondary | ICD-10-CM | POA: Diagnosis not present

## 2023-04-22 ENCOUNTER — Encounter (HOSPITAL_COMMUNITY)
Admission: RE | Admit: 2023-04-22 | Discharge: 2023-04-22 | Disposition: A | Discharge status: Home / Self Care | Payer: Medicare Other | Source: Ambulatory Visit | Attending: Pulmonary Disease | Admitting: Pulmonary Disease

## 2023-04-22 ENCOUNTER — Encounter (HOSPITAL_BASED_OUTPATIENT_CLINIC_OR_DEPARTMENT_OTHER): Payer: Self-pay | Admitting: Pulmonary Disease

## 2023-04-22 ENCOUNTER — Ambulatory Visit (HOSPITAL_BASED_OUTPATIENT_CLINIC_OR_DEPARTMENT_OTHER): Payer: Medicare Other | Admitting: Pulmonary Disease

## 2023-04-22 DIAGNOSIS — J849 Interstitial pulmonary disease, unspecified: Secondary | ICD-10-CM

## 2023-04-22 NOTE — Progress Notes (Signed)
 Daily Session Note  Patient Details  Name: Marc Gilbert MRN: [id number] Date of Birth: August 18, 1930 Referring Provider:   Doristine Devoid Pulmonary Rehab Walk Test from 02/01/2023 in [facility] for Heart, Vascular, & Lung Health  Referring Provider Dr. Vassie Loll       Encounter Date: 04/22/2023  Check In:  Session Check In - 04/22/23 1338       Check-In   Supervising physician immediately available to respond to emergencies [facility] MD immediately available    Physician(s) Robin Searing, NP    Location [facility]    Staff Present Essie Hart, RN, BSN;Casey Katrinka Blazing, Zella Richer, MS, ACSM-CEP, Exercise Physiologist;Randi [facility], ACSM-CEP, Exercise Physiologist    Virtual Visit No    Medication changes reported     No    Fall or balance concerns reported    Yes    Comments uses walker    Tobacco Cessation No Change    Warm-up and Cool-down Performed as group-led instruction    Resistance Training Performed Yes    VAD Patient? No    PAD/SET Patient? No      Pain Assessment   Currently in Pain? No/denies    Multiple Pain Sites No             Capillary Blood Glucose: No results found for this or any previous visit (from the past 24 hours).    Social History   Tobacco Use  Smoking Status Former   Current packs/day: 0.00   Average packs/day: 0.5 packs/day for 25.0 years (12.5 ttl pk-yrs)   Types: Cigarettes   Start date: 05/25/1963   Quit date: 05/24/1988   Years since quitting: 34.9  Smokeless Tobacco Never    Goals Met:  Proper associated with RPD/PD & O2 Sat Independence with exercise equipment Exercise tolerated well No report of concerns or symptoms today Strength training completed today  Goals Unmet:  Not Applicable  Comments: Service time is from 1304 to 1456.    Dr. Mechele Collin is Medical Director for Pulmonary Rehab at [facility].

## 2023-04-22 NOTE — Telephone Encounter (Signed)
**Note De-identified  [place] Obfuscation** Please advise 

## 2023-04-27 ENCOUNTER — Encounter: Payer: Self-pay | Admitting: Pulmonary Disease

## 2023-04-27 ENCOUNTER — Encounter (HOSPITAL_COMMUNITY): Admission: RE | Admit: 2023-04-27 | Payer: Medicare Other | Source: Ambulatory Visit

## 2023-04-27 ENCOUNTER — Telehealth (HOSPITAL_COMMUNITY): Payer: Self-pay | Admitting: *Deleted

## 2023-04-27 NOTE — Telephone Encounter (Signed)
 Jayin left voice mail message at 11:12am, will not attend pulmonary rehab today, not feeling well. Not necessary to return his call.

## 2023-04-29 ENCOUNTER — Encounter (HOSPITAL_COMMUNITY)
Admission: RE | Admit: 2023-04-29 | Discharge: 2023-04-29 | Disposition: A | Discharge status: Home / Self Care | Payer: Medicare Other | Source: Ambulatory Visit | Attending: Pulmonary Disease | Admitting: Pulmonary Disease

## 2023-04-29 DIAGNOSIS — J849 Interstitial pulmonary disease, unspecified: Secondary | ICD-10-CM | POA: Diagnosis not present

## 2023-04-29 NOTE — Progress Notes (Signed)
 Daily Session Note  Patient Details  Name: Marc Gilbert MRN: [id number] Date of Birth: [birth year] Referring Provider:   Doristine Devoid Pulmonary Rehab Walk Test from 02/01/2023 in [facility] for Heart, Vascular, & Lung Health  Referring Provider Dr. Vassie Loll       Encounter Date: 04/29/2023  Check In:  Session Check In - 04/29/23 1411       Check-In   Supervising physician immediately available to respond to emergencies [facility] MD immediately available    Physician(s) Edd Fabian, NP    Location [facility]    Staff Present Durel Salts, Zella Richer, MS, ACSM-CEP, Exercise Physiologist;Randi Dionisio Paschal, ACSM-CEP, Exercise Physiologist;Johnny Hale Bogus, MS, Exercise Physiologist    Virtual Visit No    Medication changes reported     No    Fall or balance concerns reported    Yes    Comments uses walker    Tobacco Cessation No Change    Warm-up and Cool-down Performed as group-led instruction    Resistance Training Performed Yes    VAD Patient? No    PAD/SET Patient? No      Pain Assessment   Currently in Pain? No/denies    Multiple Pain Sites No             Capillary Blood Glucose: No results found for this or any previous visit (from the past 24 hours).    Social History   Tobacco Use  Smoking Status Former   Current packs/day: 0.00   Average packs/day: 0.5 packs/day for 25.0 years (12.5 ttl pk-yrs)   Types: Cigarettes   Start date: 05/25/1963   Quit date: 05/24/1988   Years since quitting: 34.9  Smokeless Tobacco Never    Goals Met:  Proper associated with RPD/PD & O2 Sat Independence with exercise equipment Exercise tolerated well No report of concerns or symptoms today Strength training completed today  Goals Unmet:  Not Applicable  Comments: Service time is from 1319 to 1435.    Dr. Mechele Collin is Medical Director for Pulmonary Rehab at [facility].

## 2023-05-03 NOTE — Progress Notes (Signed)
 Discharge Progress Report  Patient Details  Name: Marc Gilbert MRN: [id number] Date of Birth: 01/14/1931 Referring Provider:   Doristine Devoid Pulmonary Rehab Walk Test from 02/01/2023 in [facility]  Referring Provider Dr. Vassie Loll        Number of Visits: 21  Reason for Discharge:  Patient has met program and personal goals.  Smoking History:  Social History   Tobacco Use  Smoking Status Former   Current packs/day: 0.00   Average packs/day: 0.5 packs/day for 25.0 years (12.5 ttl pk-yrs)   Types: Cigarettes   Start date: 05/25/1963   Quit date: 05/24/1988   Years since quitting: 34.9  Smokeless Tobacco Never    Diagnosis:  ILD (interstitial lung disease) (HCC)  ADL [facility]:  Pulmonary Assessment Scores     Row Name 02/01/23 1514 04/22/23 1549 04/29/23 1534     ADL [facility]   ADL Phase Entry Exit Exit   SOB Score total 38 49 --     CAT Score   CAT Score 11 11 --     mMRC Score   mMRC Score 3 -- 2            Initial Exercise Prescription:  Initial Exercise Prescription - 02/01/23 1500       Date of Initial Exercise RX and Referring Provider   Date 02/01/23    Referring Provider Dr. Vassie Loll    Expected Discharge Date 04/29/23      Oxygen   Oxygen Continuous    Liters 6-15    Maintain Oxygen Saturation 88% or higher      Recumbant Bike   Level 1    Minutes 15    METs 1.6      Recumbant Elliptical   Level 1    Minutes 15    METs 1.5      Prescription Details   Frequency (times per week) 2    Duration Progress to 30 minutes of continuous aerobic without signs/symptoms of physical distress      Intensity   THRR 40-80% of Max Heartrate 52-104    Ratings of Perceived Exertion 11-13    Perceived Dyspnea 0-4      Progression   Progression Continue to progress workloads to maintain intensity without signs/symptoms of physical distress.      Resistance Training   Training Prescription Yes     Weight red bands    Reps 10-15             Discharge Exercise Prescription (Final Exercise Prescription Changes):  Exercise Prescription Changes - 04/20/23 1500       Response to Exercise   Blood Pressure (Admit) 118/66    Blood Pressure (Exercise) 142/66    Blood Pressure (Exit) 100/56    Heart Rate (Admit) 86 bpm    Heart Rate (Exercise) 105 bpm    Heart Rate (Exit) 98 bpm    Oxygen Saturation (Admit) 99 %    Oxygen Saturation (Exercise) 88 %    Oxygen Saturation (Exit) 94 %    Rating of Perceived Exertion (Exercise) 15    Perceived Dyspnea (Exercise) 3    Duration Continue with 30 min of aerobic exercise without signs/symptoms of physical distress.    Intensity THRR unchanged      Progression   Progression Continue to progress workloads to maintain intensity without signs/symptoms of physical distress.      Resistance Training   Training Prescription Yes    Weight  black bands    Reps 10-15    Time 10 Minutes      Oxygen   Oxygen Continuous    Liters 6-15      Recumbant Bike   Level 3    Minutes 15    METs 3.1      NuStep   Level 5    SPM 81    Minutes 15    METs 3.1      Oxygen   Maintain Oxygen Saturation 88% or higher             Functional Capacity:  6 Minute Walk     Row Name 02/01/23 1512 04/29/23 1523       6 Minute Walk   Phase Initial Discharge    Distance 600 feet 1180 feet    Distance % Change -- 96.67 %    Distance Feet Change -- 580 ft    Walk Time 6 minutes 6 minutes    # of Rest Breaks 2 0    MPH 1.14 2.23    METS 0.55 1.84    RPE 12 12    Perceived Dyspnea  1 2    VO2 Peak 1.93 6.44    Symptoms No No    Resting HR 84 bpm 78 bpm    Resting BP 106/60 128/70    Resting Oxygen Saturation  98 % 99 %    Exercise Oxygen Saturation  during 6 min walk 86 % 89 %    Max Ex. HR 111 bpm 111 bpm    Max Ex. BP 116/70 144/70    2 Minute Post BP 104/54 122/60      Interval HR   1 Minute HR 85 92    2 Minute HR 91 96    3  Minute HR 111 100    4 Minute HR 99 107    5 Minute HR 104 108    6 Minute HR 104 111    2 Minute Post HR 89 85    Interval Heart Rate? Yes --      Interval Oxygen   Interval Oxygen? Yes --    Baseline Oxygen Saturation % 98 % 99 %    1 Minute Oxygen Saturation % 95 % 98 %    1 Minute Liters of Oxygen 6 L 8 L    2 Minute Oxygen Saturation % 92 % 94 %    2 Minute Liters of Oxygen 6 L 8 L    3 Minute Oxygen Saturation % 86 %  2:30-2:50 increased O2 to 8L 89 %    3 Minute Liters of Oxygen 8 L 8 L    4 Minute Oxygen Saturation % 86 %  3:30-4:00 increased O2 to 10L 90 %    4 Minute Liters of Oxygen 10 L 8 L    5 Minute Oxygen Saturation % 91 % 91 %    5 Minute Liters of Oxygen 15 L 8 L    6 Minute Oxygen Saturation % 93 % 89 %    6 Minute Liters of Oxygen 15 L 8 L    2 Minute Post Oxygen Saturation % 95 % 100 %    2 Minute Post Liters of Oxygen 6 L 8 L             Psychological, QOL, Others - Outcomes: PHQ 2/9:    04/22/2023    3:48 PM 02/01/2023    3:26 PM  Depression screen PHQ 2/9  Decreased Interest 1 1  Down, Depressed, Hopeless 1 1  PHQ - 2 Score 2 2  Altered sleeping 0 0  Tired, decreased energy 1 1  Change in appetite 0 0  Feeling bad or failure about yourself  1 0  Trouble concentrating 0 0  Moving slowly or fidgety/restless 0 0  Suicidal thoughts 0 0  PHQ-9 Score 4 3  Difficult doing work/chores Somewhat difficult Not difficult at all    Quality of Life:   Personal Goals: Goals established at orientation with interventions provided to work toward goal.  Personal Goals and Risk Factors at Admission - 02/01/23 1330       Core Components/Risk Factors/Patient Goals on Admission   Improve shortness of breath with ADL's Yes    Intervention Provide education, individualized exercise plan and daily activity instruction to help decrease symptoms of SOB with activities of daily living.    Expected Outcomes Short Term: Improve cardiorespiratory fitness to  achieve a reduction of symptoms when performing ADLs              Personal Goals Discharge:  Goals and Risk Factor Review     Row Name 02/03/23 1228 03/05/23 0852 03/31/23 1035 05/03/23 0900       Core Components/Risk Factors/Patient Goals Review   Personal Goals Review Improve shortness of breath with ADL's;Develop more efficient breathing techniques such as purse lipped breathing and diaphragmatic breathing and practicing self-pacing with activity. Improve shortness of breath with ADL's;Develop more efficient breathing techniques such as purse lipped breathing and diaphragmatic breathing and practicing self-pacing with activity. Improve shortness of breath with ADL's;Develop more efficient breathing techniques such as purse lipped breathing and diaphragmatic breathing and practicing self-pacing with activity. Improve shortness of breath with ADL's;Develop more efficient breathing techniques such as purse lipped breathing and diaphragmatic breathing and practicing self-pacing with activity.    Review Unable to assess goals. Nori is scheduled to start the program on 02/11/23 Core components/risk factors/patient goals monthly re-evaluation are as follows: Goal in progress on improving SOB with ADLs and on developing more efficient breathing techniques such as purse lipped breathing and diaphragmatic breathing; and practicing self-pacing activities. Naol can demonstrate purse lipped breathing but needs prompting by staff when he gets short of breath. He is working on diaphragmatic breathing and practices this at home. He can self-pace himself as needed and takes breaks at appropriate times based on his shortness of breath. Alezander can report his rate of perceived exertion and dyspnea level accurately. His oxygen demands have increased with exertion. He is now requiring 10L while walking the track. Arieon has completed 7 sessions thus far and has improved his workload and METS. Daanish will continue  to benefit from participation in PR for nutrition, education, exercise, and lifestyle modification. Core components/risk factors/patient goals monthly re-evaluation are as follows: Goal in progress on improving SOB with ADLs and on developing more efficient breathing techniques such as purse lipped breathing and diaphragmatic breathing; and practicing self-pacing activities. Carrol can demonstrate purse lipped breathing and can initiate independently when he gets short of breath. He is working on diaphragmatic breathing and practices this at home. He can self-pace himself as needed and takes breaks at appropriate times based on his shortness of breath. Rush can report his rate of perceived exertion and dyspnea level accurately. His oxygen demands have increased with exertion. He is now requiring 10L-15L while walking the track. He has not come to terms with the increased oxygen need with exertion. Nicandro will decrease  his workload and speed, so he doesn't need as much oxygen for support. Anik is not receptive to staff intervention or education on oxygen needs. Brynn will continue to benefit from participation in PR for nutrition, education, exercise, and lifestyle modification. Carrell graduated from the [employer] on 04/29/23. He did not meet his goal for improving his SOB with ADL's. His SOB scale increased from 38 to 49. He did meet his goal for developing more efficinet breathing techniques such as purse lipped breathing and diaphragmatic breathing; and practicing self-pacing activities.    Expected Outcomes For Ancelmo to improve his shortness of breath with ADLs and develop more efficient breathing techniques such as purse lipped breathing and diaphragmatic breathing; and practicing self-pacing activities. For Malic to improve his shortness of breath with ADLs and develop more efficient breathing techniques such as purse lipped breathing and diaphragmatic breathing; and practicing self-pacing activities.  For Keenan to improve his shortness of breath with ADLs and develop more efficient breathing techniques such as purse lipped breathing and diaphragmatic breathing; and practicing self-pacing activities. Patient will continue to benefit from ongoing nutrition, exercise, and lifestyle modification             Exercise Goals and Review:  Exercise Goals     Row Name 02/01/23 1328             Exercise Goals   Increase Physical Activity Yes       Intervention Provide advice, education, support and counseling about physical activity/exercise needs.;Develop an individualized exercise prescription for aerobic and resistive training based on initial evaluation findings, risk stratification, comorbidities and participant's personal goals.       Expected Outcomes Short Term: Attend rehab on a regular basis to increase amount of physical activity.;Long Term: Exercising regularly at least 3-5 days a week.;Long Term: Add in home exercise to make exercise part of routine and to increase amount of physical activity.       Increase Strength and Stamina Yes       Intervention Provide advice, education, support and counseling about physical activity/exercise needs.;Develop an individualized exercise prescription for aerobic and resistive training based on initial evaluation findings, risk stratification, comorbidities and participant's personal goals.       Expected Outcomes Short Term: Increase workloads from initial exercise prescription for resistance, speed, and METs.;Short Term: Perform resistance training exercises routinely during rehab and add in resistance training at home;Long Term: Improve cardiorespiratory fitness, muscular endurance and strength as measured by increased METs and functional capacity ( )       Able to understand and use rate of perceived exertion (RPE) scale Yes       Intervention Provide education and explanation on how to use RPE scale       Expected Outcomes Short Term: Able  to use RPE daily in rehab to express subjective intensity level;Long Term:  Able to use RPE to guide intensity level when exercising independently       Able to understand and use Dyspnea scale Yes       Intervention Provide education and explanation on how to use Dyspnea scale       Expected Outcomes Short Term: Able to use Dyspnea scale daily in rehab to express subjective sense of shortness of breath during exertion;Long Term: Able to use Dyspnea scale to guide intensity level when exercising independently       Knowledge and understanding of Target Heart Rate Range (THRR) Yes       Intervention Provide education and explanation  of THRR including how the numbers were predicted and where they are located for reference       Expected Outcomes Short Term: Able to state/look up THRR;Short Term: Able to use daily as guideline for intensity in rehab;Long Term: Able to use THRR to govern intensity when exercising independently       Understanding of Exercise Prescription Yes       Intervention Provide education, explanation, and written materials on patient's individual exercise prescription       Expected Outcomes Short Term: Able to explain program exercise prescription;Long Term: Able to explain home exercise prescription to exercise independently                Exercise Goals Re-Evaluation:  Exercise Goals Re-Evaluation     Row Name 02/08/23 1204 02/26/23 1141 03/31/23 1437 04/29/23 1529       Exercise Goal Re-Evaluation   Exercise Goals Review Increase Physical Activity;Increase Strength and Stamina;Able to understand and use rate of perceived exertion (RPE) scale;Able to understand and use Dyspnea scale;Knowledge and understanding of Target Heart Rate Range (THRR);Understanding of Exercise Prescription Increase Physical Activity;Increase Strength and Stamina;Able to understand and use rate of perceived exertion (RPE) scale;Able to understand and use Dyspnea scale;Knowledge and understanding  of Target Heart Rate Range (THRR);Understanding of Exercise Prescription Increase Physical Activity;Increase Strength and Stamina;Able to understand and use rate of perceived exertion (RPE) scale;Able to understand and use Dyspnea scale;Knowledge and understanding of Target Heart Rate Range (THRR);Understanding of Exercise Prescription Increase Physical Activity;Increase Strength and Stamina;Able to understand and use rate of perceived exertion (RPE) scale;Able to understand and use Dyspnea scale;Knowledge and understanding of Target Heart Rate Range (THRR);Understanding of Exercise Prescription    Comments Pt to begin exercise 12/24. Will monitor for progression. Pt has completed 5 exercise sessions with perfect attendance so far. He is walking the track with a walker for 15 min, 2.38 METs. He then is exercising on the recumbent bike, level 2, METs 2.5. He tolerates well but needs increased O2. Performs warm up and cool down independently standing, including squats. Will monitor for progression. He is using blue bands. Pt has completed 13 exercise sessions with perfect attendance. He needed to change from the track due to hip pain. He is now exercising on the recumbent stepper for 15 min, level 5, METs 2.5. He then is exercising on the recumbent bike, level 3, METs 3.0. He tolerates well but needs increased O2. Performs warm up and cool down independently standing, including squats. Will monitor for progression. He is using black bands. Pt has completed 21 exercise sessions, missing 1 session. He has been very successful in the program, increased his 6 min walk test by 97%, 580 ft. His METs averaged 3.4. He exercised with black bands although his grip strength did not increase. His SOB and CAT scores did not change. He plans to exercise at his gym or in his hallways although he is resistant to having enough oxygen to push himself. Graduated today.    Expected Outcomes Through exercise at rehab and home, the  patient will decrease shortness of breath with daily activities and feel confident in carrying out an exercise regimen at home. Through exercise at rehab and home, the patient will decrease shortness of breath with daily activities and feel confident in carrying out an exercise regimen at home. Through exercise at rehab and home, the patient will decrease shortness of breath with daily activities and feel confident in carrying out an exercise regimen at home.  Through exercise at rehab and home, the patient will decrease shortness of breath with daily activities and feel confident in carrying out an exercise regimen at home.             Nutrition & Weight - Outcomes:  Pre Biometrics - 02/01/23 1420       Pre Biometrics   Grip Strength 21 kg             Post Biometrics - 04/29/23 1529        Post  Biometrics   Grip Strength 21 kg             Nutrition:   Nutrition Discharge:   Education Questionnaire Score:  Knowledge Questionnaire Score - 02/01/23 1524       Knowledge Questionnaire Score   Pre Score 16/18             Goals reviewed with patient; copy given to patient.

## 2023-05-07 DIAGNOSIS — I484 Atypical atrial flutter: Secondary | ICD-10-CM | POA: Diagnosis not present

## 2023-05-07 DIAGNOSIS — N1831 Chronic kidney disease, stage 3a: Secondary | ICD-10-CM | POA: Diagnosis not present

## 2023-05-07 DIAGNOSIS — I700 Atherosclerosis of aorta: Secondary | ICD-10-CM | POA: Diagnosis not present

## 2023-05-07 DIAGNOSIS — J449 Chronic obstructive pulmonary disease, unspecified: Secondary | ICD-10-CM | POA: Diagnosis not present

## 2023-05-12 NOTE — Telephone Encounter (Signed)
 Can you please schedule this patient a consult with Dr. Ulice Bold to discuss possible in-office blepharoplasty, preferably on a Friday per patient's most recent message?

## 2023-05-12 NOTE — Telephone Encounter (Signed)
 Please see the most recent message from patient's daughter. I presume she sent it after Chrissie Noa booked that appointment for next Tuesday. Thanks

## 2023-05-14 ENCOUNTER — Encounter: Payer: Self-pay | Admitting: Nurse Practitioner

## 2023-05-14 ENCOUNTER — Ambulatory Visit: Payer: Medicare Other | Attending: Nurse Practitioner | Admitting: Nurse Practitioner

## 2023-05-14 VITALS — BP 100/60 | HR 79 | Ht 72.0 in | Wt 193.0 lb

## 2023-05-14 DIAGNOSIS — Z01810 Encounter for preprocedural cardiovascular examination: Secondary | ICD-10-CM | POA: Insufficient documentation

## 2023-05-14 DIAGNOSIS — I2510 Atherosclerotic heart disease of native coronary artery without angina pectoris: Secondary | ICD-10-CM | POA: Insufficient documentation

## 2023-05-14 DIAGNOSIS — I10 Essential (primary) hypertension: Secondary | ICD-10-CM | POA: Diagnosis not present

## 2023-05-14 DIAGNOSIS — Z86718 Personal history of other venous thrombosis and embolism: Secondary | ICD-10-CM | POA: Insufficient documentation

## 2023-05-14 DIAGNOSIS — I4892 Unspecified atrial flutter: Secondary | ICD-10-CM | POA: Diagnosis not present

## 2023-05-14 DIAGNOSIS — N183 Chronic kidney disease, stage 3 unspecified: Secondary | ICD-10-CM | POA: Insufficient documentation

## 2023-05-14 DIAGNOSIS — E785 Hyperlipidemia, unspecified: Secondary | ICD-10-CM | POA: Insufficient documentation

## 2023-05-14 DIAGNOSIS — I455 Other specified heart block: Secondary | ICD-10-CM | POA: Insufficient documentation

## 2023-05-14 DIAGNOSIS — I480 Paroxysmal atrial fibrillation: Secondary | ICD-10-CM | POA: Diagnosis not present

## 2023-05-14 DIAGNOSIS — I5022 Chronic systolic (congestive) heart failure: Secondary | ICD-10-CM | POA: Insufficient documentation

## 2023-05-14 NOTE — Progress Notes (Signed)
 Office Visit    Patient Name: Marc Gilbert Date of Encounter: 05/14/2023  Primary Care Provider:  Collene Mares, [place] Primary Cardiologist:  Rollene Rotunda, MD  Chief Complaint    89 year old male with a history of CAD s/p CABG x 2 (LIMA-LAD, SVG-OM) in 2018, atrial fibrillation/atrial flutter, HFmrEF, DVT, hypertension, hyperlipidemia, CKD stage III, pulmonary fibrosis, prostate cancer, and GERD who presents for follow-up related to CAD atrial fibrillation, and heart failure.   Past Medical History    Past Medical History:  Diagnosis Date   Arthritis    "right hand; back" (08/30/2015)   Cancer (HCC)    skin  squamous and basal   Chronic lower back pain    CKD (chronic kidney disease), stage III (HCC)    Stage III   COPD (chronic obstructive pulmonary disease) (HCC)    no home O2   Coronary artery disease    Dysplastic colon polyp age 23   carcinoma in situ   GERD (gastroesophageal reflux disease)    occ   H/O hiatal hernia    Hyperlipidemia    Hypertension    Prostate cancer (HCC) 07/2014   active surveillance, Glisson 6   Past Surgical History:  Procedure Laterality Date   A-FLUTTER ABLATION N/A 08/10/2022   Procedure: A-FLUTTER ABLATION;  Surgeon: Mealor, [name], MD;  Location: [facility] INVASIVE CV LAB;  Service: Cardiovascular;  Laterality: N/A;   CARDIAC CATHETERIZATION     CATARACT EXTRACTION W/ INTRAOCULAR LENS IMPLANT Left 08/27/2015   CORONARY ARTERY BYPASS GRAFT N/A 08/14/2016   Procedure: CORONARY ARTERY BYPASS GRAFTING (CABG)x2, using left internal mammary artery and right greater saphenous vein harvested endoscopically;  Surgeon: Alleen Borne, MD;  Location: [facility] OR;  Service: Open Heart Surgery;  Laterality: N/A;   JOINT REPLACEMENT     LEFT HEART CATH AND CORONARY ANGIOGRAPHY N/A 08/11/2016   Procedure: Left Heart Cath and Coronary Angiography;  Surgeon: Lyn Records, MD;  Location: [facility] INVASIVE CV LAB;  Service: Cardiovascular;  Laterality: N/A;    MOHS SURGERY     multiple SCC   PROSTATE BIOPSY  07/18/2014   TEE WITHOUT CARDIOVERSION N/A 08/14/2016   Procedure: TRANSESOPHAGEAL ECHOCARDIOGRAM (TEE);  Surgeon: Alleen Borne, MD;  Location: [facility] OR;  Service: Open Heart Surgery;  Laterality: N/A;   TONSILLECTOMY     TOTAL HIP ARTHROPLASTY  05/26/2011   Procedure: TOTAL HIP ARTHROPLASTY;  Surgeon: Valeria Batman, MD;  Location: [facility] OR;  Service: Orthopedics;  Laterality: Right;   TOTAL HIP ARTHROPLASTY Left 12/25/2021   Procedure: LEFT TOTAL HIP ARTHROPLASTY ANTERIOR APPROACH;  Surgeon: Kathryne Hitch, MD;  Location: [facility] ORS;  Service: Orthopedics;  Laterality: Left;   TOTAL SHOULDER ARTHROPLASTY Right 06/18/2020   Procedure: TOTAL SHOULDER ARTHROPLASTY;  Surgeon: Teryl Lucy, MD;  Location: [facility] ORS;  Service: Orthopedics;  Laterality: Right;   ULTRASOUND GUIDANCE FOR VASCULAR ACCESS  08/11/2016   Procedure: Ultrasound Guidance For Vascular Access;  Surgeon: Lyn Records, MD;  Location: [facility] INVASIVE CV LAB;  Service: Cardiovascular;;    Allergies  No Known Allergies   Labs/Other Studies Reviewed    The following studies were reviewed today:  Cardiac Studies & Procedures   ______________________________________________________________________________________________ CARDIAC CATHETERIZATION  CARDIAC CATHETERIZATION 08/11/2016  Narrative  Critical obstruction in the proximal to mid LAD, 95%, within a heavily calcified region.  Angiographically significant calcified distal left main, 60%.  Greater than 90% small first obtuse marginal  30% mid circumflex  Widely patent, dominant, RCA.  Normal left ventricular systolic function with moderate mid to distal anterior wall hypokinesis. EF 45-50%. Mildly elevated LVEDP consistent with acute diastolic heart failure.  RECOMMENDATIONS:   Given angiographically significant left main, need to discuss treatment options, bypass surgery versus culprit lesion PCI on the LAD  lesion (utilizing rotational or orbital atherectomy). Significant other comorbidities may impact treatment decision.  Surgical consultation has been requested.  Findings Coronary Findings Diagnostic  Dominance: Co-dominant  Left Main  Left Anterior Descending The lesion is eccentric. The lesion is calcified.  First Diagonal Branch Vessel is small in size.  Left Circumflex  First Obtuse Marginal Branch Vessel is small in size.  Intervention  No interventions have been documented.   STRESS TESTS  NM MYOCAR MULTI W/SPECT W 08/31/2015  Narrative CLINICAL DATA:  Chest pain.  EXAM: MYOCARDIAL IMAGING WITH SPECT (REST AND PHARMACOLOGIC-STRESS)  GATED LEFT VENTRICULAR WALL MOTION STUDY  LEFT VENTRICULAR EJECTION FRACTION  TECHNIQUE: Standard myocardial SPECT imaging was performed after resting intravenous injection of 10 mCi Tc-74m tetrofosmin. Subsequently, intravenous infusion of Lexiscan was performed under the supervision of the Cardiology staff. At peak effect of the drug, 30 mCi Tc-53m tetrofosmin was injected intravenously and standard myocardial SPECT imaging was performed. Quantitative gated imaging was also performed to evaluate left ventricular wall motion, and estimate left ventricular ejection fraction.  COMPARISON:  None.  FINDINGS: Perfusion: There is a medium to large size area of mild decreased activity defect involving the inferior apex and inferior wall. No reversibility identified.  Wall Motion: Decreased wall motion involving the mid and distal segments of the inferior wall. There is moderate hypokinesis involving the proximal mid and distal segments of the lateral wall.  Left Ventricular Ejection Fraction: 47 %  End diastolic volume 79 ml  End systolic volume 42 ml  IMPRESSION: 1. No pharmacologically induced. Myocardial reversibility identified.  2. Moderate lateral wall hypokinesis and mild inferior wall hypokinesis.  3. Left  ventricular ejection fraction 47%  4. Non invasive risk stratification*: Low  *2012 Appropriate Use Criteria for Coronary Revascularization Focused Update: J Am Coll Cardiol. 2012;59(9):857-881. http://content.dementiazones.com.aspx?articleid=1201161   Electronically Signed By: Signa Kell M.D. On: 08/31/2015 11:46   ECHOCARDIOGRAM  ECHOCARDIOGRAM COMPLETE 10/31/2022  Narrative ECHOCARDIOGRAM REPORT    Patient Name:   SYLVANUS TELFORD Date of Exam: 10/31/2022 Medical Rec #:  [id number]      Height:       72.0 in Accession #:    [id number]     Weight:       207.2 lb Date of Birth:  [birth year]      BSA:          2.163 m Patient Age:    89 years       BP:           107/57 mmHg Patient Gender: M              HR:           83 bpm. Exam Location:  Inpatient  Procedure: 2D Echo, Cardiac Doppler, Color Doppler and Intracardiac Opacification Agent  Indications:    CHF I50.31  History:        Patient has prior history of Echocardiogram examinations, most recent 07/23/2022. CAD, Prior CABG, CKD; H/O DVT; Risk Factors:Hypertension, Dyslipidemia and Former Smoker.  Sonographer:    Dondra Prader RVT RCS Referring Phys: [id number] VISHAL R PATEL   Sonographer Comments: Technically difficult study due to poor echo windows, suboptimal apical window and suboptimal subcostal window. IMPRESSIONS  1. No left ventricular thrombus is seen (Definity contrast was used). Left ventricular ejection fraction, by estimation, is 45 to 50%. The left ventricle has mildly decreased function. The left ventricle demonstrates global hypokinesis. Left ventricular diastolic parameters are consistent with Grade I diastolic dysfunction (impaired relaxation). 2. Right ventricular systolic function is mildly reduced. The right ventricular size is moderately enlarged. There is mildly elevated pulmonary artery systolic pressure. The estimated right ventricular systolic pressure is 38.0 mmHg. 3. Left atrial size  was mild to moderately dilated. 4. Right atrial size was mildly dilated. 5. The mitral valve is normal in structure. No evidence of mitral valve regurgitation. No evidence of mitral stenosis. 6. The aortic valve is tricuspid. Aortic valve regurgitation is not visualized. No aortic stenosis is present. 7. The inferior vena cava is normal in size with greater than 50% respiratory variability, suggesting right atrial pressure of 3 mmHg.  Comparison(s): No significant change from prior study. Prior images reviewed side by side.  FINDINGS Left Ventricle: No left ventricular thrombus is seen (Definity contrast was used). Left ventricular ejection fraction, by estimation, is 45 to 50%. The left ventricle has mildly decreased function. The left ventricle demonstrates global hypokinesis. The left ventricular internal cavity size was normal in size. There is no left ventricular hypertrophy. Abnormal (paradoxical) septal motion consistent with post-operative status. Left ventricular diastolic parameters are consistent with Grade I diastolic dysfunction (impaired relaxation). Normal left ventricular filling pressure.  Right Ventricle: The right ventricular size is moderately enlarged. No increase in right ventricular wall thickness. Right ventricular systolic function is mildly reduced. There is mildly elevated pulmonary artery systolic pressure. The tricuspid regurgitant velocity is 2.96 m/s, and with an assumed right atrial pressure of 3 mmHg, the estimated right ventricular systolic pressure is 38.0 mmHg.  Left Atrium: Left atrial size was mild to moderately dilated.  Right Atrium: Right atrial size was mildly dilated.  Pericardium: There is no evidence of pericardial effusion.  Mitral Valve: The mitral valve is normal in structure. Mild mitral annular calcification. No evidence of mitral valve regurgitation. No evidence of mitral valve stenosis.  Tricuspid Valve: The tricuspid valve is normal in  structure. Tricuspid valve regurgitation is mild.  Aortic Valve: The aortic valve is tricuspid. Aortic valve regurgitation is not visualized. No aortic stenosis is present. Aortic valve mean gradient measures 2.0 mmHg. Aortic valve peak gradient measures 3.0 mmHg. Aortic valve area, by VTI measures 2.84 cm.  Pulmonic Valve: The pulmonic valve was normal in structure. Pulmonic valve regurgitation is not visualized.  Aorta: The aortic root and ascending aorta are structurally normal, with no evidence of dilitation.  Venous: The inferior vena cava is normal in size with greater than 50% respiratory variability, suggesting right atrial pressure of 3 mmHg.  IAS/Shunts: No atrial level shunt detected by color flow Doppler.   LEFT VENTRICLE PLAX 2D LVIDd:         5.10 cm   Diastology LVIDs:         3.70 cm   LV e' medial:    6.31 cm/s LV PW:         1.20 cm   LV E/e' medial:  8.3 LV IVS:        1.00 cm   LV e' lateral:   7.83 cm/s LVOT diam:     2.20 cm   LV E/e' lateral: 6.7 LV SV:         45 LV SV Index:   21 LVOT Area:  3.80 cm   RIGHT VENTRICLE             IVC RV S prime:     10.70 cm/s  IVC diam: 1.90 cm TAPSE (M-mode): 1.9 cm  LEFT ATRIUM           Index        RIGHT ATRIUM           Index LA diam:      4.30 cm 1.99 cm/m   RA Area:     18.65 cm LA Vol (A4C): 59.7 ml 27.60 ml/m  RA Volume:   51.40 ml  23.76 ml/m AORTIC VALVE                    PULMONIC VALVE AV Area (Vmax):    3.34 cm     PV Vmax:          0.63 m/s AV Area (Vmean):   3.15 cm     PV Peak grad:     1.6 mmHg AV Area (VTI):     2.84 cm     PR End Diast Vel: 7.84 msec AV Vmax:           86.90 cm/s AV Vmean:          57.400 cm/s AV VTI:            0.158 m AV Peak Grad:      3.0 mmHg AV Mean Grad:      2.0 mmHg LVOT Vmax:         76.30 cm/s LVOT Vmean:        47.600 cm/s LVOT VTI:          0.118 m LVOT/AV VTI ratio: 0.75  AORTA Ao Root diam: 3.60 cm Ao Asc diam:  3.60 cm  MITRAL VALVE                TRICUSPID VALVE MV Area (PHT): 3.91 cm    TR Peak grad:   35.0 mmHg MV Decel Time: 194 msec    TR Vmax:        296.00 cm/s MV E velocity: 52.30 cm/s MV A velocity: 81.00 cm/s  SHUNTS MV E/A ratio:  0.65        Systemic VTI:  0.12 m Systemic Diam: 2.20 cm  Thurmon Fair MD Electronically signed by Thurmon Fair MD Signature Date/Time: 10/31/2022/12:30:52 PM    Final   TEE  ECHO TEE 08/14/2016  Interpretation Summary  Septum: No Patent Foramen Ovale present.  Left atrium: Patent foramen ovale not present.  Aortic valve: No stenosis. Trace regurgitation.  Mitral valve: Mild regurgitation.  Left ventricle: LV systolic function is mildly reduced with an EF of 45-50%.  MONITORS  LONG TERM MONITOR-LIVE TELEMETRY (3-14 DAYS) 12/03/2022  Narrative Sinus rhythm HR 51-119 bpm, avg 85 There were few brief atrial runs, the longest lasting 13 beats Otherwise, no arrhythmia detected There was occasional PACs and PVCs No symptoms were reported.       ______________________________________________________________________________________________     Recent Labs: 08/07/2022: TSH 15.000 10/30/2022: B Natriuretic Peptide 468.8 10/31/2022: Hemoglobin 15.9; Magnesium 2.1; Platelets 201 11/10/2022: BUN 24; Creatinine, Ser 1.37; Potassium 5.3; Sodium 139 01/25/2023: ALT 25  Recent Lipid Panel    Component Value Date/Time   CHOL 157 08/10/2016 1302   TRIG 72 08/10/2016 1302   HDL 56 08/10/2016 1302   CHOLHDL 2.8 08/10/2016 1302   VLDL 14 08/10/2016 1302   LDLCALC 87 08/10/2016 1302  History of Present Illness    89 year old male with the above past medical history including CAD s/p CABG x 2 (LIMA-LAD, SVG-OM) in 2018, atrial fibrillation/atrial flutter, HFmrEF, DVT, hypertension, hyperlipidemia, CKD stage III, pulmonary fibrosis, prostate cancer, and GERD.   He underwent CABG x 2 (LIMA-LAD, SVG-OM) in 2018.  Echocardiogram at the time showed EF 55 to 60%, G1 DD, mild RV  dilation, moderate RV systolic dysfunction, no significant valvular abnormalities.  He was diagnosed with atrial fibrillation/atrial flutter in 05/2022.  He noted associated fatigue, shortness of breath, bilateral lower extremity edema. He was started on Eliquis, metoprolol, loaded with amiodarone, and was started on Lasix.  3-day ZIO revealed continuous atrial flutter (100% burden), HR ranging from 30 to 133 bpm, 32 pauses occurred, longest lasting 6.1 seconds.  Echocardiogram in June 2024 showed EF 50 to 55%, low normal LV function, no RWMA, mildly reduced RV systolic function, moderately elevated PASP, no significant valvular abnormalities.  He was referred to EP underwent ablation of atrial flutter on August 10, 2022.  He was seen virtually on 10/30/2022 for preoperative cardiac evaluation and was doing well. Unfortunately, he presented to the ED on 10/30/2022 with sudden onset shortness of breath, fever, cough.  He was treated for bacterial pneumonia, possible COPD exacerbation.  Repeat echocardiogram in 10/2022 showed EF 45 to 50%, mildly decreased LV function, LV global hypokinesis, G1 DD, mildly reduced RV systolic function, no significant valvular abnormalities, no significant change from prior study.  Cardiology was consulted.  Beta-blocker was deferred in the setting of pauses.  He was continued on SGLT2.  Initiation of ARB was deferred in the setting of borderline low BP.  He was discharged on oxygen.  Repeat cardiac monitor in 10/24 showed predominantly sinus rhythm discussed with, occasional PACs and PVCs, no significant arrhythmia.  He saw EP, who determined pacemaker was not indicated.  He established with pulmonology and was diagnosed with pulmonary fibrosis, he has been requiring home oxygen since. He was last seen in the office on 01/21/2023 and was from a cardiac standpoint. He was maintaining sinus rhythm.   He presents today for follow-up accompanied by his wife and daughter.  Since his last visit  he has done well from a cardiac standpoint. He is on 6 L of continuous oxygen, he feels much improved with this.  He denies any chest pain, worsening dyspnea, edema, PND, orthopnea, weight gain.  Denies any palpitations dizziness.  BP has been stable at home. He is planning to undergo blepharoplasty, this will likely be done under local anesthesia. He will likely need cardiac clearance. Overall, he reports feeling well.   Home Medications    Current Outpatient Medications  Medication Sig Dispense Refill   acetaminophen (TYLENOL) 500 MG tablet Take 1,000 mg by mouth every 6 (six) hours as needed for mild pain or moderate pain (for pain).     albuterol (PROVENTIL) (5 MG/ML) 0.5% nebulizer solution Take 0.5 mLs (2.5 mg total) by nebulization every 6 (six) hours as needed for wheezing or shortness of breath. 20 mL 12   apixaban (ELIQUIS) 5 MG TABS tablet Take 1 tablet (5 mg total) by mouth 2 (two) times daily. 60 tablet 6   atorvastatin (LIPITOR) 40 MG tablet Take 1 tablet (40 mg total) by mouth at bedtime. 90 tablet 3   diclofenac Sodium (VOLTAREN) 1 % GEL Apply 1 application  topically See admin instructions. Apply 2-4 grams of gel topically to both knees once a day     docusate  sodium (COLACE) 250 MG capsule Take 250 mg by mouth daily.     empagliflozin (JARDIANCE) 10 MG TABS tablet Take 1 tablet (10 mg total) by mouth daily. 90 tablet 3   gabapentin (NEURONTIN) 300 MG capsule Take 300-600 mg by mouth See admin instructions. Take 300 mg by mouth between 3 AM-4 AM, 600 mg by mouth after breakfast, 600 mg between 3 PM-4 PM, and 300 mg at bedtime     Glycerin-Hypromellose-PEG 400 (DRY EYE RELIEF DROPS) 0.2-0.2-1 % SOLN Place 1 drop into both eyes 3 (three) times daily as needed (for dryness).     guaiFENesin (MUCINEX) 600 MG 12 hr tablet Take by mouth 2 (two) times daily.     INCRUSE ELLIPTA 62.5 MCG/ACT AEPB 1 puff daily.     levothyroxine (SYNTHROID) 50 MCG tablet Take 50 mcg by mouth daily before  breakfast.     Melatonin 10 MG TABS Take 10 mg by mouth at bedtime.     Multiple Vitamins-Minerals (ONE-A-DAY MENS 50+ ADVANTAGE) TABS Take 1 tablet by mouth daily with breakfast.     Omega-3 Fatty Acids (FISH OIL OMEGA-3 PO) Take 1,960 mg by mouth daily.     ondansetron (ZOFRAN) 4 MG tablet Take 1 tablet (4 mg total) by mouth every 8 (eight) hours as needed for nausea or vomiting. 20 tablet 0   oxybutynin (DITROPAN) 5 MG tablet Take 5 mg by mouth at bedtime.     polyethylene glycol (MIRALAX / GLYCOLAX) 17 g packet Take 8.5 g by mouth daily as needed for mild constipation or moderate constipation.     sodium chloride (OCEAN) 0.65 % SOLN nasal spray Place 1 spray into both nostrils as needed for congestion.     sodium chloride HYPERTONIC 3 % nebulizer solution Take by nebulization daily as needed for other. 750 mL 12   Tamsulosin HCl (FLOMAX) 0.4 MG CAPS Take 0.4 mg by mouth at bedtime.     testosterone cypionate (DEPOTESTOSTERONE CYPIONATE) 200 MG/ML injection Inject 66.67 mg into the skin every Wednesday.     carvedilol (COREG) 3.125 MG tablet Take 1 tablet (3.125 mg total) by mouth 2 (two) times daily. 180 tablet 3   furosemide (LASIX) 20 MG tablet Take 1 tablet (20 mg total) by mouth daily for 10 days. (Patient taking differently: Take 20 mg by mouth daily as needed for fluid or edema.) 30 tablet 0   Nintedanib (OFEV) 150 MG CAPS Take 1 capsule (150 mg total) by mouth 2 (two) times daily. 180 capsule 1   SPIRIVA RESPIMAT 2.5 MCG/ACT AERS      No current facility-administered medications for this visit.     Review of Systems    He denies chest pain, palpitations, dyspnea, pnd, orthopnea, n, v, dizziness, syncope, edema, weight gain, or early satiety. All other systems reviewed and are otherwise negative except as noted above.   Physical Exam    VS:  BP 100/60   Pulse 79   Ht 6' (1.829 m)   Wt 193 lb (87.5 kg)   SpO2 91%   BMI 26.18 kg/m   GEN: Well nourished, well developed, in no  acute distress. HEENT: normal. Neck: Supple, no JVD, carotid bruits, or masses. Cardiac: RRR, no murmurs, rubs, or gallops. No clubbing, cyanosis, edema.  Radials/DP/PT 2+ and equal bilaterally.  Respiratory:  Respirations regular and unlabored, clear to auscultation bilaterally. GI: Soft, nontender, nondistended, BS + x 4. MS: no deformity or atrophy. Skin: warm and dry, no rash. Neuro:  Strength and  sensation are intact. Psych: Normal affect.  Accessory Clinical Findings    ECG personally reviewed by me today - EKG Interpretation Date/Time:  Friday May 14 2023 13:33:08 EDT Ventricular Rate:  79 PR Interval:  194 QRS Duration:  136 QT Interval:  396 QTC Calculation: 454 R Axis:   -47  Text Interpretation: Normal sinus rhythm Possible Left atrial enlargement Right bundle branch block Left anterior fascicular block Bifascicular block When compared with ECG of 24-Nov-2022 12:58, Premature ventricular complexes are no longer Present Left anterior fascicular block is now Present Confirmed by Bernadene Person ([id number]) on 05/14/2023 1:34:26 PM  - no acute changes.   Lab Results  Component Value Date   WBC 4.6 10/31/2022   HGB 15.9 10/31/2022   HCT 50.1 10/31/2022   MCV 96.2 10/31/2022   PLT 201 10/31/2022   Lab Results  Component Value Date   CREATININE 1.37 (H) 11/10/2022   BUN 24 11/10/2022   NA 139 11/10/2022   K 5.3 (H) 11/10/2022   CL 100 11/10/2022   CO2 24 11/10/2022   Lab Results  Component Value Date   ALT 25 01/25/2023   AST 27 01/25/2023   ALKPHOS 84 01/25/2023   BILITOT 0.8 01/25/2023   Lab Results  Component Value Date   CHOL 157 08/10/2016   HDL 56 08/10/2016   LDLCALC 87 08/10/2016   TRIG 72 08/10/2016   CHOLHDL 2.8 08/10/2016    Lab Results  Component Value Date   HGBA1C 5.7 (H) 08/10/2016    Assessment & Plan    1. HFmrEF/pulmonary hypertension: Most recent echo showed EF 45 to 50%, mildly decreased LV function, LV global hypokinesis, G1 DD,  mildly reduced RV systolic function, no significant valvular abnormalities, no significant change from prior study. Euvolemic and well compensated on exam.  He has not required any Lasix.  Continue carvedilol, Jardiance.   2. Persistent atrial fibrillation/atrial flutter/history of pauses: Prior 3-day ZIO in the setting of atrial fibrillation/atrial flutter revealed continuous atrial flutter (100% burden), HR ranging from 30 to 133 bpm, 32 pauses occurred, longest lasting 6.1 seconds. S/p ablation in 07/2022. Repeat cardiac monitor in 10/24 showed predominantly sinus rhythm discussed with, occasional PACs and PVCs, no significant arrhythmia.  He was evaluated by EP who felt that pacemaker was not indicated.  He is tolerating low-dose carvedilol. Continue carvedilol, Eliquis.    3. CAD: S/p CABG x 2 (LIMA-LAD, SVG-OM) in 2018. Stable with no anginal symptoms. No indication for ischemic evaluation.  No aspirin in the setting of Eliquis.  Continue Lipitor.   4. History of DVT: Resolved, on Eliquis as above.   5. Hypertension: BP well controlled, runs low, asymptomatic. Continue current antihypertensive regimen.    6. Hyperlipidemia: LDL was 62 in 12/2022.  Continue Lipitor.   7. CKD stage III: Creatinine was stable at 1.37 in 12/2022.    8. Pulmonary fibrosis: Recently diagnosed. On 6L home O2, denies worsening dyspnea. Follows with pulmonology.   9. Preoperative cardiac exam: According to the Revised Cardiac Risk Index (RCRI), his Perioperative Risk of Major Cardiac Event is (%): 0.4. His Functional Capacity in METs is: 5.07 according to the [employer] Activity Status Index (DASI). Therefore, based on ACC/AHA guidelines, patient would be at acceptable risk for the planned procedure without further cardiovascular testing.  If he decides to pursue surgery, he will need recommendations from pharmacy regarding Eliquis hold.  Should he require general anesthesia, he would need input from pulmonology given  pulmonary fibrosis, oxygen requirements.  10. Disposition: Follow-up in 6 months with Dr. Antoine Poche, sooner if needed.      Joylene Grapes, NP 05/14/2023, 2:03 PM

## 2023-05-14 NOTE — Patient Instructions (Signed)
 Medication Instructions:  Your physician recommends that you continue on your current medications as directed. Please refer to the Current Medication list given to you today.  *If you need a refill on your cardiac medications before your next appointment, please call your pharmacy*  Lab Work: NONE ordered at this time of appointment    Testing/Procedures: NONE ordered at this time of appointment    Follow-Up: At [facility], you and your health needs are our priority.  As part of our continuing mission to provide you with exceptional heart care, our providers are all part of one team.  This team includes your primary Cardiologist (physician) and Advanced Practice Providers or APPs (Physician Assistants and Nurse Practitioners) who all work together to provide you with the care you need, when you need it.  Your next appointment:   6 month(s)  Provider:   Rollene Rotunda, MD  or Bernadene Person, NP        We recommend signing up for the patient portal called "MyChart".  Sign up information is provided on this After Visit Summary.  MyChart is used to connect with patients for Virtual Visits (Telemedicine).  Patients are able to view lab/test results, encounter notes, upcoming appointments, etc.  Non-urgent messages can be sent to your provider as well.   To learn more about what you can do with MyChart, go to [email or web address].   Other Instructions       1st Floor: - Lobby - Registration  - Pharmacy  - Lab - Cafe  2nd Floor: - PV Lab - Diagnostic Testing (echo, CT, nuclear med)  3rd Floor: - Vacant  4th Floor: - TCTS (cardiothoracic surgery) - AFib Clinic - Structural Heart Clinic - Vascular Surgery  - Vascular Ultrasound  5th Floor: - [facility] (general and EP) - Clinical Pharmacy for coumadin, hypertension, lipid, weight-loss medications, and med management appointments    Valet parking services will be available as well.

## 2023-05-16 ENCOUNTER — Encounter: Payer: Self-pay | Admitting: Nurse Practitioner

## 2023-05-17 DIAGNOSIS — I5033 Acute on chronic diastolic (congestive) heart failure: Secondary | ICD-10-CM | POA: Diagnosis not present

## 2023-05-17 DIAGNOSIS — N1831 Chronic kidney disease, stage 3a: Secondary | ICD-10-CM | POA: Diagnosis not present

## 2023-05-17 DIAGNOSIS — I700 Atherosclerosis of aorta: Secondary | ICD-10-CM | POA: Diagnosis not present

## 2023-05-17 DIAGNOSIS — I484 Atypical atrial flutter: Secondary | ICD-10-CM | POA: Diagnosis not present

## 2023-05-17 DIAGNOSIS — J449 Chronic obstructive pulmonary disease, unspecified: Secondary | ICD-10-CM | POA: Diagnosis not present

## 2023-05-18 ENCOUNTER — Ambulatory Visit (INDEPENDENT_AMBULATORY_CARE_PROVIDER_SITE_OTHER): Admitting: Plastic Surgery

## 2023-05-18 ENCOUNTER — Encounter (HOSPITAL_BASED_OUTPATIENT_CLINIC_OR_DEPARTMENT_OTHER): Payer: Self-pay | Admitting: Pulmonary Disease

## 2023-05-18 DIAGNOSIS — H02831 Dermatochalasis of right upper eyelid: Secondary | ICD-10-CM

## 2023-05-18 DIAGNOSIS — H02834 Dermatochalasis of left upper eyelid: Secondary | ICD-10-CM

## 2023-05-18 NOTE — Progress Notes (Signed)
   Subjective:    Patient ID: Marc Gilbert, male    DOB: 11-Nov-1930, 89 y.o.   MRN: [id number]  The patient is a 89 year old male joining me by phone for further discussion about upper lid blepharoplasty.  He had some pulmonary issues so the surgery that had been scheduled was postponed.  It is preferable that he not be put to sleep.  We should be fine to do this under sedation.  Will get the clearances from his pulmonary and PCP doc and I will send a note over to [facility] for scheduling.    Review of Systems  Constitutional: Negative.   Eyes: Negative.        Objective:   Physical Exam      Assessment & Plan:     ICD-10-CM   1. Dermatochalasis of both upper eyelids  H02.831    H02.834        I connected with  Marc Gilbert on 05/18/23 by phone with patient and his daughter and verified that I am speaking with the correct person using two identifiers.  The patient was at home and I was at the office.  We spent 5 minutes in discussion.  Plan will be for surgery under sedation.bilateral upper lid blepharoplasty.   I discussed the limitations of evaluation and management by telemedicine. The patient expressed understanding and agreed to proceed.

## 2023-05-18 NOTE — Telephone Encounter (Signed)
 Will you give clearance when form comes in or does pt need appt? Pt LOV with you was 04/20/2023

## 2023-05-19 ENCOUNTER — Telehealth: Payer: Self-pay

## 2023-05-19 DIAGNOSIS — Z23 Encounter for immunization: Secondary | ICD-10-CM | POA: Diagnosis not present

## 2023-05-19 NOTE — Telephone Encounter (Signed)
 Faxed the Request for Surgical Clearance form to patient's PCP and Pulmonologist with confirmed receipt.  PCP: Evangeline Dakin, PA-C: [phone] Pulm: Dr. Vassie Loll: [phone]

## 2023-05-20 DIAGNOSIS — J849 Interstitial pulmonary disease, unspecified: Secondary | ICD-10-CM | POA: Diagnosis not present

## 2023-05-24 ENCOUNTER — Encounter (HOSPITAL_BASED_OUTPATIENT_CLINIC_OR_DEPARTMENT_OTHER): Payer: Self-pay | Admitting: Pulmonary Disease

## 2023-05-24 DIAGNOSIS — J84112 Idiopathic pulmonary fibrosis: Secondary | ICD-10-CM

## 2023-05-24 DIAGNOSIS — Z5181 Encounter for therapeutic drug level monitoring: Secondary | ICD-10-CM

## 2023-05-24 NOTE — Telephone Encounter (Signed)
**Note De-identified  [place] Obfuscation** Please advise 

## 2023-05-25 NOTE — Addendum Note (Signed)
 Addended by: Peggye Form on: 05/25/2023 07:14 AM   Modules accepted: Orders

## 2023-06-05 DIAGNOSIS — I700 Atherosclerosis of aorta: Secondary | ICD-10-CM | POA: Diagnosis not present

## 2023-06-05 DIAGNOSIS — I484 Atypical atrial flutter: Secondary | ICD-10-CM | POA: Diagnosis not present

## 2023-06-05 DIAGNOSIS — N1831 Chronic kidney disease, stage 3a: Secondary | ICD-10-CM | POA: Diagnosis not present

## 2023-06-05 DIAGNOSIS — J449 Chronic obstructive pulmonary disease, unspecified: Secondary | ICD-10-CM | POA: Diagnosis not present

## 2023-06-09 DIAGNOSIS — M79674 Pain in right toe(s): Secondary | ICD-10-CM | POA: Diagnosis not present

## 2023-06-09 DIAGNOSIS — B351 Tinea unguium: Secondary | ICD-10-CM | POA: Diagnosis not present

## 2023-06-09 DIAGNOSIS — M79675 Pain in left toe(s): Secondary | ICD-10-CM | POA: Diagnosis not present

## 2023-06-09 NOTE — Progress Notes (Unsigned)
 Patient ID: Marc Gilbert, male    DOB: January 10, 1931, 89 y.o.   MRN: [id number]  No chief complaint on file.     ICD-10-CM   1. Dermatochalasis of both upper eyelids  H02.831    H02.834        History of Present Illness: Marc Gilbert is a 89 y.o.  male  with a history of dermatochalasis of the upper eyelids.  He presents for preoperative evaluation for upcoming procedure, blepharoplasty , scheduled for 06/28/23 with Dr. Orin Birk.  The patient {HAS HAS WUJ:[id number]} had problems with anesthesia. ***  Summary of Previous Visit: Patient was seen for initial consult by Dr. Orin Birk on 04/29/2022.  At this visit, patient noticed that he had an increase in difficulty in seeing his full field over the past several years.  Patient was noted to have dramatic excess skin of the upper eyelids with fat herniation medially.  Patient was found to be a good candidate for bilateral upper lid blepharoplasty.  Patient's surgery was then canceled due to pulmonary issues.  Patient then had a telephone visit with Dr. Orin Birk again on 05/18/2023.  At this visit, it was noted that it would be preferable to not put patient to sleep, and that he would be fine for this procedure under sedation.  Plan was to move forward for surgery under sedation for bilateral upper lid blepharoplasty.  Job: ***  PMH Significant for: Hypertension, CAD, acute on chronic diastolic heart failure, atrial flutter, DVT, atrial fibrillation, COPD, interstitial lung disease, hypothyroidism, CKD, hyperlipidemia, history of CABG  Per chart review, patient reached out to his pulmonologist, Dr. Villa Greaser, on 05/18/2023.  Dr. Alois Arnt responded "he can be cleared for low risk surgery requiring local anesthesia.  However any form of sedation would increase the risk and this should be discussed with the surgeon".  Per chart review, patient was seen by cardiology on 05/14/2023.  His perioperative risk of major cardiac event is 0.4%.  Based on ACC/AHA  guidelines, patient would be at an acceptable risk for the planned procedure without any further cardiovascular testing.  It was noted that if patient decided to pursue surgery, he would need recommendations from pharmacy regarding his Eliquis  hold.  Past Medical History: Allergies: No Known Allergies  Current Medications:  Current Outpatient Medications:    acetaminophen  (TYLENOL ) 500 MG tablet, Take 1,000 mg by mouth every 6 (six) hours as needed for mild pain or moderate pain (for pain)., Disp: , Rfl:    albuterol  (PROVENTIL ) (5 MG/ML) 0.5% nebulizer solution, Take 0.5 mLs (2.5 mg total) by nebulization every 6 (six) hours as needed for wheezing or shortness of breath., Disp: 20 mL, Rfl: 12   apixaban  (ELIQUIS ) 5 MG TABS tablet, Take 1 tablet (5 mg total) by mouth 2 (two) times daily., Disp: 60 tablet, Rfl: 6   atorvastatin  (LIPITOR ) 40 MG tablet, Take 1 tablet (40 mg total) by mouth at bedtime., Disp: 90 tablet, Rfl: 3   carvedilol  (COREG ) 3.125 MG tablet, Take 1 tablet (3.125 mg total) by mouth 2 (two) times daily., Disp: 180 tablet, Rfl: 3   diclofenac Sodium (VOLTAREN) 1 % GEL, Apply 1 application  topically See admin instructions. Apply 2-4 grams of gel topically to both knees once a day, Disp: , Rfl:    docusate sodium  (COLACE) 250 MG capsule, Take 250 mg by mouth daily., Disp: , Rfl:    empagliflozin  (JARDIANCE ) 10 MG TABS tablet, Take 1 tablet (10 mg total) by mouth daily., Disp:  90 tablet, Rfl: 3   gabapentin  (NEURONTIN ) 300 MG capsule, Take 300-600 mg by mouth See admin instructions. Take 300 mg by mouth between 3 AM-4 AM, 600 mg by mouth after breakfast, 600 mg between 3 PM-4 PM, and 300 mg at bedtime, Disp: , Rfl:    Glycerin-Hypromellose-PEG 400 (DRY EYE RELIEF DROPS) 0.2-0.2-1 % SOLN, Place 1 drop into both eyes 3 (three) times daily as needed (for dryness)., Disp: , Rfl:    guaiFENesin (MUCINEX) 600 MG 12 hr tablet, Take by mouth 2 (two) times daily., Disp: , Rfl:    INCRUSE  ELLIPTA 62.5 MCG/ACT AEPB, 1 puff daily., Disp: , Rfl:    levothyroxine  (SYNTHROID ) 50 MCG tablet, Take 50 mcg by mouth daily before breakfast., Disp: , Rfl:    Melatonin 10 MG TABS, Take 10 mg by mouth at bedtime., Disp: , Rfl:    Multiple Vitamins-Minerals (ONE-A-DAY MENS 50+ ADVANTAGE) TABS, Take 1 tablet by mouth daily with breakfast., Disp: , Rfl:    Omega-3 Fatty Acids (FISH OIL  OMEGA-3 PO), Take 1,960 mg by mouth daily., Disp: , Rfl:    ondansetron  (ZOFRAN ) 4 MG tablet, Take 1 tablet (4 mg total) by mouth every 8 (eight) hours as needed for nausea or vomiting., Disp: 20 tablet, Rfl: 0   oxybutynin  (DITROPAN ) 5 MG tablet, Take 5 mg by mouth at bedtime., Disp: , Rfl:    polyethylene glycol (MIRALAX  / GLYCOLAX ) 17 g packet, Take 8.5 g by mouth daily as needed for mild constipation or moderate constipation., Disp: , Rfl:    sodium chloride  (OCEAN) 0.65 % SOLN nasal spray, Place 1 spray into both nostrils as needed for congestion., Disp: , Rfl:    sodium chloride  HYPERTONIC 3 % nebulizer solution, Take by nebulization daily as needed for other., Disp: 750 mL, Rfl: 12   Tamsulosin  HCl (FLOMAX ) 0.4 MG CAPS, Take 0.4 mg by mouth at bedtime., Disp: , Rfl:    testosterone  cypionate (DEPOTESTOSTERONE CYPIONATE) 200 MG/ML injection, Inject 66.67 mg into the skin every Wednesday., Disp: , Rfl:   Past Medical Problems: Past Medical History:  Diagnosis Date   Arthritis    "right hand; back" (08/30/2015)   Cancer (HCC)    skin  squamous and basal   Chronic lower back pain    CKD (chronic kidney disease), stage III (HCC)    Stage III   COPD (chronic obstructive pulmonary disease) (HCC)    no home O2   Coronary artery disease    Dysplastic colon polyp age 22   carcinoma in situ   GERD (gastroesophageal reflux disease)    occ   H/O hiatal hernia    Hyperlipidemia    Hypertension    Prostate cancer (HCC) 07/2014   active surveillance, Glisson 6    Past Surgical History: Past Surgical History:   Procedure Laterality Date   A-FLUTTER ABLATION N/A 08/10/2022   Procedure: A-FLUTTER ABLATION;  Surgeon: Mealor, Donnamae Gaba, MD;  Location: [facility] INVASIVE CV LAB;  Service: Cardiovascular;  Laterality: N/A;   CARDIAC CATHETERIZATION     CATARACT EXTRACTION W/ INTRAOCULAR LENS IMPLANT Left 08/27/2015   CORONARY ARTERY BYPASS GRAFT N/A 08/14/2016   Procedure: CORONARY ARTERY BYPASS GRAFTING (CABG)x2, using left internal mammary artery and right greater saphenous vein harvested endoscopically;  Surgeon: Bartley Lightning, MD;  Location: [facility] OR;  Service: Open Heart Surgery;  Laterality: N/A;   JOINT REPLACEMENT     LEFT HEART CATH AND CORONARY ANGIOGRAPHY N/A 08/11/2016   Procedure: Left Heart Cath and  Coronary Angiography;  Surgeon: Arty Binning, MD;  Location[facility] INVASIVE CV LAB;  Service: Cardiovascular;  Laterality: N/A;   MOHS SURGERY     multiple SCC   PROSTATE BIOPSY  07/18/2014   TEE WITHOUT CARDIOVERSION N/A 08/14/2016   Procedure: TRANSESOPHAGEAL ECHOCARDIOGRAM (TEE);  Surgeon: Bartley Lightning, MD;  Location: [facility] OR;  Service: Open Heart Surgery;  Laterality: N/A;   TONSILLECTOMY     TOTAL HIP ARTHROPLASTY  05/26/2011   Procedure: TOTAL HIP ARTHROPLASTY;  Surgeon: Shirlee Dotter, MD;  Location: [facility] OR;  Service: Orthopedics;  Laterality: Right;   TOTAL HIP ARTHROPLASTY Left 12/25/2021   Procedure: LEFT TOTAL HIP ARTHROPLASTY ANTERIOR APPROACH;  Surgeon: Arnie Lao, MD;  Location: [facility] ORS;  Service: Orthopedics;  Laterality: Left;   TOTAL SHOULDER ARTHROPLASTY Right 06/18/2020   Procedure: TOTAL SHOULDER ARTHROPLASTY;  Surgeon: Osa Blase, MD;  Location: [facility] ORS;  Service: Orthopedics;  Laterality: Right;   ULTRASOUND GUIDANCE FOR VASCULAR ACCESS  08/11/2016   Procedure: Ultrasound Guidance For Vascular Access;  Surgeon: Arty Binning, MD;  Location: [facility] INVASIVE CV LAB;  Service: Cardiovascular;;    Social History: Social History   Socioeconomic History   Marital  status: Married    Spouse name: Climmie Damme   Number of children: 2   Years of education: Not on file   Highest education level: Master's degree (e.g., MA, MS, MEng, MEd, MSW, MBA)  Occupational History   Occupation: retired 1996  Tobacco Use   Smoking status: Former    Current packs/day: 0.00    Average packs/day: 0.5 packs/day for 25.0 years (12.5 ttl pk-yrs)    Types: Cigarettes    Start date: 05/25/1963    Quit date: 05/24/1988    Years since quitting: 35.0   Smokeless tobacco: Never  Vaping Use   Vaping status: Never Used  Substance and Sexual Activity   Alcohol use: Yes    Alcohol/week: 10.0 standard drinks of alcohol    Types: 5 Glasses of wine, 5 Cans of beer per week    Comment: daily   Drug use: No   Sexual activity: Not Currently  Other Topics Concern   Not on file  Social History Narrative   Lives at [place], married for 68 years, 2 daughters   Social Drivers of Corporate investment banker Strain: Not on file  Food Insecurity: No Food Insecurity (10/31/2022)   Hunger Vital Sign    Worried About Running Out of Food in the Last Year: Never true    Ran Out of Food in the Last Year: Never true  Transportation Needs: No Transportation Needs (10/31/2022)   PRAPARE - Administrator, Civil Service (Medical): No    Lack of Transportation (Non-Medical): No  Physical Activity: Not on file  Stress: Not on file  Social Connections: Not on file  Intimate Partner Violence: Not At Risk (10/31/2022)   Humiliation, Afraid, Rape, and Kick questionnaire    Fear of Current or Ex-Partner: No    Emotionally Abused: No    Physically Abused: No    Sexually Abused: No    Family History: Family History  Problem Relation Age of Onset   Breast cancer Mother 106       mastectomy   Diabetes Father    Breast cancer Daughter        2005. Lumpectomy with chemo and radiation. Under the care of Dr. Charolett Copes     Pancreatic cancer Neg Hx    Colon cancer  Neg Hx    Prostate cancer  Neg Hx     Review of Systems: ROS  Physical Exam: Vital Signs There were no vitals taken for this visit.  Physical Exam *** Constitutional:      General: Not in acute distress.    Appearance: Normal appearance. Not ill-appearing.  HENT:     Head: Normocephalic and atraumatic.  Eyes:     Pupils: Pupils are equal, round Neck:     Musculoskeletal: Normal range of motion.  Cardiovascular:     Rate and Rhythm: Normal rate    Pulses: Normal pulses.  Pulmonary:     Effort: Pulmonary effort is normal. No respiratory distress.  Abdominal:     General: Abdomen is flat. There is no distension.  Musculoskeletal: Normal range of motion.  Skin:    General: Skin is warm and dry.     Findings: No erythema or rash.  Neurological:     General: No focal deficit present.     Mental Status: Alert and oriented to person, place, and time. Mental status is at baseline.     Motor: No weakness.  Psychiatric:        Mood and Affect: Mood normal.        Behavior: Behavior normal.    Assessment/Plan: The patient is scheduled for *** with Dr. {MVHQI:[id number]::"[id number]","[id number]"}.  Risks, benefits, and alternatives of procedure discussed, questions answered and consent obtained.    Smoking Status: ***; Counseling Given? *** Last Mammogram: ***; Results: ***  Caprini Score: ***; Risk Factors include: ***, BMI *** 25, and length of planned surgery. Recommendation for mechanical *** prophylaxis. Encourage early ambulation.   Pictures obtained: @consult ***  Post-op Rx sent to pharmacy: {Blank:19197::"Oxycodone , Zofran , Keflex ","Oxycodone , Zofran "}  Patient was provided with the *** General Surgical Risk consent document and Pain Medication Agreement prior to their appointment.  They had adequate time to read through the risk consent documents and Pain Medication Agreement. We also discussed them in person together during this preop appointment. All of their questions were answered to their  satisfaction.  Recommended calling if they have any further questions.  Risk consent form and Pain Medication Agreement to be scanned into patient's chart.  ***   Electronically signed by: Harden Leyden, PA-C 06/09/2023 10:33 AM

## 2023-06-10 ENCOUNTER — Ambulatory Visit (INDEPENDENT_AMBULATORY_CARE_PROVIDER_SITE_OTHER): Admitting: Student

## 2023-06-10 DIAGNOSIS — H02834 Dermatochalasis of left upper eyelid: Secondary | ICD-10-CM

## 2023-06-10 DIAGNOSIS — H02831 Dermatochalasis of right upper eyelid: Secondary | ICD-10-CM

## 2023-06-14 ENCOUNTER — Encounter: Payer: Self-pay | Admitting: Physical Medicine and Rehabilitation

## 2023-06-16 ENCOUNTER — Encounter: Payer: Self-pay | Admitting: Physical Medicine and Rehabilitation

## 2023-06-16 ENCOUNTER — Ambulatory Visit (INDEPENDENT_AMBULATORY_CARE_PROVIDER_SITE_OTHER): Admitting: Physical Medicine and Rehabilitation

## 2023-06-16 DIAGNOSIS — M47816 Spondylosis without myelopathy or radiculopathy, lumbar region: Secondary | ICD-10-CM | POA: Diagnosis not present

## 2023-06-16 DIAGNOSIS — I5033 Acute on chronic diastolic (congestive) heart failure: Secondary | ICD-10-CM | POA: Diagnosis not present

## 2023-06-16 DIAGNOSIS — N1831 Chronic kidney disease, stage 3a: Secondary | ICD-10-CM | POA: Diagnosis not present

## 2023-06-16 DIAGNOSIS — G8929 Other chronic pain: Secondary | ICD-10-CM

## 2023-06-16 DIAGNOSIS — I484 Atypical atrial flutter: Secondary | ICD-10-CM | POA: Diagnosis not present

## 2023-06-16 DIAGNOSIS — R269 Unspecified abnormalities of gait and mobility: Secondary | ICD-10-CM | POA: Diagnosis not present

## 2023-06-16 DIAGNOSIS — M545 Low back pain, unspecified: Secondary | ICD-10-CM

## 2023-06-16 DIAGNOSIS — J449 Chronic obstructive pulmonary disease, unspecified: Secondary | ICD-10-CM | POA: Diagnosis not present

## 2023-06-16 NOTE — Progress Notes (Signed)
 Marc Gilbert - 89 y.o. male MRN [id number]  Date of birth: 1930/11/22  Office Visit Note: Visit Date: 06/16/2023 PCP: Annabell Key, [place]  E, PA Referred by: Annabell Key, [place]  E, PA  Subjective: Chief Complaint  Patient presents with   Lower Back - Pain   HPI: Marc Gilbert is a 89 y.o. male who comes in today for evaluation of chronic, worsening and severe bilateral lower back pain. Pain ongoing for several years. His pain worsens with walking and activity. He describes pain as sore and aching sensation, currently rates as 8 out of 10. Some relief of pain with rest and use of medications. He is currently taking Tramadol  for severe discomfort. He does take Gabapentin  four times a day. Lumbar MRI imaging from 2023 shows progression of severe L5-S1 facet arthrosis with worsened anterolisthesis and unchanged moderate bilateral foraminal stenosis. No high grade spinal canal stenosis noted. Patient underwent bilateral L5-S1 facet joint injections in our office on 09/17/2022, he reports greater than 80% relief of pain for approximately 6 months. He reports increased functional ability post injection.  He currently resides at [facility], he ambulates with cane, currently using nasal cannula due to issues with COPD/pulmonary fibrosis. No recent trauma or falls.       Review of Systems  Musculoskeletal:  Positive for back pain.  Neurological:  Negative for tingling, sensory change, focal weakness and weakness.  All other systems reviewed and are negative.  Otherwise per HPI.  Assessment & Plan: Visit Diagnoses:    ICD-10-CM   1. Chronic bilateral low back pain without sciatica  M54.50    G89.29     2. Spondylosis without myelopathy or radiculopathy, lumbar region  M47.816     3. Facet arthropathy, lumbar  M47.816     4. Gait abnormality  R26.9        Plan: Findings:  Chronic worsening and severe axial back pain. No radicular symptoms down the legs. Patient continues to have severe pain  despite good conservative therapies such as home exercise regimen, rest and use of medications. Patients clinical presentation and exam are consistent with facet mediated pain. There is advanced facet arthropathy at the level of L5-S1. We discussed treatment plan in detail today. Next step is to perform diagnostic and hopefully therapeutic bilateral L5-S1 facet joint injections under fluoroscopic guidance. If good relief of pain with these injection we can repeat procedure infrequently as needed. I instructed patient to continue with Tramadol  for severe discomfort. He has no questions at this time. We will see him back for injections. No red flag symptoms noted upon exam today.     Meds & Orders: No orders of the defined types were placed in this encounter.  No orders of the defined types were placed in this encounter.   Follow-up: Return for Bilateral L5-S1 facet joint injections.   Procedures: No procedures performed      Clinical History: MRI LUMBAR SPINE WITHOUT CONTRAST   TECHNIQUE: Multiplanar, multisequence MR imaging of the lumbar spine was performed. No intravenous contrast was administered.   COMPARISON:  10/29/2005   FINDINGS: Segmentation:  Standard   Alignment: Grade 1 retrolisthesis at L2-3 and L3-4. Grade 1 anterolisthesis at L5-S1. S shaped scoliosis.   Vertebrae:  No fracture, evidence of discitis, or bone lesion.   Conus medullaris and cauda equina: Conus extends to the L1 level. Conus and cauda equina appear normal.   Paraspinal and other soft tissues: Fatty atrophy of the paraspinous muscles   Disc levels:  T12-L1: Small disc bulge without stenosis.   L1-L2: Progression of disc bulge with endplate spurring. No spinal canal stenosis. Mild left neural foraminal stenosis.   L2-L3: Small disc bulge, unchanged. No spinal canal stenosis. Mild left neural foraminal stenosis.   L3-L4: Progression of disc degeneration with small bulge and endplate spurring.  Unchanged left lateral recess narrowing without central spinal canal stenosis. Progression of moderate left neural foraminal stenosis.   L4-L5: Small right asymmetric disc bulge, slightly worsened. No spinal canal stenosis. Unchanged mild bilateral neural foraminal stenosis.   L5-S1: Progression of severe facet arthrosis with increased anterolisthesis. Regression of right subarticular disc herniation. Narrowing of the lateral recesses without central spinal canal stenosis. Unchanged moderate bilateral neural foraminal stenosis.   Visualized sacrum: Normal.   IMPRESSION: 1. Progression of severe L5-S1 facet arthrosis with worsened anterolisthesis and unchanged moderate bilateral foraminal stenosis. 2. Progression of degenerative disc disease at L3-4 with worsened moderate left foraminal stenosis.     Electronically Signed   By: Juanetta Nordmann M.D.   On: 06/16/2021 21:10   He reports that he quit smoking about 35 years ago. His smoking use included cigarettes. He started smoking about 60 years ago. He has a 12.5 pack-year smoking history. He has never used smokeless tobacco. No results for input(s): "HGBA1C", "LABURIC" in the last 8760 hours.  Objective:  VS:  HT:    WT:   BMI:     BP:   HR: bpm  TEMP: ( )  RESP:  Physical Exam Vitals and nursing note reviewed.  HENT:     Head: Normocephalic and atraumatic.     Right Ear: External ear normal.     Left Ear: External ear normal.     Nose: Nose normal.     Mouth/Throat:     Mouth: Mucous membranes are moist.  Eyes:     Extraocular Movements: Extraocular movements intact.  Cardiovascular:     Rate and Rhythm: Normal rate.     Pulses: Normal pulses.  Pulmonary:     Effort: Pulmonary effort is normal.  Abdominal:     General: Abdomen is flat. There is no distension.  Musculoskeletal:        General: Tenderness present.     Cervical back: Normal range of motion.     Comments: Patient is slow to rise from seated position  to standing. Pain noted with facet loading and lumbar extension. 5/5 strength noted with bilateral hip flexion, knee flexion/extension, ankle dorsiflexion/plantarflexion and EHL. No clonus noted bilaterally. No pain upon palpation of greater trochanters. No pain with internal/external rotation of bilateral hips. Sensation intact bilaterally. Negative slump test bilaterally. Ambulates with cane, gait slow and unsteady.   Skin:    General: Skin is warm and dry.     Capillary Refill: Capillary refill takes less than 2 seconds.  Neurological:     Mental Status: He is alert and oriented to person, place, and time.     Gait: Gait abnormal.  Psychiatric:        Mood and Affect: Mood normal.        Behavior: Behavior normal.     Ortho Exam  Imaging: No results found.  Past Medical/Family/Surgical/Social History: Medications & Allergies reviewed per EMR, new medications updated. Patient Active Problem List   Diagnosis Date Noted   ILD (interstitial lung disease) (HCC) 01/25/2023   Chronic respiratory failure with hypoxia (HCC) 01/25/2023   Acute on chronic heart failure with preserved ejection fraction (HFpEF) (HCC) 10/30/2022  Hypothyroidism 10/30/2022   Paroxysmal atrial fibrillation (HCC) 10/30/2022   New onset atrial flutter (HCC) 06/15/2022   Acute on chronic diastolic heart failure (HCC) 06/15/2022   Hypercoagulable state due to typical atrial flutter (HCC) 06/15/2022   Dermatochalasis 04/30/2022   Status post total replacement of left hip 12/25/2021   Trochanteric bursitis, left hip 08/20/2021   Unilateral primary osteoarthritis, left hip 05/27/2021   Low back pain 05/27/2021   DVT (deep venous thrombosis) (HCC) 07/02/2020   S/P reverse total shoulder arthroplasty, right 06/18/2020   Osteoarthritis of right shoulder 05/16/2020   Malignant neoplasm of prostate (HCC) 04/25/2019   Dyslipidemia 01/03/2019   Pain in right shoulder 09/20/2018   Unilateral primary osteoarthritis,  right knee 09/20/2018   Bilateral hearing loss 12/07/2016   Bilateral impacted cerumen 12/07/2016   Presence of aortocoronary bypass graft 09/17/2016   Coronary artery disease of native heart with stable angina pectoris (HCC) 09/17/2016   Unstable angina (HCC) 09/17/2016   Hx of CABG 08/14/2016   Chest pain 08/10/2016   Chronic kidney disease, stage 3a (HCC) 08/30/2015   Hyperlipidemia 08/30/2015   Osteoarthritis of hip 05/01/2011   Essential hypertension 05/01/2011   COPD (chronic obstructive pulmonary disease) (HCC) 05/01/2011   Cancer of skin, face 05/01/2011   Past Medical History:  Diagnosis Date   Arthritis    "right hand; back" (08/30/2015)   Cancer (HCC)    skin  squamous and basal   Chronic lower back pain    CKD (chronic kidney disease), stage III (HCC)    Stage III   COPD (chronic obstructive pulmonary disease) (HCC)    no home O2   Coronary artery disease    Dysplastic colon polyp age 73   carcinoma in situ   GERD (gastroesophageal reflux disease)    occ   H/O hiatal hernia    Hyperlipidemia    Hypertension    Prostate cancer (HCC) 07/2014   active surveillance, Glisson 6   Family History  Problem Relation Age of Onset   Breast cancer Mother 25       mastectomy   Diabetes Father    Breast cancer Daughter        2005. Lumpectomy with chemo and radiation. Under the care of Dr. Charolett Copes     Pancreatic cancer Neg Hx    Colon cancer Neg Hx    Prostate cancer Neg Hx    Past Surgical History:  Procedure Laterality Date   A-FLUTTER ABLATION N/A 08/10/2022   Procedure: A-FLUTTER ABLATION;  Surgeon: Mealor, Donnamae Gaba, MD;  Location: [facility] IN[facility]ASIVE CV [facility];  Service: Cardiovascular;  Laterality: N/A;   CARDIAC CATHETERIZATION     CATARACT EXTRACTION W/ INTRAOCULAR LENS IMPLANT Left 08/27/2015   CORONARY ARTERY BYPASS GRAFT N/A 08/14/2016   Procedure: CORONARY ARTERY BYPASS GRAFTING (CABG)x2, using left internal mammary artery and right greater saphenous vein  harvested endoscopically;  Surgeon: Bartley Lightning, MD;  Location: [facility] OR;  Service: Open Heart Surgery;  Laterality: N/A;   JOINT REPLACEMENT     LEFT HEART CATH AND CORONARY ANGIOGRAPHY N/A 08/11/2016   Procedure: Left Heart Cath and Coronary Angiography;  Surgeon: Arty Binning, MD;  Location: [facility] INVASIVE CV LAB;  Service: Cardiovascular;  Laterality: N/A;   MOHS SURGERY     multiple SCC   PROSTATE BIOPSY  07/18/2014   TEE WITHOUT CARDIOVERSION N/A 08/14/2016   Procedure: TRANSESOPHAGEAL ECHOCARDIOGRAM (TEE);  Surgeon: Bartley Lightning, MD;  Location: [facility] OR;  Service: Open Heart Surgery;  Laterality:  N/A;   TONSILLECTOMY     TOTAL HIP ARTHROPLASTY  05/26/2011   Procedure: TOTAL HIP ARTHROPLASTY;  Surgeon: Shirlee Dotter, MD;  Location: [facility] OR;  Service: Orthopedics;  Laterality: Right;   TOTAL HIP ARTHROPLASTY Left 12/25/2021   Procedure: LEFT TOTAL HIP ARTHROPLASTY ANTERIOR APPROACH;  Surgeon: Arnie Lao, MD;  Location: [facility] ORS;  Service: Orthopedics;  Laterality: Left;   TOTAL SHOULDER ARTHROPLASTY Right 06/18/2020   Procedure: TOTAL SHOULDER ARTHROPLASTY;  Surgeon: Osa Blase, MD;  Location: [facility] ORS;  Service: Orthopedics;  Laterality: Right;   ULTRASOUND GUIDANCE FOR VASCULAR ACCESS  08/11/2016   Procedure: Ultrasound Guidance For Vascular Access;  Surgeon: Arty Binning, MD;  Location: [facility] INVASIVE CV LAB;  Service: Cardiovascular;;   Social History   Occupational History   Occupation: retired 1996  Tobacco Use   Smoking status: Former    Current packs/day: 0.00    Average packs/day: 0.5 packs/day for 25.0 years (12.5 ttl pk-yrs)    Types: Cigarettes    Start date: 05/25/1963    Quit date: 05/24/1988    Years since quitting: 35.0   Smokeless tobacco: Never  Vaping Use   Vaping status: Never Used  Substance and Sexual Activity   Alcohol use: Yes    Alcohol/week: 10.0 standard drinks of alcohol    Types: 5 Glasses of wine, 5 Cans of beer per week    Comment:  daily   Drug use: No   Sexual activity: Not Currently

## 2023-06-16 NOTE — Progress Notes (Signed)
 Pain Scale   Average Pain 7 Patient advises his pain is less today due to Tramadol . Patient advising he want another injection for lower back pain        +Driver, -BT, -Dye Allergies.

## 2023-06-17 ENCOUNTER — Telehealth: Payer: Self-pay

## 2023-06-17 ENCOUNTER — Ambulatory Visit: Admitting: Student

## 2023-06-17 ENCOUNTER — Encounter: Payer: Self-pay | Admitting: Student

## 2023-06-17 VITALS — BP 114/66 | HR 76 | Ht 72.0 in | Wt 188.0 lb

## 2023-06-17 DIAGNOSIS — H02834 Dermatochalasis of left upper eyelid: Secondary | ICD-10-CM

## 2023-06-17 DIAGNOSIS — H02831 Dermatochalasis of right upper eyelid: Secondary | ICD-10-CM

## 2023-06-17 MED ORDER — CEPHALEXIN 500 MG PO CAPS: 500.0000 mg | ORAL_CAPSULE | Freq: Four times a day (QID) | ORAL | 0 refills | Status: AC

## 2023-06-17 NOTE — Progress Notes (Addendum)
 Patient ID: Marc Gilbert, male    DOB: 1930-08-18, 89 y.o.   MRN: [id number]  Chief Complaint  Patient presents with   Pre-op  Exam      ICD-10-CM   1. Dermatochalasis of both upper eyelids  H02.831    H02.834        History of Present Illness: Marc Gilbert is a 89 y.o.  male  with a history of dermatochalasis of the upper eyelids.  He presents for preoperative evaluation for upcoming procedure, blepharoplasty, scheduled for 06/28/2023 with Dr. Orin Birk.  The patient has not had problems with anesthesia.  Patient reports he is current taking Eliquis .  Summary of Previous Visit: Patient had been seen back in the fall 2024 for and was scheduled for bilateral upper eyelid blepharoplasty in October 2024.  His surgery was subsequently canceled due to hospitalization due to respiratory issues.  Patient was then had an appointment with Dr. Orin Birk on 05/18/2023.  It was noted that it would be preferable that patient not be put to sleep and that procedure should be fine under sedation.  Job: retired  PMH Significant for: PMH Significant for: Hypertension, CAD, acute on chronic diastolic heart failure, atrial flutter, DVT, atrial fibrillation, COPD, interstitial lung disease, hypothyroidism, CKD, hyperlipidemia, history of CABG.  Patient states he is not a smoker.  Patient reports he is taking testosterone .  He does report he had a blood clot in his right lower extremity in 2002 after his shoulder replacement.  He denies any family history of blood clots.  He denies any personal or family history of clotting diseases.  He denies any recent surgeries, traumas or infections.  Patient states that he had a double bypass in the past but denies history of MI.  He denies history of stroke.  He denies any history ofCrohn's disease or ulcerative colitis.  He does report he has history of COPD.  He states that he usually is on 2 L of oxygen .  He states that when he is active though that he has to go  up to 6 L of oxygen .He denies any recent fevers, chills or changes in his health.  He denies any new cardiac or pulmonary issues.  He denies any new chest pain, shortness of breath.  Per chart review, patient reached out to his pulmonologist, Dr. Villa Greaser, on 05/18/2023.  Dr. Alois Arnt responded "he can be cleared for low risk surgery requiring local anesthesia.  However any form of sedation would increase the risk and this should be discussed with the surgeon".  Per chart review, patient was seen by cardiology on 05/14/2023.  His perioperative risk of major cardiac event is 0.4%.  Based on ACC/AHA guidelines, patient would be at an acceptable risk for the planned procedure without any further cardiovascular testing.  It was noted that if patient decided to pursue surgery, he would need recommendations from pharmacy regarding his Eliquis  hold.  Patient states that he is currently taking tramadol  for back pain he has been experiencing.  Past Medical History: Allergies: No Known Allergies  Current Medications:  Current Outpatient Medications:    acetaminophen  (TYLENOL ) 500 MG tablet, Take 1,000 mg by mouth every 6 (six) hours as needed for mild pain or moderate pain (for pain)., Disp: , Rfl:    albuterol  (PROVENTIL ) (5 MG/ML) 0.5% nebulizer solution, Take 0.5 mLs (2.5 mg total) by nebulization every 6 (six) hours as needed for wheezing or shortness of breath., Disp: 20 mL, Rfl: 12   apixaban  (ELIQUIS )  5 MG TABS tablet, Take 1 tablet (5 mg total) by mouth 2 (two) times daily., Disp: 60 tablet, Rfl: 6   atorvastatin  (LIPITOR ) 40 MG tablet, Take 1 tablet (40 mg total) by mouth at bedtime., Disp: 90 tablet, Rfl: 3   cephALEXin  (KEFLEX ) 500 MG capsule, Take 1 capsule (500 mg total) by mouth 4 (four) times daily for 3 days., Disp: 12 capsule, Rfl: 0   diclofenac Sodium (VOLTAREN) 1 % GEL, Apply 1 application  topically See admin instructions. Apply 2-4 grams of gel topically to both knees once a day, Disp: , Rfl:     docusate sodium  (COLACE) 250 MG capsule, Take 250 mg by mouth daily., Disp: , Rfl:    empagliflozin  (JARDIANCE ) 10 MG TABS tablet, Take 1 tablet (10 mg total) by mouth daily., Disp: 90 tablet, Rfl: 3   gabapentin  (NEURONTIN ) 300 MG capsule, Take 300-600 mg by mouth See admin instructions. Take 300 mg by mouth between 3 AM-4 AM, 600 mg by mouth after breakfast, 600 mg between 3 PM-4 PM, and 300 mg at bedtime, Disp: , Rfl:    Glycerin-Hypromellose-PEG 400 (DRY EYE RELIEF DROPS) 0.2-0.2-1 % SOLN, Place 1 drop into both eyes 3 (three) times daily as needed (for dryness)., Disp: , Rfl:    guaiFENesin (MUCINEX) 600 MG 12 hr tablet, Take by mouth 2 (two) times daily., Disp: , Rfl:    INCRUSE ELLIPTA 62.5 MCG/ACT AEPB, 1 puff daily., Disp: , Rfl:    levothyroxine  (SYNTHROID ) 50 MCG tablet, Take 50 mcg by mouth daily before breakfast., Disp: , Rfl:    Melatonin 10 MG TABS, Take 10 mg by mouth at bedtime., Disp: , Rfl:    Multiple Vitamins-Minerals (ONE-A-DAY MENS 50+ ADVANTAGE) TABS, Take 1 tablet by mouth daily with breakfast., Disp: , Rfl:    Omega-3 Fatty Acids (FISH OIL  OMEGA-3 PO), Take 1,960 mg by mouth daily., Disp: , Rfl:    ondansetron  (ZOFRAN ) 4 MG tablet, Take 1 tablet (4 mg total) by mouth every 8 (eight) hours as needed for nausea or vomiting., Disp: 20 tablet, Rfl: 0   oxybutynin  (DITROPAN ) 5 MG tablet, Take 5 mg by mouth at bedtime., Disp: , Rfl:    polyethylene glycol (MIRALAX  / GLYCOLAX ) 17 g packet, Take 8.5 g by mouth daily as needed for mild constipation or moderate constipation., Disp: , Rfl:    sodium chloride  (OCEAN) 0.65 % SOLN nasal spray, Place 1 spray into both nostrils as needed for congestion., Disp: , Rfl:    sodium chloride  HYPERTONIC 3 % nebulizer solution, Take by nebulization daily as needed for other., Disp: 750 mL, Rfl: 12   Tamsulosin  HCl (FLOMAX ) 0.4 MG CAPS, Take 0.4 mg by mouth at bedtime., Disp: , Rfl:    testosterone  cypionate (DEPOTESTOSTERONE CYPIONATE) 200  MG/ML injection, Inject 66.67 mg into the skin every Wednesday., Disp: , Rfl:    traMADol  (ULTRAM ) 50 MG tablet, Take 50 mg by mouth 3 (three) times daily as needed., Disp: , Rfl:    carvedilol  (COREG ) 3.125 MG tablet, Take 1 tablet (3.125 mg total) by mouth 2 (two) times daily., Disp: 180 tablet, Rfl: 3  Past Medical Problems: Past Medical History:  Diagnosis Date   Arthritis    "right hand; back" (08/30/2015)   Cancer (HCC)    skin  squamous and basal   Chronic lower back pain    CKD (chronic kidney disease), stage III (HCC)    Stage III   COPD (chronic obstructive pulmonary disease) (HCC)  no home O2   Coronary artery disease    Dysplastic colon polyp age 61   carcinoma in situ   GERD (gastroesophageal reflux disease)    occ   H/O hiatal hernia    Hyperlipidemia    Hypertension    Prostate cancer (HCC) 07/2014   active surveillance, Glisson 6    Past Surgical History: Past Surgical History:  Procedure Laterality Date   A-FLUTTER ABLATION N/A 08/10/2022   Procedure: A-FLUTTER ABLATION;  Surgeon: Mealor, Donnamae Gaba, MD;  Location: [facility] INVASIVE CV LAB;  Service: Cardiovascular;  Laterality: N/A;   CARDIAC CATHETERIZATION     CATARACT EXTRACTION W/ INTRAOCULAR LENS IMPLANT Left 08/27/2015   CORONARY ARTERY BYPASS GRAFT N/A 08/14/2016   Procedure: CORONARY ARTERY BYPASS GRAFTING (CABG)x2, using left internal mammary artery and right greater saphenous vein harvested endoscopically;  Surgeon: Bartley Lightning, MD;  Location: [facility] OR;  Service: Open Heart Surgery;  Laterality: N/A;   JOINT REPLACEMENT     LEFT HEART CATH AND CORONARY ANGIOGRAPHY N/A 08/11/2016   Procedure: Left Heart Cath and Coronary Angiography;  Surgeon: Arty Binning, MD;  Location: [facility] INVASIVE CV LAB;  Service: Cardiovascular;  Laterality: N/A;   MOHS SURGERY     multiple SCC   PROSTATE BIOPSY  07/18/2014   TEE WITHOUT CARDIOVERSION N/A 08/14/2016   Procedure: TRANSESOPHAGEAL ECHOCARDIOGRAM (TEE);  Surgeon:  Bartley Lightning, MD;  Location: [facility];  Service: Open Heart Surgery;  Laterality: N/A;   TONSILLECTOMY     TOTAL HIP ARTHROPLASTY  05/26/2011   Procedure: TOTAL HIP ARTHROPLASTY;  Surgeon: Shirlee Dotter, MD;  Location: [facility] OR;  Service: Orthopedics;  Laterality: Right;   TOTAL HIP ARTHROPLASTY Left 12/25/2021   Procedure: LEFT TOTAL HIP ARTHROPLASTY ANTERIOR APPROACH;  Surgeon: Arnie Lao, MD;  Location: [facility] ORS;  Service: Orthopedics;  Laterality: Left;   TOTAL SHOULDER ARTHROPLASTY Right 06/18/2020   Procedure: TOTAL SHOULDER ARTHROPLASTY;  Surgeon: Osa Blase, MD;  Location: [facility] ORS;  Service: Orthopedics;  Laterality: Right;   ULTRASOUND GUIDANCE FOR VASCULAR ACCESS  08/11/2016   Procedure: Ultrasound Guidance For Vascular Access;  Surgeon: Arty Binning, MD;  Location: [facility] INVASIVE CV LAB;  Service: Cardiovascular;;    Social History: Social History   Socioeconomic History   Marital status: Married    Spouse name: Climmie Damme   Number of children: 2   Years of education: Not on file   Highest education level: Master's degree (e.g., MA, MS, MEng, MEd, MSW, MBA)  Occupational History   Occupation: retired 1996  Tobacco Use   Smoking status: Former    Current packs/day: 0.00    Average packs/day: 0.5 packs/day for 25.0 years (12.5 ttl pk-yrs)    Types: Cigarettes    Start date: 05/25/1963    Quit date: 05/24/1988    Years since quitting: 35.0   Smokeless tobacco: Never  Vaping Use   Vaping status: Never Used  Substance and Sexual Activity   Alcohol use: Yes    Alcohol/week: 10.0 standard drinks of alcohol    Types: 5 Glasses of wine, 5 Cans of beer per week    Comment: daily   Drug use: No   Sexual activity: Not Currently  Other Topics Concern   Not on file  Social History Narrative   Lives at [place], married for 68 years, 2 daughters   Social Drivers of Corporate investment banker Strain: Not on file  Food Insecurity: No Food Insecurity (10/31/2022)    Hunger Vital  Sign    Worried About Programme researcher, broadcasting/film/video in the Last Year: Never true    Ran Out of Food in the Last Year: Never true  Transportation Needs: No Transportation Needs (10/31/2022)   PRAPARE - Administrator, Civil Service (Medical): No    Lack of Transportation (Non-Medical): No  Physical Activity: Not on file  Stress: Not on file  Social Connections: Not on file  Intimate Partner Violence: Not At Risk (10/31/2022)   Humiliation, Afraid, Rape, and Kick questionnaire    Fear of Current or Ex-Partner: No    Emotionally Abused: No    Physically Abused: No    Sexually Abused: No    Family History: Family History  Problem Relation Age of Onset   Breast cancer Mother 38       mastectomy   Diabetes Father    Breast cancer Daughter        2005. Lumpectomy with chemo and radiation. Under the care of Dr. Charolett Copes     Pancreatic cancer Neg Hx    Colon cancer Neg Hx    Prostate cancer Neg Hx     Review of Systems: Denies any recent fevers, chills or changes in his health  Physical Exam: Vital Signs BP 114/66 (BP Location: Left Arm, Patient Position: Sitting, Cuff Size: Normal)   Pulse 76   Ht 6' (1.829 m)   Wt 188 lb (85.3 kg)   SpO2 93%   BMI 25.50 kg/m   Physical Exam  Constitutional:      General: Not in acute distress.    Appearance: Normal appearance. Not ill-appearing.  HENT:     Head: Normocephalic and atraumatic.  Neck:     Musculoskeletal: Normal range of motion.  Cardiovascular:     Rate and Rhythm: Normal rate    Pulses: Normal pulses.  Pulmonary:     Effort: Pulmonary effort is normal. No respiratory distress.  Musculoskeletal: Normal range of motion.  Skin:    General: Skin is warm and dry.     Findings: No erythema or rash.  Neurological:     Mental Status: Alert and oriented to person, place, and time. Mental status is at baseline.  Psychiatric:        Mood and Affect: Mood normal.        Behavior: Behavior normal.     Assessment/Plan: The patient is scheduled for bilateral blepharoplasty with Dr. Orin Birk.  Risks, benefits, and alternatives of procedure discussed, questions answered and consent obtained.    Smoking Status: Non-smoker; Counseling Given?  N/A  I did discuss clearances from pulmonology and cardiology with the patient.  We will reach out to patient's cardiologist in regards to holding his Eliquis .Patient states that if needed, he is okay doing this procedure under local anesthetic.  I will discuss this possibility with Dr. Orin Birk.  Will also follow-up on a clearance from patient's primary care provider, [place]  [place].  Caprini Score: 9; Risk Factors include: Age, BMI > 25, history of DVT and length of planned surgery. Recommendation for mechanical and possible pharmacological prophylaxis. Encourage early ambulation.  Will most likely have patient restart his Eliquis  24 hours after surgery, but will discuss the possibility of postoperative Lovenox  with Dr. Orin Birk.  Pictures obtained: @consult   Post-op Rx sent to pharmacy:  Keflex  -patient has tramadol  at home.  Discussed with him that he can take Tylenol  and ibuprofen for his pain, and take tramadol  if needed postoperatively.  Patient also states he has Zofran  at home.  Instructed patient to hold multivitamins and fish oil  and any sort of supplements at least 1 week prior to surgery.  Instructed patient to hold Jardiance  the day of surgery.  Discussed with patient that we will plan for him to hold Eliquis  2 days prior to surgery once we receive the clearance.  He expressed understanding.  Patient was provided with the Blepharoplasty and General Surgical Risk consent document and Pain Medication Agreement prior to their appointment.  They had adequate time to read through the risk consent documents and Pain Medication Agreement. We also discussed them in person together during this preop appointment. All of their questions were answered  to their satisfaction.  Recommended calling if they have any further questions.  Risk consent form and Pain Medication Agreement to be scanned into patient's chart.  The consent was obtained with risks and complications reviewed which included bleeding, pain, scar, infection and the risk of anesthesia.  The patients questions were answered to the patients expressed satisfaction.   I discussed with the patient that given his medical history, he may be at higher risk of perioperative complications including cardiac and pulmonary complications.  Patient expressed understanding.  ADDEND: I spoke with Dr. Orin Birk about the possibility of using local anesthetic versus sedation.  She said she will proceed with local anesthetic, but will still plan to do it in the operating room.  This will now be done at the [facility] surgical center on the same day in the same time.  I did let the patient know about these changes.  He expressed understanding.   Electronically signed by: Harden Leyden, PA-C 06/17/2023 11:22 AM

## 2023-06-17 NOTE — Telephone Encounter (Signed)
 Surgical Clearance sent to [place] [phone]). I called the office to inform them that we need it returned quickly due to his surgery being on 06/28/23. Confirmation of receipt.

## 2023-06-17 NOTE — Telephone Encounter (Signed)
 Allie, CMA from patient's pcp office, [place]  [place], [place] left voicemail to confirm if we received surgical clearance form they faxed on 4/4. I called her back and left vm as requested by her advising that we had not received it. Per Marc Molly, RN @ Plastic Surgery, she re-faxed it today per Marc Gilbert request. I adv on the message to please have PCP complete it and fax it to our office again at [phone]

## 2023-06-17 NOTE — H&P (View-Only) (Signed)
 Patient ID: Marc Gilbert, male    DOB: 1930-08-18, 89 y.o.   MRN: [id number]  Chief Complaint  Patient presents with   Pre-op  Exam      ICD-10-CM   1. Dermatochalasis of both upper eyelids  H02.831    H02.834        History of Present Illness: Marc Gilbert is a 89 y.o.  male  with a history of dermatochalasis of the upper eyelids.  He presents for preoperative evaluation for upcoming procedure, blepharoplasty, scheduled for 06/28/2023 with Dr. Orin Birk.  The patient has not had problems with anesthesia.  Patient reports he is current taking Eliquis .  Summary of Previous Visit: Patient had been seen back in the fall 2024 for and was scheduled for bilateral upper eyelid blepharoplasty in October 2024.  His surgery was subsequently canceled due to hospitalization due to respiratory issues.  Patient was then had an appointment with Dr. Orin Birk on 05/18/2023.  It was noted that it would be preferable that patient not be put to sleep and that procedure should be fine under sedation.  Job: retired  PMH Significant for: PMH Significant for: Hypertension, CAD, acute on chronic diastolic heart failure, atrial flutter, DVT, atrial fibrillation, COPD, interstitial lung disease, hypothyroidism, CKD, hyperlipidemia, history of CABG.  Patient states he is not a smoker.  Patient reports he is taking testosterone .  He does report he had a blood clot in his right lower extremity in 2002 after his shoulder replacement.  He denies any family history of blood clots.  He denies any personal or family history of clotting diseases.  He denies any recent surgeries, traumas or infections.  Patient states that he had a double bypass in the past but denies history of MI.  He denies history of stroke.  He denies any history ofCrohn's disease or ulcerative colitis.  He does report he has history of COPD.  He states that he usually is on 2 L of oxygen .  He states that when he is active though that he has to go  up to 6 L of oxygen .He denies any recent fevers, chills or changes in his health.  He denies any new cardiac or pulmonary issues.  He denies any new chest pain, shortness of breath.  Per chart review, patient reached out to his pulmonologist, Dr. Villa Greaser, on 05/18/2023.  Dr. Alois Arnt responded "he can be cleared for low risk surgery requiring local anesthesia.  However any form of sedation would increase the risk and this should be discussed with the surgeon".  Per chart review, patient was seen by cardiology on 05/14/2023.  His perioperative risk of major cardiac event is 0.4%.  Based on ACC/AHA guidelines, patient would be at an acceptable risk for the planned procedure without any further cardiovascular testing.  It was noted that if patient decided to pursue surgery, he would need recommendations from pharmacy regarding his Eliquis  hold.  Patient states that he is currently taking tramadol  for back pain he has been experiencing.  Past Medical History: Allergies: No Known Allergies  Current Medications:  Current Outpatient Medications:    acetaminophen  (TYLENOL ) 500 MG tablet, Take 1,000 mg by mouth every 6 (six) hours as needed for mild pain or moderate pain (for pain)., Disp: , Rfl:    albuterol  (PROVENTIL ) (5 MG/ML) 0.5% nebulizer solution, Take 0.5 mLs (2.5 mg total) by nebulization every 6 (six) hours as needed for wheezing or shortness of breath., Disp: 20 mL, Rfl: 12   apixaban  (ELIQUIS )  5 MG TABS tablet, Take 1 tablet (5 mg total) by mouth 2 (two) times daily., Disp: 60 tablet, Rfl: 6   atorvastatin  (LIPITOR ) 40 MG tablet, Take 1 tablet (40 mg total) by mouth at bedtime., Disp: 90 tablet, Rfl: 3   cephALEXin  (KEFLEX ) 500 MG capsule, Take 1 capsule (500 mg total) by mouth 4 (four) times daily for 3 days., Disp: 12 capsule, Rfl: 0   diclofenac Sodium (VOLTAREN) 1 % GEL, Apply 1 application  topically See admin instructions. Apply 2-4 grams of gel topically to both knees once a day, Disp: , Rfl:     docusate sodium  (COLACE) 250 MG capsule, Take 250 mg by mouth daily., Disp: , Rfl:    empagliflozin  (JARDIANCE ) 10 MG TABS tablet, Take 1 tablet (10 mg total) by mouth daily., Disp: 90 tablet, Rfl: 3   gabapentin  (NEURONTIN ) 300 MG capsule, Take 300-600 mg by mouth See admin instructions. Take 300 mg by mouth between 3 AM-4 AM, 600 mg by mouth after breakfast, 600 mg between 3 PM-4 PM, and 300 mg at bedtime, Disp: , Rfl:    Glycerin-Hypromellose-PEG 400 (DRY EYE RELIEF DROPS) 0.2-0.2-1 % SOLN, Place 1 drop into both eyes 3 (three) times daily as needed (for dryness)., Disp: , Rfl:    guaiFENesin (MUCINEX) 600 MG 12 hr tablet, Take by mouth 2 (two) times daily., Disp: , Rfl:    INCRUSE ELLIPTA 62.5 MCG/ACT AEPB, 1 puff daily., Disp: , Rfl:    levothyroxine  (SYNTHROID ) 50 MCG tablet, Take 50 mcg by mouth daily before breakfast., Disp: , Rfl:    Melatonin 10 MG TABS, Take 10 mg by mouth at bedtime., Disp: , Rfl:    Multiple Vitamins-Minerals (ONE-A-DAY MENS 50+ ADVANTAGE) TABS, Take 1 tablet by mouth daily with breakfast., Disp: , Rfl:    Omega-3 Fatty Acids (FISH OIL  OMEGA-3 PO), Take 1,960 mg by mouth daily., Disp: , Rfl:    ondansetron  (ZOFRAN ) 4 MG tablet, Take 1 tablet (4 mg total) by mouth every 8 (eight) hours as needed for nausea or vomiting., Disp: 20 tablet, Rfl: 0   oxybutynin  (DITROPAN ) 5 MG tablet, Take 5 mg by mouth at bedtime., Disp: , Rfl:    polyethylene glycol (MIRALAX  / GLYCOLAX ) 17 g packet, Take 8.5 g by mouth daily as needed for mild constipation or moderate constipation., Disp: , Rfl:    sodium chloride  (OCEAN) 0.65 % SOLN nasal spray, Place 1 spray into both nostrils as needed for congestion., Disp: , Rfl:    sodium chloride  HYPERTONIC 3 % nebulizer solution, Take by nebulization daily as needed for other., Disp: 750 mL, Rfl: 12   Tamsulosin  HCl (FLOMAX ) 0.4 MG CAPS, Take 0.4 mg by mouth at bedtime., Disp: , Rfl:    testosterone  cypionate (DEPOTESTOSTERONE CYPIONATE) 200  MG/ML injection, Inject 66.67 mg into the skin every Wednesday., Disp: , Rfl:    traMADol  (ULTRAM ) 50 MG tablet, Take 50 mg by mouth 3 (three) times daily as needed., Disp: , Rfl:    carvedilol  (COREG ) 3.125 MG tablet, Take 1 tablet (3.125 mg total) by mouth 2 (two) times daily., Disp: 180 tablet, Rfl: 3  Past Medical Problems: Past Medical History:  Diagnosis Date   Arthritis    "right hand; back" (08/30/2015)   Cancer (HCC)    skin  squamous and basal   Chronic lower back pain    CKD (chronic kidney disease), stage III (HCC)    Stage III   COPD (chronic obstructive pulmonary disease) (HCC)  no home O2   Coronary artery disease    Dysplastic colon polyp age 61   carcinoma in situ   GERD (gastroesophageal reflux disease)    occ   H/O hiatal hernia    Hyperlipidemia    Hypertension    Prostate cancer (HCC) 07/2014   active surveillance, Glisson 6    Past Surgical History: Past Surgical History:  Procedure Laterality Date   A-FLUTTER ABLATION N/A 08/10/2022   Procedure: A-FLUTTER ABLATION;  Surgeon: Mealor, Donnamae Gaba, MD;  Location: [facility] INVASIVE CV LAB;  Service: Cardiovascular;  Laterality: N/A;   CARDIAC CATHETERIZATION     CATARACT EXTRACTION W/ INTRAOCULAR LENS IMPLANT Left 08/27/2015   CORONARY ARTERY BYPASS GRAFT N/A 08/14/2016   Procedure: CORONARY ARTERY BYPASS GRAFTING (CABG)x2, using left internal mammary artery and right greater saphenous vein harvested endoscopically;  Surgeon: Bartley Lightning, MD;  Location: [facility] OR;  Service: Open Heart Surgery;  Laterality: N/A;   JOINT REPLACEMENT     LEFT HEART CATH AND CORONARY ANGIOGRAPHY N/A 08/11/2016   Procedure: Left Heart Cath and Coronary Angiography;  Surgeon: Arty Binning, MD;  Location: [facility] INVASIVE CV LAB;  Service: Cardiovascular;  Laterality: N/A;   MOHS SURGERY     multiple SCC   PROSTATE BIOPSY  07/18/2014   TEE WITHOUT CARDIOVERSION N/A 08/14/2016   Procedure: TRANSESOPHAGEAL ECHOCARDIOGRAM (TEE);  Surgeon:  Bartley Lightning, MD;  Location: [facility] OR;  Service: Open Heart Surgery;  Laterality: N/A;   TONSILLECTOMY     TOTAL HIP ARTHROPLASTY  05/26/2011   Procedure: TOTAL HIP ARTHROPLASTY;  Surgeon: Shirlee Dotter, MD;  Location: [facility] OR;  Service: Orthopedics;  Laterality: Right;   TOTAL HIP ARTHROPLASTY Left 12/25/2021   Procedure: LEFT TOTAL HIP ARTHROPLASTY ANTERIOR APPROACH;  Surgeon: Arnie Lao, MD;  Location: [facility] ORS;  Service: Orthopedics;  Laterality: Left;   TOTAL SHOULDER ARTHROPLASTY Right 06/18/2020   Procedure: TOTAL SHOULDER ARTHROPLASTY;  Surgeon: Osa Blase, MD;  Location: [facility] ORS;  Service: Orthopedics;  Laterality: Right;   ULTRASOUND GUIDANCE FOR VASCULAR ACCESS  08/11/2016   Procedure: Ultrasound Guidance For Vascular Access;  Surgeon: Arty Binning, MD;  Location: [facility] INVASIVE CV LAB;  Service: Cardiovascular;;    Social History: Social History   Socioeconomic History   Marital status: Married    Spouse name: Climmie Damme   Number of children: 2   Years of education: Not on file   Highest education level: Master's degree (e.g., MA, MS, MEng, MEd, MSW, MBA)  Occupational History   Occupation: retired 1996  Tobacco Use   Smoking status: Former    Current packs/day: 0.00    Average packs/day: 0.5 packs/day for 25.0 years (12.5 ttl pk-yrs)    Types: Cigarettes    Start date: 05/25/1963    Quit date: 05/24/1988    Years since quitting: 35.0   Smokeless tobacco: Never  Vaping Use   Vaping status: Never Used  Substance and Sexual Activity   Alcohol use: Yes    Alcohol/week: 10.0 standard drinks of alcohol    Types: 5 Glasses of wine, 5 Cans of beer per week    Comment: daily   Drug use: No   Sexual activity: Not Currently  Other Topics Concern   Not on file  Social History Narrative   Lives at [place], married for 68 years, 2 daughters   Social Drivers of Corporate investment banker Strain: Not on file  Food Insecurity: No Food Insecurity (10/31/2022)    Hunger Vital  Sign    Worried About Programme researcher, broadcasting/film/video in the Last Year: Never true    Ran Out of Food in the Last Year: Never true  Transportation Needs: No Transportation Needs (10/31/2022)   PRAPARE - Administrator, Civil Service (Medical): No    Lack of Transportation (Non-Medical): No  Physical Activity: Not on file  Stress: Not on file  Social Connections: Not on file  Intimate Partner Violence: Not At Risk (10/31/2022)   Humiliation, Afraid, Rape, and Kick questionnaire    Fear of Current or Ex-Partner: No    Emotionally Abused: No    Physically Abused: No    Sexually Abused: No    Family History: Family History  Problem Relation Age of Onset   Breast cancer Mother 38       mastectomy   Diabetes Father    Breast cancer Daughter        2005. Lumpectomy with chemo and radiation. Under the care of Dr. Charolett Copes     Pancreatic cancer Neg Hx    Colon cancer Neg Hx    Prostate cancer Neg Hx     Review of Systems: Denies any recent fevers, chills or changes in his health  Physical Exam: Vital Signs BP 114/66 (BP Location: Left Arm, Patient Position: Sitting, Cuff Size: Normal)   Pulse 76   Ht 6' (1.829 m)   Wt 188 lb (85.3 kg)   SpO2 93%   BMI 25.50 kg/m   Physical Exam  Constitutional:      General: Not in acute distress.    Appearance: Normal appearance. Not ill-appearing.  HENT:     Head: Normocephalic and atraumatic.  Neck:     Musculoskeletal: Normal range of motion.  Cardiovascular:     Rate and Rhythm: Normal rate    Pulses: Normal pulses.  Pulmonary:     Effort: Pulmonary effort is normal. No respiratory distress.  Musculoskeletal: Normal range of motion.  Skin:    General: Skin is warm and dry.     Findings: No erythema or rash.  Neurological:     Mental Status: Alert and oriented to person, place, and time. Mental status is at baseline.  Psychiatric:        Mood and Affect: Mood normal.        Behavior: Behavior normal.     Assessment/Plan: The patient is scheduled for bilateral blepharoplasty with Dr. Orin Birk.  Risks, benefits, and alternatives of procedure discussed, questions answered and consent obtained.    Smoking Status: Non-smoker; Counseling Given?  N/A  I did discuss clearances from pulmonology and cardiology with the patient.  We will reach out to patient's cardiologist in regards to holding his Eliquis .Patient states that if needed, he is okay doing this procedure under local anesthetic.  I will discuss this possibility with Dr. Orin Birk.  Will also follow-up on a clearance from patient's primary care provider, [place]  Miller.  Caprini Score: 9; Risk Factors include: Age, BMI > 25, history of DVT and length of planned surgery. Recommendation for mechanical and possible pharmacological prophylaxis. Encourage early ambulation.  Will most likely have patient restart his Eliquis  24 hours after surgery, but will discuss the possibility of postoperative Lovenox  with Dr. Orin Birk.  Pictures obtained: @consult   Post-op Rx sent to pharmacy:  Keflex  -patient has tramadol  at home.  Discussed with him that he can take Tylenol  and ibuprofen for his pain, and take tramadol  if needed postoperatively.  Patient also states he has Zofran  at home.  Instructed patient to hold multivitamins and fish oil  and any sort of supplements at least 1 week prior to surgery.  Instructed patient to hold Jardiance  the day of surgery.  Discussed with patient that we will plan for him to hold Eliquis  2 days prior to surgery once we receive the clearance.  He expressed understanding.  Patient was provided with the Blepharoplasty and General Surgical Risk consent document and Pain Medication Agreement prior to their appointment.  They had adequate time to read through the risk consent documents and Pain Medication Agreement. We also discussed them in person together during this preop appointment. All of their questions were answered  to their satisfaction.  Recommended calling if they have any further questions.  Risk consent form and Pain Medication Agreement to be scanned into patient's chart.  The consent was obtained with risks and complications reviewed which included bleeding, pain, scar, infection and the risk of anesthesia.  The patients questions were answered to the patients expressed satisfaction.   I discussed with the patient that given his medical history, he may be at higher risk of perioperative complications including cardiac and pulmonary complications.  Patient expressed understanding.  ADDEND: I spoke with Dr. Orin Birk about the possibility of using local anesthetic versus sedation.  She said she will proceed with local anesthetic, but will still plan to do it in the operating room.  This will now be done at the [facility] surgical center on the same day in the same time.  I did let the patient know about these changes.  He expressed understanding.   Electronically signed by: Harden Leyden, PA-C 06/17/2023 11:22 AM

## 2023-06-17 NOTE — Telephone Encounter (Signed)
 Fax sent to [facility] NP/James Hochrein MD [phone]) requesting surgical clearance for scheduled surgery on 06/28/23. Confirmation of receipt.

## 2023-06-17 NOTE — Telephone Encounter (Signed)
   Pre-operative Risk Assessment    Patient Name: Marc Gilbert  DOB: 1930/06/05 MRN: [id number]   Date of last office visit: 05/14/23 Date of next office visit: 11/09/23   Request for Surgical Clearance    Procedure:   Blepharoplasty   Date of Surgery:  Clearance 06/28/23                                 Surgeon:  Lamount Pimple, PA-C Surgeon's Group or Practice Name:  [facility]  Phone number:  [phone] Fax number:  [phone]   Type of Clearance Requested:   - Medical  - Pharmacy:  Hold Aspirin  2 days prior    Type of Anesthesia:  Not Indicated   Additional requests/questions:    [name]   06/17/2023, 3:49 PM

## 2023-06-22 DIAGNOSIS — R7989 Other specified abnormal findings of blood chemistry: Secondary | ICD-10-CM | POA: Diagnosis not present

## 2023-06-22 DIAGNOSIS — R7303 Prediabetes: Secondary | ICD-10-CM | POA: Diagnosis not present

## 2023-06-22 DIAGNOSIS — N1831 Chronic kidney disease, stage 3a: Secondary | ICD-10-CM | POA: Diagnosis not present

## 2023-06-22 DIAGNOSIS — R310 Gross hematuria: Secondary | ICD-10-CM | POA: Diagnosis not present

## 2023-06-22 DIAGNOSIS — E039 Hypothyroidism, unspecified: Secondary | ICD-10-CM | POA: Diagnosis not present

## 2023-06-22 NOTE — Telephone Encounter (Signed)
   Primary Cardiologist: Eilleen Grates, MD  Chart reviewed as part of pre-operative protocol coverage. Given past medical history and time since last visit, based on ACC/AHA guidelines, Eual L Mankowski would be at acceptable risk for the planned procedure without further cardiovascular testing.   Patient was advised that if he develops new symptoms prior to surgery to contact our office to arrange a follow-up appointment.  He verbalized understanding.  Per office protocol, patient can hold Eliquis  for 2 days prior to procedure.   Patient will not need bridging with Lovenox  (enoxaparin ) around procedure.  I will route this recommendation to the requesting party via Epic fax function and remove from pre-op  pool.  Please call with questions.  Gerldine Koch, NP-C  06/22/2023, 3:28 PM [address] [place], [place] [address] Office [phone] Fax [phone]

## 2023-06-22 NOTE — Telephone Encounter (Signed)
 Patient with diagnosis of atrial fibrillation on Eliquis  for anticoagulation.    Procedure:   Blepharoplasty    Date of Surgery:  Clearance 06/28/23      CHA2DS2-VASc Score = 5   This indicates a 7.2% annual risk of stroke. The patient's score is based upon: CHF History: 1 HTN History: 1 Diabetes History: 0 Stroke History: 0 Vascular Disease History: 1 Age Score: 2 Gender Score: 0   CrCl 38 Platelet count 201  Patient has not had an Afib/aflutter ablation within the last 3 months or DCCV within the last 30 days  Per office protocol, patient can hold Eliquis  for 2 days prior to procedure.   Patient will not need bridging with Lovenox  (enoxaparin ) around procedure.  **This guidance is not considered finalized until pre-operative APP has relayed final recommendations.**

## 2023-06-23 ENCOUNTER — Encounter (HOSPITAL_COMMUNITY): Payer: Self-pay | Admitting: Plastic Surgery

## 2023-06-24 ENCOUNTER — Other Ambulatory Visit: Payer: Self-pay

## 2023-06-24 ENCOUNTER — Encounter (HOSPITAL_COMMUNITY): Payer: Self-pay | Admitting: Plastic Surgery

## 2023-06-24 NOTE — Anesthesia Preprocedure Evaluation (Addendum)
 Anesthesia Evaluation  Patient identified by MRN, date of birth, ID band Patient awake    Reviewed: Allergy & Precautions, NPO status , Patient's Chart, lab work & pertinent test results  History of Anesthesia Complications Negative for: history of anesthetic complications  Airway Mallampati: II  TM Distance: >3 FB Neck ROM: Full   Comment: Previous grade III view with MAC 3, easy mask Dental  (+) Dental Advisory Given   Pulmonary neg shortness of breath, neg sleep apnea, COPD,  oxygen  dependent, neg recent URI, former smoker Chronic hypoxic respiratory failure due to pulmonary fibrosis, on home O2 at 6 LPM via nasal cannula 24/7. He uses 2 LPM via nasal cannula on the portable O2 tank when he is out. He is currently on 2 LPM in preop with an SpO2 of 98%. Has not had a recent increase in O2 requirement.   Pulmonary exam normal breath sounds clear to auscultation       Cardiovascular hypertension (carvedilol ), Pt. on home beta blockers (-) angina + CAD, + CABG, +CHF and + DVT  + dysrhythmias (s/p a-flutter ablation, RBBB)  Rhythm:Regular Rate:Normal  HLD  TTE 10/31/2022: IMPRESSIONS    1. No left ventricular thrombus is seen (Definity  contrast was used).  Left ventricular ejection fraction, by estimation, is 45 to 50%. The left  ventricle has mildly decreased function. The left ventricle demonstrates  global hypokinesis. Left ventricular   diastolic parameters are consistent with Grade I diastolic dysfunction  (impaired relaxation).   2. Right ventricular systolic function is mildly reduced. The right  ventricular size is moderately enlarged. There is mildly elevated  pulmonary artery systolic pressure. The estimated right ventricular  systolic pressure is 38.0 mmHg.   3. Left atrial size was mild to moderately dilated.   4. Right atrial size was mildly dilated.   5. The mitral valve is normal in structure. No evidence of mitral  valve  regurgitation. No evidence of mitral stenosis.   6. The aortic valve is tricuspid. Aortic valve regurgitation is not  visualized. No aortic stenosis is present.   7. The inferior vena cava is normal in size with greater than 50%  respiratory variability, suggesting right atrial pressure of 3 mmHg.     Neuro/Psych neg Seizures  Neuromuscular disease (chronic low back pain)    GI/Hepatic Neg liver ROS, hiatal hernia,GERD  ,,  Endo/Other  neg diabetesHypothyroidism    Renal/GU CRFRenal disease   Prostate cancer    Musculoskeletal  (+) Arthritis , Osteoarthritis,    Abdominal   Peds  Hematology negative hematology ROS (+) Lab Results      Component                Value               Date                      WBC                      4.6                 10/31/2022                HGB                      15.9                10/31/2022  HCT                      50.1                10/31/2022                MCV                      96.2                10/31/2022                PLT                      201                 10/31/2022              Anesthesia Other Findings Last Eliquis : 06/25/2023  Reproductive/Obstetrics                             Anesthesia Physical Anesthesia Plan  ASA: 4  Anesthesia Plan: MAC   Post-op Pain Management: Tylenol  PO (pre-op )*   Induction:   PONV Risk Score and Plan: 1 and Treatment may vary due to age or medical condition  Airway Management Planned: Natural Airway and Nasal Cannula  Additional Equipment:   Intra-op Plan:   Post-operative Plan:   Informed Consent: I have reviewed the patients History and Physical, chart, labs and discussed the procedure including the risks, benefits and alternatives for the proposed anesthesia with the patient or authorized representative who has indicated his/her understanding and acceptance.     Dental advisory given  Plan Discussed with: CRNA and  Anesthesiologist  Anesthesia Plan Comments: (PAT note written 06/24/2023 by Allison Zelenak, PA-C.  This will be performed with local anesthesia. Discussed with patient risks of MAC including, but not limited to, minor pain or discomfort, hearing people in the room, and possible need for backup general anesthesia. Risks for general anesthesia also discussed including, but not limited to, sore throat, hoarse voice, chipped/damaged teeth, injury to vocal cords, nausea and vomiting, allergic reactions, lung infection, heart attack, stroke, and death. All questions answered. )       Anesthesia Quick Evaluation

## 2023-06-24 NOTE — Progress Notes (Addendum)
 SDW CALL  Patient was given pre-op  instructions over the phone. The opportunity was given for the patient to ask questions. No further questions asked. Patient verbalized understanding of instructions given.   PCP - [place]  Annabell Key, PA Cardiologist - Dr. Manon Seidel - clearance 06/17/23 EP= Augustus Mealor  PPM/ICD - denies   Chest x-ray - 10/30/22 EKG - 04/24/23 Stress Test -  ECHO - 10/31/22 Cardiac Cath - 08/11/16  Sleep Study - denies   Patient instructed to stop Jardiance  on 5/8, 72 hours to the procedure.    Blood Thinner Instructions: patient was instructed to hold Eliquis  for 2 days prior to the procedure - last dose will be on Friday, 5/9 - patient verbalized understanding Aspirin  Instructions: n/a  ERAS Protcol - NPO   COVID TEST- n/a   Anesthesia review: yes  Patient denies shortness of breath, fever, cough and chest pain over the phone call   All instructions explained to the patient, with a verbal understanding of the material. Patient agrees to go over the instructions while at home for a better understanding.    Patient was asked if he had pen and paper to write down instructions and patient informed nurse he did not.  Nurse offered to call patient back at a better time and the patient refused and informed nurse that he had already received this phone call.  Patient was very upset that his location of surgery had been changed and that his arrival time was now 1115 and not 1130.   This nurse apologized to the patient for the time and location change.    Patient denies chest pain and shortness of breath at the time of the call.    Patient was informed that he needed a responsible adult to drive him home after the procedure and remain with him for 24 hours.  Patient agreed that he would have someone.    Education assessment not completed on SDW call.

## 2023-06-24 NOTE — Progress Notes (Signed)
 Anesthesia Chart Review: SAME DAY WORK-UP   Case: [id number] Date/Time: 06/28/23 1330   Procedure: BLEPHAROPLASTY (Bilateral: Eye)   Anesthesia type: Monitor Anesthesia Care   Diagnosis:      Dermatochalasis of right upper eyelid [H02.831]     Dermatochalasis of left upper eyelid [H02.834]   Pre-op  diagnosis: bilateral dermatochalasis   [facility] / [facility]   Surgeons: Thornell Flirt, DO       DISCUSSION: Marc Gilbert is a 89 year old male scheduled for the above procedure.  History includes former smoker (quit 1990), COPD, chronic hypoxic respiratory failure secondary to idiopathic pulmonary fibrosis (on supplemental O2), HTN, HLD, CAD (CABG: LIMA-LAD, SVG-OM 08/14/16), afib/aflutter (s/p ablation 08/10/22), HFmrEF, DVT, CKD (stage III), GERD, hiatal hernia, prostate cancer (s/p radiation 2021), skin cancer, osteoarthritis (right THA 05/26/11; right TSA 06/18/20; left THA 12/25/21)  Reoperative cardiology input outlined on 06/17/2023 by Slater Duncan, NP: "Given past medical history and time since last visit, based on ACC/AHA guidelines, Loi L Cozort would be at acceptable risk for the planned procedure without further cardiovascular testing.    Marc Gilbert was advised that if he develops new symptoms prior to surgery to contact our office to arrange a follow-up appointment.  He verbalized understanding.   Per office protocol, Marc Gilbert can hold Eliquis  for 2 days prior to procedure.   Marc Gilbert will not need bridging with Lovenox  (enoxaparin ) around procedure."  Pulmonologist Dr. Villa Greaser also provided preoperative input (Marc Gilbert Message 05/18/23). He wrote, "He can be cleared for low risk surgery requiring local anesthesia.  However any form of sedation would increase the risk & this should be discussed with the surgeon". Ofev  discontinued in March 2025 due to debilitatin GI side effects.   PCP Annabell Key, [place]  E, PA also signed a letter of medical clearance for surgery (scanned under Media  tab).   PAT RN interview is still pending and will verify last Eliquis  and Jardiance  doses. Anesthesia team to evaluate on the day of surgery.    VS:  Wt Readings from Last 3 Encounters:  06/17/23 85.3 kg  05/14/23 87.5 kg  04/20/23 89 kg   BP Readings from Last 3 Encounters:  06/17/23 114/66  05/14/23 100/60  04/20/23 124/72   Pulse Readings from Last 3 Encounters:  06/17/23 76  05/14/23 79  04/20/23 99     PROVIDERS: Annabell Key, [place]  E, PA is PCP  Eilleen Grates, MD is cardiologist Celene Coins, MD is pulmonologist Travis Friedman, MD is RAD-ONC   LABS: For labs as indicated on arrival. Most recent results in [facility] include: Lab Results  Component Value Date   WBC 4.6 10/31/2022   HGB 15.9 10/31/2022   HCT 50.1 10/31/2022   PLT 201 10/31/2022   GLUCOSE 112 (H) 11/10/2022   NA 139 11/10/2022   K 5.3 (H) 11/10/2022   CL 100 11/10/2022   CREATININE 1.37 (H) 11/10/2022   BUN 24 11/10/2022   CO2 24 11/10/2022   TSH 15.000 (H) 08/07/2022   INR 1.39 08/14/2016   HGBA1C 5.7 (H) 08/10/2016   HPF at [facility] on 05/20/23 showed total protein 7.4, albumin  4.4, total bilirubin 1.6, direct bilirubin 0.4, indirect bilirubin 1.2, alkaline phosphatase 80, AST 38, ALT 42.   IMAGES: CT Chest High Resolution 02/07/23: IMPRESSION: 1. Pulmonary parenchymal pattern of fibrosis, as detailed above, progressive from 04/08/2018. Findings are consistent with UIP per consensus guidelines: Diagnosis of Idiopathic Pulmonary Fibrosis: An Official ATS/ERS/JRS/ALAT Clinical Practice Guideline. Am Annie Barton Crit Care Med Vol 198, Iss  5, [id number], Oct 17 2016. 2. Chronic calcific pancreatitis. 3.  Aortic atherosclerosis (ICD10-I70.0). 4. Enlarged pulmonic trunk, indicative of pulmonary arterial hypertension.     EKG: 05/14/23: Normal sinus rhythm Possible Left atrial enlargement Right bundle branch block Left anterior fascicular block Bifascicular block When compared  with ECG of 24-Nov-2022 12:58, Premature ventricular complexes are no longer Present Left anterior fascicular block is now Present Confirmed by Marlana Silvan ([id number]) on 05/14/2023 1:34:26 PM   CV: TTE 10/31/22: IMPRESSIONS   1. No left ventricular thrombus is seen (Definity  contrast was used).  Left ventricular ejection fraction, by estimation, is 45 to 50%. The left  ventricle has mildly decreased function. The left ventricle demonstrates  global hypokinesis. Left ventricular   diastolic parameters are consistent with Grade I diastolic dysfunction  (impaired relaxation).   2. Right ventricular systolic function is mildly reduced. The right  ventricular size is moderately enlarged. There is mildly elevated  pulmonary artery systolic pressure. The estimated right ventricular  systolic pressure is 38.0 mmHg.   3. Left atrial size was mild to moderately dilated.   4. Right atrial size was mildly dilated.   5. The mitral valve is normal in structure. No evidence of mitral valve  regurgitation. No evidence of mitral stenosis.   6. The aortic valve is tricuspid. Aortic valve regurgitation is not  visualized. No aortic stenosis is present.   7. The inferior vena cava is normal in size with greater than 50%  respiratory variability, suggesting right atrial pressure of 3 mmHg.  - Comparison(s): No significant change from prior study. Prior images  reviewed side by side.    Long term ZioXT monitor 06/10/22 - 06/13/22: Atrial flutter  Predominantly slow ventricular rates Pauses with the longest being six seconds.  Ventricular ectopy was noted.     US  Carotid 08/13/16: Summary:  Bilateral: mild calcific plaque origin ICA. 1-39% ICA plaquing.  Vertebral artery flow is antegrade.          Last cardiac cath was on 08/11/16 prior to CABG.   Past Medical History:  Diagnosis Date   Arthritis    "right hand; back" (08/30/2015)   Cancer (HCC)    skin  squamous and basal   Chronic lower back  pain    CKD (chronic kidney disease), stage III (HCC)    Stage III   COPD (chronic obstructive pulmonary disease) (HCC)    no home O2   Coronary artery disease    Dysplastic colon polyp age 88   carcinoma in situ   GERD (gastroesophageal reflux disease)    occ   H/O hiatal hernia    Hyperlipidemia    Hypertension    Prostate cancer (HCC) 07/2014   active surveillance, Glisson 6   Pulmonary fibrosis (HCC)     Past Surgical History:  Procedure Laterality Date   A-FLUTTER ABLATION N/A 08/10/2022   Procedure: A-FLUTTER ABLATION;  Surgeon: Mealor, Donnamae Gaba, MD;  [facility] INVASIVE CV LAB;  Service: Cardiovascular;  Laterality: N/A;   CARDIAC CATHETERIZATION     CATARACT EXTRACTION W/ INTRAOCULAR LENS IMPLANT Left 08/27/2015   CORONARY ARTERY BYPASS GRAFT N/A 08/14/2016   Procedure: CORONARY ARTERY BYPASS GRAFTING (CABG)x2, using left internal mammary artery and right greater saphenous vein harvested endoscopically;  Surgeon: Bartley Lightning, MD;  Location: [facility];  Service: Open Heart Surgery;  Laterality: N/A;   JOINT REPLACEMENT     LEFT HEART CATH AND CORONARY ANGIOGRAPHY N/A 08/11/2016   Procedure: Left Heart Cath and Coronary  Angiography;  Surgeon: Arty Binning, MD;  Location: [facility] INVASIVE CV LAB;  Service: Cardiovascular;  Laterality: N/A;   MOHS SURGERY     multiple SCC   PROSTATE BIOPSY  07/18/2014   TEE WITHOUT CARDIOVERSION N/A 08/14/2016   Procedure: TRANSESOPHAGEAL ECHOCARDIOGRAM (TEE);  Surgeon: Bartley Lightning, MD;  Location: [facility] OR;  Service: Open Heart Surgery;  Laterality: N/A;   TONSILLECTOMY     TOTAL HIP ARTHROPLASTY  05/26/2011   Procedure: TOTAL HIP ARTHROPLASTY;  Surgeon: Shirlee Dotter, MD;  Location: [facility] OR;  Service: Orthopedics;  Laterality: Right;   TOTAL HIP ARTHROPLASTY Left 12/25/2021   Procedure: LEFT TOTAL HIP ARTHROPLASTY ANTERIOR APPROACH;  Surgeon: Arnie Lao, MD;  Location: [facility] ORS;  Service: Orthopedics;  Laterality: Left;    TOTAL SHOULDER ARTHROPLASTY Right 06/18/2020   Procedure: TOTAL SHOULDER ARTHROPLASTY;  Surgeon: Osa Blase, MD;  Location: [facility] ORS;  Service: Orthopedics;  Laterality: Right;   ULTRASOUND GUIDANCE FOR VASCULAR ACCESS  08/11/2016   Procedure: Ultrasound Guidance For Vascular Access;  Surgeon: Arty Binning, MD;  Location: [facility] INVASIVE CV LAB;  Service: Cardiovascular;;    MEDICATIONS: No current facility-administered medications for this encounter.    acetaminophen  (TYLENOL ) 500 MG tablet   albuterol  (PROVENTIL ) (2.5 MG/3ML) 0.083% nebulizer solution   apixaban  (ELIQUIS ) 5 MG TABS tablet   atorvastatin  (LIPITOR ) 40 MG tablet   carvedilol  (COREG ) 3.125 MG tablet   docusate sodium  (COLACE) 250 MG capsule   empagliflozin  (JARDIANCE ) 10 MG TABS tablet   Glycerin-Hypromellose-PEG 400 (DRY EYE RELIEF DROPS) 0.2-0.2-1 % SOLN   guaiFENesin (MUCINEX) 600 MG 12 hr tablet   INCRUSE ELLIPTA 62.5 MCG/ACT AEPB   levothyroxine  (SYNTHROID ) 50 MCG tablet   Melatonin 10 MG TABS   oxybutynin  (DITROPAN ) 5 MG tablet   polyethylene glycol (MIRALAX  / GLYCOLAX ) 17 g packet   sodium chloride  (OCEAN) 0.65 % SOLN nasal spray   sodium chloride  HYPERTONIC 3 % nebulizer solution   Tamsulosin  HCl (FLOMAX ) 0.4 MG CAPS   testosterone  cypionate (DEPOTESTOSTERONE CYPIONATE) 200 MG/ML injection   traMADol  (ULTRAM ) 50 MG tablet   diclofenac Sodium (VOLTAREN) 1 % GEL   gabapentin  (NEURONTIN ) 300 MG capsule   Multiple Vitamins-Minerals (ONE-A-DAY MENS 50+ ADVANTAGE) TABS   Omega-3 Fatty Acids (FISH OIL  OMEGA-3 PO)     Ella Gun, PA-C Surgical Short Stay/Anesthesiology [facility] Phone [phone] [facility] Phone [phone] 06/24/2023 10:50 AM

## 2023-06-25 NOTE — Progress Notes (Signed)
 Patient made aware of surgery arrival time change. Patient to arrive at 1045 on 05.01/2024. Patient states he is at dinner and will call back if that is something he can do.

## 2023-06-28 ENCOUNTER — Other Ambulatory Visit: Payer: Self-pay

## 2023-06-28 ENCOUNTER — Ambulatory Visit (HOSPITAL_COMMUNITY): Payer: Self-pay | Admitting: Vascular Surgery

## 2023-06-28 ENCOUNTER — Encounter (HOSPITAL_COMMUNITY): Payer: Self-pay | Admitting: Plastic Surgery

## 2023-06-28 ENCOUNTER — Ambulatory Visit (HOSPITAL_COMMUNITY)
Admission: RE | Admit: 2023-06-28 | Discharge: 2023-06-28 | Disposition: A | Discharge status: Home / Self Care | Attending: Plastic Surgery | Admitting: Plastic Surgery

## 2023-06-28 ENCOUNTER — Encounter (HOSPITAL_COMMUNITY)
Admission: RE | Disposition: A | Discharge status: Home / Self Care | Payer: Self-pay | Source: Home / Self Care | Attending: Plastic Surgery

## 2023-06-28 DIAGNOSIS — I130 Hypertensive heart and chronic kidney disease with heart failure and stage 1 through stage 4 chronic kidney disease, or unspecified chronic kidney disease: Secondary | ICD-10-CM | POA: Diagnosis not present

## 2023-06-28 DIAGNOSIS — J449 Chronic obstructive pulmonary disease, unspecified: Secondary | ICD-10-CM | POA: Diagnosis not present

## 2023-06-28 DIAGNOSIS — N183 Chronic kidney disease, stage 3 unspecified: Secondary | ICD-10-CM | POA: Insufficient documentation

## 2023-06-28 DIAGNOSIS — I2510 Atherosclerotic heart disease of native coronary artery without angina pectoris: Secondary | ICD-10-CM | POA: Diagnosis not present

## 2023-06-28 DIAGNOSIS — E785 Hyperlipidemia, unspecified: Secondary | ICD-10-CM | POA: Insufficient documentation

## 2023-06-28 DIAGNOSIS — J9611 Chronic respiratory failure with hypoxia: Secondary | ICD-10-CM | POA: Diagnosis not present

## 2023-06-28 DIAGNOSIS — H02834 Dermatochalasis of left upper eyelid: Secondary | ICD-10-CM | POA: Insufficient documentation

## 2023-06-28 DIAGNOSIS — E1122 Type 2 diabetes mellitus with diabetic chronic kidney disease: Secondary | ICD-10-CM | POA: Insufficient documentation

## 2023-06-28 DIAGNOSIS — I509 Heart failure, unspecified: Secondary | ICD-10-CM | POA: Insufficient documentation

## 2023-06-28 DIAGNOSIS — I5033 Acute on chronic diastolic (congestive) heart failure: Secondary | ICD-10-CM | POA: Diagnosis not present

## 2023-06-28 DIAGNOSIS — I129 Hypertensive chronic kidney disease with stage 1 through stage 4 chronic kidney disease, or unspecified chronic kidney disease: Secondary | ICD-10-CM | POA: Diagnosis not present

## 2023-06-28 DIAGNOSIS — Z951 Presence of aortocoronary bypass graft: Secondary | ICD-10-CM | POA: Insufficient documentation

## 2023-06-28 DIAGNOSIS — Z7901 Long term (current) use of anticoagulants: Secondary | ICD-10-CM | POA: Diagnosis not present

## 2023-06-28 DIAGNOSIS — Z87891 Personal history of nicotine dependence: Secondary | ICD-10-CM

## 2023-06-28 DIAGNOSIS — H02831 Dermatochalasis of right upper eyelid: Secondary | ICD-10-CM | POA: Insufficient documentation

## 2023-06-28 DIAGNOSIS — N1831 Chronic kidney disease, stage 3a: Secondary | ICD-10-CM | POA: Diagnosis not present

## 2023-06-28 DIAGNOSIS — Z9981 Dependence on supplemental oxygen: Secondary | ICD-10-CM | POA: Diagnosis not present

## 2023-06-28 DIAGNOSIS — E039 Hypothyroidism, unspecified: Secondary | ICD-10-CM | POA: Insufficient documentation

## 2023-06-28 HISTORY — PX: BROW LIFT: SHX178

## 2023-06-28 HISTORY — DX: Pulmonary fibrosis, unspecified: J84.10

## 2023-06-28 LAB — CBC
HCT: 50.6 % (ref 39.0–52.0)
Hemoglobin: 16.4 g/dL (ref 13.0–17.0)
MCH: 32.5 pg (ref 26.0–34.0)
MCHC: 32.4 g/dL (ref 30.0–36.0)
MCV: 100.2 fL — ABNORMAL HIGH (ref 80.0–100.0)
Platelets: 156 10*3/uL (ref 150–400)
RBC: 5.05 MIL/uL (ref 4.22–5.81)
RDW: 14.2 % (ref 11.5–15.5)
WBC: 6.8 10*3/uL (ref 4.0–10.5)
nRBC: 0 % (ref 0.0–0.2)

## 2023-06-28 LAB — BASIC METABOLIC PANEL WITH GFR
Anion gap: 9 (ref 5–15)
BUN: 17 mg/dL (ref 8–23)
CO2: 22 mmol/L (ref 22–32)
Calcium: 8.5 mg/dL — ABNORMAL LOW (ref 8.9–10.3)
Chloride: 104 mmol/L (ref 98–111)
Creatinine, Ser: 1.07 mg/dL (ref 0.61–1.24)
GFR, Estimated: >60 mL/min (ref >60)
Glucose, Bld: 100 mg/dL — ABNORMAL HIGH (ref 70–99)
Potassium: 5 mmol/L (ref 3.5–5.1)
Sodium: 135 mmol/L (ref 135–145)

## 2023-06-28 SURGERY — BLEPHAROPLASTY
Anesthesia: Monitor Anesthesia Care | Site: Eye | Laterality: Bilateral

## 2023-06-28 MED ORDER — CHLORHEXIDINE GLUCONATE 0.12 % MT SOLN: 15.0000 mL | Freq: Once | OROMUCOSAL | Status: AC

## 2023-06-28 MED ORDER — BSS IO SOLN
INTRAOCULAR | Status: AC
  Filled 2023-06-28: qty 15

## 2023-06-28 MED ORDER — OXYCODONE HCL 5 MG PO TABS: 5.0000 mg | ORAL_TABLET | ORAL | Status: DC | PRN

## 2023-06-28 MED ORDER — CHLORHEXIDINE GLUCONATE 0.12 % MT SOLN
OROMUCOSAL | Status: AC
Start: 2023-06-28 — End: 2023-06-28
  Administered 2023-06-28: 15 mL via OROMUCOSAL
  Filled 2023-06-28: qty 15

## 2023-06-28 MED ORDER — AMISULPRIDE (ANTIEMETIC) 5 MG/2ML IV SOLN: 10.0000 mg | Freq: Once | INTRAVENOUS | Status: DC | PRN

## 2023-06-28 MED ORDER — BSS IO SOLN
INTRAOCULAR | Status: DC | PRN
  Administered 2023-06-28: 15 mL

## 2023-06-28 MED ORDER — ACETAMINOPHEN 500 MG PO TABS
1000.0000 mg | ORAL_TABLET | Freq: Once | ORAL | Status: AC
  Administered 2023-06-28: 1000 mg via ORAL
  Filled 2023-06-28: qty 2

## 2023-06-28 MED ORDER — FENTANYL CITRATE (PF) 100 MCG/2ML IJ SOLN: 25.0000 ug | INTRAMUSCULAR | Status: DC | PRN

## 2023-06-28 MED ORDER — ORAL CARE MOUTH RINSE: 15.0000 mL | Freq: Once | OROMUCOSAL | Status: AC

## 2023-06-28 MED ORDER — BUPIVACAINE HCL 0.25 % IJ SOLN
INTRAMUSCULAR | Status: DC | PRN
  Administered 2023-06-28: 10 mL

## 2023-06-28 MED ORDER — SODIUM CHLORIDE 0.9% FLUSH: 3.0000 mL | Freq: Two times a day (BID) | INTRAVENOUS | Status: DC

## 2023-06-28 MED ORDER — ACETAMINOPHEN 650 MG RE SUPP: 650.0000 mg | RECTAL | Status: DC | PRN

## 2023-06-28 MED ORDER — CHLORHEXIDINE GLUCONATE CLOTH 2 % EX PADS: 6.0000 | MEDICATED_PAD | Freq: Once | CUTANEOUS | Status: DC

## 2023-06-28 MED ORDER — SODIUM CHLORIDE 0.9 % IV SOLN: 250.0000 mL | INTRAVENOUS | Status: DC | PRN

## 2023-06-28 MED ORDER — LIDOCAINE-EPINEPHRINE 1 %-1:100000 IJ SOLN
INTRAMUSCULAR | Status: AC
  Filled 2023-06-28: qty 1

## 2023-06-28 MED ORDER — SODIUM CHLORIDE 0.9% FLUSH: 3.0000 mL | INTRAVENOUS | Status: DC | PRN

## 2023-06-28 MED ORDER — BUPIVACAINE HCL (PF) 0.25 % IJ SOLN
INTRAMUSCULAR | Status: AC
  Filled 2023-06-28: qty 30

## 2023-06-28 MED ORDER — LIDOCAINE-EPINEPHRINE 1 %-1:100000 IJ SOLN
INTRAMUSCULAR | Status: DC | PRN
  Administered 2023-06-28: 10 mL

## 2023-06-28 MED ORDER — LIDOCAINE HCL 2 % IJ SOLN
INTRAMUSCULAR | Status: AC
  Filled 2023-06-28: qty 20

## 2023-06-28 MED ORDER — ACETAMINOPHEN 325 MG PO TABS: 650.0000 mg | ORAL_TABLET | ORAL | Status: DC | PRN

## 2023-06-28 MED ORDER — NEOMYCIN-POLYMYXIN-DEXAMETH 3.5-10000-0.1 OP OINT
TOPICAL_OINTMENT | OPHTHALMIC | Status: AC
  Filled 2023-06-28: qty 3.5

## 2023-06-28 MED ORDER — TRIPLE ANTIBIOTIC 3.5-400-5000 EX OINT
TOPICAL_OINTMENT | CUTANEOUS | Status: DC | PRN
  Administered 2023-06-28: 1 via TOPICAL

## 2023-06-28 MED ORDER — CEFAZOLIN SODIUM-DEXTROSE 2-4 GM/100ML-% IV SOLN
2.0000 g | INTRAVENOUS | Status: AC
  Administered 2023-06-28: 2 g via INTRAVENOUS
  Filled 2023-06-28: qty 100

## 2023-06-28 MED ORDER — LACTATED RINGERS IV SOLN: INTRAVENOUS | Status: DC

## 2023-06-28 MED ORDER — 0.9 % SODIUM CHLORIDE (POUR BTL) OPTIME
TOPICAL | Status: DC | PRN
Start: 2023-06-28 — End: 2023-06-28
  Administered 2023-06-28: 1000 mL

## 2023-06-28 SURGICAL SUPPLY — 38 items
BAG COUNTER SPONGE SURGICOUNT (BAG) ×1 IMPLANT
BLADE SURG 15 STRL LF DISP TIS (BLADE) ×1 IMPLANT
BNDG ELASTIC 3INX 5YD STR LF (GAUZE/BANDAGES/DRESSINGS) ×1 IMPLANT
BNDG GAUZE DERMACEA FLUFF 4 (GAUZE/BANDAGES/DRESSINGS) ×1 IMPLANT
CANISTER SUCTION 3000ML PPV (SUCTIONS) ×1 IMPLANT
CLEANER TIP ELECTROSURG 2X2 (MISCELLANEOUS) ×1 IMPLANT
COVER SURGICAL LIGHT HANDLE (MISCELLANEOUS) ×1 IMPLANT
ELECT NDL TIP 2.8 STRL (NEEDLE) ×1 IMPLANT
ELECT NEEDLE TIP 2.8 STRL (NEEDLE) ×1 IMPLANT
ELECTRODE REM PT RTRN 9FT ADLT (ELECTROSURGICAL) ×1 IMPLANT
GAUZE SPONGE 4X4 12PLY STRL (GAUZE/BANDAGES/DRESSINGS) ×1 IMPLANT
GAUZE XEROFORM 1X8 LF (GAUZE/BANDAGES/DRESSINGS) ×1 IMPLANT
GLOVE BIO SURGEON STRL SZ7.5 (GLOVE) ×1 IMPLANT
GLOVE BIOGEL PI IND STRL 8 (GLOVE) ×1 IMPLANT
GOWN STRL REUS W/ TWL LRG LVL3 (GOWN DISPOSABLE) ×2 IMPLANT
GOWN STRL REUS W/ TWL XL LVL3 (GOWN DISPOSABLE) ×1 IMPLANT
KIT BASIN OR (CUSTOM PROCEDURE TRAY) ×1 IMPLANT
KIT TURNOVER KIT B (KITS) ×1 IMPLANT
MARKER SKIN DUAL TIP RULER LAB (MISCELLANEOUS) ×1 IMPLANT
NDL PRECISIONGLIDE 27X1.5 (NEEDLE) ×1 IMPLANT
NEEDLE PRECISIONGLIDE 27X1.5 (NEEDLE) ×1 IMPLANT
NS IRRIG 1000ML POUR BTL (IV SOLUTION) ×1 IMPLANT
PAD ARMBOARD POSITIONER FOAM (MISCELLANEOUS) ×2 IMPLANT
PENCIL BUTTON HOLSTER BLD 10FT (ELECTRODE) ×1 IMPLANT
SET HNDPC FAN SPRY TIP SCT (DISPOSABLE) IMPLANT
SPONGE T-LAP 18X18 ~~LOC~~+RFID (SPONGE) ×1 IMPLANT
SUT CHROMIC 6 0 G 1 (SUTURE) IMPLANT
SUT PDS AB 5-0 P3 18 (SUTURE) IMPLANT
SUT PROLENE 4 0 P 3 18 (SUTURE) IMPLANT
SUT PROLENE 6 0 P 1 18 (SUTURE) IMPLANT
SUT SILK 4 0 PS 2 (SUTURE) IMPLANT
SUT VIC AB 4-0 P-3 18X BRD (SUTURE) IMPLANT
TOWEL GREEN STERILE (TOWEL DISPOSABLE) ×1 IMPLANT
TOWEL GREEN STERILE FF (TOWEL DISPOSABLE) ×1 IMPLANT
TRAY ENT MC OR (CUSTOM PROCEDURE TRAY) ×1 IMPLANT
TUBE CONNECTING 12X1/4 (SUCTIONS) ×1 IMPLANT
WATER STERILE IRR 1000ML POUR (IV SOLUTION) ×1 IMPLANT
YANKAUER SUCT BULB TIP NO VENT (SUCTIONS) ×1 IMPLANT

## 2023-06-28 NOTE — Interval H&P Note (Signed)
 History and Physical Interval Note:  06/28/2023 1:21 PM  Marc Gilbert  has presented today for surgery, with the diagnosis of bilateral dermatochalasis.  The various methods of treatment have been discussed with the patient and family. After consideration of risks, benefits and other options for treatment, the patient has consented to  Procedure(s): BLEPHAROPLASTY (Bilateral) as a surgical intervention.  The patient's history has been reviewed, patient examined, no change in status, stable for surgery.  I have reviewed the patient's chart and labs.  Questions were answered to the patient's satisfaction.     Lindaann Requena [name]

## 2023-06-28 NOTE — Op Note (Signed)
 Operative Note  Date of operation: 06/28/2023  Patient: Marc Gilbert, MRN: [id number], 89 y.o. male.  Date of birth:  Sep 27, 1930  [facility]  Preoperative Diagnosis: Bilateral upper lid dermatochalasis  Postoperative Diagnosis: Same  Procedure: Bilateral Upper lid blepharoplasty with skin and fat excision  Surgeon: Gilles Lacks, DO  Assistant: Lamount Pimple, PA  Condition: Stable  Complications: None   EBL: Minimal  Disposition: Recovery Room  Indications: To prevent complications and improve visual field function.  Improve quality of life.  The patient presented with concerns regarding heavy upper eyelids with a decrease visual field.  This made it difficult with activities and reading.  It was better with manual elevation of the upper eyelids or extending the neck.  The clinical exam and tests confirmed the need for bilateral upper eyelid blepharoplasty.  Surgical risks and benefits were discussed in detail and are in the chart.  Procedure in detail: Patient was seen on the morning of the procedure after anesthesia evaluation. The patient was marked. All questions were answered.  The patient was then taken to the operating room.  A time out was called with all information confirmed to be correct.  The patient was prepped and draped with an opthalmologic betadine  prep. The markings were confirmed. Local anesthetic consisting of local with epinephrine  was injected into the upper lids. The local was given several minutes to take effect.  Right upper lid.  The redundant upper eyelid was measured with 20 mm of upper eyelid skin to remain.  A # 15 blade was used to incise the upper eyelid skin. Hemostasis was achieved with cautery.  The dissection was carried down past the orbicularis to expose the fat medially.  A small amount of fat was excised in the middle compartment of the upper lid. The incisions were closed with 6-0 Chromic as a running  subcuticular.  Left upper lid.  The redundant upper eyelid was measured with 20 mm of upper eyelid skin to remain.  A # 15 blade was used to incise the upper eyelid skin.  Hemostasis was achieved with bipolar cautery.  The dissection was carried down past the orbicularis to expose the fat medially.  A small amount of fat was excised in the middle compartment of the upper lid. The incisions were closed with 6-0 Chromic as a running subcuticular.  The incisions were washed.  Ice was applied and patient was allowed to wake up and was taken to the recovery room with no apparent complications. Family was notified at the end of the case.   The advanced practice practitioner (APP) assisted throughout the case.  The APP was essential in retraction and counter traction when needed to make the case progress smoothly.  This retraction and assistance made it possible to see the tissue plans for the procedure.  The assistance was needed for blood control, tissue re-approximation and assisted with closure of the incision site.

## 2023-06-28 NOTE — Discharge Instructions (Signed)
No heavy lifting or straining

## 2023-06-28 NOTE — Transfer of Care (Signed)
 Immediate Anesthesia Transfer of Care Note  Patient: Marc Gilbert  Procedure(s) Performed: BLEPHAROPLASTY (Bilateral: Eye)  Patient Location: PACU  Anesthesia Type:Regional  Level of Consciousness: awake, alert , and oriented  Airway & Oxygen  Therapy: Patient Spontanous Breathing and Patient connected to nasal cannula oxygen   Post-op Assessment: Report given to RN, Post -op Vital signs reviewed and stable, and Patient moving all extremities  Post vital signs: Reviewed and stable  Last Vitals:  Vitals Value Taken Time  BP 141/77 06/28/23 1458  Temp 98   Pulse 70 06/28/23 1500  Resp 17 06/28/23 1500  SpO2 96 % 06/28/23 1500  Vitals shown include unfiled device data.  Last Pain:  Vitals:   06/28/23 1137  TempSrc:   PainSc: 0-No pain         Complications: No notable events documented.

## 2023-06-29 ENCOUNTER — Encounter (HOSPITAL_COMMUNITY): Payer: Self-pay | Admitting: Plastic Surgery

## 2023-06-30 NOTE — Anesthesia Postprocedure Evaluation (Signed)
 Anesthesia Post Note  Patient: Marc Gilbert  Procedure(s) Performed: BLEPHAROPLASTY (Bilateral: Eye)     Patient location during evaluation: PACU Anesthesia Type: MAC Level of consciousness: awake and alert Pain management: pain level controlled Vital Signs Assessment: post-procedure vital signs reviewed and stable Respiratory status: spontaneous breathing, nonlabored ventilation, respiratory function stable and patient connected to nasal cannula oxygen  Cardiovascular status: stable and blood pressure returned to baseline Postop Assessment: no apparent nausea or vomiting Anesthetic complications: no   No notable events documented.  Last Vitals:  Vitals:   06/28/23 1500 06/28/23 1515  BP: (!) 141/77 136/69  Pulse: 68 61  Resp: 16 13  Temp: 36.7 C 36.7 C  SpO2: 97% 98%    Last Pain:  Vitals:   06/28/23 1515  TempSrc:   PainSc: 0-No pain                 Alexismarie Flaim S

## 2023-07-01 ENCOUNTER — Ambulatory Visit (INDEPENDENT_AMBULATORY_CARE_PROVIDER_SITE_OTHER): Admitting: Physical Medicine and Rehabilitation

## 2023-07-01 ENCOUNTER — Other Ambulatory Visit: Payer: Self-pay

## 2023-07-01 VITALS — BP 130/73 | HR 78

## 2023-07-01 DIAGNOSIS — M47816 Spondylosis without myelopathy or radiculopathy, lumbar region: Secondary | ICD-10-CM | POA: Diagnosis not present

## 2023-07-01 MED ORDER — METHYLPREDNISOLONE ACETATE 40 MG/ML IJ SUSP
40.0000 mg | Freq: Once | INTRAMUSCULAR | Status: AC
  Administered 2023-07-01: 40 mg

## 2023-07-01 NOTE — Progress Notes (Signed)
 Pain Scale   Average Pain 5 Patient advised his pain stays in lower back no radiating pain        +Driver, -BT, -Dye Allergies.

## 2023-07-05 ENCOUNTER — Encounter (HOSPITAL_BASED_OUTPATIENT_CLINIC_OR_DEPARTMENT_OTHER): Payer: Self-pay | Admitting: Pulmonary Disease

## 2023-07-05 ENCOUNTER — Ambulatory Visit (HOSPITAL_BASED_OUTPATIENT_CLINIC_OR_DEPARTMENT_OTHER): Admitting: Pulmonary Disease

## 2023-07-05 VITALS — BP 115/66 | HR 83 | Ht 72.0 in | Wt 188.8 lb

## 2023-07-05 DIAGNOSIS — J84112 Idiopathic pulmonary fibrosis: Secondary | ICD-10-CM | POA: Diagnosis not present

## 2023-07-05 DIAGNOSIS — J9611 Chronic respiratory failure with hypoxia: Secondary | ICD-10-CM

## 2023-07-05 NOTE — Patient Instructions (Addendum)
 X we will submit for insurance approval for Perfenidone  VISIT SUMMARY:  Today, we discussed your ongoing health issues, including idiopathic pulmonary fibrosis, chronic respiratory failure, and a thyroid  disorder. We reviewed your current treatments and made adjustments to better manage your conditions.  YOUR PLAN:  -IDIOPATHIC PULMONARY FIBROSIS (IPF): IPF is a lung disease that causes scarring of the lung tissue, making it difficult to breathe. Since you did not tolerate Ofev , we will start you on a new medication called pirfenidone. Begin with one pill three times a day, then increase to two pills three times a day, and finally three pills three times a day. Be aware of potential side effects like bloating, nausea, and sensitivity to sunlight. We will also monitor your liver function monthly for the first three months, then every three months if you tolerate the medication well.  -CHRONIC RESPIRATORY FAILURE: Chronic respiratory failure means your lungs are not getting enough oxygen  into your blood. You are managing this with supplemental oxygen  and maintaining oxygen  saturation around 92-94%. Continue to use your oxygen  concentrator as needed and conserve battery power during activities.

## 2023-07-05 NOTE — Progress Notes (Signed)
   Subjective:    Patient ID: Marc Gilbert, male    DOB: 07/25/1930, 89 y.o.   MRN: [id number]  HPI  89 yo for follow-up of IPF and chronic hypoxic respiratory failure  -2L at rest & upto 6L with exercise He smoked 3/4 pack/day until he quit in 1995, about 30 pack years     PMH : chronic diastolic heart failure,  paroxysmal atrial fibrillation, status post ablation in 08/10/2022 on Eliquis , history of coronary artery disease status post CABG,  chronic kidney see stage IIIa, hypothyroidism,     3 month FU visit  He did not tolerate Ofev  due to adverse effects and is interested in exploring alternative treatments for his lung disease. He is open to new medication options. He is currently on oxygen  therapy and conserves battery power for his oxygen  concentrator during activities. His estimated oxygen  saturation is around 92-93%. Dr. Orin Birk >> underwent bilateral blepharoplasty surgery    Significant tests/ events reviewed   02/2023 Initial OV On ambulation oxygen  saturation dropped on pulse O2 and required 6 L continuous to maintain about 88%   HRCT chest 01/2023 UIP pattern, progressive compared to 2020, chronic pancreatitis HRCT chest 03/2018 probable UIP pattern, no honeycombing   Echo 10/2022 RVSP 38, enlarged RV   PFTs 02/2023 severe restriction TLC 56%, FVC 75%, DLCO of 43%  Review of Systems neg for any significant sore throat, dysphagia, itching, sneezing, nasal congestion or excess/ purulent secretions, fever, chills, sweats, unintended wt loss, pleuritic or exertional cp, hempoptysis, orthopnea pnd or change in chronic leg swelling. Also denies presyncope, palpitations, heartburn, abdominal pain, nausea, vomiting, diarrhea or change in bowel or urinary habits, dysuria,hematuria, rash, arthralgias, visual complaints, headache, numbness weakness or ataxia.     Objective:   Physical Exam  Gen. Pleasant, on [place],  in no distress ENT - no lesions, no post nasal drip Neck:  No JVD, no thyromegaly, no carotid bruits Lungs: no use of accessory muscles, no dullness to percussion, BB dry rales no rhonchi  Cardiovascular: Rhythm regular, heart sounds  normal, no murmurs or gallops, no peripheral edema Musculoskeletal: No deformities, no cyanosis or clubbing , no tremors       Assessment & Plan:   Idiopathic Pulmonary Fibrosis (IPF) IPF with intolerance to Ofev . He is keen to try alternative medication to slow disease progression. Pirfenidone is proposed as an alternative treatment. It is to be taken three times a day and may cause bloating, nausea, and photosensitivity. Liver function monitoring is necessary due to potential hepatotoxicity. - Initiate treatment with pirfenidone, starting at one pill three times a day, then increase to two pills three times a day, and finally three pills three times a day. - Monitor for side effects such as bloating, nausea, and photosensitivity. - Conduct liver function tests monthly for the first three months, then every three months if tolerated.  Chronic Respiratory Failure Chronic respiratory failure managed with supplemental oxygen . He reports maintaining oxygen  saturation around 92-94% and conserves battery power during activities.

## 2023-07-06 ENCOUNTER — Ambulatory Visit (INDEPENDENT_AMBULATORY_CARE_PROVIDER_SITE_OTHER): Admitting: Student

## 2023-07-06 DIAGNOSIS — H02834 Dermatochalasis of left upper eyelid: Secondary | ICD-10-CM

## 2023-07-06 DIAGNOSIS — H02831 Dermatochalasis of right upper eyelid: Secondary | ICD-10-CM

## 2023-07-06 NOTE — Progress Notes (Signed)
 Patient is a 89 year old male who recently underwent bilateral upper lid blepharoplasty with skin and fat excision with Dr. Orin Birk on 06/28/2023.  He is about 1 week out from his procedure.  He presents to the clinic today for postoperative follow-up.  Today, patient reports he is doing well.  He states he has not had any pain.  He does report that some of the sutures irritate him a little bit.  He denies any fevers or chills.  Denies any issues with closing his eyes.  He states that he is pleased with his results.  On exam, patient is sitting upright in no acute distress.  Incisions are intact and healing well.  There is no surrounding erythema.  No drainage on exam.  Patient is able to close his eyes.  There is a little bit of bruising, especially noted underneath his eyes.  No fluctuance noted on exam.  No signs of infection.  Recommended that we wait until next week to remove sutures.  Discussed with him he could put a small amount of Vaseline around the suture as this may help with some of the irritation.  Patient expressed understanding.  We will have the patient follow back up next week.  I instructed him to call if he has any questions or concerns about anything.

## 2023-07-07 ENCOUNTER — Telehealth: Payer: Self-pay

## 2023-07-07 DIAGNOSIS — Z5181 Encounter for therapeutic drug level monitoring: Secondary | ICD-10-CM

## 2023-07-07 DIAGNOSIS — J84112 Idiopathic pulmonary fibrosis: Secondary | ICD-10-CM

## 2023-07-07 NOTE — Telephone Encounter (Signed)
 Received new start paperwork for pirfenidone via Onbase  Submitted a Prior Authorization request to [facility] for PIRFENIDONE via CoverMyMeds. Will update once we receive a response.  Key: [id number]

## 2023-07-13 ENCOUNTER — Ambulatory Visit (INDEPENDENT_AMBULATORY_CARE_PROVIDER_SITE_OTHER): Admitting: Student

## 2023-07-13 VITALS — BP 136/70 | HR 72

## 2023-07-13 DIAGNOSIS — H02834 Dermatochalasis of left upper eyelid: Secondary | ICD-10-CM

## 2023-07-13 DIAGNOSIS — H02831 Dermatochalasis of right upper eyelid: Secondary | ICD-10-CM

## 2023-07-13 NOTE — Progress Notes (Signed)
 Patient is a 89 year old male who recently underwent bilateral upper lid blepharoplasty with skin and fat excision with Dr. Orin Birk on 06/28/2023.  He is about 2 weeks out from his procedure.  He presents to the clinic today for postoperative follow-up.     Patient was last seen in the clinic on 07/06/2023.  At this visit, patient was doing well.  On exam, incisions were intact and healing well.  There was a little bit of bruising noted.  Today, patient reports he is doing well.  He states that sometimes his eyes are little bit dry at night, but reports that he puts eyedrops in which help.  He denies any other issues or concerns at this time.  He denies any fevers, chills or drainage.  On exam, patient is sitting upright in no acute distress.  Incisions to both of the upper eyelids are intact.  There are chromic sutures noted to both of the incisions.  These were removed without any difficulty.  Patient tolerated well.  Patient is able to close his eyes without any difficulty.  There are no signs of infection on exam.  Bruising from previous exam appears to have improved significantly.  Discussed with patient that he may put a small amount of Vaseline on his incision once daily.  Discussed with him that after about a week or so, he can start using Mederma or a scar cream.  Patient expressed understanding.  Patient to follow back up in 2 to 3 weeks.  I instructed him to call if he has any questions or concerns in the meantime.  Pictures were obtained of the patient and placed in the chart with the patient's or guardian's permission.

## 2023-07-14 NOTE — Procedures (Signed)
 Lumbar Facet Joint Intra-Articular Injection(s) with Fluoroscopic Guidance  Patient: Marc Gilbert      Date of Birth: March 30, 1930 MRN: [id number] PCP: Annabell Key, [place]  E, PA      Visit Date: 07/01/2023   Universal Protocol:    Date/Time: 07/01/2023  Consent Given By: the patient  Position: PRONE   Additional Comments: Vital signs were monitored before and after the procedure. Patient was prepped and draped in the usual sterile fashion. The correct patient, procedure, and site was verified.   Injection Procedure Details:  Procedure Site One Meds Administered:  Meds ordered this encounter  Medications   methylPREDNISolone  acetate (DEPO-MEDROL ) injection 40 mg     Laterality: Bilateral  Location/Site:  L5-S1  Needle size: 22 guage  Needle type: Spinal  Needle Placement: Articular  Findings:  -Comments: Excellent flow of contrast producing a partial arthrogram.  Procedure Details: The fluoroscope beam is vertically oriented in AP, and the inferior recess is visualized beneath the lower pole of the inferior apophyseal process, which represents the target point for needle insertion. When direct visualization is difficult the target point is located at the medial projection of the vertebral pedicle. The region overlying each aforementioned target is locally anesthetized with a 1 to 2 ml. volume of 1% Lidocaine  without Epinephrine .   The spinal needle was inserted into each of the above mentioned facet joints using biplanar fluoroscopic guidance. A 0.25 to 0.5 ml. volume of Isovue -250 was injected and a partial facet joint arthrogram was obtained. A single spot film was obtained of the resulting arthrogram.    One to 1.25 ml of the steroid/anesthetic solution was then injected into each of the facet joints noted above.   Additional Comments:  The patient tolerated the procedure well Dressing: 2 x 2 sterile gauze and Band-Aid    Post-procedure details: Patient was  observed during the procedure. Post-procedure instructions were reviewed.  Patient left the clinic in stable condition.

## 2023-07-14 NOTE — Progress Notes (Signed)
 Marc Gilbert - 89 y.o. male MRN [id number]  Date of birth: October 28, 1930  Office Visit Note: Visit Date: 07/01/2023 PCP: Annabell Key, [place]  E, PA Referred by: Annabell Key, [place]  E, PA  Subjective: Chief Complaint  Patient presents with   Lower Back - Pain   HPI:  Marc Gilbert is a 89 y.o. male who comes in today at the request of Elvan Hamel, FNP for planned Bilateral  L5-S1 Lumbar facet/medial branch block with fluoroscopic guidance.  The patient has failed conservative care including home exercise, medications, time and activity modification.  This injection will be diagnostic and hopefully therapeutic.  Please see requesting physician notes for further details and justification.  Exam has shown concordant pain with facet joint loading.   ROS Otherwise per HPI.  Assessment & Plan: Visit Diagnoses:    ICD-10-CM   1. Spondylosis without myelopathy or radiculopathy, lumbar region  M47.816 XR C-ARM NO REPORT    Facet Injection    methylPREDNISolone  acetate (DEPO-MEDROL ) injection 40 mg      Plan: No additional findings.   Meds & Orders:  Meds ordered this encounter  Medications   methylPREDNISolone  acetate (DEPO-MEDROL ) injection 40 mg    Orders Placed This Encounter  Procedures   Facet Injection   XR C-ARM NO REPORT    Follow-up: Return for visit to requesting provider as needed.   Procedures: No procedures performed  Lumbar Facet Joint Intra-Articular Injection(s) with Fluoroscopic Guidance  Patient: Marc Gilbert      Date of Birth: September 23, 1930 MRN: [id number] PCP: Alida Ion, PA      Visit Date: 07/01/2023   Universal Protocol:    Date/Time: 07/01/2023  Consent Given By: the patient  Position: PRONE   Additional Comments: Vital signs were monitored before and after the procedure. Patient was prepped and draped in the usual sterile fashion. The correct patient, procedure, and site was verified.   Injection Procedure Details:  Procedure Site  One Meds Administered:  Meds ordered this encounter  Medications   methylPREDNISolone  acetate (DEPO-MEDROL ) injection 40 mg     Laterality: Bilateral  Location/Site:  L5-S1  Needle size: 22 guage  Needle type: Spinal  Needle Placement: Articular  Findings:  -Comments: Excellent flow of contrast producing a partial arthrogram.  Procedure Details: The fluoroscope beam is vertically oriented in AP, and the inferior recess is visualized beneath the lower pole of the inferior apophyseal process, which represents the target point for needle insertion. When direct visualization is difficult the target point is located at the medial projection of the vertebral pedicle. The region overlying each aforementioned target is locally anesthetized with a 1 to 2 ml. volume of 1% Lidocaine  without Epinephrine .   The spinal needle was inserted into each of the above mentioned facet joints using biplanar fluoroscopic guidance. A 0.25 to 0.5 ml. volume of Isovue -250 was injected and a partial facet joint arthrogram was obtained. A single spot film was obtained of the resulting arthrogram.    One to 1.25 ml of the steroid/anesthetic solution was then injected into each of the facet joints noted above.   Additional Comments:  The patient tolerated the procedure well Dressing: 2 x 2 sterile gauze and Band-Aid    Post-procedure details: Patient was observed during the procedure. Post-procedure instructions were reviewed.  Patient left the clinic in stable condition.    Clinical History: MRI LUMBAR SPINE WITHOUT CONTRAST   TECHNIQUE: Multiplanar, multisequence MR imaging of the lumbar spine was performed. No intravenous contrast was administered.  COMPARISON:  10/29/2005   FINDINGS: Segmentation:  Standard   Alignment: Grade 1 retrolisthesis at L2-3 and L3-4. Grade 1 anterolisthesis at L5-S1. S shaped scoliosis.   Vertebrae:  No fracture, evidence of discitis, or bone lesion.   Conus  medullaris and cauda equina: Conus extends to the L1 level. Conus and cauda equina appear normal.   Paraspinal and other soft tissues: Fatty atrophy of the paraspinous muscles   Disc levels:   T12-L1: Small disc bulge without stenosis.   L1-L2: Progression of disc bulge with endplate spurring. No spinal canal stenosis. Mild left neural foraminal stenosis.   L2-L3: Small disc bulge, unchanged. No spinal canal stenosis. Mild left neural foraminal stenosis.   L3-L4: Progression of disc degeneration with small bulge and endplate spurring. Unchanged left lateral recess narrowing without central spinal canal stenosis. Progression of moderate left neural foraminal stenosis.   L4-L5: Small right asymmetric disc bulge, slightly worsened. No spinal canal stenosis. Unchanged mild bilateral neural foraminal stenosis.   L5-S1: Progression of severe facet arthrosis with increased anterolisthesis. Regression of right subarticular disc herniation. Narrowing of the lateral recesses without central spinal canal stenosis. Unchanged moderate bilateral neural foraminal stenosis.   Visualized sacrum: Normal.   IMPRESSION: 1. Progression of severe L5-S1 facet arthrosis with worsened anterolisthesis and unchanged moderate bilateral foraminal stenosis. 2. Progression of degenerative disc disease at L3-4 with worsened moderate left foraminal stenosis.     Electronically Signed   By: Juanetta Nordmann M.D.   On: 06/16/2021 21:10     Objective:  VS:  HT:    WT:   BMI:     BP:130/73  HR:78bpm  TEMP: ( )  RESP:  Physical Exam Vitals and nursing note reviewed.  Constitutional:      General: He is not in acute distress.    Appearance: Normal appearance. He is not ill-appearing.  HENT:     Head: Normocephalic and atraumatic.     Right Ear: External ear normal.     Left Ear: External ear normal.     Nose: No congestion.  Eyes:     Extraocular Movements: Extraocular movements intact.   Cardiovascular:     Rate and Rhythm: Normal rate.     Pulses: Normal pulses.  Pulmonary:     Effort: Pulmonary effort is normal. No respiratory distress.  Abdominal:     General: There is no distension.     Palpations: Abdomen is soft.  Musculoskeletal:        General: No tenderness or signs of injury.     Cervical back: Neck supple.     Right lower leg: No edema.     Left lower leg: No edema.     Comments: Patient has good distal strength without clonus.  Skin:    Findings: No erythema or rash.  Neurological:     General: No focal deficit present.     Mental Status: He is alert and oriented to person, place, and time.     Sensory: No sensory deficit.     Motor: No weakness or abnormal muscle tone.     Coordination: Coordination normal.  Psychiatric:        Mood and Affect: Mood normal.        Behavior: Behavior normal.      Imaging: No results found.

## 2023-07-19 ENCOUNTER — Other Ambulatory Visit (HOSPITAL_COMMUNITY): Payer: Self-pay

## 2023-07-19 MED ORDER — PIRFENIDONE 267 MG PO TABS: ORAL_TABLET | ORAL | 0 refills | Status: DC

## 2023-07-19 MED ORDER — PIRFENIDONE 267 MG PO TABS: 801.0000 mg | ORAL_TABLET | Freq: Three times a day (TID) | ORAL | 3 refills | Status: DC

## 2023-07-19 NOTE — Telephone Encounter (Signed)
 Received notification from [facility] regarding a prior authorization for PIRFENIDONE. Authorization has been APPROVED from 07/07/2023 until further notice.Approval letter sent to scan center.  Per test claim, copay for 30 days supply is $0  Patient can fill through [facility] (pulmonary fibrosis team). [phone]  Authorization # [id number]  LFTs on 06/22/23 wnl  Rx sent to [facility]. Future order for hepatic function panel placed. MyChart message sent to patient and daughter regarding rx  Geraldene Kleine, PharmD, MPH, BCPS, CPP Clinical Pharmacist (Rheumatology and Pulmonology)

## 2023-07-19 NOTE — Telephone Encounter (Signed)
 Esbriet refill

## 2023-07-20 ENCOUNTER — Encounter: Admitting: Plastic Surgery

## 2023-07-22 DIAGNOSIS — E039 Hypothyroidism, unspecified: Secondary | ICD-10-CM | POA: Diagnosis not present

## 2023-08-02 ENCOUNTER — Other Ambulatory Visit: Payer: Self-pay | Admitting: Pharmacist

## 2023-08-02 NOTE — Progress Notes (Signed)
 Patient started pirfenidone .  Will monitor LFT's every month for the first 6 months of treatment then every 3 months.  Patient would like labs drawn at [facility] (standing orders for Lfts faxed today, [phone])      Geraldene Kleine, PharmD, MPH, BCPS, CPP Clinical Pharmacist (Rheumatology and Pulmonology)

## 2023-08-02 NOTE — Telephone Encounter (Signed)
 Will monitor LFT's every month for the first 6 months of treatment then every 3 months.  Patient would like labs drawn at [facility] (standing orders for Lfts faxed today, [phone])

## 2023-08-03 ENCOUNTER — Ambulatory Visit: Admitting: Student

## 2023-08-03 ENCOUNTER — Encounter: Admitting: Student

## 2023-08-03 VITALS — BP 104/64 | HR 92

## 2023-08-03 DIAGNOSIS — H02834 Dermatochalasis of left upper eyelid: Secondary | ICD-10-CM

## 2023-08-03 DIAGNOSIS — H02831 Dermatochalasis of right upper eyelid: Secondary | ICD-10-CM

## 2023-08-03 NOTE — Progress Notes (Signed)
 Patient is a 89 year old male who recently underwent bilateral upper lid blepharoplasty with skin and fat excision with Dr. Orin Birk on 06/28/2023.  Patient is 5 weeks out from his procedure.  He presents to the clinic today for postoperative follow-up.  Patient was last seen in the clinic on 07/13/2023.  At this visit, patient was doing well.  On exam, incisions to both of the upper eyelids were intact.  Sutures were removed without any difficulty.  There were no signs of infection on exam.  Today, patient reports he is doing well.  He does not report any new issues or concerns.  He denies any drainage.  He denies any fevers or chills.  He reports he is still able to close his eyes without any difficulty.  Patient also states that he has a small area of irritation on his right cheek from his oxygen  tubing.  On exam, patient is sitting upright in no acute distress.  Swelling and bruising around the eyes has appeared to almost completely resolved.  Incisions appear to be intact and healing well.  There was a small suture noted to the left upper eyelid incision which was removed without any difficulty.  Patient tolerated well.  There are no signs of infection on exam.  Patient is able to close his eyes completely.  To the right cheek, there was a small blister with some irritation around it.  It did not appear to be cellulitic or infected.  Discussed with the patient that he may transition to scar creams to his incisions if he would like to at this time.  We discussed silicone-based scar creams.  He expressed understanding.  Recommended that patient keep the blister covered with something like a Mepilex border dressing to prevent the oxygen  tubing from rubbing against his blister.  Also discussed with him he can put a little bit of Vaseline on it.  I discussed with him that if this persists, he needs to follow-up with his primary care doctor.  Patient expressed understanding.  Patient is to follow-up as  needed.  I instructed him to call if he has any questions or concerns about anything.  Pictures were obtained of the patient and placed in the chart with the patient's or guardian's permission.

## 2023-08-05 ENCOUNTER — Telehealth (HOSPITAL_BASED_OUTPATIENT_CLINIC_OR_DEPARTMENT_OTHER): Payer: Self-pay

## 2023-08-05 NOTE — Telephone Encounter (Signed)
 duplicte

## 2023-08-10 DIAGNOSIS — B351 Tinea unguium: Secondary | ICD-10-CM | POA: Diagnosis not present

## 2023-08-10 DIAGNOSIS — M79675 Pain in left toe(s): Secondary | ICD-10-CM | POA: Diagnosis not present

## 2023-08-10 DIAGNOSIS — M79674 Pain in right toe(s): Secondary | ICD-10-CM | POA: Diagnosis not present

## 2023-08-13 ENCOUNTER — Other Ambulatory Visit: Payer: Self-pay | Admitting: Cardiology

## 2023-08-13 NOTE — Telephone Encounter (Signed)
 Prescription refill request for Eliquis  received. Indication:afib Last office visit:3/25 Scr:1.07  5/25 Age: 89 Weight:85.6  kg  Prescription refilled

## 2023-08-26 DIAGNOSIS — J849 Interstitial pulmonary disease, unspecified: Secondary | ICD-10-CM | POA: Diagnosis not present

## 2023-08-30 ENCOUNTER — Telehealth: Payer: Self-pay | Admitting: Pharmacist

## 2023-08-30 ENCOUNTER — Encounter: Payer: Self-pay | Admitting: Pharmacist

## 2023-08-30 NOTE — Telephone Encounter (Signed)
 Received labs from [facility].  LFTs drawn on 08/26/2023 wnl  Continue pirfenidone  as prescribed  MyChart message snet to patient  Sherry Pennant, PharmD, MPH, BCPS, CPP Clinical Pharmacist (Rheumatology and Pulmonology)

## 2023-08-30 NOTE — Telephone Encounter (Signed)
 ERROR

## 2023-09-15 DIAGNOSIS — D485 Neoplasm of uncertain behavior of skin: Secondary | ICD-10-CM | POA: Diagnosis not present

## 2023-09-15 DIAGNOSIS — Z86018 Personal history of other benign neoplasm: Secondary | ICD-10-CM | POA: Diagnosis not present

## 2023-09-15 DIAGNOSIS — D225 Melanocytic nevi of trunk: Secondary | ICD-10-CM | POA: Diagnosis not present

## 2023-09-15 DIAGNOSIS — L814 Other melanin hyperpigmentation: Secondary | ICD-10-CM | POA: Diagnosis not present

## 2023-09-15 DIAGNOSIS — L739 Follicular disorder, unspecified: Secondary | ICD-10-CM | POA: Diagnosis not present

## 2023-09-15 DIAGNOSIS — Z85828 Personal history of other malignant neoplasm of skin: Secondary | ICD-10-CM | POA: Diagnosis not present

## 2023-09-15 DIAGNOSIS — L570 Actinic keratosis: Secondary | ICD-10-CM | POA: Diagnosis not present

## 2023-09-15 DIAGNOSIS — L821 Other seborrheic keratosis: Secondary | ICD-10-CM | POA: Diagnosis not present

## 2023-09-15 DIAGNOSIS — L820 Inflamed seborrheic keratosis: Secondary | ICD-10-CM | POA: Diagnosis not present

## 2023-09-15 DIAGNOSIS — L578 Other skin changes due to chronic exposure to nonionizing radiation: Secondary | ICD-10-CM | POA: Diagnosis not present

## 2023-09-20 NOTE — Telephone Encounter (Signed)
 Encounter created in error

## 2023-09-23 DIAGNOSIS — Z5181 Encounter for therapeutic drug level monitoring: Secondary | ICD-10-CM | POA: Diagnosis not present

## 2023-09-23 DIAGNOSIS — J84112 Idiopathic pulmonary fibrosis: Secondary | ICD-10-CM | POA: Diagnosis not present

## 2023-10-05 ENCOUNTER — Ambulatory Visit (HOSPITAL_BASED_OUTPATIENT_CLINIC_OR_DEPARTMENT_OTHER): Admitting: Adult Health

## 2023-10-12 DIAGNOSIS — M79674 Pain in right toe(s): Secondary | ICD-10-CM | POA: Diagnosis not present

## 2023-10-12 DIAGNOSIS — B351 Tinea unguium: Secondary | ICD-10-CM | POA: Diagnosis not present

## 2023-10-12 DIAGNOSIS — M79675 Pain in left toe(s): Secondary | ICD-10-CM | POA: Diagnosis not present

## 2023-10-20 ENCOUNTER — Encounter: Payer: Self-pay | Admitting: Pulmonary Disease

## 2023-10-21 ENCOUNTER — Ambulatory Visit (INDEPENDENT_AMBULATORY_CARE_PROVIDER_SITE_OTHER): Admitting: Pulmonary Disease

## 2023-10-21 ENCOUNTER — Encounter (HOSPITAL_BASED_OUTPATIENT_CLINIC_OR_DEPARTMENT_OTHER): Payer: Self-pay | Admitting: Pulmonary Disease

## 2023-10-21 VITALS — BP 118/73 | HR 86 | Ht 73.0 in | Wt 184.0 lb

## 2023-10-21 DIAGNOSIS — J84112 Idiopathic pulmonary fibrosis: Secondary | ICD-10-CM | POA: Diagnosis not present

## 2023-10-21 DIAGNOSIS — J849 Interstitial pulmonary disease, unspecified: Secondary | ICD-10-CM | POA: Diagnosis not present

## 2023-10-21 DIAGNOSIS — Z5181 Encounter for therapeutic drug level monitoring: Secondary | ICD-10-CM | POA: Diagnosis not present

## 2023-10-21 NOTE — Patient Instructions (Signed)
 X LFTs in 3 months

## 2023-10-21 NOTE — Progress Notes (Signed)
 Subjective:    Patient ID: Marc Gilbert, male    DOB: 20-Jul-1930, 89 y.o.   MRN: [id number]  89 yo for follow-up of IPF and chronic hypoxic respiratory failure  -2L at rest & upto 6L with exercise He smoked 3/4 pack/day until he quit in 1995, about 30 pack years -did not tolerate Ofev       PMH : chronic diastolic heart failure,  paroxysmal atrial fibrillation, status post ablation in 08/10/2022 on Eliquis , history of coronary artery disease status post CABG,  chronic kidney see stage IIIa, hypothyroidism   Discussed the use of AI scribe software for clinical note transcription with the patient, who gave verbal consent to proceed.  History of Present Illness   Discussed the use of AI scribe software for clinical note transcription with the patient, who gave verbal consent to proceed.  History of Present Illness   Marc Gilbert is a 89 year old male with idiopathic pulmonary fibrosis and chronic respiratory failure with hypoxia who presents for follow-up.  He has been on pirfenidone  for several months without significant adverse reactions, except for a tingling sensation when exposed to sunlight, likely due to photosensitivity.  His oxygen  saturation drops to 88% or lower when walking approximately 100 yards, improving to 96% upon sitting. He uses a portable oxygen  concentrator set at six liters during ambulation and is satisfied with its performance.  He participates in exercise classes four times a week and uses a crank machine in the gym, though he finds it challenging to reach the exertion level of pulmonary rehab. He lives in a retirement community and actively maintains his physical health.          Significant tests/ events reviewed   02/2023 Initial OV On ambulation oxygen  saturation dropped on pulse O2 and required 6 L continuous to maintain about 88%   HRCT chest 01/2023 UIP pattern, progressive compared to 2020, chronic pancreatitis HRCT chest 03/2018 probable UIP  pattern, no honeycombing   Echo 10/2022 RVSP 38, enlarged RV   PFTs 02/2023 severe restriction TLC 56%, FVC 75%, DLCO of 43%  Review of Systems  neg for any significant sore throat, dysphagia, itching, sneezing, nasal congestion or excess/ purulent secretions, fever, chills, sweats, unintended wt loss, pleuritic or exertional cp, hempoptysis, orthopnea pnd or change in chronic leg swelling. Also denies presyncope, palpitations, heartburn, abdominal pain, nausea, vomiting, diarrhea or change in bowel or urinary habits, dysuria,hematuria, rash, arthralgias, visual complaints, headache, numbness weakness or ataxia.      Objective:   Physical Exam  Gen. Pleasant, well-nourished, in no distress, elderly, on O2 ENT - no thrush, no pallor/icterus,no post nasal drip Neck: No JVD, no thyromegaly, no carotid bruits Lungs: no use of accessory muscles, no dullness to percussion, clear without rales or rhonchi  Cardiovascular: Rhythm regular, heart sounds  normal, no murmurs or gallops, no peripheral edema Musculoskeletal: No deformities, no cyanosis or clubbing        Assessment & Plan:   Assessment and Plan Assessment & Plan   Assessment and Plan    Idiopathic pulmonary fibrosis with chronic respiratory failure and hypoxia He has idiopathic pulmonary fibrosis with chronic respiratory failure and hypoxia. Currently on pirfenidone , he reports no significant adverse reactions, though he experiences tingling due to photosensitivity. Oxygen  saturation drops to 88% with exertion, recovering to 96% at rest. He uses six liters of oxygen  during ambulation and participates in regular exercise, finding it beneficial. He is satisfied with his management and handles exercise and  oxygen  needs well. Pirfenidone  aims to slow fibrosis progression, and he tolerates it better than previous medication. - Continue pirfenidone  therapy. - Advise on sun protection measures due to photosensitivity (e.g., wearing long  sleeves, using sunscreen). - Monitor liver function tests (LFTs) every three months. - Continue using six liters of oxygen  during ambulation. - Encourage participation in regular exercise classes and use of cardio equipment.  Follow-Up Scheduled for follow-up in December, including condition monitoring and liver function tests. - Schedule follow-up appointment in December. - Ensure liver function tests are conducted every three months.

## 2023-10-22 ENCOUNTER — Encounter: Payer: Self-pay | Admitting: Pulmonary Disease

## 2023-11-07 NOTE — Progress Notes (Unsigned)
  Cardiology Office Note:   Date:  11/09/2023  ID:  Marc Gilbert, DOB 11-Jul-1930, MRN [id number] PCP: Cleotilde Dorice BRAVO, PA  [facility] Providers Cardiologist:  Lynwood Schilling, MD Electrophysiologist:  Eulas BRAVO [place], MD {  History of Present Illness:   Marc Gilbert is a 89 y.o. male who presents for follow up of CAD/CABG.    He had CABG 08/14/2016 w/ LIMA-LAD and SVG-OM.  He was recently found to have atrial flutter.  He had ablation .  Since I saw him he has done well.  He lives at [facility].  He participates in the exercise classes. The patient denies any new symptoms such as chest discomfort, neck or arm discomfort. There has been no new shortness of breath, PND or orthopnea. There have been no reported palpitations, presyncope or syncope.   ROS: As stated in the HPI and negative for all other systems.  Studies Reviewed:    EKG:     NA  Risk Assessment/Calculations:    CHA2DS2-VASc Score = 5   This indicates a 7.2% annual risk of stroke. The patient's score is based upon: CHF History: 1 HTN History: 1 Diabetes History: 0 Stroke History: 0 Vascular Disease History: 1 Age Score: 2 Gender Score: 0    Physical Exam:   VS:  BP 106/60   Pulse 75   Ht 6' (1.829 m)   Wt 188 lb 4.8 oz (85.4 kg)   SpO2 98%   BMI 25.54 kg/m    Wt Readings from Last 3 Encounters:  11/09/23 188 lb 4.8 oz (85.4 kg)  10/21/23 184 lb (83.5 kg)  07/05/23 188 lb 12.8 oz (85.6 kg)     GEN: Well nourished, well developed in no acute distress NECK: No JVD; No carotid bruits CARDIAC: RRR, no murmurs, rubs, gallops RESPIRATORY:  Clear to auscultation without rales, wheezing or rhonchi  ABDOMEN: Soft, non-tender, non-distended EXTREMITIES:  No edema; No deformity   ASSESSMENT AND PLAN:   CAD:  The patient has no new sypmtoms.  No further cardiovascular testing is indicated.  We will continue with aggressive risk reduction and meds as listed with the exception of starting aspirin   below  HTN: The blood pressure is at target.  No change in therapy.   DYSLIPIDEMIA:    LDL was 62.  No change in therapy.     ATRIAL FLUTTER: He seems to be maintaining sinus rhythm after flutter ablation.  I think it is reasonable to take him off of the Eliquis  and just start aspirin  81 mg.  He is wearing an Apple watch to monitor.  He has had no recurrence.  No change in therapy.    EDEMA: This is resolved.  No change in therapy.   HFmrEF: He seems to be euvolemic.  No change in therapy.   CKD:   Creatinine was 1.07.  No change in therapy.   PULM HTN: He is having this managed conservatively in the context of treating his chronic hypoxemia and ILD and is followed closely by pulmonary.   Follow up with me in 12 months.  Signed, Lynwood Schilling, MD

## 2023-11-09 ENCOUNTER — Encounter: Payer: Self-pay | Admitting: Cardiology

## 2023-11-09 ENCOUNTER — Ambulatory Visit: Attending: Cardiology | Admitting: Cardiology

## 2023-11-09 VITALS — BP 106/60 | HR 75 | Ht 72.0 in | Wt 188.3 lb

## 2023-11-09 DIAGNOSIS — I10 Essential (primary) hypertension: Secondary | ICD-10-CM | POA: Insufficient documentation

## 2023-11-09 DIAGNOSIS — E785 Hyperlipidemia, unspecified: Secondary | ICD-10-CM | POA: Diagnosis not present

## 2023-11-09 DIAGNOSIS — I4892 Unspecified atrial flutter: Secondary | ICD-10-CM | POA: Diagnosis not present

## 2023-11-09 DIAGNOSIS — I25118 Atherosclerotic heart disease of native coronary artery with other forms of angina pectoris: Secondary | ICD-10-CM | POA: Insufficient documentation

## 2023-11-09 MED ORDER — ASPIRIN 81 MG PO TBEC: 81.0000 mg | DELAYED_RELEASE_TABLET | Freq: Every day | ORAL | Status: AC

## 2023-11-09 NOTE — Patient Instructions (Signed)
 Medication Instructions:  Stop Eliquis  Start Aspirin  81 mg once daily (over the counter) *If you need a refill on your cardiac medications before your next appointment, please call your pharmacy*  Lab Work: NONe If you have labs (blood work) drawn today and your tests are completely normal, you will receive your results only by: MyChart Message (if you have MyChart) OR A paper copy in the mail If you have any lab test that is abnormal or we need to change your treatment, we will call you to review the results.  Testing/Procedures: NONE  Follow-Up: At [facility], you and your health needs are our priority.  As part of our continuing mission to provide you with exceptional heart care, our providers are all part of one team.  This team includes your primary Cardiologist (physician) and Advanced Practice Providers or APPs (Physician Assistants and Nurse Practitioners) who all work together to provide you with the care you need, when you need it.  Your next appointment:   1 year  Provider:   [place], MD  We recommend signing up for the patient portal called MyChart.  Sign up information is provided on this After Visit Summary.  MyChart is used to connect with patients for Virtual Visits (Telemedicine).  Patients are able to view lab/test results, encounter notes, upcoming appointments, etc.  Non-urgent messages can be sent to your provider as well.   To learn more about what you can do with MyChart, go to [email or web address].

## 2023-11-18 DIAGNOSIS — Z23 Encounter for immunization: Secondary | ICD-10-CM | POA: Diagnosis not present

## 2023-11-22 ENCOUNTER — Telehealth: Payer: Self-pay

## 2023-11-22 DIAGNOSIS — J84112 Idiopathic pulmonary fibrosis: Secondary | ICD-10-CM

## 2023-11-23 MED ORDER — PIRFENIDONE 267 MG PO TABS: 801.0000 mg | ORAL_TABLET | Freq: Three times a day (TID) | ORAL | 1 refills | Status: DC

## 2023-11-23 NOTE — Telephone Encounter (Signed)
 Refill sent for PIRFENIDONE  to [facility] (pulmonary fibrosis team). [phone]  Dose: 801mg  by mouth three times daily   Last OV: 10/21/23 Provider: Dr. Jude Pertinent labs: LFTs collected 10/21/23 wnl  Next OV: 01/25/24 with APP  Per previous documentation, patient started pirfenidone  in June 2026 with plan to recheck liver function labs monthly x 6 months.   Next LFTs due in now (October). Standing orders were faxed to [facility] on 08/02/23 (see telephone note 05/24/23).   Called [facility] to ensure they have standing orders and are able to fax results to our office. Staff member reports they have the lab orders, but patient declined labs last week. Will message patient.  Marc Gilbert, PharmD, BCPS Clinical Pharmacist  [facility]

## 2023-11-25 DIAGNOSIS — J84112 Idiopathic pulmonary fibrosis: Secondary | ICD-10-CM | POA: Diagnosis not present

## 2023-11-25 DIAGNOSIS — Z5181 Encounter for therapeutic drug level monitoring: Secondary | ICD-10-CM | POA: Diagnosis not present

## 2023-11-28 ENCOUNTER — Ambulatory Visit: Payer: Self-pay | Admitting: Cardiology

## 2023-11-30 DIAGNOSIS — C4442 Squamous cell carcinoma of skin of scalp and neck: Secondary | ICD-10-CM | POA: Diagnosis not present

## 2023-11-30 DIAGNOSIS — D485 Neoplasm of uncertain behavior of skin: Secondary | ICD-10-CM | POA: Diagnosis not present

## 2023-12-16 DIAGNOSIS — R3915 Urgency of urination: Secondary | ICD-10-CM | POA: Diagnosis not present

## 2023-12-16 DIAGNOSIS — N401 Enlarged prostate with lower urinary tract symptoms: Secondary | ICD-10-CM | POA: Diagnosis not present

## 2023-12-16 DIAGNOSIS — C61 Malignant neoplasm of prostate: Secondary | ICD-10-CM | POA: Diagnosis not present

## 2023-12-16 DIAGNOSIS — E291 Testicular hypofunction: Secondary | ICD-10-CM | POA: Diagnosis not present

## 2023-12-20 ENCOUNTER — Encounter (HOSPITAL_BASED_OUTPATIENT_CLINIC_OR_DEPARTMENT_OTHER): Payer: Self-pay | Admitting: Pulmonary Disease

## 2023-12-20 ENCOUNTER — Encounter: Payer: Self-pay | Admitting: Radiology

## 2023-12-20 NOTE — Telephone Encounter (Signed)
 Can we add him to cancel list for before 01/21/2024, I've offered with [facility] already

## 2023-12-21 DIAGNOSIS — B351 Tinea unguium: Secondary | ICD-10-CM | POA: Diagnosis not present

## 2023-12-21 DIAGNOSIS — M79674 Pain in right toe(s): Secondary | ICD-10-CM | POA: Diagnosis not present

## 2023-12-21 DIAGNOSIS — M79675 Pain in left toe(s): Secondary | ICD-10-CM | POA: Diagnosis not present

## 2023-12-23 ENCOUNTER — Encounter (HOSPITAL_BASED_OUTPATIENT_CLINIC_OR_DEPARTMENT_OTHER): Payer: Self-pay | Admitting: Pulmonary Disease

## 2023-12-23 ENCOUNTER — Other Ambulatory Visit: Payer: Self-pay

## 2023-12-23 ENCOUNTER — Ambulatory Visit (INDEPENDENT_AMBULATORY_CARE_PROVIDER_SITE_OTHER): Admitting: Pulmonary Disease

## 2023-12-23 VITALS — BP 120/74 | HR 88 | Ht 73.0 in | Wt 183.0 lb

## 2023-12-23 DIAGNOSIS — J84112 Idiopathic pulmonary fibrosis: Secondary | ICD-10-CM

## 2023-12-23 DIAGNOSIS — J9611 Chronic respiratory failure with hypoxia: Secondary | ICD-10-CM

## 2023-12-23 DIAGNOSIS — J849 Interstitial pulmonary disease, unspecified: Secondary | ICD-10-CM

## 2023-12-23 MED ORDER — ALBUTEROL SULFATE (2.5 MG/3ML) 0.083% IN NEBU: 2.5000 mg | INHALATION_SOLUTION | Freq: Four times a day (QID) | RESPIRATORY_TRACT | 3 refills | Status: DC | PRN

## 2023-12-23 MED ORDER — PIRFENIDONE 267 MG PO TABS
801.0000 mg | ORAL_TABLET | Freq: Three times a day (TID) | ORAL | 3 refills | Status: DC
Start: 2023-12-23 — End: 2023-12-23

## 2023-12-23 MED ORDER — UMECLIDINIUM-VILANTEROL 62.5-25 MCG/ACT IN AEPB: 1.0000 | INHALATION_SPRAY | Freq: Every day | RESPIRATORY_TRACT | 3 refills | Status: DC

## 2023-12-23 MED ORDER — PIRFENIDONE 267 MG PO TABS: 801.0000 mg | ORAL_TABLET | Freq: Three times a day (TID) | ORAL | 3 refills | Status: AC

## 2023-12-23 NOTE — Progress Notes (Addendum)
 Subjective:    Patient ID: Marc Gilbert, male    DOB: [birth year], 89 y.o.   MRN: [id number]  89 yo for follow-up of IPF and chronic hypoxic respiratory failure  -2L at rest & upto 6L with exercise He smoked 3/4 pack/day until he quit in 1995, about 30 pack years -did not tolerate Ofev       PMH : chronic diastolic heart failure,  paroxysmal atrial fibrillation, status post ablation in 08/10/2022 on Eliquis , history of coronary artery disease status post CABG,  chronic kidney see stage IIIa, hypothyroidism   Discussed the use of AI scribe software for clinical note transcription with the patient, who gave verbal consent to proceed.  History of Present Illness Marc Gilbert is a 89 year old male with interstitial lung disease who presents with increased shortness of breath.  Over the past two months, he experiences a gradual increase in shortness of breath. Oxygen  levels drop below 80 during exercise, even with supplemental oxygen . He uses more oxygen  than before and adjusts his portable oxygen  concentrator during activities. He takes pirfenidone  for interstitial lung disease and is scheduled for regular blood tests to monitor for side effects. He uses Incruse Ellipta once daily, but plans to switch back to Spiriva due to prescription plan changes. He uses a nebulizer with albuterol  twice daily, which he finds sufficient. He monitors his oxygen  levels during activities and adjusts exertion to maintain saturation between 88 and 91. He exercises regularly, including strength training, and paces himself accordingly.     Significant tests/ events reviewed   02/2023 Initial OV On ambulation oxygen  saturation dropped on pulse O2 and required 6 L continuous to maintain about 88%   HRCT chest 01/2023 UIP pattern, progressive compared to 2020, chronic pancreatitis HRCT chest 03/2018 probable UIP pattern, no honeycombing   Echo 10/2022 RVSP 38, enlarged RV   PFTs 02/2023 severe restriction TLC  56%, FVC 75%, DLCO of 43%    Review of Systems  neg for any significant sore throat, dysphagia, itching, sneezing, nasal congestion or excess/ purulent secretions, fever, chills, sweats, unintended wt loss, pleuritic or exertional cp, hempoptysis, orthopnea pnd or change in chronic leg swelling. Also denies presyncope, palpitations, heartburn, abdominal pain, nausea, vomiting, diarrhea or change in bowel or urinary habits, dysuria,hematuria, rash, arthralgias, visual complaints, headache, numbness weakness or ataxia.      Objective:   Physical Exam  Gen. Pleasant, well-nourished, in no distress ENT - no thrush, no pallor/icterus,no post nasal drip Neck: No JVD, no thyromegaly, no carotid bruits Lungs: no use of accessory muscles, no dullness to percussion, BB rales  Cardiovascular: Rhythm regular, heart sounds  normal, no murmurs or gallops, no peripheral edema Musculoskeletal: No deformities, no cyanosis or clubbing        Assessment & Plan:   Assessment and Plan Assessment & Plan Idiopathic pulmonary fibrosis with progressive hypoxemia Progressive hypoxemia with increased oxygen  requirement during exertion. Oxygen  saturation drops below 80% during exercise. Current oxygen  therapy is insufficient for exertional needs. He prefers quality of life over increased activity with a tank. New medication with similar side effects to previous intolerable medication discussed. - Continue current oxygen  therapy regimen. - Encouraged use of continuous oxygen  during exertion. - Discussed potential future need for increased oxygen  requirements. -HRCT next visit   Chronic resp failure with hypoxia Management of supplemental oxygen  therapy Current oxygen  therapy is inadequate for exertional needs. He is resistant to using a tank but understands the need for continuous oxygen  during exercise.  He is learning to manage oxygen  levels during activities. - Encouraged use of continuous oxygen  during  exertion. - Discussed potential future need for increased oxygen  requirements.  COPD -although he does not have this strictly speaking, albuterol  seems to help and we will continue  Chronic therapy with pirfenidone  for idiopathic pulmonary fibrosis Pirfenidone  is a lifelong medication to slow disease progression. New medication with similar side effects to previous intolerable medication discussed. - Continue pirfenidone  therapy. - Monitor LFTS q 3 months & side effects and adjust as necessary.  Chronic inhaled bronchodilator therapy (Anoro, Spiriva, Incruse) for pulmonary fibrosis Current inhaler therapy with Incruse is effective. [facility] requires a switch to Spiriva or a combination inhaler. He prefers to switch to a combination inhaler for additional benefits. - Prescribed Anoro as a combination inhaler. - Ensured prescription is sent to [facility] for a three-month supply.  Chronic nebulized albuterol  therapy for pulmonary fibrosis Nebulized albuterol  is used twice daily as needed for shortness of breath. He is running low on medication and requires a refill. - Prescribed albuterol  nebulizer for twice daily use. - Ensured prescription is sent to [facility] for a three-month supply.

## 2023-12-23 NOTE — Progress Notes (Signed)
 Pirfenidone  Rx was sent to incorrect pharmacy. Sending pirfenidone  Rx to correct pharmacy, [facility].

## 2023-12-23 NOTE — Patient Instructions (Signed)
  VISIT SUMMARY: You came in today due to increased shortness of breath over the past two months. We discussed your current treatments and made some adjustments to better manage your symptoms and improve your quality of life.  YOUR PLAN: -IDIOPATHIC PULMONARY FIBROSIS WITH PROGRESSIVE HYPOXEMIA: Idiopathic pulmonary fibrosis is a lung disease that causes scarring of the lungs, leading to difficulty breathing. Your oxygen  levels drop significantly during exercise, so we encourage you to use continuous oxygen  during exertion. We also discussed the potential need for increased oxygen  in the future.  -MANAGEMENT OF SUPPLEMENTAL OXYGEN  THERAPY: Your current oxygen  therapy is not meeting your needs during physical activity. We recommend you use continuous oxygen  during exertion and be prepared for the possibility of needing more oxygen  in the future.  -CHRONIC THERAPY WITH PIRFENIDONE  FOR IDIOPATHIC PULMONARY FIBROSIS: Pirfenidone  is a medication that helps slow the progression of lung scarring. You should continue taking it and monitor for any side effects.  -CHRONIC INHALED BRONCHODILATOR THERAPY FOR PULMONARY FIBROSIS: Inhaled bronchodilators help open your airways. We have switched your prescription to Anoro, a combination inhaler, to meet your prescription plan requirements and provide additional benefits.  -CHRONIC NEBULIZED ALBUTEROL  THERAPY FOR PULMONARY FIBROSIS: Albuterol  helps relieve shortness of breath. Continue using your nebulizer twice daily as needed. We have sent a prescription for a three-month supply to [facility].  INSTRUCTIONS: Please continue with your current oxygen  therapy regimen and use continuous oxygen  during exertion. Keep taking pirfenidone  and monitor for side effects. Use the new Anoro inhaler as prescribed and continue with your nebulized albuterol  twice daily. Follow up with regular blood tests to monitor for side effects of pirfenidone . If you notice any changes in  your symptoms or have concerns, please contact our office.                      Contains text generated by Abridge.                                 Contains text generated by Abridge.

## 2023-12-23 NOTE — Addendum Note (Signed)
 Addended by: ALINDA WINN KIDD on: 12/23/2023 11:42 AM   Modules accepted: Orders

## 2023-12-24 ENCOUNTER — Encounter (HOSPITAL_BASED_OUTPATIENT_CLINIC_OR_DEPARTMENT_OTHER): Payer: Self-pay | Admitting: Pulmonary Disease

## 2023-12-27 ENCOUNTER — Telehealth (HOSPITAL_BASED_OUTPATIENT_CLINIC_OR_DEPARTMENT_OTHER): Payer: Self-pay

## 2023-12-27 ENCOUNTER — Encounter (HOSPITAL_BASED_OUTPATIENT_CLINIC_OR_DEPARTMENT_OTHER): Payer: Self-pay | Admitting: Pulmonary Disease

## 2023-12-27 DIAGNOSIS — J849 Interstitial pulmonary disease, unspecified: Secondary | ICD-10-CM

## 2023-12-27 NOTE — Telephone Encounter (Signed)
 Copied from CRM #[id number]. Topic: Clinical - Medication Prior Auth >> Dec 24, 2023  2:41 PM Rilla B wrote: Reason for CRM: Patient calling stating he needs prior auth for albuterol  per [facility]. Patient also states per [facility] he has one other med that they will no longer fill starting in January. Please call patient [phone] (cell)

## 2023-12-27 NOTE — Telephone Encounter (Signed)
 Order placed

## 2023-12-27 NOTE — Telephone Encounter (Signed)
**Note De-identified  [place] Obfuscation** Please advise 

## 2023-12-29 NOTE — Telephone Encounter (Signed)
 Pt notified should be ran under his Part B

## 2023-12-30 ENCOUNTER — Other Ambulatory Visit (HOSPITAL_BASED_OUTPATIENT_CLINIC_OR_DEPARTMENT_OTHER): Payer: Self-pay

## 2023-12-30 ENCOUNTER — Telehealth: Payer: Self-pay

## 2023-12-30 ENCOUNTER — Telehealth (HOSPITAL_BASED_OUTPATIENT_CLINIC_OR_DEPARTMENT_OTHER): Payer: Self-pay

## 2023-12-30 DIAGNOSIS — J849 Interstitial pulmonary disease, unspecified: Secondary | ICD-10-CM

## 2023-12-30 NOTE — Telephone Encounter (Signed)
 Per Dr Jude will send to DME company to fill this med as they will fill under Part B, Order placed to them

## 2023-12-30 NOTE — Telephone Encounter (Signed)
 Copied from CRM #[id number]. Topic: Clinical - Medication Prior Auth >> Dec 24, 2023  2:41 PM Rilla B wrote: Reason for CRM: Patient calling stating he needs prior auth for albuterol  per [facility]. Patient also states per [facility] he has one other med that they will no longer fill starting in January. Please call patient [phone] (cell) >> Dec 30, 2023  2:14 PM Rozanna MATSU wrote: Pt is calling back about his albuterol  and stated [facility] advised he will need prior authorization please call this number [phone] to get this completed

## 2023-12-30 NOTE — Telephone Encounter (Signed)
 Copied from CRM #[id number]. Topic: Clinical - Order For Equipment >> Dec 30, 2023  2:28 PM Marc Gilbert wrote: Reason for CRM: Pt sent a mychart message on 11/10 requesting I now find that I will need an oxygen  tank capable of delivering 10 liters instead of the previous 6 liters that I suggested in my previous post on MyChart. I have found through previous exercise that my need for oxygen  increases from 6 to 10 liters. I hope then that the prescription for a Home Fill concentrator with a 10 liter tank is what Dr Jude would approve and send the prescription to [facility] in [facility].  From the pt's referrals, it appears this request was sent to [facility] which do not fill home fill concentrator orders for pts. They suggested this to be sent to [facility] instead.  Phone # - [phone] (Pt stated this is the [facility] office)  Fax Number - [phone]  Please follow up with the pt with a phone call to phone number [phone] ok to leave a vm.

## 2023-12-30 NOTE — Telephone Encounter (Signed)
 Copied from CRM #[id number]. Topic: Clinical - Medication Prior Auth >> Dec 24, 2023  2:41 PM Rilla B wrote: Reason for CRM: Patient calling stating he needs prior auth for albuterol  per [facility]. Patient also states per [facility] he has one other med that they will no longer fill starting in January. Please call patient [phone] (cell) >> Dec 30, 2023  2:14 PM Rozanna MATSU wrote: Pt is calling back about his albuterol  and stated [facility] advised he will need prior authorization please call this number [phone] to get this completed    message has been sent to PA team

## 2023-12-31 DIAGNOSIS — J84112 Idiopathic pulmonary fibrosis: Secondary | ICD-10-CM | POA: Diagnosis not present

## 2023-12-31 DIAGNOSIS — Z5181 Encounter for therapeutic drug level monitoring: Secondary | ICD-10-CM | POA: Diagnosis not present

## 2023-12-31 NOTE — Telephone Encounter (Signed)
 Pt does not have a hematologist this was his urologist his daughter wants you to decide if he should have this blood pulled off or not when we were looking back in his chart at his HGB levels this looks pretty normal for him since starting Oxygen . She would like you to advise if we can just recheck HGB and see where he is instead of putting him through this unnecessary procedure. She is going to reach out to his cardiologist to ask them if they think he needs a little more coag instead of just an ASA also but not as much as Eliquis  and will let us  know there response.

## 2023-12-31 NOTE — Telephone Encounter (Signed)
 Please advise Dr Jude on HGB decisions - I have already placed order for his Albuterol  through DME--His Albuterol  has to be billed through Medicare Part B his medical insurance so [facility] his pharmacy benefits provider will not fill it that way, I have sent the RX to DME to get this for him so insurance will cover NO PA required per our pharmacy team in previous telephone encounter.

## 2023-12-31 NOTE — Telephone Encounter (Signed)
 Spoke with patient to verify DME-patient states he needs a prior authorization for his Albuterol  Nebulizer Solution thru [facility].  He also states Dr. Devere (his urologist) had him do blood work and when the results came back, his hemoglobin was high and he wants to draw off a pint of blood.  Patient feels Dr. Jude should be involved in this decision.  Patient call back---[phone]

## 2024-01-02 ENCOUNTER — Encounter (HOSPITAL_BASED_OUTPATIENT_CLINIC_OR_DEPARTMENT_OTHER): Payer: Self-pay | Admitting: Pulmonary Disease

## 2024-01-02 ENCOUNTER — Ambulatory Visit: Payer: Self-pay | Admitting: Cardiology

## 2024-01-02 DIAGNOSIS — J84112 Idiopathic pulmonary fibrosis: Secondary | ICD-10-CM

## 2024-01-02 DIAGNOSIS — J849 Interstitial pulmonary disease, unspecified: Secondary | ICD-10-CM

## 2024-01-03 ENCOUNTER — Other Ambulatory Visit (HOSPITAL_BASED_OUTPATIENT_CLINIC_OR_DEPARTMENT_OTHER): Payer: Self-pay

## 2024-01-03 DIAGNOSIS — J849 Interstitial pulmonary disease, unspecified: Secondary | ICD-10-CM

## 2024-01-03 DIAGNOSIS — C4442 Squamous cell carcinoma of skin of scalp and neck: Secondary | ICD-10-CM | POA: Diagnosis not present

## 2024-01-03 NOTE — Telephone Encounter (Signed)
 Please advise

## 2024-01-03 NOTE — Telephone Encounter (Signed)
 Pt daughter notified on VM (DPR)

## 2024-01-04 NOTE — Telephone Encounter (Signed)
 Referral order to [facility] was canceled on 11/17. A new referral order for 10L was resent to [facility]. We are currently waiting on their response and confirmation that the order is completed. Once received, it will be placed in the referral. Nothing further needed at this time.

## 2024-01-04 NOTE — Telephone Encounter (Signed)
 Do we want to just order CBC for him to be done at [facility] I can order and go ahead and fax if that is all that is needed. Fax number [phone] phone number [phone]

## 2024-01-05 ENCOUNTER — Telehealth (HOSPITAL_BASED_OUTPATIENT_CLINIC_OR_DEPARTMENT_OTHER): Payer: Self-pay

## 2024-01-05 ENCOUNTER — Other Ambulatory Visit (HOSPITAL_BASED_OUTPATIENT_CLINIC_OR_DEPARTMENT_OTHER): Payer: Self-pay

## 2024-01-05 DIAGNOSIS — J849 Interstitial pulmonary disease, unspecified: Secondary | ICD-10-CM

## 2024-01-05 DIAGNOSIS — J84112 Idiopathic pulmonary fibrosis: Secondary | ICD-10-CM

## 2024-01-05 NOTE — Telephone Encounter (Signed)
 New order placed    Copied from CRM [id number]. Topic: Clinical - Order For Equipment >> Jan 05, 2024 11:59 AM Lavanda D wrote: Reason for CRM: Patient's daughter calling about his order for Oxygen : Problems with rx to [facility], she said that his oxygen  order needs to be sent to [facility] since they supply home fill tanks and [facility] does not. At his appt on the 6th he was resistant to the home tanks but has now changed his mind. She said that he needs the 10L for continuous use and the home fill tanks as well. She spoke with [facility] and they have no record of the order.   She would like to speak with [facility] @ [facility]#: Dr. Macario Sharps (daughter) [phone]

## 2024-01-05 NOTE — Telephone Encounter (Signed)
 Labs ordered will fax tomorrow to [facility] from [facility]

## 2024-01-05 NOTE — Telephone Encounter (Signed)
 Community message sent to [facility] to find out as Rx I sent has QTY and directions     Copied from CRM [id number]. Topic: Clinical - Prescription Issue >> Jan 05, 2024 12:01 PM Lavanda D wrote: Reason for CRM: Patients daughter is calling about her fathers Albuterol : [facility] also called Dr. Claudene about the albuterol  and said that the rx that they have for him does not specify quantity/usage/instructions. They also need his most recent progress notes in order to fill.

## 2024-01-06 ENCOUNTER — Other Ambulatory Visit (HOSPITAL_BASED_OUTPATIENT_CLINIC_OR_DEPARTMENT_OTHER): Payer: Self-pay

## 2024-01-06 ENCOUNTER — Telehealth (HOSPITAL_BASED_OUTPATIENT_CLINIC_OR_DEPARTMENT_OTHER): Payer: Self-pay

## 2024-01-06 DIAGNOSIS — Z0000 Encounter for general adult medical examination without abnormal findings: Secondary | ICD-10-CM | POA: Diagnosis not present

## 2024-01-06 DIAGNOSIS — K5901 Slow transit constipation: Secondary | ICD-10-CM | POA: Diagnosis not present

## 2024-01-06 DIAGNOSIS — I700 Atherosclerosis of aorta: Secondary | ICD-10-CM | POA: Diagnosis not present

## 2024-01-06 DIAGNOSIS — D751 Secondary polycythemia: Secondary | ICD-10-CM | POA: Diagnosis not present

## 2024-01-06 DIAGNOSIS — I129 Hypertensive chronic kidney disease with stage 1 through stage 4 chronic kidney disease, or unspecified chronic kidney disease: Secondary | ICD-10-CM | POA: Diagnosis not present

## 2024-01-06 DIAGNOSIS — E7800 Pure hypercholesterolemia, unspecified: Secondary | ICD-10-CM | POA: Diagnosis not present

## 2024-01-06 DIAGNOSIS — I484 Atypical atrial flutter: Secondary | ICD-10-CM | POA: Diagnosis not present

## 2024-01-06 DIAGNOSIS — E039 Hypothyroidism, unspecified: Secondary | ICD-10-CM | POA: Diagnosis not present

## 2024-01-06 DIAGNOSIS — N1831 Chronic kidney disease, stage 3a: Secondary | ICD-10-CM | POA: Diagnosis not present

## 2024-01-06 DIAGNOSIS — G8929 Other chronic pain: Secondary | ICD-10-CM | POA: Diagnosis not present

## 2024-01-06 DIAGNOSIS — F5101 Primary insomnia: Secondary | ICD-10-CM | POA: Diagnosis not present

## 2024-01-06 DIAGNOSIS — Z96642 Presence of left artificial hip joint: Secondary | ICD-10-CM | POA: Diagnosis not present

## 2024-01-06 DIAGNOSIS — H903 Sensorineural hearing loss, bilateral: Secondary | ICD-10-CM | POA: Diagnosis not present

## 2024-01-06 DIAGNOSIS — Z77090 Contact with and (suspected) exposure to asbestos: Secondary | ICD-10-CM | POA: Diagnosis not present

## 2024-01-06 DIAGNOSIS — Z1331 Encounter for screening for depression: Secondary | ICD-10-CM | POA: Diagnosis not present

## 2024-01-06 MED ORDER — ALBUTEROL SULFATE (2.5 MG/3ML) 0.083% IN NEBU: 2.5000 mg | INHALATION_SOLUTION | Freq: Four times a day (QID) | RESPIRATORY_TRACT | 3 refills | Status: AC | PRN

## 2024-01-06 NOTE — Addendum Note (Signed)
 Addended by: TRUDY WARREN CROME on: 01/06/2024 11:24 AM   Modules accepted: Orders

## 2024-01-06 NOTE — Telephone Encounter (Signed)
 Rx with Dx codes sent to [facility]     Copied from CRM [id number]. Topic: Clinical - Prescription Issue >> Jan 06, 2024 11:36 AM Nathanel DEL wrote: Reason for CRM: tina w/ [facility] states the codes for the  albuterol  (PROVENTIL ) (2.5 MG/3ML) 0.083% nebulizer solution Are not billable codes for his dx.  Need new dx codes (the billable codes are  J41.0- J70.9) Fax:[phone] Attn [place]  Cb [phone]

## 2024-01-06 NOTE — Telephone Encounter (Signed)
 Order faxed.

## 2024-01-07 ENCOUNTER — Other Ambulatory Visit (HOSPITAL_BASED_OUTPATIENT_CLINIC_OR_DEPARTMENT_OTHER): Payer: Self-pay

## 2024-01-07 MED ORDER — UMECLIDINIUM-VILANTEROL 62.5-25 MCG/ACT IN AEPB: 1.0000 | INHALATION_SPRAY | Freq: Every day | RESPIRATORY_TRACT | 3 refills | Status: AC

## 2024-01-17 ENCOUNTER — Other Ambulatory Visit: Payer: Self-pay | Admitting: Cardiology

## 2024-01-21 ENCOUNTER — Ambulatory Visit (HOSPITAL_BASED_OUTPATIENT_CLINIC_OR_DEPARTMENT_OTHER): Admitting: Pulmonary Disease

## 2024-01-25 ENCOUNTER — Ambulatory Visit (HOSPITAL_BASED_OUTPATIENT_CLINIC_OR_DEPARTMENT_OTHER)

## 2024-02-02 ENCOUNTER — Telehealth (HOSPITAL_BASED_OUTPATIENT_CLINIC_OR_DEPARTMENT_OTHER): Payer: Self-pay

## 2024-02-02 NOTE — Telephone Encounter (Signed)
 Spoke with Dena from [facility], who confirmed they have received the referral order for home fill O2. Brianna explained that [facility] has new staff and that the referral was initially linked incorrectly due to coordination issues with the outside referral team for [facility]. She contacted the Purchasing Department, which has scheduled pickup of Mr. Navarrette old oxygen  concentrator and confirmed delivery of the new equipment on Friday, 12/19. Dena also stated that nothing further is needed at this time for the referral order from our office.  I then contacted Mr. Kiraly daughter, Macario, to provide an update. She was appreciative of the information but expressed understandable frustration, as it has been nearly a month since referral order was sent to [facility] for patient. I informed her that Dena from [facility] was sending an email during our call, which Macario confirmed she received with the same updated information I provided. She thanked me and stated she would reach out if anything further is needed. Nothing further needed at this time.

## 2024-02-02 NOTE — Telephone Encounter (Signed)
 Copied from CRM #[id number]. Topic: Clinical - Order For Equipment >> Feb 01, 2024  4:42 PM Lavanda D wrote: Reason for CRM: Patient's daugher Marc Gilbert is calling to speak with either [employer] the case production designer, [employer]tele[employer] or [facility]. She said this is in regards to his home fill oxygen  that they have been trying to get since 2 weeks prior to Thanksgiving. [facility] is saying that they still do not have an order. She said she is frustrated due to reaching out to the clinic and being told it has been sent, then reaching out to [facility] and they state that they don't have an order. She was told that an emailed copy of the order was sent to [facility] from [facility] but the orientation of the scanned document was messed up so the whole order was not visible so it will need to be resent in order for them to fill the rx. She is requesting a callback @ her number, she said not to reach out to her father since the situation is a bit complicated for him to handle. She is insistent on touching base with someone via the phone.

## 2024-03-17 ENCOUNTER — Other Ambulatory Visit: Payer: Self-pay | Admitting: Cardiology

## 2024-03-21 ENCOUNTER — Telehealth: Payer: Self-pay | Admitting: Radiology

## 2024-03-21 ENCOUNTER — Other Ambulatory Visit: Payer: Self-pay | Admitting: Physical Medicine and Rehabilitation

## 2024-03-21 DIAGNOSIS — G8929 Other chronic pain: Secondary | ICD-10-CM

## 2024-03-21 DIAGNOSIS — M47816 Spondylosis without myelopathy or radiculopathy, lumbar region: Secondary | ICD-10-CM

## 2024-03-24 ENCOUNTER — Ambulatory Visit (HOSPITAL_BASED_OUTPATIENT_CLINIC_OR_DEPARTMENT_OTHER): Admission: RE | Admit: 2024-03-24 | Source: Ambulatory Visit

## 2024-03-24 DIAGNOSIS — J849 Interstitial pulmonary disease, unspecified: Secondary | ICD-10-CM

## 2024-03-27 ENCOUNTER — Ambulatory Visit (HOSPITAL_BASED_OUTPATIENT_CLINIC_OR_DEPARTMENT_OTHER): Admitting: Pulmonary Disease

## 2024-04-10 ENCOUNTER — Encounter: Admitting: Physical Medicine and Rehabilitation

## 2024-04-12 ENCOUNTER — Encounter: Admitting: Physical Medicine and Rehabilitation
